# Patient Record
Sex: Female | Born: 1965
Health system: Southern US, Community
[De-identification: ages and names within clinical notes are randomized; demographics above are authoritative.]

## PROBLEM LIST (undated history)

## (undated) DIAGNOSIS — R7303 Prediabetes: Secondary | ICD-10-CM

## (undated) DIAGNOSIS — F319 Bipolar disorder, unspecified: Secondary | ICD-10-CM

## (undated) DIAGNOSIS — C801 Malignant (primary) neoplasm, unspecified: Secondary | ICD-10-CM

## (undated) DIAGNOSIS — F909 Attention-deficit hyperactivity disorder, unspecified type: Secondary | ICD-10-CM

## (undated) DIAGNOSIS — K289 Gastrojejunal ulcer, unspecified as acute or chronic, without hemorrhage or perforation: Secondary | ICD-10-CM

## (undated) DIAGNOSIS — N809 Endometriosis, unspecified: Secondary | ICD-10-CM

## (undated) DIAGNOSIS — C8190 Hodgkin lymphoma, unspecified, unspecified site: Secondary | ICD-10-CM

## (undated) DIAGNOSIS — F32A Depression, unspecified: Secondary | ICD-10-CM

## (undated) DIAGNOSIS — Z9884 Bariatric surgery status: Secondary | ICD-10-CM

## (undated) DIAGNOSIS — F419 Anxiety disorder, unspecified: Secondary | ICD-10-CM

## (undated) DIAGNOSIS — E039 Hypothyroidism, unspecified: Secondary | ICD-10-CM

## (undated) DIAGNOSIS — K219 Gastro-esophageal reflux disease without esophagitis: Secondary | ICD-10-CM

## (undated) DIAGNOSIS — F329 Major depressive disorder, single episode, unspecified: Secondary | ICD-10-CM

## (undated) DIAGNOSIS — F17200 Nicotine dependence, unspecified, uncomplicated: Secondary | ICD-10-CM

## (undated) HISTORY — DX: Anxiety disorder, unspecified: F41.9

## (undated) HISTORY — DX: Endometriosis, unspecified: N80.9

## (undated) HISTORY — PX: CARPAL TUNNEL RELEASE: SHX101

## (undated) HISTORY — PX: TONSILLECTOMY: SUR1361

## (undated) HISTORY — DX: Depression, unspecified: F32.A

## (undated) HISTORY — DX: Hypothyroidism, unspecified: E03.9

## (undated) HISTORY — DX: Attention-deficit hyperactivity disorder, unspecified type: F90.9

## (undated) HISTORY — PX: BUNIONECTOMY: SHX129

## (undated) HISTORY — PX: ABDOMINAL HYSTERECTOMY: SHX81

## (undated) HISTORY — DX: Major depressive disorder, single episode, unspecified: F32.9

## (undated) HISTORY — DX: Bipolar disorder, unspecified: F31.9

## (undated) HISTORY — PX: COLONOSCOPY: SHX174

## (undated) HISTORY — DX: Nicotine dependence, unspecified, uncomplicated: F17.200

## (undated) HISTORY — DX: Hodgkin lymphoma, unspecified, unspecified site: C81.90

## (undated) HISTORY — DX: Bariatric surgery status: Z98.84

## (undated) HISTORY — DX: Gastrojejunal ulcer, unspecified as acute or chronic, without hemorrhage or perforation: K28.9

---

## 1998-06-11 DIAGNOSIS — C8190 Hodgkin lymphoma, unspecified, unspecified site: Secondary | ICD-10-CM

## 1998-06-11 HISTORY — DX: Hodgkin lymphoma, unspecified, unspecified site: C81.90

## 1998-09-10 HISTORY — PX: OTHER SURGICAL HISTORY: SHX169

## 2005-04-12 ENCOUNTER — Other Ambulatory Visit: Admission: RE | Admit: 2005-04-12 | Discharge: 2005-04-12 | Payer: Self-pay | Admitting: Gynecology

## 2006-06-11 HISTORY — PX: LAPAROSCOPIC SUPRACERVICAL HYSTERECTOMY: SUR797

## 2006-07-24 ENCOUNTER — Ambulatory Visit: Payer: Self-pay | Admitting: Internal Medicine

## 2006-07-24 ENCOUNTER — Ambulatory Visit: Payer: Self-pay | Admitting: Family Medicine

## 2006-07-31 ENCOUNTER — Ambulatory Visit: Payer: Self-pay | Admitting: Gastroenterology

## 2006-08-10 ENCOUNTER — Ambulatory Visit: Payer: Self-pay | Admitting: Internal Medicine

## 2006-09-10 ENCOUNTER — Ambulatory Visit: Payer: Self-pay | Admitting: Internal Medicine

## 2006-09-20 ENCOUNTER — Ambulatory Visit: Payer: Self-pay | Admitting: Unknown Physician Specialty

## 2006-09-26 ENCOUNTER — Ambulatory Visit: Payer: Self-pay | Admitting: Unknown Physician Specialty

## 2006-10-10 ENCOUNTER — Ambulatory Visit: Payer: Self-pay | Admitting: Internal Medicine

## 2006-11-08 DIAGNOSIS — R748 Abnormal levels of other serum enzymes: Secondary | ICD-10-CM | POA: Insufficient documentation

## 2006-11-08 DIAGNOSIS — F339 Major depressive disorder, recurrent, unspecified: Secondary | ICD-10-CM | POA: Insufficient documentation

## 2006-11-10 ENCOUNTER — Ambulatory Visit: Payer: Self-pay | Admitting: Internal Medicine

## 2006-11-21 ENCOUNTER — Ambulatory Visit: Payer: Self-pay | Admitting: Internal Medicine

## 2006-12-10 ENCOUNTER — Ambulatory Visit: Payer: Self-pay | Admitting: Internal Medicine

## 2006-12-20 ENCOUNTER — Ambulatory Visit: Payer: Self-pay | Admitting: Internal Medicine

## 2007-01-10 ENCOUNTER — Ambulatory Visit: Payer: Self-pay | Admitting: Internal Medicine

## 2007-01-20 ENCOUNTER — Ambulatory Visit: Payer: Self-pay | Admitting: Gastroenterology

## 2007-03-12 ENCOUNTER — Ambulatory Visit: Payer: Self-pay | Admitting: Internal Medicine

## 2007-03-28 ENCOUNTER — Ambulatory Visit: Payer: Self-pay | Admitting: Internal Medicine

## 2007-04-12 ENCOUNTER — Ambulatory Visit: Payer: Self-pay | Admitting: Internal Medicine

## 2007-05-14 ENCOUNTER — Emergency Department: Payer: Self-pay | Admitting: Internal Medicine

## 2007-06-06 ENCOUNTER — Ambulatory Visit: Payer: Self-pay | Admitting: Family Medicine

## 2007-07-28 ENCOUNTER — Ambulatory Visit: Payer: Self-pay | Admitting: Internal Medicine

## 2007-08-10 ENCOUNTER — Ambulatory Visit: Payer: Self-pay | Admitting: Internal Medicine

## 2007-09-10 ENCOUNTER — Ambulatory Visit: Payer: Self-pay | Admitting: Internal Medicine

## 2007-10-10 ENCOUNTER — Ambulatory Visit: Payer: Self-pay | Admitting: Internal Medicine

## 2007-10-27 ENCOUNTER — Ambulatory Visit: Payer: Self-pay | Admitting: Unknown Physician Specialty

## 2007-10-27 ENCOUNTER — Other Ambulatory Visit: Payer: Self-pay

## 2007-11-06 ENCOUNTER — Ambulatory Visit: Payer: Self-pay | Admitting: Unknown Physician Specialty

## 2007-11-10 ENCOUNTER — Ambulatory Visit: Payer: Self-pay | Admitting: Internal Medicine

## 2007-12-10 ENCOUNTER — Ambulatory Visit: Payer: Self-pay | Admitting: Internal Medicine

## 2007-12-24 ENCOUNTER — Ambulatory Visit: Payer: Self-pay | Admitting: Internal Medicine

## 2007-12-31 ENCOUNTER — Ambulatory Visit: Payer: Self-pay | Admitting: Internal Medicine

## 2008-01-10 ENCOUNTER — Ambulatory Visit: Payer: Self-pay | Admitting: Internal Medicine

## 2008-03-24 DIAGNOSIS — G4700 Insomnia, unspecified: Secondary | ICD-10-CM | POA: Insufficient documentation

## 2008-05-11 ENCOUNTER — Ambulatory Visit: Payer: Self-pay | Admitting: Unknown Physician Specialty

## 2008-12-30 IMAGING — NM NUCLEAR MEDICINE WHOLE BODY BONE SCINTIGRAPHY
2 series · 10 of 10 positions shown · non-contrast
Comparison: none

REASON FOR EXAM: hx Hodgkins lymphoma   elevated alkpnos   eval bone
metastatic disease
COMMENTS:

[Series 1: statics · 2.40mm/px · 4 acquisitions, 8 frames shown]
[im 1/4]
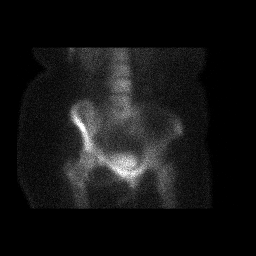
[im 1/4]
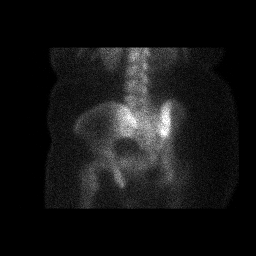
[im 2/4]
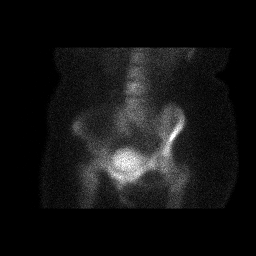
[im 2/4]
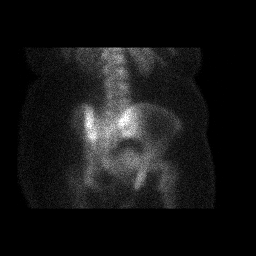
[im 3/4]
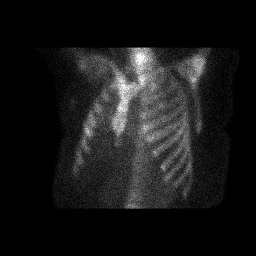
[im 3/4]
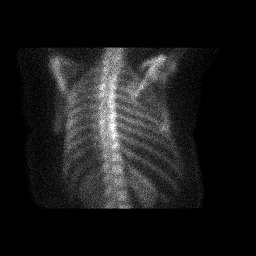
[im 4/4]
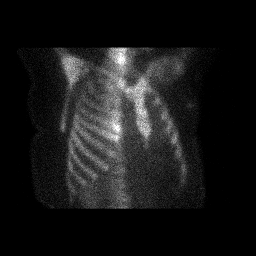
[im 4/4]
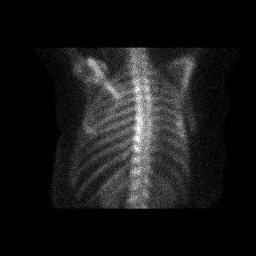

[Series 1: 3 hr wholebody · 2.40mm/px · 2 of 2 frames shown]
[frame 1/2]
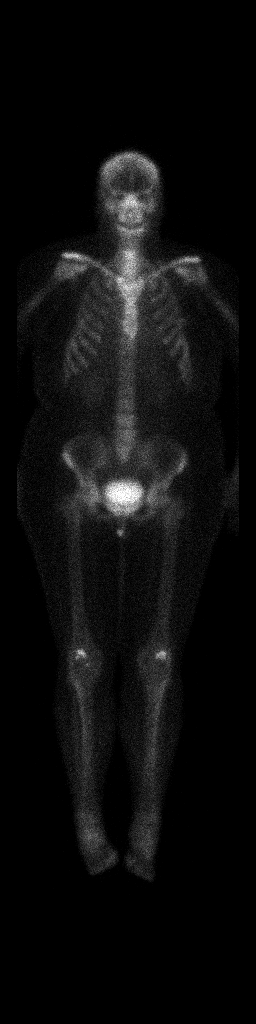
[frame 2/2]
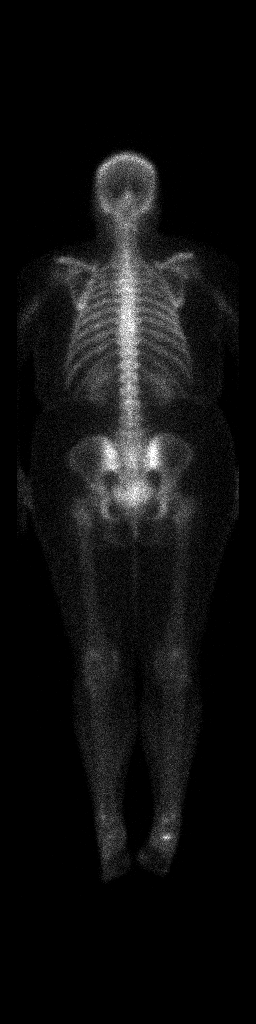

[10 of 10 positions shown; findings below may reference images not displayed]

PROCEDURE:     NM  - NM BONE WB 3 HR [DATE]  [DATE]

RESULT:     The patient received an injection of 22.48 mCi of technetium 99m
MDP for the study. Anterior and posterior images are obtained. Is no
evidence of abnormal localization. Mild degenerative changes are noted in
the ankles, worse on the right, as well as in the knees and shoulders.
IMPRESSION: Mild to degenerative changes. No other abnormal
localization evident.

## 2010-01-27 ENCOUNTER — Emergency Department: Payer: Self-pay | Admitting: Unknown Physician Specialty

## 2010-05-01 ENCOUNTER — Emergency Department: Payer: Self-pay | Admitting: Emergency Medicine

## 2010-06-11 DIAGNOSIS — Z9884 Bariatric surgery status: Secondary | ICD-10-CM

## 2010-06-11 HISTORY — DX: Bariatric surgery status: Z98.84

## 2010-07-19 ENCOUNTER — Ambulatory Visit: Payer: Self-pay | Admitting: General Practice

## 2010-08-17 ENCOUNTER — Ambulatory Visit: Payer: Self-pay | Admitting: Otolaryngology

## 2010-10-28 ENCOUNTER — Other Ambulatory Visit: Payer: Self-pay | Admitting: Physician Assistant

## 2010-11-28 ENCOUNTER — Other Ambulatory Visit: Payer: Self-pay | Admitting: Physician Assistant

## 2010-12-23 ENCOUNTER — Other Ambulatory Visit: Payer: Self-pay | Admitting: General Practice

## 2011-01-23 ENCOUNTER — Emergency Department: Payer: Self-pay | Admitting: Emergency Medicine

## 2011-03-12 HISTORY — PX: GASTRIC BYPASS: SHX52

## 2011-04-18 ENCOUNTER — Emergency Department: Payer: Self-pay

## 2011-07-12 ENCOUNTER — Ambulatory Visit: Payer: Self-pay | Admitting: Specialist

## 2011-07-12 DIAGNOSIS — I10 Essential (primary) hypertension: Secondary | ICD-10-CM

## 2011-07-18 ENCOUNTER — Ambulatory Visit: Payer: Self-pay | Admitting: Specialist

## 2011-08-07 ENCOUNTER — Ambulatory Visit: Payer: Self-pay | Admitting: Specialist

## 2011-10-08 ENCOUNTER — Ambulatory Visit: Payer: Self-pay | Admitting: Bariatrics

## 2011-10-10 ENCOUNTER — Ambulatory Visit: Payer: Self-pay | Admitting: Bariatrics

## 2011-10-24 ENCOUNTER — Ambulatory Visit: Payer: Self-pay | Admitting: Bariatrics

## 2011-10-24 LAB — PROTIME-INR
INR: 1
Prothrombin Time: 13.2 secs (ref 11.5–14.7)

## 2011-10-24 LAB — COMPREHENSIVE METABOLIC PANEL
Albumin: 3.4 g/dL (ref 3.4–5.0)
Alkaline Phosphatase: 172 U/L — ABNORMAL HIGH (ref 50–136)
Anion Gap: 6 — ABNORMAL LOW (ref 7–16)
BUN: 10 mg/dL (ref 7–18)
Bilirubin,Total: 0.5 mg/dL (ref 0.2–1.0)
Calcium, Total: 8.7 mg/dL (ref 8.5–10.1)
Chloride: 104 mmol/L (ref 98–107)
Co2: 28 mmol/L (ref 21–32)
EGFR (Non-African Amer.): >60
Osmolality: 274 (ref 275–301)
SGOT(AST): 23 U/L (ref 15–37)
SGPT (ALT): 32 U/L
Sodium: 138 mmol/L (ref 136–145)

## 2011-10-24 LAB — BILIRUBIN, DIRECT: Bilirubin, Direct: 0.1 mg/dL (ref 0.00–0.20)

## 2011-10-24 LAB — IRON AND TIBC
Iron Saturation: 29 %
Iron: 86 ug/dL (ref 50–170)

## 2011-10-24 LAB — FERRITIN: Ferritin (ARMC): 55 ng/mL (ref 8–388)

## 2011-10-24 LAB — CBC WITH DIFFERENTIAL/PLATELET
Basophil #: 0.1 10*3/uL (ref 0.0–0.1)
HGB: 12.8 g/dL (ref 12.0–16.0)
MCHC: 32.9 g/dL (ref 32.0–36.0)
MCV: 90 fL (ref 80–100)
Neutrophil %: 56.9 %
Platelet: 328 10*3/uL (ref 150–440)
RDW: 13.2 % (ref 11.5–14.5)

## 2011-10-24 LAB — HEMOGLOBIN A1C: Hemoglobin A1C: 6.3 % (ref 4.2–6.3)

## 2011-10-25 LAB — LIPASE, BLOOD: Lipase: 127 U/L (ref 73–393)

## 2011-11-10 ENCOUNTER — Ambulatory Visit: Payer: Self-pay | Admitting: Bariatrics

## 2011-12-10 ENCOUNTER — Ambulatory Visit: Payer: Self-pay | Admitting: Bariatrics

## 2011-12-31 ENCOUNTER — Emergency Department: Payer: Self-pay | Admitting: Emergency Medicine

## 2012-01-02 ENCOUNTER — Ambulatory Visit: Payer: Self-pay | Admitting: Bariatrics

## 2012-01-02 ENCOUNTER — Emergency Department: Payer: Self-pay | Admitting: Emergency Medicine

## 2012-01-10 ENCOUNTER — Ambulatory Visit: Payer: Self-pay | Admitting: Bariatrics

## 2012-02-10 ENCOUNTER — Ambulatory Visit: Payer: Self-pay | Admitting: Bariatrics

## 2012-03-04 ENCOUNTER — Ambulatory Visit: Payer: Self-pay | Admitting: Bariatrics

## 2012-03-04 LAB — BASIC METABOLIC PANEL
Calcium, Total: 9.2 mg/dL (ref 8.5–10.1)
Creatinine: 0.7 mg/dL (ref 0.60–1.30)
EGFR (African American): >60
EGFR (Non-African Amer.): >60
Glucose: 75 mg/dL (ref 65–99)
Osmolality: 274 (ref 275–301)
Sodium: 138 mmol/L (ref 136–145)

## 2012-03-04 LAB — MAGNESIUM: Magnesium: 1.8 mg/dL

## 2012-03-11 ENCOUNTER — Ambulatory Visit: Payer: Self-pay | Admitting: Bariatrics

## 2012-03-11 ENCOUNTER — Inpatient Hospital Stay: Payer: Self-pay | Admitting: Bariatrics

## 2012-03-12 LAB — CBC WITH DIFFERENTIAL/PLATELET
Eosinophil #: 0.1 10*3/uL (ref 0.0–0.7)
Eosinophil %: 0.9 %
HCT: 35.3 % (ref 35.0–47.0)
Lymphocyte #: 4.9 10*3/uL — ABNORMAL HIGH (ref 1.0–3.6)
MCHC: 34.4 g/dL (ref 32.0–36.0)
MCV: 90 fL (ref 80–100)
Monocyte #: 0.7 x10 3/mm (ref 0.2–0.9)
Neutrophil #: 6.4 10*3/uL (ref 1.4–6.5)
Neutrophil %: 53.2 %
Platelet: 254 10*3/uL (ref 150–440)

## 2012-03-12 LAB — BASIC METABOLIC PANEL
BUN: 11 mg/dL (ref 7–18)
Chloride: 109 mmol/L — ABNORMAL HIGH (ref 98–107)
Co2: 24 mmol/L (ref 21–32)
Creatinine: 0.73 mg/dL (ref 0.60–1.30)
Potassium: 3.9 mmol/L (ref 3.5–5.1)
Sodium: 142 mmol/L (ref 136–145)

## 2012-04-11 ENCOUNTER — Ambulatory Visit: Payer: Self-pay | Admitting: Bariatrics

## 2012-04-15 ENCOUNTER — Observation Stay: Payer: Self-pay | Admitting: Bariatrics

## 2012-04-15 LAB — CBC WITH DIFFERENTIAL/PLATELET
Basophil #: 0 10*3/uL (ref 0.0–0.1)
Eosinophil #: 0.2 10*3/uL (ref 0.0–0.7)
HCT: 39.3 % (ref 35.0–47.0)
HGB: 13.5 g/dL (ref 12.0–16.0)
Lymphocyte #: 5.6 10*3/uL — ABNORMAL HIGH (ref 1.0–3.6)
MCH: 31.2 pg (ref 26.0–34.0)
MCHC: 34.4 g/dL (ref 32.0–36.0)
MCV: 91 fL (ref 80–100)
Monocyte #: 0.6 x10 3/mm (ref 0.2–0.9)
Neutrophil #: 5.4 10*3/uL (ref 1.4–6.5)
RDW: 14.1 % (ref 11.5–14.5)

## 2012-04-15 LAB — COMPREHENSIVE METABOLIC PANEL
Alkaline Phosphatase: 147 U/L — ABNORMAL HIGH (ref 50–136)
BUN: 13 mg/dL (ref 7–18)
Bilirubin,Total: 0.5 mg/dL (ref 0.2–1.0)
Calcium, Total: 8.6 mg/dL (ref 8.5–10.1)
Chloride: 105 mmol/L (ref 98–107)
Co2: 27 mmol/L (ref 21–32)
Creatinine: 0.78 mg/dL (ref 0.60–1.30)
EGFR (Non-African Amer.): >60
Osmolality: 280 (ref 275–301)
SGPT (ALT): 34 U/L (ref 12–78)
Sodium: 140 mmol/L (ref 136–145)
Total Protein: 6.9 g/dL (ref 6.4–8.2)

## 2012-04-16 LAB — URINALYSIS, COMPLETE
Bilirubin,UR: NEGATIVE
Ketone: NEGATIVE
Protein: NEGATIVE
RBC,UR: <1 /HPF (ref 0–5)
Squamous Epithelial: 1

## 2012-05-11 ENCOUNTER — Ambulatory Visit: Payer: Self-pay | Admitting: Bariatrics

## 2012-06-05 ENCOUNTER — Ambulatory Visit: Payer: Self-pay | Admitting: Gastroenterology

## 2012-06-06 ENCOUNTER — Emergency Department: Payer: Self-pay | Admitting: Emergency Medicine

## 2012-06-06 LAB — URINALYSIS, COMPLETE
Nitrite: NEGATIVE
Ph: 7 (ref 4.5–8.0)
Protein: NEGATIVE
RBC,UR: 1 /HPF (ref 0–5)
Specific Gravity: 1.014 (ref 1.003–1.030)
Squamous Epithelial: 4

## 2012-06-06 LAB — CBC
HCT: 42.6 % (ref 35.0–47.0)
HGB: 13.6 g/dL (ref 12.0–16.0)
MCH: 29 pg (ref 26.0–34.0)
MCHC: 32 g/dL (ref 32.0–36.0)
MCV: 91 fL (ref 80–100)
RBC: 4.7 10*6/uL (ref 3.80–5.20)
WBC: 7.5 10*3/uL (ref 3.6–11.0)

## 2012-06-06 LAB — DIFFERENTIAL
Eosinophil %: 0.7 %
Lymphocyte #: 0.8 10*3/uL — ABNORMAL LOW (ref 1.0–3.6)
Monocyte %: 3.6 %
Neutrophil #: 6.3 10*3/uL (ref 1.4–6.5)

## 2012-06-06 LAB — COMPREHENSIVE METABOLIC PANEL
Alkaline Phosphatase: 218 U/L — ABNORMAL HIGH (ref 50–136)
Anion Gap: 8 (ref 7–16)
BUN: 9 mg/dL (ref 7–18)
Bilirubin,Total: 0.4 mg/dL (ref 0.2–1.0)
Calcium, Total: 8.6 mg/dL (ref 8.5–10.1)
Chloride: 108 mmol/L — ABNORMAL HIGH (ref 98–107)
Co2: 23 mmol/L (ref 21–32)
Creatinine: 0.77 mg/dL (ref 0.60–1.30)
EGFR (African American): >60
EGFR (Non-African Amer.): >60
Potassium: 3.6 mmol/L (ref 3.5–5.1)
SGPT (ALT): 18 U/L (ref 12–78)

## 2012-06-06 LAB — RAPID INFLUENZA A&B ANTIGENS

## 2012-06-30 ENCOUNTER — Other Ambulatory Visit: Payer: Self-pay | Admitting: Bariatrics

## 2012-06-30 LAB — CBC WITH DIFFERENTIAL/PLATELET
Basophil #: 0 10*3/uL (ref 0.0–0.1)
Basophil %: 0.3 %
Eosinophil #: 0.2 10*3/uL (ref 0.0–0.7)
HCT: 39.5 % (ref 35.0–47.0)
Lymphocyte %: 25.6 %
MCH: 30.5 pg (ref 26.0–34.0)
MCV: 90 fL (ref 80–100)
Monocyte #: 0.6 x10 3/mm (ref 0.2–0.9)
Monocyte %: 6.3 %
Neutrophil #: 6.6 10*3/uL — ABNORMAL HIGH (ref 1.4–6.5)
Neutrophil %: 66.1 %
Platelet: 255 10*3/uL (ref 150–440)
RDW: 13.8 % (ref 11.5–14.5)
WBC: 10 10*3/uL (ref 3.6–11.0)

## 2012-06-30 LAB — COMPREHENSIVE METABOLIC PANEL
Albumin: 3.2 g/dL — ABNORMAL LOW (ref 3.4–5.0)
Alkaline Phosphatase: 237 U/L — ABNORMAL HIGH (ref 50–136)
BUN: 13 mg/dL (ref 7–18)
Chloride: 108 mmol/L — ABNORMAL HIGH (ref 98–107)
Co2: 25 mmol/L (ref 21–32)
Creatinine: 0.73 mg/dL (ref 0.60–1.30)
EGFR (African American): >60
EGFR (Non-African Amer.): >60
Glucose: 86 mg/dL (ref 65–99)
SGOT(AST): 18 U/L (ref 15–37)
SGPT (ALT): 17 U/L (ref 12–78)

## 2012-06-30 LAB — PHOSPHORUS: Phosphorus: 2.5 mg/dL (ref 2.5–4.9)

## 2012-06-30 LAB — MAGNESIUM: Magnesium: 1.7 mg/dL — ABNORMAL LOW

## 2012-06-30 LAB — FOLATE: Folic Acid: 7.1 ng/mL (ref 3.1–100.0)

## 2012-06-30 LAB — FERRITIN: Ferritin (ARMC): 57 ng/mL (ref 8–388)

## 2012-08-21 ENCOUNTER — Ambulatory Visit: Payer: Self-pay | Admitting: Gastroenterology

## 2012-12-03 ENCOUNTER — Other Ambulatory Visit: Payer: Self-pay | Admitting: Bariatrics

## 2012-12-03 LAB — CBC WITH DIFFERENTIAL/PLATELET
Basophil %: 0.4 %
Eosinophil #: 0.2 10*3/uL (ref 0.0–0.7)
HGB: 13.6 g/dL (ref 12.0–16.0)
Lymphocyte #: 3.1 10*3/uL (ref 1.0–3.6)
Lymphocyte %: 41.6 %
MCH: 31.8 pg (ref 26.0–34.0)
MCHC: 35.4 g/dL (ref 32.0–36.0)
MCV: 90 fL (ref 80–100)
Monocyte #: 0.6 x10 3/mm (ref 0.2–0.9)
Monocyte %: 7.8 %
Neutrophil #: 3.6 10*3/uL (ref 1.4–6.5)
Neutrophil %: 47 %
Platelet: 223 10*3/uL (ref 150–440)
RBC: 4.29 10*6/uL (ref 3.80–5.20)
RDW: 13 % (ref 11.5–14.5)
WBC: 7.6 10*3/uL (ref 3.6–11.0)

## 2012-12-03 LAB — COMPREHENSIVE METABOLIC PANEL
Albumin: 3.3 g/dL — ABNORMAL LOW (ref 3.4–5.0)
Alkaline Phosphatase: 233 U/L — ABNORMAL HIGH (ref 50–136)
Anion Gap: 7 (ref 7–16)
Bilirubin,Total: 0.5 mg/dL (ref 0.2–1.0)
Chloride: 108 mmol/L — ABNORMAL HIGH (ref 98–107)
Co2: 26 mmol/L (ref 21–32)
Creatinine: 0.83 mg/dL (ref 0.60–1.30)
EGFR (African American): >60
EGFR (Non-African Amer.): >60
Osmolality: 281 (ref 275–301)
Potassium: 4.1 mmol/L (ref 3.5–5.1)
SGOT(AST): 17 U/L (ref 15–37)
SGPT (ALT): 26 U/L (ref 12–78)
Total Protein: 6.6 g/dL (ref 6.4–8.2)

## 2012-12-03 LAB — RETICULOCYTES
Absolute Retic Count: 0.0374 10*6/uL
Reticulocyte: 0.87 %

## 2012-12-03 LAB — IRON AND TIBC
Iron Bind.Cap.(Total): 260 ug/dL (ref 250–450)
Iron Saturation: 28 %
Iron: 74 ug/dL (ref 50–170)
Unbound Iron-Bind.Cap.: 186 ug/dL

## 2012-12-03 LAB — LIPASE, BLOOD: Lipase: 109 U/L (ref 73–393)

## 2012-12-03 LAB — MAGNESIUM: Magnesium: 1.7 mg/dL — ABNORMAL LOW

## 2012-12-03 LAB — AMYLASE: Amylase: 30 U/L (ref 25–115)

## 2012-12-03 LAB — FOLATE: Folic Acid: 9.4 ng/mL (ref 3.1–100.0)

## 2012-12-03 LAB — FERRITIN: Ferritin (ARMC): 51 ng/mL (ref 8–388)

## 2012-12-21 ENCOUNTER — Emergency Department: Payer: Self-pay | Admitting: Emergency Medicine

## 2012-12-21 LAB — URINALYSIS, COMPLETE
Bilirubin,UR: NEGATIVE
Leukocyte Esterase: NEGATIVE
Nitrite: POSITIVE
Ph: 6 (ref 4.5–8.0)
RBC,UR: 46 /HPF (ref 0–5)
Specific Gravity: 1.018 (ref 1.003–1.030)
Squamous Epithelial: 10
WBC UR: 8 /HPF (ref 0–5)

## 2012-12-22 LAB — URINE CULTURE

## 2012-12-23 ENCOUNTER — Other Ambulatory Visit: Payer: Self-pay | Admitting: Physician Assistant

## 2012-12-24 LAB — URINE CULTURE

## 2013-07-08 ENCOUNTER — Other Ambulatory Visit: Payer: Self-pay | Admitting: Bariatrics

## 2013-07-08 LAB — CBC WITH DIFFERENTIAL/PLATELET
Basophil #: 0 10*3/uL (ref 0.0–0.1)
Basophil %: 0.5 %
EOS ABS: 0.3 10*3/uL (ref 0.0–0.7)
Eosinophil %: 3.1 %
HCT: 40.8 % (ref 35.0–47.0)
HGB: 13.9 g/dL (ref 12.0–16.0)
LYMPHS ABS: 4.6 10*3/uL — AB (ref 1.0–3.6)
Lymphocyte %: 45.8 %
MCH: 32.5 pg (ref 26.0–34.0)
MCHC: 34.1 g/dL (ref 32.0–36.0)
MCV: 95 fL (ref 80–100)
MONO ABS: 0.6 x10 3/mm (ref 0.2–0.9)
Monocyte %: 6.2 %
NEUTROS PCT: 44.4 %
Neutrophil #: 4.4 10*3/uL (ref 1.4–6.5)
Platelet: 207 10*3/uL (ref 150–440)
RBC: 4.29 10*6/uL (ref 3.80–5.20)
RDW: 12.9 % (ref 11.5–14.5)
WBC: 9.9 10*3/uL (ref 3.6–11.0)

## 2013-07-08 LAB — PHOSPHORUS: Phosphorus: 3.9 mg/dL (ref 2.5–4.9)

## 2013-07-08 LAB — COMPREHENSIVE METABOLIC PANEL
ALBUMIN: 3.5 g/dL (ref 3.4–5.0)
AST: 11 U/L — AB (ref 15–37)
Alkaline Phosphatase: 186 U/L — ABNORMAL HIGH
Anion Gap: 4 — ABNORMAL LOW (ref 7–16)
BUN: 13 mg/dL (ref 7–18)
Bilirubin,Total: 0.4 mg/dL (ref 0.2–1.0)
Calcium, Total: 8.4 mg/dL — ABNORMAL LOW (ref 8.5–10.1)
Chloride: 106 mmol/L (ref 98–107)
Co2: 28 mmol/L (ref 21–32)
Creatinine: 0.75 mg/dL (ref 0.60–1.30)
EGFR (African American): >60
Glucose: 103 mg/dL — ABNORMAL HIGH (ref 65–99)
Osmolality: 276 (ref 275–301)
Potassium: 4.3 mmol/L (ref 3.5–5.1)
SGPT (ALT): 22 U/L (ref 12–78)
SODIUM: 138 mmol/L (ref 136–145)
Total Protein: 6.8 g/dL (ref 6.4–8.2)

## 2013-07-08 LAB — MAGNESIUM: MAGNESIUM: 1.7 mg/dL — AB

## 2013-07-08 LAB — LIPID PANEL
Cholesterol: 121 mg/dL (ref 0–200)
HDL: 42 mg/dL (ref 40–60)
LDL CHOLESTEROL, CALC: 53 mg/dL (ref 0–100)
Triglycerides: 132 mg/dL (ref 0–200)
VLDL Cholesterol, Calc: 26 mg/dL (ref 5–40)

## 2013-07-08 LAB — FERRITIN: Ferritin (ARMC): 41 ng/mL (ref 8–388)

## 2013-07-08 LAB — FOLATE: Folic Acid: 29 ng/mL (ref 3.1–100.0)

## 2013-07-08 LAB — AMYLASE: Amylase: 32 U/L (ref 25–115)

## 2014-03-03 ENCOUNTER — Other Ambulatory Visit: Payer: Self-pay | Admitting: Family Medicine

## 2014-03-03 LAB — CBC WITH DIFFERENTIAL/PLATELET
Basophil #: 0.1 10*3/uL (ref 0.0–0.1)
Basophil %: 0.7 %
Eosinophil #: 0.3 10*3/uL (ref 0.0–0.7)
Eosinophil %: 3.2 %
HCT: 44.6 % (ref 35.0–47.0)
HGB: 15 g/dL (ref 12.0–16.0)
Lymphocyte #: 4.3 10*3/uL — ABNORMAL HIGH (ref 1.0–3.6)
Lymphocyte %: 43.3 %
MCH: 32.2 pg (ref 26.0–34.0)
MCHC: 33.6 g/dL (ref 32.0–36.0)
MCV: 96 fL (ref 80–100)
Monocyte #: 0.6 x10 3/mm (ref 0.2–0.9)
Monocyte %: 6.3 %
NEUTROS ABS: 4.6 10*3/uL (ref 1.4–6.5)
Neutrophil %: 46.5 %
Platelet: 257 10*3/uL (ref 150–440)
RBC: 4.65 10*6/uL (ref 3.80–5.20)
RDW: 13.3 % (ref 11.5–14.5)
WBC: 9.9 10*3/uL (ref 3.6–11.0)

## 2014-03-03 LAB — COMPREHENSIVE METABOLIC PANEL
ALT: 28 U/L
Albumin: 3.7 g/dL (ref 3.4–5.0)
Alkaline Phosphatase: 231 U/L — ABNORMAL HIGH
Anion Gap: 5 — ABNORMAL LOW (ref 7–16)
BILIRUBIN TOTAL: 0.5 mg/dL (ref 0.2–1.0)
BUN: 6 mg/dL — ABNORMAL LOW (ref 7–18)
CALCIUM: 8.9 mg/dL (ref 8.5–10.1)
CHLORIDE: 107 mmol/L (ref 98–107)
CREATININE: 0.73 mg/dL (ref 0.60–1.30)
Co2: 29 mmol/L (ref 21–32)
EGFR (African American): >60
EGFR (Non-African Amer.): >60
GLUCOSE: 87 mg/dL (ref 65–99)
Osmolality: 278 (ref 275–301)
Potassium: 4.6 mmol/L (ref 3.5–5.1)
SGOT(AST): 38 U/L — ABNORMAL HIGH (ref 15–37)
SODIUM: 141 mmol/L (ref 136–145)
Total Protein: 7.6 g/dL (ref 6.4–8.2)

## 2014-05-02 ENCOUNTER — Emergency Department: Payer: Self-pay | Admitting: Internal Medicine

## 2014-09-28 NOTE — Consult Note (Signed)
Brief Consult Note: Diagnosis: nausea, vomiting epigastric pain.   Patient was seen by consultant.   Comments: Patient seen and examined, chart reviewed, full consult to follow.  Patietn presenting as direct admit from Dr Duke Salvia, 5 weeks post Roux-en-Y gastric bypass.  Patient relates epigastric pain, then nausea, then emesis after eating limited solids.  No dysphagia symptoms in terms of actual swallowing.  Will discuss further with Dr Duke Salvia tomorrow.  Will allow limited clears.  Electronic Signatures: Loistine Simas (MD)  (Signed [phone] 20:39)  Authored: Brief Consult Note   Last Updated: 05-Nov-13 20:39 by Loistine Simas (MD)

## 2014-09-28 NOTE — Op Note (Signed)
PATIENT NAME:  Jessica [name], Jessica [name] MR#:  [id number] DATE OF BIRTH:  1966-05-23  DATE OF PROCEDURE:  03/11/2012  SURGEON: Legrand Como A. Duke Salvia, MD  ASSISTANT: Darrin Luis, PA.   PROCEDURE PERFORMED: Laparoscopic gastric bypass with intraoperative endoscopy.   PREOPERATIVE DIAGNOSIS: Morbid obesity with BMI of 43 associated with hypertension and hyperlipidemia.   POSTOPERATIVE DIAGNOSIS: Morbid obesity with BMI of 43 associated with hypertension and hyperlipidemia.   DESCRIPTION OF PROCEDURE: The patient was brought to the Operating Room, placed in supine position. General anesthesia obtained with orotracheal intubation. The patient had a Foley catheter inserted sterilely. TED hose and Thromboguards applied and a foot board applied at the end of the operative bed. The abdomen and lower chest sterilely prepped and draped. 5 mm Optiview trocar introduced in the left upper quadrant under direct visualization. Pneumoperitoneum obtained with carbon dioxide. Four additional trocars were introduced across the upper abdomen. The omentum elevated out of the lower abdomen and the ligament of Treitz identified. The bowel was followed distally 50 cm at which point it was divided with a white load GI stapler. The arcade vessels divided by use of the Harmonic scalpel. The bowel was followed distally an additional 100 cm at which point a side-to-side anastomosis configured. This was accomplished with enterotomies on the antimesenteric border of the biliary limb and the common channel portion of jejunum. A white load stapler used to create a common lumen and the resulting enterotomy closed with a repeat firing of white load GI stapler. Anti- torsion sutures placed distal to anastomosis and the mesenteric window closed with a running 2-0 Polysorb suture. The transverse colon omentum divided and the mobilized Roux limb was positioned on the anterior stomach wall. A Nathanson liver retractor introduced through a subxiphoid  wound. Left lobe of the liver elevated. There was no evidence of a hiatal hernia on initial evaluation consistent with preoperative upper GI series. The fat pad associated with the angle of His and the upper fundus freed from the undersurface of the left hemidiaphragm, portions of the fat pad excised. 5 cm inferior to the GE junction, the arcade vessels divided by use of the Harmonic scalpel and a proximal gastric pouch created by combination of gold load staplers reinforced with seam guard. The patient had transverse firing of a load of staples followed by vertical line of staples brought out just lateral to the angle of His. Next, a distal posterior gastrojejunal anastomosis configured. Enterotomies were made on both the distal posterior aspect of the stomach and the antimesenteric border of the mobilized Roux limb. A small candy cane was created an effort to reduce tension on the mesentery. A blue load stapler used to create a common lumen and the resulting enterotomy closed with a running 2-0 Polysorb suture. Both the suture and staple lines were then reinforced with an additional running 2-0 Ethibond suture. The redundant portion of the candy cane was excised at this point after mobilization of the bowel away from its mesentery. This portion of jejunum delivered by way of a specimen bag. The patient had intraoperative endoscopy performed at this point. There was noted to be no evidence of an air leak in the region of the anastomosis which had been sized by way of a 34 French bougie. The staple line revealed no evidence of bleeding. The stomach and jejunum decompressed. The scope withdrawn. The divided omentum positioned over the area of the anastomosis and secured to the stomach with an Ethibond suture. The Terance Hart defect was closed with  a running 2-0 Ethibond suture. At this point, the distal jejunal and jejunal anastomosis inspected and found to have excellent hemostasis. The pneumoperitoneum relieved. The  trocars removed. The wounds were injected with 0.25% Marcaine followed by 4-0 Monocryl in the dermis followed by Dermabond. The patient tolerated the procedure well with minimal blood loss.    ____________________________ Venia Carbon Duke Salvia, MD mat:cms D: 03/12/2012 08:21:29 ET T: 03/12/2012 09:30:18 ET JOB#: [id number]  cc: Legrand Como A. Duke Salvia, MD, <Dictator> Bethena Roys. Ancil Boozer, MD Ladora Daniel MD ELECTRONICALLY SIGNED 03/25/2012 11:52

## 2014-09-28 NOTE — H&P (Signed)
PATIENT NAME:  Jessica [name], Jessica [name] MR#:  [id number] DATE OF BIRTH:  1965/06/17  DATE OF ADMISSION:  04/15/2012  ADMITTING DIAGNOSIS: Solid food dysphagia, status post gastric bypass.  HISTORY OF PRESENT ILLNESS: The patient is a 49 year old female who was admitted yesterday for issues associated with progressive solid food dysphagia six weeks status post gastric bypass. The patient initially did well with liquids and at time of diet advance four weeks after surgery the patient noted persistent difficulty with swallowing solids with a sense of pressure. On the day of admission, the patient stated even thick broth resulted in a need for vomiting. There has been no hematemesis, melena, or hematochezia. No cough or sputum production. No true chest pain. No significant abdominal pain. The patient has not been using anti-inflammatory agents. She does not smoke. The patient has lost approximately 45 pounds since initiating surgical planning and pursuing postoperative liquids. Her energy is well preserved. She has no muscle weakness or neurologic compromise. She has been able to void well with normal coloration of her urine. No dysuria or hematuria reported.   PHYSICAL EXAMINATION: At the time of admission the patient was noted to be hemodynamically stable. Temperature 98.4, pulse 84, blood pressure 122/80.   LABORATORY DATA: Normal electrolyte status with BUN of 13, white count 11.9, hemoglobin 13.5.   I should note the patient was given oral prednisone in a liquid form which she used for five days without clinical improvement. The patient was admitted for IV hydration and IV Solu-Medrol presuming that part of this issue is edema in the area of anastomosis, however, at six weeks this is less noticeable and brings a question whether she has developed some mild stenosis in the region of the gastrojejunal anastomosis. The plan would include GI consultation for possible balloon dilation of the gastrojejunal  anastomosis.   ____________________________ Venia Carbon Duke Salvia, MD mat:drc D: 04/16/2012 08:42:42 ET T: 04/16/2012 09:25:02 ET JOB#: [id number]  cc: Legrand Como A. Duke Salvia, MD, <Dictator> Ladora Daniel MD ELECTRONICALLY SIGNED 04/29/2012 7:27

## 2014-10-03 NOTE — Op Note (Signed)
PATIENT NAME:  Jessica [name], Jessica [name] MR#:  [id number] DATE OF BIRTH:  07-18-1965  DATE OF PROCEDURE:  07/18/2011  PREOPERATIVE DIAGNOSIS: Median and ulnar nerve impingement, left wrist.   POSTOPERATIVE DIAGNOSIS: Median and ulnar nerve impingement, left wrist.   PROCEDURES:  1. Left carpal tunnel release. 2. Left release of ulnar nerve at Guyon's canal.   SURGEON: Lucas Mallow, MD    ANESTHESIA: General.   COMPLICATIONS: None.   TOURNIQUET TIME: Approximately 25 minutes.   PROCEDURE: After adequate induction of general anesthesia, the left upper extremity is thoroughly prepped with alcohol and ChloraPrep and draped in standard sterile fashion. The extremity is wrapped out with the Esmarch bandage and pneumatic tourniquet elevated to 250 mmHg. Standard volar curved carpal tunnel and ulnar release incision is made in the palm. This does not across the wrist crease. The dissection is carefully carried down to the hook of the hamate and retractors are placed. Periosteal elevator is first used to reveal the transverse retinacular ligament over the median nerve. This is incised in the midportion. The distal release is performed with the small scissors. The proximal release is performed with the small scissors and the carpal tunnel scissors. There is seen to be moderate to severe compression of the nerve directly beneath the ligament. Attention is then turned to Guyon's canal and under direct vision Guyon's canal is released and proximal and distal release is completely performed. The wound is thoroughly irrigated multiple times. Skin edges are infiltrated with 0.5% plain Marcaine. Skin is closed with 4-0 nylon. Soft bulky dressing is applied. Tourniquet is released.      The patient is returned to the recovery room in satisfactory condition having tolerated the procedure quite well.   ____________________________ Lucas Mallow, MD ces:drc D: 07/18/2011 10:12:35 ET T: 07/18/2011  10:30:44 ET JOB#: [id number]  cc: Lucas Mallow, MD, <Dictator> Lucas Mallow MD ELECTRONICALLY SIGNED 07/19/2011 13:46

## 2014-10-03 NOTE — Op Note (Signed)
PATIENT NAME:  Jessica [name], STRUSS MR#:  [id number] DATE OF BIRTH:  1965/09/07  DATE OF PROCEDURE:  08/07/2011  PREOPERATIVE DIAGNOSIS:  1. Right carpal tunnel syndrome.  2. Ulnar nerve impingement, right wrist.   POSTOPERATIVE DIAGNOSIS:  1. Right carpal tunnel syndrome. 2. Ulnar nerve impingement, right wrist.  PROCEDURE PERFORMED:   1. Right median nerve release, right wrist.  2. Ulnar nerve release, right wrist.   SURGEON: Christophe Louis, MD   ANESTHESIA: General.   COMPLICATIONS: None.   TOURNIQUET TIME: Approximately 40 minutes.   PROCEDURE: After adequate induction of general anesthesia, the right upper extremity was thoroughly prepped with alcohol and ChloraPrep and draped in standard sterile fashion. The extremity was wrapped out with the Esmarch bandage and pneumatic tourniquet elevated to 250 mmHg. Under loupe magnification, a standard curved volar incision is made in the distal palm and the dissection carried down to the hook of the hamate. Attention is then turned ulnarly where the ulnar nerve and vessels are identified and Guyon's canal was completely released. Attention was then turned medially where the transverse retinacular ligament is identified. This is incised in the midportion with the knife. The distal release is performed with the small scissors. The proximal release is performed with the small scissors and the carpal tunnel scissors. The median nerve is moderately compressed beneath the ligament. Careful check is made both proximally and distally to ensure that complete release had been obtained. The wound is thoroughly irrigated multiple times. Skin edges are infiltrated with 0.5% plain Marcaine. The skin is closed with 4-0 nylon. A soft bulky dressing is applied. The tourniquet is released.  The patient is returned to the recovery room in satisfactory condition having tolerated the procedure quite well.    ____________________________ Lucas Mallow,  MD ces:cbb D: 08/07/2011 10:01:49 ET T: 08/07/2011 12:09:15 ET JOB#: [id number]  cc: Lucas Mallow, MD, <Dictator> Lucas Mallow MD ELECTRONICALLY SIGNED 08/08/2011 20:38

## 2014-10-29 ENCOUNTER — Other Ambulatory Visit
Admission: RE | Admit: 2014-10-29 | Discharge: 2014-10-29 | Disposition: A | Discharge status: Home / Self Care | Payer: 59 | Source: Ambulatory Visit | Attending: Family Medicine | Admitting: Family Medicine

## 2014-10-29 DIAGNOSIS — C8190 Hodgkin lymphoma, unspecified, unspecified site: Secondary | ICD-10-CM | POA: Diagnosis not present

## 2014-10-29 DIAGNOSIS — Z13220 Encounter for screening for lipoid disorders: Secondary | ICD-10-CM | POA: Diagnosis not present

## 2014-10-29 DIAGNOSIS — Z9884 Bariatric surgery status: Secondary | ICD-10-CM | POA: Insufficient documentation

## 2014-10-29 LAB — CBC WITH DIFFERENTIAL/PLATELET
Basophils Absolute: 0 10*3/uL (ref 0–0.1)
Basophils Relative: 1 %
Eosinophils Absolute: 0.4 10*3/uL (ref 0–0.7)
Eosinophils Relative: 5 %
HCT: 41.4 % (ref 35.0–47.0)
Hemoglobin: 13.7 g/dL (ref 12.0–16.0)
LYMPHS ABS: 2.7 10*3/uL (ref 1.0–3.6)
LYMPHS PCT: 35 %
MCH: 31.2 pg (ref 26.0–34.0)
MCHC: 33.1 g/dL (ref 32.0–36.0)
MCV: 94.4 fL (ref 80.0–100.0)
Monocytes Absolute: 0.5 10*3/uL (ref 0.2–0.9)
Monocytes Relative: 7 %
Neutro Abs: 4 10*3/uL (ref 1.4–6.5)
Neutrophils Relative %: 52 %
Platelets: 254 10*3/uL (ref 150–440)
RBC: 4.38 MIL/uL (ref 3.80–5.20)
RDW: 12.6 % (ref 11.5–14.5)
WBC: 7.7 10*3/uL (ref 3.6–11.0)

## 2014-10-29 LAB — COMPREHENSIVE METABOLIC PANEL
ALT: 18 U/L (ref 14–54)
AST: 22 U/L (ref 15–41)
Albumin: 4 g/dL (ref 3.5–5.0)
Alkaline Phosphatase: 168 U/L — ABNORMAL HIGH (ref 38–126)
Anion gap: 7 (ref 5–15)
BUN: 12 mg/dL (ref 6–20)
CO2: 27 mmol/L (ref 22–32)
CREATININE: 0.77 mg/dL (ref 0.44–1.00)
Calcium: 8.9 mg/dL (ref 8.9–10.3)
Chloride: 105 mmol/L (ref 101–111)
GFR calc Af Amer: >60 mL/min (ref >60)
GLUCOSE: 94 mg/dL (ref 65–99)
Potassium: 4.1 mmol/L (ref 3.5–5.1)
Sodium: 139 mmol/L (ref 135–145)
Total Bilirubin: 0.4 mg/dL (ref 0.3–1.2)
Total Protein: 7.3 g/dL (ref 6.5–8.1)

## 2014-10-29 LAB — SEDIMENTATION RATE: SED RATE: 15 mm/h (ref 0–20)

## 2014-10-29 LAB — FERRITIN: Ferritin: 21 ng/mL (ref 11–307)

## 2014-10-29 LAB — FOLATE: Folate: 21.6 ng/mL (ref >5.9)

## 2014-10-29 LAB — LIPID PANEL
Cholesterol: 144 mg/dL (ref 0–200)
HDL: 49 mg/dL (ref >40)
LDL CALC: 77 mg/dL (ref 0–99)
TRIGLYCERIDES: 91 mg/dL (ref <150)
Total CHOL/HDL Ratio: 2.9 RATIO
VLDL: 18 mg/dL (ref 0–40)

## 2014-10-29 LAB — VITAMIN B12: Vitamin B-12: 195 pg/mL (ref 180–914)

## 2014-11-03 LAB — VITAMIN D 1,25 DIHYDROXY
VITAMIN D 1, 25 (OH) TOTAL: 85 pg/mL
VITAMIN D3 1, 25 (OH): 82 pg/mL

## 2014-11-26 ENCOUNTER — Other Ambulatory Visit: Payer: Self-pay | Admitting: Family Medicine

## 2014-11-26 NOTE — Telephone Encounter (Signed)
Patient requesting refill. 

## 2014-11-30 ENCOUNTER — Ambulatory Visit: Payer: Self-pay | Admitting: Family Medicine

## 2014-12-01 ENCOUNTER — Encounter: Payer: Self-pay | Admitting: Family Medicine

## 2014-12-01 ENCOUNTER — Ambulatory Visit (INDEPENDENT_AMBULATORY_CARE_PROVIDER_SITE_OTHER): Payer: 59 | Admitting: Family Medicine

## 2014-12-01 VITALS — BP 124/82 | HR 101 | Temp 98.8°F | Resp 14 | Ht 65.0 in | Wt 153.7 lb

## 2014-12-01 DIAGNOSIS — C8190 Hodgkin lymphoma, unspecified, unspecified site: Secondary | ICD-10-CM | POA: Insufficient documentation

## 2014-12-01 DIAGNOSIS — F411 Generalized anxiety disorder: Secondary | ICD-10-CM | POA: Insufficient documentation

## 2014-12-01 DIAGNOSIS — M7551 Bursitis of right shoulder: Secondary | ICD-10-CM | POA: Diagnosis not present

## 2014-12-01 DIAGNOSIS — Z8639 Personal history of other endocrine, nutritional and metabolic disease: Secondary | ICD-10-CM | POA: Insufficient documentation

## 2014-12-01 DIAGNOSIS — Z8679 Personal history of other diseases of the circulatory system: Secondary | ICD-10-CM | POA: Insufficient documentation

## 2014-12-01 DIAGNOSIS — E538 Deficiency of other specified B group vitamins: Secondary | ICD-10-CM

## 2014-12-01 DIAGNOSIS — K219 Gastro-esophageal reflux disease without esophagitis: Secondary | ICD-10-CM | POA: Insufficient documentation

## 2014-12-01 DIAGNOSIS — Z9884 Bariatric surgery status: Secondary | ICD-10-CM | POA: Insufficient documentation

## 2014-12-01 DIAGNOSIS — Z720 Tobacco use: Secondary | ICD-10-CM | POA: Insufficient documentation

## 2014-12-01 MED ORDER — HYDROCODONE-ACETAMINOPHEN 5-325 MG PO TABS: 2.0000 | ORAL_TABLET | Freq: Two times a day (BID) | ORAL | Status: DC

## 2014-12-01 MED ORDER — LIDOCAINE HCL (PF) 1 % IJ SOLN
2.0000 mL | Freq: Once | INTRAMUSCULAR | Status: AC
  Administered 2014-12-01: 2 mL via INTRADERMAL

## 2014-12-01 MED ORDER — CYANOCOBALAMIN 1000 MCG/ML IJ SOLN
1000.0000 ug | Freq: Once | INTRAMUSCULAR | Status: AC
  Administered 2014-12-01: 1000 ug via INTRAMUSCULAR

## 2014-12-01 MED ORDER — TRIAMCINOLONE ACETONIDE 40 MG/ML IJ SUSP
40.0000 mg | Freq: Once | INTRAMUSCULAR | Status: AC
  Administered 2014-12-01: 40 mg via INTRAMUSCULAR

## 2014-12-01 NOTE — Progress Notes (Signed)
Name: Jessica [name]   MRN: [id number]    DOB: 07/09/1965   Date:12/01/2014       Progress Note  Subjective  Chief Complaint  Chief Complaint  Patient presents with  . Arm Pain    right arm at top difficulty with ROM started monday    HPI  Right shoulder pain: she was playing corn holes this past weekend, all day, and one day later developed acute pain on right shoulder, initially aching and laterally, but now the right shoulder is very tender, decrease rom, pain at this time is 7/10.  Taking nsaid's without help. It is affecting her sleep and also her job. She has been also using heating pad and ice without help.  Vitamin B12 deficiency: she started on B12 shots last month, states she noticed an improvement of her symptoms, but still feels tired.   Patient Active Problem List   Diagnosis Date Noted  . Attention and concentration deficit 12/01/2014  . B12 deficiency 12/01/2014  . H/O surgical procedure 12/01/2014  . Anxiety, generalized 12/01/2014  . Gastro-esophageal reflux disease without esophagitis 12/01/2014  . H/O elevated lipids 12/01/2014  . H/O: HTN (hypertension) 12/01/2014  . H/O: obesity 12/01/2014  . Hodgkin's disease in remission 12/01/2014  . Current tobacco use 12/01/2014  . Cannot sleep 03/24/2008  . Abnormal serum level of alkaline phosphatase 11/08/2006  . Chronic recurrent major depressive disorder 11/08/2006    History  Substance Use Topics  . Smoking status: Current Every Day Smoker -- 12.00 packs/day    Types: Cigarettes    Start date: 06/11/2000  . Smokeless tobacco: Not on file  . Alcohol Use: No     Current outpatient prescriptions:  .  ALPRAZolam (XANAX) 0.5 MG tablet, TAKE 1 TABLET BY MOUTH TWICE DAILY AS NEEDED, Disp: 60 tablet, Rfl: 0 .  DULoxetine (CYMBALTA) 60 MG capsule, Take 1 capsule by mouth daily., Disp: , Rfl:  .  omeprazole (PRILOSEC) 10 MG capsule, Take 1 capsule by mouth daily., Disp: , Rfl:  .  QUEtiapine (SEROQUEL) 25 MG  tablet, Take 1 tablet by mouth daily., Disp: , Rfl:   No Known Allergies  ROS  Constitutional: Negative for fever or weight change.  Respiratory: Negative for cough and shortness of breath.   Cardiovascular: Negative for chest pain or palpitations.  Gastrointestinal: Negative for abdominal pain, no bowel changes.  Musculoskeletal: Negative for gait problem , severe right shoulder pain  Skin: Negative for rash.  Neurological: Negative for dizziness or headache.  No other specific complaints in a complete review of systems (except as listed in HPI above).   Objective  Filed Vitals:   12/01/14 0949  BP: 124/82  Pulse: 101  Temp: 98.8 F (37.1 C)  TempSrc: Oral  Resp: 14  Height: 5' 5" (1.651 m)  Weight: 153 lb 11.2 oz (69.718 kg)  SpO2: 98%    Body mass index is 25.58 kg/(m^2).    Physical Exam  Constitutional: Patient appears well-developed and well-nourished. No distress.  Eyes:  No scleral icterus. PERL Neck: Normal range of motion. Neck supple. Cardiovascular: Normal rate, regular rhythm and normal heart sounds.  No murmur heard. No BLE edema. Pulmonary/Chest: Effort normal and breath sounds normal. No respiratory distress. Abdominal: Soft.  There is no tenderness. Psychiatric: Patient has a normal mood and affect. behavior is normal. Judgment and thought content normal. Muscular skeletal: pain during palpation of right shoulder, no neck symptoms, decrease in rom, worse pain is on lateral aspect of right shoulder ,   over bursa  Recent Results (from the past 2160 hour(s))  Sedimentation rate     Status: None   Collection Time: 10/29/14  7:17 AM  Result Value Ref Range   Sed Rate 15 0 - 20 mm/hr  Vitamin D 1,25 dihydroxy     Status: None   Collection Time: 10/29/14  7:17 AM  Result Value Ref Range   Vitamin D 1, 25 (OH)2 Total 85 pg/mL    Comment: (NOTE) Reference Range: Adults: 21 - 65    Vitamin D3 1, 25 (OH)2 82 pg/mL    Comment: (NOTE) Performed At: [facility] [place], [place] [id number] Pepkowitz Sheral Apley MD [id number]    Vitamin D2 1, 25 (OH)2 <10 pg/mL  Ferritin     Status: None   Collection Time: 10/29/14  7:17 AM  Result Value Ref Range   Ferritin 21 11 - 307 ng/mL  Lipid panel     Status: None   Collection Time: 10/29/14  7:17 AM  Result Value Ref Range   Cholesterol 144 0 - 200 mg/dL   Triglycerides 91 <150 mg/dL   HDL 49 >40 mg/dL   Total CHOL/HDL Ratio 2.9 RATIO   VLDL 18 0 - 40 mg/dL   LDL Cholesterol 77 0 - 99 mg/dL    Comment:        Total Cholesterol/HDL:CHD Risk Coronary Heart Disease Risk Table                     Men   Women  1/2 Average Risk   3.4   3.3  Average Risk       5.0   4.4  2 X Average Risk   9.6   7.1  3 X Average Risk  23.4   11.0        Use the calculated Patient Ratio above and the CHD Risk Table to determine the patient's CHD Risk.        ATP III CLASSIFICATION (LDL):  <100     mg/dL   Optimal  100-129  mg/dL   Near or Above                    Optimal  130-159  mg/dL   Borderline  160-189  mg/dL   High  >190     mg/dL   Very High   Comprehensive metabolic panel     Status: Abnormal   Collection Time: 10/29/14  7:17 AM  Result Value Ref Range   Sodium 139 135 - 145 mmol/L   Potassium 4.1 3.5 - 5.1 mmol/L   Chloride 105 101 - 111 mmol/L   CO2 27 22 - 32 mmol/L   Glucose, Bld 94 65 - 99 mg/dL   BUN 12 6 - 20 mg/dL   Creatinine, Ser 0.77 0.44 - 1.00 mg/dL   Calcium 8.9 8.9 - 10.3 mg/dL   Total Protein 7.3 6.5 - 8.1 g/dL   Albumin 4.0 3.5 - 5.0 g/dL   AST 22 15 - 41 U/L   ALT 18 14 - 54 U/L   Alkaline Phosphatase 168 (H) 38 - 126 U/L   Total Bilirubin 0.4 0.3 - 1.2 mg/dL   GFR calc non Af Amer >60 >60 mL/min   GFR calc Af Amer >60 >60 mL/min    Comment: (NOTE) The eGFR has been calculated using the CKD EPI equation. This calculation has not been validated in all clinical situations. eGFR's persistently <60  mL/min signify possible Chronic  Kidney Disease.    Anion gap 7 5 - 15  CBC with Differential/Platelet     Status: None   Collection Time: 10/29/14  7:17 AM  Result Value Ref Range   WBC 7.7 3.6 - 11.0 K/uL   RBC 4.38 3.80 - 5.20 MIL/uL   Hemoglobin 13.7 12.0 - 16.0 g/dL   HCT 41.4 35.0 - 47.0 %   MCV 94.4 80.0 - 100.0 fL   MCH 31.2 26.0 - 34.0 pg   MCHC 33.1 32.0 - 36.0 g/dL   RDW 12.6 11.5 - 14.5 %   Platelets 254 150 - 440 K/uL   Neutrophils Relative % 52 %   Neutro Abs 4.0 1.4 - 6.5 K/uL   Lymphocytes Relative 35 %   Lymphs Abs 2.7 1.0 - 3.6 K/uL   Monocytes Relative 7 %   Monocytes Absolute 0.5 0.2 - 0.9 K/uL   Eosinophils Relative 5 %   Eosinophils Absolute 0.4 0 - 0.7 K/uL   Basophils Relative 1 %   Basophils Absolute 0.0 0 - 0.1 K/uL  Vitamin B12     Status: None   Collection Time: 10/29/14  7:17 AM  Result Value Ref Range   Vitamin B-12 195 180 - 914 pg/mL    Comment: (NOTE) This assay is not validated for testing neonatal or myeloproliferative syndrome specimens for Vitamin B12 levels. Performed at [facility]   Folate     Status: None   Collection Time: 10/29/14  7:17 AM  Result Value Ref Range   Folate 21.6 >5.9 ng/mL     Assessment & Plan  1. Acute bursitis of right shoulder Procedure:right shoulder injection Location: Lateral  approach Equipment used: 22 guage 1 inch needle Medication: 1 mL Kenalog (40 mg/mL) Anesthesia:  1% Lidocaine w/o Epinephrine  Cleaned and prepped: Betadine  The risks, benefits, treatment options discussed with patient prior to procedure.  Consent signed. Sterile equipment and gloves worn. Area cleansed with sterile betadine.  Injected total treatment solution of 2 mL at pertinent shoulder.   Patient tolerated procedure well with no complications and no excessive bleeding. Bandage placed at site of injection. Patient instructed on potential for steroid reaction pain within the initial 24-48 hr period. Encouraged the use of ice packs directly on  injected site as needed.  2. Vitamin B12 deficiency (non anemic) Continue monthly injection - cyanocobalamin ((VITAMIN B-12)) injection 1,000 mcg; Inject 1 mL (1,000 mcg total) into the muscle once.       

## 2014-12-01 NOTE — Patient Instructions (Signed)
Bursitis °Bursitis is a swelling and soreness (inflammation) of a fluid-filled sac (bursa) that overlies and protects a joint. It can be caused by injury, overuse of the joint, arthritis or infection. The joints most likely to be affected are the elbows, shoulders, hips and knees. °HOME CARE INSTRUCTIONS  °· Apply ice to the affected area for 15-20 minutes each hour while awake for 2 days. Put the ice in a plastic bag and place a towel between the bag of ice and your skin. °· Rest the injured joint as much as possible, but continue to put the joint through a full range of motion, 4 times per day. (The shoulder joint especially becomes rapidly "frozen" if not used.) When the pain lessens, begin normal slow movements and usual activities. °· Only take over-the-counter or prescription medicines for pain, discomfort or fever as directed by your caregiver. °· Your caregiver may recommend draining the bursa and injecting medicine into the bursa. This may help the healing process. °· Follow all instructions for follow-up with your caregiver. This includes any orthopedic referrals, physical therapy and rehabilitation. Any delay in obtaining necessary care could result in a delay or failure of the bursitis to heal and chronic pain. °SEEK IMMEDIATE MEDICAL CARE IF:  °· Your pain increases even during treatment. °· You develop an oral temperature above 102° F (38.9° C) and have heat and inflammation over the involved bursa. °MAKE SURE YOU:  °· Understand these instructions. °· Will watch your condition. °· Will get help right away if you are not doing well or get worse. °Document Released: 05/25/2000 Document Revised: 08/20/2011 Document Reviewed: 08/17/2013 °[employer]® Patient Information ©2015 [employer]. This information is not intended to replace advice given to you by your health care provider. Make sure you discuss any questions you have with your health care provider. ° °

## 2014-12-02 ENCOUNTER — Ambulatory Visit: Payer: Self-pay

## 2015-01-19 ENCOUNTER — Ambulatory Visit (INDEPENDENT_AMBULATORY_CARE_PROVIDER_SITE_OTHER): Payer: 59 | Admitting: Family Medicine

## 2015-01-19 ENCOUNTER — Encounter: Payer: Self-pay | Admitting: Family Medicine

## 2015-01-19 VITALS — BP 128/76 | HR 105 | Temp 99.1°F | Resp 18 | Ht 65.0 in | Wt 152.7 lb

## 2015-01-19 DIAGNOSIS — R232 Flushing: Secondary | ICD-10-CM

## 2015-01-19 DIAGNOSIS — L989 Disorder of the skin and subcutaneous tissue, unspecified: Secondary | ICD-10-CM

## 2015-01-19 DIAGNOSIS — G4700 Insomnia, unspecified: Secondary | ICD-10-CM

## 2015-01-19 DIAGNOSIS — E538 Deficiency of other specified B group vitamins: Secondary | ICD-10-CM | POA: Diagnosis not present

## 2015-01-19 DIAGNOSIS — F411 Generalized anxiety disorder: Secondary | ICD-10-CM

## 2015-01-19 DIAGNOSIS — K219 Gastro-esophageal reflux disease without esophagitis: Secondary | ICD-10-CM

## 2015-01-19 DIAGNOSIS — F339 Major depressive disorder, recurrent, unspecified: Secondary | ICD-10-CM

## 2015-01-19 MED ORDER — DULOXETINE HCL 60 MG PO CPEP: 60.0000 mg | ORAL_CAPSULE | Freq: Every day | ORAL | Status: DC

## 2015-01-19 MED ORDER — QUETIAPINE FUMARATE 25 MG PO TABS: 25.0000 mg | ORAL_TABLET | Freq: Every day | ORAL | Status: DC

## 2015-01-19 MED ORDER — LIDOCAINE HCL (PF) 1 % IJ SOLN: 4.0000 mL | Freq: Once | INTRAMUSCULAR | Status: DC

## 2015-01-19 MED ORDER — CYANOCOBALAMIN 1000 MCG/ML IJ SOLN
1000.0000 ug | Freq: Once | INTRAMUSCULAR | Status: AC
  Administered 2015-01-19: 1000 ug via INTRAMUSCULAR

## 2015-01-19 MED ORDER — LIDOCAINE-EPINEPHRINE (PF) 1 %-1:200000 IJ SOLN
10.0000 mL | Freq: Once | INTRAMUSCULAR | Status: AC
  Administered 2015-01-19: 10 mL via INTRADERMAL

## 2015-01-19 MED ORDER — CLONIDINE HCL 0.1 MG PO TABS: 0.1000 mg | ORAL_TABLET | Freq: Three times a day (TID) | ORAL | Status: DC

## 2015-01-19 MED ORDER — ALPRAZOLAM 0.5 MG PO TABS: 0.5000 mg | ORAL_TABLET | Freq: Two times a day (BID) | ORAL | Status: DC | PRN

## 2015-01-19 NOTE — Progress Notes (Signed)
Name: Jessica [name]   MRN: [id number]    DOB: 04-16-1966   Date:01/19/2015       Progress Note  Subjective  Chief Complaint  Chief Complaint  Patient presents with  . Medication Refill    3 month F/U  . Anxiety    unchanged-family stress  . Gastrophageal Reflux    well controlled with medications  . Night Sweats    onset 1 month, unchanged    HPI  Anxiety: stable, taking Alprazolam daily and also Duloxetine.   Chronic Recurrent Major Depressive Disorder: she is feeling better, daughter is doing better, going to school and has a new job.  Taking medication. Denies crying spells, she has energy to do things she likes. Seeing a counselor once a month and is helpful.  GERD; doing well at this time, trying to wean self off.  Insomnia: doing well on Seroquel, falling asleep and staying asleep  Hot Flashes: she states over the past few months she has noticed a lot night sweats, mood changes, she would like to start medication but can't take hormones  Skin Lesions: she noticed two new lesions over the past month, one on right breast, a spot on right leg and left flank.  Not tender, but rapid growth over the past couple of months  Patient Active Problem List   Diagnosis Date Noted  . Attention and concentration deficit 12/01/2014  . B12 deficiency 12/01/2014  . H/O surgical procedure 12/01/2014  . Anxiety, generalized 12/01/2014  . Gastro-esophageal reflux disease without esophagitis 12/01/2014  . H/O elevated lipids 12/01/2014  . H/O: HTN (hypertension) 12/01/2014  . H/O: obesity 12/01/2014  . Hodgkin's disease in remission 12/01/2014  . Current tobacco use 12/01/2014  . Insomnia 03/24/2008  . Abnormal serum level of alkaline phosphatase 11/08/2006  . Chronic recurrent major depressive disorder 11/08/2006    Past Surgical History  Procedure Laterality Date  . Abdominal hysterectomy  2008  . Gastric bypass    . Tonsillectomy      Family History  Problem Relation Age  of Onset  . Cancer Mother     colon  . Epilepsy Mother   . Glaucoma Father     Social History   Social History  . Marital Status: Married    Spouse Name: N/A  . Number of Children: N/A  . Years of Education: N/A   Occupational History  . Not on file.   Social History Main Topics  . Smoking status: Current Every Day Smoker -- 1.00 packs/day for 14 years    Types: Cigarettes    Start date: 06/11/2000  . Smokeless tobacco: Never Used  . Alcohol Use: 0.0 oz/week    0 Standard drinks or equivalent per week     Comment: rarely  . Drug Use: No  . Sexual Activity:    Partners: Male   Other Topics Concern  . Not on file   Social History Narrative     Current outpatient prescriptions:  .  ALPRAZolam (XANAX) 0.5 MG tablet, Take 1 tablet (0.5 mg total) by mouth 2 (two) times daily as needed., Disp: 60 tablet, Rfl: 2 .  DULoxetine (CYMBALTA) 60 MG capsule, Take 1 capsule (60 mg total) by mouth daily., Disp: 90 capsule, Rfl: 1 .  omeprazole (PRILOSEC) 10 MG capsule, Take 1 capsule by mouth daily., Disp: , Rfl:  .  QUEtiapine (SEROQUEL) 25 MG tablet, Take 1 tablet (25 mg total) by mouth at bedtime., Disp: 90 tablet, Rfl: 1 .  cloNIDine (CATAPRES)  0.1 MG tablet, Take 1 tablet (0.1 mg total) by mouth 3 (three) times daily., Disp: 90 tablet, Rfl: 1  No Known Allergies   ROS  Constitutional: Negative for fever - but noticed temperature is higher than usual around 99, no weight change.  Respiratory: Negative for cough and shortness of breath.   Cardiovascular: Negative for chest pain or palpitations.  Gastrointestinal: Negative for abdominal pain, no bowel changes.  Musculoskeletal: Negative for gait problem or joint swelling.  Skin: Negative for rash. New skin lesions Neurological: Negative for dizziness or headache.  No other specific complaints in a complete review of systems (except as listed in HPI above).   Objective  Filed Vitals:   01/19/15 0824  BP: 128/76  Pulse:  105  Temp: 99.1 F (37.3 C)  TempSrc: Oral  Resp: 18  Height: $Remove'5\' 5"'FzBTyip$  (1.651 m)  Weight: 152 lb 11.2 oz (69.264 kg)  SpO2: 99%    Body mass index is 25.41 kg/(m^2).  Physical Exam  Constitutional: Patient appears well-developed and well-nourished.  No distress.  HEENT: head atraumatic, normocephalic, pupils equal and reactive to light,  neck supple, throat within normal limits Cardiovascular: Normal rate, regular rhythm and normal heart sounds.  No murmur heard. No BLE edema. Pulmonary/Chest: Effort normal and breath sounds normal. No respiratory distress. Abdominal: Soft.  There is no tenderness. Psychiatric: Patient has a normal mood and affect. behavior is normal. Judgment and thought content normal. Skin: SK on right buttocks 0.5 mm , raised lesion on left flank 0.6 mm and has a pigmented lesion on right breast 0.59mm  Recent Results (from the past 2160 hour(s))  Sedimentation rate     Status: None   Collection Time: 10/29/14  7:17 AM  Result Value Ref Range   Sed Rate 15 0 - 20 mm/hr  Vitamin D 1,25 dihydroxy     Status: None   Collection Time: 10/29/14  7:17 AM  Result Value Ref Range   Vitamin D 1, 25 (OH)2 Total 85 pg/mL    Comment: (NOTE) Reference Range: Adults: 21 - 65    Vitamin D3 1, 25 (OH)2 82 pg/mL    Comment: (NOTE) Performed At: [facility] [place], [place] [id number] [place] Sheral Apley MD [id number]    Vitamin D2 1, 25 (OH)2 <10 pg/mL  Ferritin     Status: None   Collection Time: 10/29/14  7:17 AM  Result Value Ref Range   Ferritin 21 11 - 307 ng/mL  Lipid panel     Status: None   Collection Time: 10/29/14  7:17 AM  Result Value Ref Range   Cholesterol 144 0 - 200 mg/dL   Triglycerides 91 <150 mg/dL   HDL 49 >40 mg/dL   Total CHOL/HDL Ratio 2.9 RATIO   VLDL 18 0 - 40 mg/dL   LDL Cholesterol 77 0 - 99 mg/dL    Comment:        Total Cholesterol/HDL:CHD Risk Coronary Heart Disease Risk Table                      Men   Women  1/2 Average Risk   3.4   3.3  Average Risk       5.0   4.4  2 X Average Risk   9.6   7.1  3 X Average Risk  23.4   11.0        Use the calculated Patient Ratio above and the CHD Risk Table to  determine the patient's CHD Risk.        ATP III CLASSIFICATION (LDL):  <100     mg/dL   Optimal  100-129  mg/dL   Near or Above                    Optimal  130-159  mg/dL   Borderline  160-189  mg/dL   High  >190     mg/dL   Very High   Comprehensive metabolic panel     Status: Abnormal   Collection Time: 10/29/14  7:17 AM  Result Value Ref Range   Sodium 139 135 - 145 mmol/L   Potassium 4.1 3.5 - 5.1 mmol/L   Chloride 105 101 - 111 mmol/L   CO2 27 22 - 32 mmol/L   Glucose, Bld 94 65 - 99 mg/dL   BUN 12 6 - 20 mg/dL   Creatinine, Ser 0.77 0.44 - 1.00 mg/dL   Calcium 8.9 8.9 - 10.3 mg/dL   Total Protein 7.3 6.5 - 8.1 g/dL   Albumin 4.0 3.5 - 5.0 g/dL   AST 22 15 - 41 U/L   ALT 18 14 - 54 U/L   Alkaline Phosphatase 168 (H) 38 - 126 U/L   Total Bilirubin 0.4 0.3 - 1.2 mg/dL   GFR calc non Af Amer >60 >60 mL/min   GFR calc Af Amer >60 >60 mL/min    Comment: (NOTE) The eGFR has been calculated using the CKD EPI equation. This calculation has not been validated in all clinical situations. eGFR's persistently <60 mL/min signify possible Chronic Kidney Disease.    Anion gap 7 5 - 15  CBC with Differential/Platelet     Status: None   Collection Time: 10/29/14  7:17 AM  Result Value Ref Range   WBC 7.7 3.6 - 11.0 K/uL   RBC 4.38 3.80 - 5.20 MIL/uL   Hemoglobin 13.7 12.0 - 16.0 g/dL   HCT 41.4 35.0 - 47.0 %   MCV 94.4 80.0 - 100.0 fL   MCH 31.2 26.0 - 34.0 pg   MCHC 33.1 32.0 - 36.0 g/dL   RDW 12.6 11.5 - 14.5 %   Platelets 254 150 - 440 K/uL   Neutrophils Relative % 52 %   Neutro Abs 4.0 1.4 - 6.5 K/uL   Lymphocytes Relative 35 %   Lymphs Abs 2.7 1.0 - 3.6 K/uL   Monocytes Relative 7 %   Monocytes Absolute 0.5 0.2 - 0.9 K/uL   Eosinophils Relative 5 %    Eosinophils Absolute 0.4 0 - 0.7 K/uL   Basophils Relative 1 %   Basophils Absolute 0.0 0 - 0.1 K/uL  Vitamin B12     Status: None   Collection Time: 10/29/14  7:17 AM  Result Value Ref Range   Vitamin B-12 195 180 - 914 pg/mL    Comment: (NOTE) This assay is not validated for testing neonatal or myeloproliferative syndrome specimens for Vitamin B12 levels. Performed at [facility]   Folate     Status: None   Collection Time: 10/29/14  7:17 AM  Result Value Ref Range   Folate 21.6 >5.9 ng/mL    PHQ2/9: Depression screen PHQ 2/9 12/01/2014  Decreased Interest 0  Down, Depressed, Hopeless 0  PHQ - 2 Score 0    Fall Risk: Fall Risk  12/01/2014  Falls in the past year? No     Assessment & Plan  1. B12 deficiency  - cyanocobalamin ((VITAMIN B-12)) injection 1,000 mcg;  Inject 1 mL (1,000 mcg total) into the muscle once.  2. Chronic recurrent major depressive disorder Continue counseling, and medication - DULoxetine (CYMBALTA) 60 MG capsule; Take 1 capsule (60 mg total) by mouth daily.  Dispense: 90 capsule; Refill: 1  3. Insomnia Doing well on medication  - QUEtiapine (SEROQUEL) 25 MG tablet; Take 1 tablet (25 mg total) by mouth at bedtime.  Dispense: 90 tablet; Refill: 1  4. Gastro-esophageal reflux disease without esophagitis Doing well, trying to wean self off  5. Anxiety, generalized stable - ALPRAZolam (XANAX) 0.5 MG tablet; Take 1 tablet (0.5 mg total) by mouth 2 (two) times daily as needed.  Dispense: 60 tablet; Refill: 2  6. Skin lesion  Consent signed: YES  Procedure: three lesion in different locations, right buttocks, left flank and right breast Equipment used: derma-blade, high temperature cautery,tweezers Anesthesia: 1% Lidocaine w Epinephrine  Cleaned and prepped: alcohol   After consent signed, are of skin prepped with betadine. Lidocaine w/o epinephrine injected into skin underneath skin mass. After properly numbed sterile equipment used to  remove lesions.  Specimen sent for pathology analysis. Instructed on proper care to allow for proper healing. F/U for nursing visit if needed.  7. Hot flashes Start clonidine - cloNIDine (CATAPRES) 0.1 MG tablet; Take 1 tablet (0.1 mg total) by mouth 3 (three) times daily.  Dispense: 90 tablet; Refill: 1

## 2015-01-19 NOTE — Addendum Note (Signed)
Addended by: Steele Sizer F on: 01/19/2015 12:41 PM   Modules accepted: Orders

## 2015-02-11 ENCOUNTER — Emergency Department: Payer: 59

## 2015-02-11 ENCOUNTER — Encounter: Payer: Self-pay | Admitting: Emergency Medicine

## 2015-02-11 ENCOUNTER — Ambulatory Visit: Payer: 59

## 2015-02-11 ENCOUNTER — Emergency Department
Admission: EM | Admit: 2015-02-11 | Discharge: 2015-02-12 | Disposition: A | Discharge status: Home / Self Care | Payer: 59 | Attending: Emergency Medicine | Admitting: Emergency Medicine

## 2015-02-11 DIAGNOSIS — I10 Essential (primary) hypertension: Secondary | ICD-10-CM | POA: Insufficient documentation

## 2015-02-11 DIAGNOSIS — R2241 Localized swelling, mass and lump, right lower limb: Secondary | ICD-10-CM | POA: Insufficient documentation

## 2015-02-11 DIAGNOSIS — Z79899 Other long term (current) drug therapy: Secondary | ICD-10-CM | POA: Diagnosis not present

## 2015-02-11 DIAGNOSIS — M5441 Lumbago with sciatica, right side: Secondary | ICD-10-CM | POA: Insufficient documentation

## 2015-02-11 DIAGNOSIS — R609 Edema, unspecified: Secondary | ICD-10-CM

## 2015-02-11 DIAGNOSIS — M545 Low back pain: Secondary | ICD-10-CM | POA: Diagnosis present

## 2015-02-11 DIAGNOSIS — Z720 Tobacco use: Secondary | ICD-10-CM | POA: Insufficient documentation

## 2015-02-11 LAB — URINALYSIS COMPLETE WITH MICROSCOPIC (ARMC ONLY)
Bilirubin Urine: NEGATIVE
Glucose, UA: NEGATIVE mg/dL
Hgb urine dipstick: NEGATIVE
Ketones, ur: NEGATIVE mg/dL
Leukocytes, UA: NEGATIVE
Nitrite: NEGATIVE
PH: 6 (ref 5.0–8.0)
PROTEIN: NEGATIVE mg/dL
Specific Gravity, Urine: 1.002 — ABNORMAL LOW (ref 1.005–1.030)

## 2015-02-11 LAB — CBC
HCT: 39.1 % (ref 35.0–47.0)
Hemoglobin: 13.3 g/dL (ref 12.0–16.0)
MCH: 31.8 pg (ref 26.0–34.0)
MCHC: 34.1 g/dL (ref 32.0–36.0)
MCV: 93.3 fL (ref 80.0–100.0)
Platelets: 245 10*3/uL (ref 150–440)
RBC: 4.19 MIL/uL (ref 3.80–5.20)
RDW: 13 % (ref 11.5–14.5)
WBC: 11.4 10*3/uL — ABNORMAL HIGH (ref 3.6–11.0)

## 2015-02-11 MED ORDER — CYCLOBENZAPRINE HCL 10 MG PO TABS
5.0000 mg | ORAL_TABLET | Freq: Once | ORAL | Status: AC
  Administered 2015-02-11: 5 mg via ORAL
  Filled 2015-02-11: qty 1

## 2015-02-11 MED ORDER — KETOROLAC TROMETHAMINE 30 MG/ML IJ SOLN
30.0000 mg | Freq: Once | INTRAMUSCULAR | Status: AC
  Administered 2015-02-11: 30 mg via INTRAVENOUS
  Filled 2015-02-11: qty 1

## 2015-02-11 NOTE — ED Notes (Addendum)
Pt presents to ED with right sided lower back pain/spasms. Pain is reproducible with palpation. No known injury. Pt alert and very anxious. States she is going to the beach in the morning and wants to make sure everything is ok. Pt states she is also "holding fluid" and has noticed swelling in her hands and right leg the past couple of day. Denies sob.

## 2015-02-11 NOTE — ED Notes (Signed)
Pt reports right lower back pain x 1 day.  Pt reports swelling in right leg.  Pt reports taking diuretics with limited relief. Pt denies any injury.  Pt anxious at this time.

## 2015-02-12 LAB — COMPREHENSIVE METABOLIC PANEL
ALT: 16 U/L (ref 14–54)
AST: 20 U/L (ref 15–41)
Albumin: 3.9 g/dL (ref 3.5–5.0)
Alkaline Phosphatase: 150 U/L — ABNORMAL HIGH (ref 38–126)
Anion gap: 5 (ref 5–15)
BUN: 13 mg/dL (ref 6–20)
CHLORIDE: 107 mmol/L (ref 101–111)
CO2: 28 mmol/L (ref 22–32)
Calcium: 9.1 mg/dL (ref 8.9–10.3)
Creatinine, Ser: 0.69 mg/dL (ref 0.44–1.00)
GFR calc Af Amer: >60 mL/min (ref >60)
GFR calc non Af Amer: >60 mL/min (ref >60)
Glucose, Bld: 90 mg/dL (ref 65–99)
POTASSIUM: 4.1 mmol/L (ref 3.5–5.1)
Sodium: 140 mmol/L (ref 135–145)
Total Bilirubin: 0.5 mg/dL (ref 0.3–1.2)
Total Protein: 7 g/dL (ref 6.5–8.1)

## 2015-02-12 MED ORDER — CYCLOBENZAPRINE HCL 10 MG PO TABS: 10.0000 mg | ORAL_TABLET | Freq: Three times a day (TID) | ORAL | Status: DC | PRN

## 2015-02-12 MED ORDER — FUROSEMIDE 20 MG PO TABS: 20.0000 mg | ORAL_TABLET | Freq: Every day | ORAL | Status: DC

## 2015-02-12 NOTE — ED Provider Notes (Signed)
[facility] Emergency Department Provider Note  ____________________________________________  Time seen: Approximately 2310 AM  I have reviewed the triage vital signs and the nursing notes.   HISTORY  Chief Complaint Back Pain    HPI KYMIAH [name] is a 49 y.o. female who comes in with back pain. The patient reports that she's been having swelling of her right leg since yesterday and today started having some pain in her right flank. The patient denies having kidney stones in the past and denies any bending stretching or lifting heavy objects. She reports though that she did lift a few cases of water but she was having back pain prior to lifting the water. The patient reports that she has been retaining fluid and she is unsure why. The patient reports that she was seen in December with similar back pain. The patient denies any urinary symptoms.She reports that her right leg seems more swollen than the left. The patient has recently started clonidine for night sweats. The patient reports that she hasn't eaten much today she's been very busy. She reports her pain as a 9 out of 10 in intensity.   Past Medical History  Diagnosis Date  . Hypertension     Patient Active Problem List   Diagnosis Date Noted  . Attention and concentration deficit 12/01/2014  . B12 deficiency 12/01/2014  . H/O surgical procedure 12/01/2014  . Anxiety, generalized 12/01/2014  . Gastro-esophageal reflux disease without esophagitis 12/01/2014  . H/O elevated lipids 12/01/2014  . H/O: HTN (hypertension) 12/01/2014  . H/O: obesity 12/01/2014  . Hodgkin's disease in remission 12/01/2014  . Current tobacco use 12/01/2014  . Insomnia 03/24/2008  . Abnormal serum level of alkaline phosphatase 11/08/2006  . Chronic recurrent major depressive disorder 11/08/2006    Past Surgical History  Procedure Laterality Date  . Abdominal hysterectomy  2008  . Gastric bypass    . Tonsillectomy       Current Outpatient Rx  Name  Route  Sig  Dispense  Refill  . ALPRAZolam (XANAX) 0.5 MG tablet   Oral   Take 1 tablet (0.5 mg total) by mouth 2 (two) times daily as needed.   60 tablet   2   . cloNIDine (CATAPRES) 0.1 MG tablet   Oral   Take 1 tablet (0.1 mg total) by mouth 3 (three) times daily. Patient taking differently: Take 0.1 mg by mouth at bedtime.    90 tablet   1   . DULoxetine (CYMBALTA) 60 MG capsule   Oral   Take 1 capsule (60 mg total) by mouth daily.   90 capsule   1   . omeprazole (PRILOSEC) 10 MG capsule   Oral   Take 1 capsule by mouth daily.         . QUEtiapine (SEROQUEL) 25 MG tablet   Oral   Take 1 tablet (25 mg total) by mouth at bedtime.   90 tablet   1   . cyclobenzaprine (FLEXERIL) 10 MG tablet   Oral   Take 1 tablet (10 mg total) by mouth every 8 (eight) hours as needed for muscle spasms.   15 tablet   0   . furosemide (LASIX) 20 MG tablet   Oral   Take 1 tablet (20 mg total) by mouth daily.   7 tablet   0     Allergies Review of patient's allergies indicates no known allergies.  Family History  Problem Relation Age of Onset  . Cancer Mother  colon  . Epilepsy Mother   . Glaucoma Father     Social History Social History  Substance Use Topics  . Smoking status: Current Every Day Smoker -- 1.00 packs/day for 14 years    Types: Cigarettes    Start date: 06/11/2000  . Smokeless tobacco: Never Used  . Alcohol Use: 0.0 oz/week    0 Standard drinks or equivalent per week     Comment: rarely    Review of Systems Constitutional: No fever/chills Eyes: No visual changes. ENT: No sore throat. Cardiovascular: Denies chest pain. Respiratory: Denies shortness of breath. Gastrointestinal: No abdominal pain.  No nausea, no vomiting.  No diarrhea.  No constipation. Genitourinary: Negative for dysuria. Musculoskeletal:  back pain, right leg swelling Skin: Negative for rash. Neurological: Negative for headaches, focal  weakness or numbness.  10-point ROS otherwise negative.  ____________________________________________   PHYSICAL EXAM:  VITAL SIGNS: ED Triage Vitals  Enc Vitals Group     BP 02/11/15 2154 146/97 mmHg     Pulse Rate 02/11/15 2154 87     Resp 02/11/15 2154 22     Temp 02/11/15 2154 97.9 F (36.6 C)     Temp Source 02/11/15 2154 Oral     SpO2 02/11/15 2154 100 %     Weight 02/11/15 2154 150 lb (68.04 kg)     Height 02/11/15 2154 5\' 5"  (1.651 m)     Head Cir --      Peak Flow --      Pain Score 02/11/15 2155 9     Pain Loc --      Pain Edu? --      Excl. in [place]? --     Constitutional: Alert and oriented. Well appearing and in moderate distress. Eyes: Conjunctivae are normal. PERRL. EOMI. Head: Atraumatic. Nose: No congestion/rhinnorhea. Mouth/Throat: Mucous membranes are moist.  Oropharynx non-erythematous. Cardiovascular: Normal rate, regular rhythm. Grossly normal heart sounds.  Good peripheral circulation. Respiratory: Normal respiratory effort.  No retractions. Lungs CTAB. Gastrointestinal: Soft and nontender. No distention.  Musculoskeletal: Right back pain tenderness to palpation extending over her SI joint with positive straight leg raise on the right. Bilateral lower extremity swelling with swelling greater on right versus left.  Neurologic:  Normal speech and language.  Skin:  Skin is warm, dry and intact. Marland Kitchen Psychiatric: Mood and affect are normal.   ____________________________________________   LABS (all labs ordered are listed, but only abnormal results are displayed)  Labs Reviewed  URINALYSIS COMPLETEWITH MICROSCOPIC ([place]) - Abnormal; Notable for the following:    Color, Urine STRAW (*)    APPearance CLEAR (*)    Specific Gravity, Urine 1.002 (*)    Bacteria, UA RARE (*)    Squamous Epithelial / LPF 0-5 (*)    All other components within normal limits  COMPREHENSIVE METABOLIC PANEL - Abnormal; Notable for the following:    Alkaline Phosphatase  150 (*)    All other components within normal limits  CBC - Abnormal; Notable for the following:    WBC 11.4 (*)    All other components within normal limits   ____________________________________________  EKG  None ____________________________________________  RADIOLOGY  Ultrasound Doppler: No evidence of deep venous thrombosis Renal ultrasound: Normal kidneys, negative for hydronephrosis, benign fat-containing left adrenal mass again noted. ____________________________________________   PROCEDURES  Procedure(s) performed: None  Critical Care performed: No  ____________________________________________   INITIAL IMPRESSION / ASSESSMENT AND PLAN / ED COURSE  Pertinent labs & imaging results that were available during  my care of the patient were reviewed by me and considered in my medical decision making (see chart for details).  This is a 49 year old female who comes in today with some right flank pain as well as some right leg swelling. I will give the patient a dose of Toradol as well as Flexeril and do an ultrasound looking for DVT in her leg as well as looking for a stone in her kidney. The patient be reassessed when she receives her ultrasound.  After the Toradol and Flexeril the patient's pain is down to a 3 out of 10. I informed the patient that the ultrasounds are negative. I will discharge the patient home with some Lasix and Flexeril and have her follow back up with her primary care physician. Otherwise patient has no further complaints or concerns will be discharged home. ____________________________________________   FINAL CLINICAL IMPRESSION(S) / ED DIAGNOSES  Final diagnoses:  Right-sided low back pain with right-sided sciatica  Peripheral edema      Loney Hering, MD 02/12/15 [phone]

## 2015-02-12 NOTE — Discharge Instructions (Signed)
Back Pain, Adult Back pain is very common. The pain often gets better over time. The cause of back pain is usually not dangerous. Most people can learn to manage their back pain on their own.  HOME CARE   Stay active. Start with short walks on flat ground if you can. Try to walk farther each day.  Do not sit, drive, or stand in one place for more than 30 minutes. Do not stay in bed.  Do not avoid exercise or work. Activity can help your back heal faster.  Be careful when you bend or lift an object. Bend at your knees, keep the object close to you, and do not twist.  Sleep on a firm mattress. Lie on your side, and bend your knees. If you lie on your back, put a pillow under your knees.  Only take medicines as told by your doctor.  Put ice on the injured area.  Put ice in a plastic bag.  Place a towel between your skin and the bag.  Leave the ice on for 15-20 minutes, 03-04 times a day for the first 2 to 3 days. After that, you can switch between ice and heat packs.  Ask your doctor about back exercises or massage.  Avoid feeling anxious or stressed. Find good ways to deal with stress, such as exercise. GET HELP RIGHT AWAY IF:   Your pain does not go away with rest or medicine.  Your pain does not go away in 1 week.  You have new problems.  You do not feel well.  The pain spreads into your legs.  You cannot control when you poop (bowel movement) or pee (urinate).  Your arms or legs feel weak or lose feeling (numbness).  You feel sick to your stomach (nauseous) or throw up (vomit).  You have belly (abdominal) pain.  You feel like you may pass out (faint). MAKE SURE YOU:   Understand these instructions.  Will watch your condition.  Will get help right away if you are not doing well or get worse. Document Released: 11/14/2007 Document Revised: 08/20/2011 Document Reviewed: 09/29/2013 [facility] Patient Information 2015 [place], [place]. This information is not intended  to replace advice given to you by your health care provider. Make sure you discuss any questions you have with your health care provider.  Edema Edema is an abnormal buildup of fluids in your bodytissues. Edema is somewhatdependent on gravity to pull the fluid to the lowest place in your body. That makes the condition more common in the legs and thighs (lower extremities). Painless swelling of the feet and ankles is common and becomes more likely as you get older. It is also common in looser tissues, like around your eyes.  When the affected area is squeezed, the fluid may move out of that spot and leave a dent for a few moments. This dent is called pitting.  CAUSES  There are many possible causes of edema. Eating too much salt and being on your feet or sitting for a long time can cause edema in your legs and ankles. Hot weather may make edema worse. Common medical causes of edema include:  Heart failure.  Liver disease.  Kidney disease.  Weak blood vessels in your legs.  Cancer.  An injury.  Pregnancy.  Some medications.  Obesity. SYMPTOMS  Edema is usually painless.Your skin may look swollen or shiny.  DIAGNOSIS  Your health care provider may be able to diagnose edema by asking about your medical history and  doing a physical exam. You may need to have tests such as X-rays, an electrocardiogram, or blood tests to check for medical conditions that may cause edema.  TREATMENT  Edema treatment depends on the cause. If you have heart, liver, or kidney disease, you need the treatment appropriate for these conditions. General treatment may include:  Elevation of the affected body part above the level of your heart.  Compression of the affected body part. Pressure from elastic bandages or support stockings squeezes the tissues and forces fluid back into the blood vessels. This keeps fluid from entering the tissues.  Restriction of fluid and salt intake.  Use of a water pill  (diuretic). These medications are appropriate only for some types of edema. They pull fluid out of your body and make you urinate more often. This gets rid of fluid and reduces swelling, but diuretics can have side effects. Only use diuretics as directed by your health care provider. HOME CARE INSTRUCTIONS   Keep the affected body part above the level of your heart when you are lying down.   Do not sit still or stand for prolonged periods.   Do not put anything directly under your knees when lying down.  Do not wear constricting clothing or garters on your upper legs.   Exercise your legs to work the fluid back into your blood vessels. This may help the swelling go down.   Wear elastic bandages or support stockings to reduce ankle swelling as directed by your health care provider.   Eat a low-salt diet to reduce fluid if your health care provider recommends it.   Only take medicines as directed by your health care provider. SEEK MEDICAL CARE IF:   Your edema is not responding to treatment.  You have heart, liver, or kidney disease and notice symptoms of edema.  You have edema in your legs that does not improve after elevating them.   You have sudden and unexplained weight gain. SEEK IMMEDIATE MEDICAL CARE IF:   You develop shortness of breath or chest pain.   You cannot breathe when you lie down.  You develop pain, redness, or warmth in the swollen areas.   You have heart, liver, or kidney disease and suddenly get edema.  You have a fever and your symptoms suddenly get worse. MAKE SURE YOU:   Understand these instructions.  Will watch your condition.  Will get help right away if you are not doing well or get worse. Document Released: 05/28/2005 Document Revised: 10/12/2013 Document Reviewed: 03/20/2013 [facility] Patient Information 2015 [place], [place]. This information is not intended to replace advice given to you by your health care provider. Make sure you  discuss any questions you have with your health care provider.  Sciatica Sciatica is pain, weakness, numbness, or tingling along the path of the sciatic nerve. The nerve starts in the lower back and runs down the back of each leg. The nerve controls the muscles in the lower leg and in the back of the knee, while also providing sensation to the back of the thigh, lower leg, and the sole of your foot. Sciatica is a symptom of another medical condition. For instance, nerve damage or certain conditions, such as a herniated disk or bone spur on the spine, pinch or put pressure on the sciatic nerve. This causes the pain, weakness, or other sensations normally associated with sciatica. Generally, sciatica only affects one side of the body. CAUSES   Herniated or slipped disc.  Degenerative disk disease.  A pain disorder involving the narrow muscle in the buttocks (piriformis syndrome).  Pelvic injury or fracture.  Pregnancy.  Tumor (rare). SYMPTOMS  Symptoms can vary from mild to very severe. The symptoms usually travel from the low back to the buttocks and down the back of the leg. Symptoms can include:  Mild tingling or dull aches in the lower back, leg, or hip.  Numbness in the back of the calf or sole of the foot.  Burning sensations in the lower back, leg, or hip.  Sharp pains in the lower back, leg, or hip.  Leg weakness.  Severe back pain inhibiting movement. These symptoms may get worse with coughing, sneezing, laughing, or prolonged sitting or standing. Also, being overweight may worsen symptoms. DIAGNOSIS  Your caregiver will perform a physical exam to look for common symptoms of sciatica. He or she may ask you to do certain movements or activities that would trigger sciatic nerve pain. Other tests may be performed to find the cause of the sciatica. These may include:  Blood tests.  X-rays.  Imaging tests, such as an MRI or CT scan. TREATMENT  Treatment is directed at the  cause of the sciatic pain. Sometimes, treatment is not necessary and the pain and discomfort goes away on its own. If treatment is needed, your caregiver may suggest:  Over-the-counter medicines to relieve pain.  Prescription medicines, such as anti-inflammatory medicine, muscle relaxants, or narcotics.  Applying heat or ice to the painful area.  Steroid injections to lessen pain, irritation, and inflammation around the nerve.  Reducing activity during periods of pain.  Exercising and stretching to strengthen your abdomen and improve flexibility of your spine. Your caregiver may suggest losing weight if the extra weight makes the back pain worse.  Physical therapy.  Surgery to eliminate what is pressing or pinching the nerve, such as a bone spur or part of a herniated disk. HOME CARE INSTRUCTIONS   Only take over-the-counter or prescription medicines for pain or discomfort as directed by your caregiver.  Apply ice to the affected area for 20 minutes, 3-4 times a day for the first 48-72 hours. Then try heat in the same way.  Exercise, stretch, or perform your usual activities if these do not aggravate your pain.  Attend physical therapy sessions as directed by your caregiver.  Keep all follow-up appointments as directed by your caregiver.  Do not wear high heels or shoes that do not provide proper support.  Check your mattress to see if it is too soft. A firm mattress may lessen your pain and discomfort. SEEK IMMEDIATE MEDICAL CARE IF:   You lose control of your bowel or bladder (incontinence).  You have increasing weakness in the lower back, pelvis, buttocks, or legs.  You have redness or swelling of your back.  You have a burning sensation when you urinate.  You have pain that gets worse when you lie down or awakens you at night.  Your pain is worse than you have experienced in the past.  Your pain is lasting longer than 4 weeks.  You are suddenly losing weight  without reason. MAKE SURE YOU:  Understand these instructions.  Will watch your condition.  Will get help right away if you are not doing well or get worse. Document Released: 05/22/2001 Document Revised: 11/27/2011 Document Reviewed: 10/07/2011 [facility] Patient Information 2015 [place], [place]. This information is not intended to replace advice given to you by your health care provider. Make sure you discuss any questions you  have with your health care provider. ° °

## 2015-04-05 ENCOUNTER — Other Ambulatory Visit: Payer: Self-pay | Admitting: Family Medicine

## 2015-04-05 NOTE — Telephone Encounter (Signed)
Patient requesting refill. 

## 2015-04-22 ENCOUNTER — Ambulatory Visit (INDEPENDENT_AMBULATORY_CARE_PROVIDER_SITE_OTHER): Payer: 59 | Admitting: Family Medicine

## 2015-04-22 ENCOUNTER — Encounter: Payer: Self-pay | Admitting: Family Medicine

## 2015-04-22 VITALS — BP 130/80 | HR 96 | Temp 99.2°F | Resp 14 | Ht 65.0 in | Wt 161.2 lb

## 2015-04-22 DIAGNOSIS — Z8571 Personal history of Hodgkin lymphoma: Secondary | ICD-10-CM | POA: Diagnosis not present

## 2015-04-22 DIAGNOSIS — M549 Dorsalgia, unspecified: Secondary | ICD-10-CM

## 2015-04-22 DIAGNOSIS — E538 Deficiency of other specified B group vitamins: Secondary | ICD-10-CM

## 2015-04-22 DIAGNOSIS — G8929 Other chronic pain: Secondary | ICD-10-CM | POA: Diagnosis not present

## 2015-04-22 DIAGNOSIS — F339 Major depressive disorder, recurrent, unspecified: Secondary | ICD-10-CM | POA: Diagnosis not present

## 2015-04-22 DIAGNOSIS — G4700 Insomnia, unspecified: Secondary | ICD-10-CM

## 2015-04-22 DIAGNOSIS — F411 Generalized anxiety disorder: Secondary | ICD-10-CM | POA: Diagnosis not present

## 2015-04-22 DIAGNOSIS — K219 Gastro-esophageal reflux disease without esophagitis: Secondary | ICD-10-CM | POA: Diagnosis not present

## 2015-04-22 DIAGNOSIS — Z9884 Bariatric surgery status: Secondary | ICD-10-CM | POA: Diagnosis not present

## 2015-04-22 DIAGNOSIS — K130 Diseases of lips: Secondary | ICD-10-CM | POA: Diagnosis not present

## 2015-04-22 DIAGNOSIS — M5430 Sciatica, unspecified side: Secondary | ICD-10-CM | POA: Insufficient documentation

## 2015-04-22 DIAGNOSIS — C8190 Hodgkin lymphoma, unspecified, unspecified site: Secondary | ICD-10-CM

## 2015-04-22 MED ORDER — CYANOCOBALAMIN 1000 MCG/ML IJ SOLN
1000.0000 ug | Freq: Once | INTRAMUSCULAR | Status: AC
  Administered 2015-04-22: 1000 ug via INTRAMUSCULAR

## 2015-04-22 MED ORDER — ALPRAZOLAM 0.5 MG PO TABS: 0.5000 mg | ORAL_TABLET | Freq: Two times a day (BID) | ORAL | Status: DC | PRN

## 2015-04-22 MED ORDER — TRAMADOL HCL 50 MG PO TABS
50.0000 mg | ORAL_TABLET | Freq: Three times a day (TID) | ORAL | Status: DC | PRN
Start: 2015-04-22 — End: 2015-05-23

## 2015-04-22 MED ORDER — CYCLOBENZAPRINE HCL 10 MG PO TABS: 10.0000 mg | ORAL_TABLET | Freq: Three times a day (TID) | ORAL | Status: DC | PRN

## 2015-04-22 NOTE — Addendum Note (Signed)
Addended by: Inda Coke on: 04/22/2015 08:47 AM   Modules accepted: Orders

## 2015-04-22 NOTE — Progress Notes (Signed)
Name: Jessica [name]   MRN: [id number]    DOB: 12/18/1965   Date:04/22/2015       Progress Note  Subjective  Chief Complaint  Chief Complaint  Patient presents with  . Medication Refill    follow-up  . Depression  . Insomnia  . Gastroesophageal Reflux  . Sciatica    left side    HPI   Insomnia: patient states she is doing well on Seroquel, able to fall and stay asleep. Getting at least 7 hours per night.   Chronic low back pain with recurrent sciatica: she went to [facility] in 02/2015, CBC was high, doppler US negative - she has leg edema, also negative renal US. She states low back pain is deep aching pain, when she has shooting pain down left buttock . Sometimes severe spasms. She is taking Advil, but had bariatric surgery and explained the importance of stop taking it. Only Tylenol .  Major Depression: taking Cymbata, and states she still feels tired, has mood swings, no crying spells or suicidal thoughts. She states her mood swings are hormonal ( menopausal ) explained likely from Depression, but she does not want to adjust medication  GERD: taking Omeprazole daily and symptoms are under control  B12 deficiency: feeling tired, we will give her a shot today  Cheilitis: she states one and off past couple of months, getting gradually better, taking MVI daily.   Night Sweats: taking Clonidine qhs but still wakes up all sweaty, but she states if she sleeps in the guest room she sleeps better , and does not get up feeling hot  Patient Active Problem List   Diagnosis Date Noted  . Sciatica 04/22/2015  . Attention and concentration deficit 12/01/2014  . B12 deficiency 12/01/2014  . History of bariatric surgery 12/01/2014  . Anxiety, generalized 12/01/2014  . Gastro-esophageal reflux disease without esophagitis 12/01/2014  . H/O elevated lipids 12/01/2014  . H/O: HTN (hypertension) 12/01/2014  . H/O: obesity 12/01/2014  . Hodgkin's disease in remission 12/01/2014  . Current tobacco  use 12/01/2014  . Insomnia 03/24/2008  . Abnormal serum level of alkaline phosphatase 11/08/2006  . Chronic recurrent major depressive disorder ([place]) 11/08/2006    Past Surgical History  Procedure Laterality Date  . Abdominal hysterectomy  2008  . Gastric bypass    . Tonsillectomy      Family History  Problem Relation Age of Onset  . Cancer Mother     colon  . Epilepsy Mother   . Glaucoma Father     Social History   Social History  . Marital Status: Married    Spouse Name: N/A  . Number of Children: N/A  . Years of Education: N/A   Occupational History  . Not on file.   Social History Main Topics  . Smoking status: Current Every Day Smoker -- 1.00 packs/day for 14 years    Types: Cigarettes    Start date: 06/11/2000  . Smokeless tobacco: Never Used  . Alcohol Use: 0.0 oz/week    0 Standard drinks or equivalent per week     Comment: rarely  . Drug Use: No  . Sexual Activity:    Partners: Male   Other Topics Concern  . Not on file   Social History Narrative     Current outpatient prescriptions:  .  ALPRAZolam (XANAX) 0.5 MG tablet, Take 1 tablet (0.5 mg total) by mouth 2 (two) times daily as needed., Disp: 60 tablet, Rfl: 2 .  cloNIDine (CATAPRES) 0.1 MG  tablet, TAKE 1 TABLET BY MOUTH 3 TIMES DAILY, Disp: 90 tablet, Rfl: 1 .  cyclobenzaprine (FLEXERIL) 10 MG tablet, Take 1 tablet (10 mg total) by mouth every 8 (eight) hours as needed for muscle spasms., Disp: 90 tablet, Rfl: 1 .  DULoxetine (CYMBALTA) 60 MG capsule, Take 1 capsule (60 mg total) by mouth daily., Disp: 90 capsule, Rfl: 1 .  omeprazole (PRILOSEC) 10 MG capsule, Take 1 capsule by mouth daily., Disp: , Rfl:  .  QUEtiapine (SEROQUEL) 25 MG tablet, Take 1 tablet (25 mg total) by mouth at bedtime., Disp: 90 tablet, Rfl: 1  Current facility-administered medications:  .  lidocaine (PF) (XYLOCAINE) 1 % injection 4 mL, 4 mL, Intradermal, Once, Steele Sizer, MD  No Known  Allergies   ROS   Constitutional: Negative for fever or weight change.  Respiratory: Negative for cough and shortness of breath.   Cardiovascular: Negative for chest pain or palpitations.  Gastrointestinal: Negative for abdominal pain, no bowel changes.  Musculoskeletal: Negative for gait problem or joint swelling.  Skin: Negative for rash.  Neurological: Negative for dizziness or headache.  No other specific complaints in a complete review of systems (except as listed in HPI above).  Objective  Filed Vitals:   04/22/15 0810  BP: 130/80  Pulse: 96  Temp: 99.2 F (37.3 C)  TempSrc: Oral  Resp: 14  Height: $Remove'5\' 5"'ICDzwym$  (1.651 m)  Weight: 161 lb 3.2 oz (73.12 kg)  SpO2: 98%    Body mass index is 26.83 kg/(m^2).  Physical Exam  Constitutional: Patient appears well-developed and well-nourished. No distress.  HEENT: head atraumatic, normocephalic, pupils equal and reactive to light, neck supple, throat within normal limits Cardiovascular: Normal rate, regular rhythm and normal heart sounds.  No murmur heard. No BLE edema. Pulmonary/Chest: Effort normal and breath sounds normal. No respiratory distress. Abdominal: Soft.  There is no tenderness. Psychiatric: Patient has a normal mood and affect. behavior is normal. Judgment and thought content normal. Muscular Skeletal: pain during palpation of left lower back, negative straight leg raise, normal ROM lumbar spine  Recent Results (from the past 2160 hour(s))  Urinalysis complete, with microscopic ([facility] only)     Status: Abnormal   Collection Time: 02/11/15 10:25 PM  Result Value Ref Range   Color, Urine STRAW (A) YELLOW   APPearance CLEAR (A) CLEAR   Glucose, UA NEGATIVE NEGATIVE mg/dL   Bilirubin Urine NEGATIVE NEGATIVE   Ketones, ur NEGATIVE NEGATIVE mg/dL   Specific Gravity, Urine 1.002 (L) 1.005 - 1.030   Hgb urine dipstick NEGATIVE NEGATIVE   pH 6.0 5.0 - 8.0   Protein, ur NEGATIVE NEGATIVE mg/dL   Nitrite NEGATIVE  NEGATIVE   Leukocytes, UA NEGATIVE NEGATIVE   RBC / HPF 0-5 0 - 5 RBC/hpf   WBC, UA 0-5 0 - 5 WBC/hpf   Bacteria, UA RARE (A) NONE SEEN   Squamous Epithelial / LPF 0-5 (A) NONE SEEN   Mucous PRESENT   Comprehensive metabolic panel     Status: Abnormal   Collection Time: 02/11/15 11:33 PM  Result Value Ref Range   Sodium 140 135 - 145 mmol/L   Potassium 4.1 3.5 - 5.1 mmol/L   Chloride 107 101 - 111 mmol/L   CO2 28 22 - 32 mmol/L   Glucose, Bld 90 65 - 99 mg/dL   BUN 13 6 - 20 mg/dL   Creatinine, Ser 0.69 0.44 - 1.00 mg/dL   Calcium 9.1 8.9 - 10.3 mg/dL   Total Protein 7.0 6.5 -  8.1 g/dL   Albumin 3.9 3.5 - 5.0 g/dL   AST 20 15 - 41 U/L   ALT 16 14 - 54 U/L   Alkaline Phosphatase 150 (H) 38 - 126 U/L   Total Bilirubin 0.5 0.3 - 1.2 mg/dL   GFR calc non Af Amer >60 >60 mL/min   GFR calc Af Amer >60 >60 mL/min    Comment: (NOTE) The eGFR has been calculated using the CKD EPI equation. This calculation has not been validated in all clinical situations. eGFR's persistently <60 mL/min signify possible Chronic Kidney Disease.    Anion gap 5 5 - 15  CBC     Status: Abnormal   Collection Time: 02/11/15 11:33 PM  Result Value Ref Range   WBC 11.4 (H) 3.6 - 11.0 K/uL   RBC 4.19 3.80 - 5.20 MIL/uL   Hemoglobin 13.3 12.0 - 16.0 g/dL   HCT 39.1 35.0 - 47.0 %   MCV 93.3 80.0 - 100.0 fL   MCH 31.8 26.0 - 34.0 pg   MCHC 34.1 32.0 - 36.0 g/dL   RDW 13.0 11.5 - 14.5 %   Platelets 245 150 - 440 K/uL     PHQ2/9: Depression screen [facility] 2/9 04/22/2015 12/01/2014  Decreased Interest 0 0  Down, Depressed, Hopeless 0 0  PHQ - 2 Score 0 0    Fall Risk: Fall Risk  04/22/2015 12/01/2014  Falls in the past year? No No    Functional Status Survey: Is the patient deaf or have difficulty hearing?: No Does the patient have difficulty seeing, even when wearing glasses/contacts?: Yes (glasses) Does the patient have difficulty concentrating, remembering, or making decisions?: No Does the  patient have difficulty walking or climbing stairs?: No Does the patient have difficulty dressing or bathing?: No Does the patient have difficulty doing errands alone such as visiting a doctor's office or shopping?: No    Assessment & Plan  1. Gastro-esophageal reflux disease without esophagitis  Continue medication, needs to stop NSAID's  2. History of bariatric surgery  - Vit D  25 hydroxy (rtn osteoporosis monitoring) -Thiamine   3. Anxiety, generalized  - ALPRAZolam (XANAX) 0.5 MG tablet; Take 1 tablet (0.5 mg total) by mouth 2 (two) times daily as needed.  Dispense: 60 tablet; Refill: 2  4. B12 deficiency  - Vitamin B12  5. Insomnia  Doing well on Seroquel   6. Hodgkin's disease in remission  - CBC with Differential/Platelet  7. Angular cheilitis  Avoid licking corner of mouth, use chapstick continue MVI  8. Sciatica, unspecified laterality  - cyclobenzaprine (FLEXERIL) 10 MG tablet; Take 1 tablet (10 mg total) by mouth every 8 (eight) hours as needed for muscle spasms.  Dispense: 90 tablet; Refill: 1  9. Chronic recurrent major depressive disorder ([place])  Continue Cymbalta, she does not want to see a Psychiatrist, but seen by a therapist in the past  10. Chronic back pain   - traMADol (ULTRAM) 50 MG tablet; Take 1 tablet (50 mg total) by mouth every 8 (eight) hours as needed for moderate pain.  Dispense: 90 tablet; Refill: 0

## 2015-05-10 ENCOUNTER — Ambulatory Visit: Payer: 59 | Admitting: Physical Therapy

## 2015-05-23 ENCOUNTER — Other Ambulatory Visit: Payer: Self-pay | Admitting: Family Medicine

## 2015-05-23 NOTE — Telephone Encounter (Signed)
Patient requesting refill. 

## 2015-06-02 ENCOUNTER — Ambulatory Visit: Payer: 59 | Admitting: Family Medicine

## 2015-06-08 ENCOUNTER — Ambulatory Visit: Payer: 59 | Admitting: Family Medicine

## 2015-06-17 ENCOUNTER — Encounter: Payer: Self-pay | Admitting: Family Medicine

## 2015-06-17 ENCOUNTER — Ambulatory Visit (INDEPENDENT_AMBULATORY_CARE_PROVIDER_SITE_OTHER): Payer: 59 | Admitting: Family Medicine

## 2015-06-17 VITALS — BP 118/78 | HR 122 | Temp 98.7°F | Resp 18 | Wt 163.5 lb

## 2015-06-17 DIAGNOSIS — K219 Gastro-esophageal reflux disease without esophagitis: Secondary | ICD-10-CM

## 2015-06-17 DIAGNOSIS — K130 Diseases of lips: Secondary | ICD-10-CM

## 2015-06-17 DIAGNOSIS — F339 Major depressive disorder, recurrent, unspecified: Secondary | ICD-10-CM

## 2015-06-17 DIAGNOSIS — G4700 Insomnia, unspecified: Secondary | ICD-10-CM | POA: Diagnosis not present

## 2015-06-17 MED ORDER — DULOXETINE HCL 60 MG PO CPEP
60.0000 mg | ORAL_CAPSULE | Freq: Every day | ORAL | Status: DC
Start: 2015-06-17 — End: 2015-10-28

## 2015-06-17 MED ORDER — QUETIAPINE FUMARATE 25 MG PO TABS: 25.0000 mg | ORAL_TABLET | Freq: Every day | ORAL | Status: DC

## 2015-06-17 NOTE — Progress Notes (Signed)
Name: Jessica [name]   MRN: [id number]    DOB: 07-23-1965   Date:06/17/2015       Progress Note  Subjective  Chief Complaint  Chief Complaint  Patient presents with  . Gastroesophageal Reflux    patient stated that she is having a hard time with digesting meat    HPI   GAD: stable, taking Alprazolam daily and also Duloxetine. She takes half dose at work sometimes when too nervous and edgy  Chronic Recurrent Major Depressive Disorder:  Taking medication. Denies crying spells, she has energy to do things she likes. Seeing a counselor on a prn basis now. Her children are doing well. She had a great Christmas. She denies suicidal thoughts or ideation. She has started adult coloring books  GERD: she is s/p bariatric surgery, can only eat a very small amount of food, she denies burning or regurgitation, she is taking Omeprazole. She states when she stands up and moves she feels better.  Insomnia: doing well on Seroquel, falling asleep and staying asleep, no side effects of Seroquel.  Cheilitis: using topical anti-fungal with steroid cream and is doing well now, given by Dr. Elenore Rota  Patient Active Problem List   Diagnosis Date Noted  . Sciatica 04/22/2015  . Attention and concentration deficit 12/01/2014  . B12 deficiency 12/01/2014  . History of bariatric surgery 12/01/2014  . Anxiety, generalized 12/01/2014  . Gastro-esophageal reflux disease without esophagitis 12/01/2014  . H/O elevated lipids 12/01/2014  . H/O: HTN (hypertension) 12/01/2014  . H/O: obesity 12/01/2014  . Hodgkin's disease in remission 12/01/2014  . Current tobacco use 12/01/2014  . Insomnia 03/24/2008  . Abnormal serum level of alkaline phosphatase 11/08/2006  . Chronic recurrent major depressive disorder ([place]) 11/08/2006    Past Surgical History  Procedure Laterality Date  . Abdominal hysterectomy  2008  . Gastric bypass    . Tonsillectomy      Family History  Problem Relation Age of Onset  . Cancer  Mother     colon  . Epilepsy Mother   . Glaucoma Father     Social History   Social History  . Marital Status: Married    Spouse Name: N/A  . Number of Children: N/A  . Years of Education: N/A   Occupational History  . Not on file.   Social History Main Topics  . Smoking status: Current Every Day Smoker -- 1.00 packs/day for 14 years    Types: Cigarettes    Start date: 06/11/2000  . Smokeless tobacco: Never Used  . Alcohol Use: 0.0 oz/week    0 Standard drinks or equivalent per week     Comment: rarely  . Drug Use: No  . Sexual Activity:    Partners: Male   Other Topics Concern  . Not on file   Social History Narrative     Current outpatient prescriptions:  .  ALPRAZolam (XANAX) 0.5 MG tablet, Take 1 tablet (0.5 mg total) by mouth 2 (two) times daily as needed., Disp: 60 tablet, Rfl: 2 .  cloNIDine (CATAPRES) 0.1 MG tablet, TAKE 1 TABLET BY MOUTH 3 TIMES DAILY, Disp: 90 tablet, Rfl: 1 .  cyclobenzaprine (FLEXERIL) 10 MG tablet, Take 1 tablet (10 mg total) by mouth every 8 (eight) hours as needed for muscle spasms., Disp: 90 tablet, Rfl: 1 .  diazepam (VALIUM) 5 MG tablet, , Disp: , Rfl: 0 .  DULoxetine (CYMBALTA) 60 MG capsule, Take 1 capsule (60 mg total) by mouth daily., Disp: 90 capsule, Rfl:  1 .  nystatin-triamcinolone (MYCOLOG II) cream, , Disp: , Rfl: 0 .  omeprazole (PRILOSEC) 10 MG capsule, Take 1 capsule by mouth daily., Disp: , Rfl:  .  QUEtiapine (SEROQUEL) 25 MG tablet, Take 1 tablet (25 mg total) by mouth at bedtime., Disp: 90 tablet, Rfl: 1 .  traMADol (ULTRAM) 50 MG tablet, TAKE 1 TABLET BY MOUTH EVERY 8 HOURS AS NEEDED FOR MODERATE PAIN, Disp: 90 tablet, Rfl: 0  Current facility-administered medications:  .  lidocaine (PF) (XYLOCAINE) 1 % injection 4 mL, 4 mL, Intradermal, Once, Steele Sizer, MD  No Known Allergies   ROS  Constitutional: Negative for fever or weight change.  Respiratory: Negative for cough and shortness of breath.    Cardiovascular: Negative for chest pain or palpitations.  Gastrointestinal: Negative for abdominal pain, no bowel changes.  Musculoskeletal: Negative for gait problem or joint swelling.  Skin: Negative for rash.  Neurological: Negative for dizziness or headache.  No other specific complaints in a complete review of systems (except as listed in HPI above).  Objective  Filed Vitals:   06/17/15 1349  BP: 118/78  Pulse: 122  Temp: 98.7 F (37.1 C)  TempSrc: Oral  Resp: 18  Weight: 163 lb 8 oz (74.163 kg)  SpO2: 96%    Body mass index is 27.21 kg/(m^2).  Physical Exam  Constitutional: Patient appears well-developed and well-nourished. Obese  No distress.  HEENT: head atraumatic, normocephalic, pupils equal and reactive to light, ears TM, neck supple, throat within normal limits Cardiovascular: Normal rate, regular rhythm and normal heart sounds.  No murmur heard. No BLE edema. Pulmonary/Chest: Effort normal and breath sounds normal. No respiratory distress. Abdominal: Soft.  Epigastric pain during palpation  Psychiatric: Patient has a normal mood and affect. behavior is normal. Judgment and thought content normal.   PHQ2/9: Depression screen [facility] 2/9 06/17/2015 04/22/2015 12/01/2014  Decreased Interest 0 0 0  Down, Depressed, Hopeless 0 0 0  PHQ - 2 Score 0 0 0    Fall Risk: Fall Risk  06/17/2015 04/22/2015 12/01/2014  Falls in the past year? No No No    Functional Status Survey: Is the patient deaf or have difficulty hearing?: No Does the patient have difficulty seeing, even when wearing glasses/contacts?: Yes (glasses) Does the patient have difficulty concentrating, remembering, or making decisions?: No Does the patient have difficulty walking or climbing stairs?: No Does the patient have difficulty dressing or bathing?: No Does the patient have difficulty doing errands alone such as visiting a doctor's office or shopping?: No    Assessment & Plan    1.  Insomnia  Doing well on medication  - QUEtiapine (SEROQUEL) 25 MG tablet; Take 1 tablet (25 mg total) by mouth at bedtime.  Dispense: 90 tablet; Refill: 1  2. Chronic recurrent major depressive disorder (HCC)  - DULoxetine (CYMBALTA) 60 MG capsule; Take 1 capsule (60 mg total) by mouth daily.  Dispense: 90 capsule; Refill: 1  3. Gastro-esophageal reflux disease without esophagitis  Continue medication, sees GI, eat small portions, symptoms started after bariatric surgery, had EGD since, had an anastomosis ulcer and stenosis in the past, symptoms are getting worse, advised to go back to GI - Ambulatory referral to Gastroenterology  4. Angular cheilitis  Doing well on medication

## 2015-07-11 ENCOUNTER — Other Ambulatory Visit: Payer: Self-pay | Admitting: Family Medicine

## 2015-07-11 NOTE — Telephone Encounter (Signed)
Patient requesting refill. 

## 2015-07-27 ENCOUNTER — Other Ambulatory Visit: Payer: Self-pay | Admitting: Family Medicine

## 2015-07-27 NOTE — Telephone Encounter (Signed)
Left voice message for patient to inform prescription has been sent to pharmacy but appointment need to be scheduled.

## 2015-07-27 NOTE — Telephone Encounter (Signed)
Patient requesting refill. 

## 2015-08-10 ENCOUNTER — Other Ambulatory Visit: Payer: Self-pay | Admitting: Family Medicine

## 2015-08-10 NOTE — Telephone Encounter (Signed)
Patient requesting refill. 

## 2015-10-04 ENCOUNTER — Telehealth: Payer: Self-pay | Admitting: Family Medicine

## 2015-10-04 NOTE — Telephone Encounter (Signed)
Do you remember where you suggested for Mrs. Thieme to go to talk to a psychologist? In your past notes its says she has seen one in the past but did not want to go back at the time, but it does not say who you recommended. Thanks

## 2015-10-04 NOTE — Telephone Encounter (Signed)
Patient stated that you had suggested a psycologist and she has lost that information. Please return call

## 2015-10-05 NOTE — Telephone Encounter (Signed)
Try Jessica [name]

## 2015-10-06 NOTE — Telephone Encounter (Signed)
Left message on patient vm with Karen [place] name and phone number.

## 2015-10-13 ENCOUNTER — Other Ambulatory Visit: Payer: Self-pay | Admitting: Family Medicine

## 2015-10-17 ENCOUNTER — Ambulatory Visit: Payer: 59 | Admitting: Family Medicine

## 2015-10-21 ENCOUNTER — Ambulatory Visit: Payer: 59 | Admitting: Family Medicine

## 2015-10-24 DIAGNOSIS — F902 Attention-deficit hyperactivity disorder, combined type: Secondary | ICD-10-CM | POA: Diagnosis not present

## 2015-10-26 DIAGNOSIS — F902 Attention-deficit hyperactivity disorder, combined type: Secondary | ICD-10-CM | POA: Diagnosis not present

## 2015-10-27 ENCOUNTER — Ambulatory Visit: Payer: 59 | Admitting: Family Medicine

## 2015-10-28 ENCOUNTER — Encounter: Payer: Self-pay | Admitting: Family Medicine

## 2015-10-28 ENCOUNTER — Ambulatory Visit (INDEPENDENT_AMBULATORY_CARE_PROVIDER_SITE_OTHER): Payer: 59 | Admitting: Family Medicine

## 2015-10-28 VITALS — BP 128/88 | HR 103 | Temp 99.2°F | Resp 20 | Wt 164.9 lb

## 2015-10-28 DIAGNOSIS — F908 Attention-deficit hyperactivity disorder, other type: Secondary | ICD-10-CM | POA: Diagnosis not present

## 2015-10-28 DIAGNOSIS — F411 Generalized anxiety disorder: Secondary | ICD-10-CM | POA: Diagnosis not present

## 2015-10-28 DIAGNOSIS — L509 Urticaria, unspecified: Secondary | ICD-10-CM

## 2015-10-28 DIAGNOSIS — F339 Major depressive disorder, recurrent, unspecified: Secondary | ICD-10-CM | POA: Diagnosis not present

## 2015-10-28 MED ORDER — HYDROXYZINE HCL 25 MG PO TABS: 25.0000 mg | ORAL_TABLET | Freq: Three times a day (TID) | ORAL | Status: DC | PRN

## 2015-10-28 MED ORDER — DULOXETINE HCL 60 MG PO CPEP: 60.0000 mg | ORAL_CAPSULE | Freq: Two times a day (BID) | ORAL | Status: DC

## 2015-10-28 NOTE — Progress Notes (Signed)
Name: Jessica [name]   MRN: [id number]    DOB: 1965-08-18   Date:10/28/2015       Progress Note  Subjective  Chief Complaint  Chief Complaint  Patient presents with  . Anxiety  . Depression  . ADHD    HPI  GAD/Major Depression Disorder: she is getting much worse. She is getting in trouble at work, had to talk her supervisor twice over the past 3 weeks because of conflict with co-workers. Making mistakes at work, unable to focus. She is taking two cymbaltas daily , increased dose on her own. She is taking medications and is able to fall sleep but unable to stay asleep. She has noticed lack of appetite at home, but over eats at work. Exposed to an 50 yo trauma patient this week . Working 6 days weekly and is feeling burned out. She has crying spells. Unable to clean , but taking care of personal hygiene. She denies suicidal thoughts or ideation  ADHD: seen by Dr. Lynann Beaver and diagnosed with ADHD, but I am afraid of adding stimulant at this time. Advised to see psychiatrist .    Patient Active Problem List   Diagnosis Date Noted  . Attention-deficit hyperactivity disorder, other type 10/28/2015  . Sciatica 04/22/2015  . Attention and concentration deficit 12/01/2014  . B12 deficiency 12/01/2014  . History of bariatric surgery 12/01/2014  . Anxiety, generalized 12/01/2014  . Gastro-esophageal reflux disease without esophagitis 12/01/2014  . H/O elevated lipids 12/01/2014  . H/O: HTN (hypertension) 12/01/2014  . H/O: obesity 12/01/2014  . Hodgkin's disease in remission 12/01/2014  . Current tobacco use 12/01/2014  . Insomnia 03/24/2008  . Abnormal serum level of alkaline phosphatase 11/08/2006  . Chronic recurrent major depressive disorder ([place]) 11/08/2006    Past Surgical History  Procedure Laterality Date  . Abdominal hysterectomy  2008  . Gastric bypass    . Tonsillectomy      Family History  Problem Relation Age of Onset  . Cancer Mother     colon  . Epilepsy  Mother   . Glaucoma Father     Social History   Social History  . Marital Status: Married    Spouse Name: N/A  . Number of Children: N/A  . Years of Education: N/A   Occupational History  . Not on file.   Social History Main Topics  . Smoking status: Current Every Day Smoker -- 1.00 packs/day for 14 years    Types: Cigarettes    Start date: 06/11/2000  . Smokeless tobacco: Never Used  . Alcohol Use: 0.0 oz/week    0 Standard drinks or equivalent per week     Comment: rarely  . Drug Use: No  . Sexual Activity:    Partners: Male   Other Topics Concern  . Not on file   Social History Narrative     Current outpatient prescriptions:  .  ALPRAZolam (XANAX) 0.5 MG tablet, TAKE 1 TABLET BY MOUTH TWICE DAILY AS NEEDED, Disp: 60 tablet, Rfl: 2 .  cloNIDine (CATAPRES) 0.1 MG tablet, TAKE 1 TABLET BY MOUTH 3 TIMES DAILY, Disp: 90 tablet, Rfl: 1 .  cyclobenzaprine (FLEXERIL) 10 MG tablet, TAKE 1 TABLET BY MOUTH EVERY 8 HOURS AS NEEDED FOR MUSCLE SPASMS, Disp: 90 tablet, Rfl: 1 .  diazepam (VALIUM) 5 MG tablet, , Disp: , Rfl: 0 .  DULoxetine (CYMBALTA) 60 MG capsule, Take 1 capsule (60 mg total) by mouth daily., Disp: 90 capsule, Rfl: 1 .  nystatin-triamcinolone (MYCOLOG II)  cream, , Disp: , Rfl: 0 .  omeprazole (PRILOSEC) 10 MG capsule, Take 1 capsule by mouth daily., Disp: , Rfl:  .  QUEtiapine (SEROQUEL) 25 MG tablet, Take 1 tablet (25 mg total) by mouth at bedtime., Disp: 90 tablet, Rfl: 1 .  traMADol (ULTRAM) 50 MG tablet, TAKE 1 TABLET BY MOUTH EVERY 8 HOURS AS NEEDED FOR MODERATE PAIN, Disp: 90 tablet, Rfl: 0  Current facility-administered medications:  .  lidocaine (PF) (XYLOCAINE) 1 % injection 4 mL, 4 mL, Intradermal, Once, Steele Sizer, MD  No Known Allergies   ROS  Constitutional: Negative for fever or weight change.  Respiratory: Negative for cough and shortness of breath.   Cardiovascular: Negative for chest pain or palpitations.  Gastrointestinal: Negative  for abdominal pain, no bowel changes.  Musculoskeletal: Negative for gait problem or joint swelling.  Skin: Positive  for rash.  Neurological: Negative for dizziness or headache.  No other specific complaints in a complete review of systems (except as listed in HPI above).  Objective  Filed Vitals:   10/28/15 1054  BP: 128/88  Pulse: 103  Temp: 99.2 F (37.3 C)  TempSrc: Oral  Resp: 20  Weight: 164 lb 14.4 oz (74.798 kg)  SpO2: 96%    Body mass index is 27.44 kg/(m^2).  Physical Exam  Constitutional: Patient appears well-developed and well-nourished. Obese  No distress.  HEENT: head atraumatic, normocephalic, pupils equal and reactive to light,  neck supple, throat within normal limits Cardiovascular: Normal rate, regular rhythm and normal heart sounds.  No murmur heard. No BLE edema. Pulmonary/Chest: Effort normal and breath sounds normal. No respiratory distress. Abdominal: Soft.  There is no tenderness. Psychiatric: Patient has flight of ideas, unable to sit still, agitated, scratching herself. Seems very anxious. Skin: hives and constant scratching in the office  PHQ2/9: Depression screen [facility] 2/9 10/28/2015 06/17/2015 04/22/2015 12/01/2014  Decreased Interest 1 0 0 0  Down, Depressed, Hopeless 3 0 0 0  PHQ - 2 Score 4 0 0 0  Altered sleeping 3 - - -  Tired, decreased energy 3 - - -  Change in appetite 2 - - -  Feeling bad or failure about yourself  2 - - -  Trouble concentrating 3 - - -  Moving slowly or fidgety/restless 3 - - -  Suicidal thoughts 0 - - -  PHQ-9 Score 20 - - -  Difficult doing work/chores Extremely dIfficult - - -   GAD 7 : Generalized Anxiety Score 10/28/2015  Nervous, Anxious, on Edge 3  Control/stop worrying 3  Worry too much - different things 3  Trouble relaxing 3  Restless 3  Easily annoyed or irritable 3  Afraid - awful might happen 3  Total GAD 7 Score 21  Anxiety Difficulty Extremely difficult      Fall Risk: Fall Risk  10/28/2015  06/17/2015 04/22/2015 12/01/2014  Falls in the past year? No No No No      Functional Status Survey: Is the patient deaf or have difficulty hearing?: No Does the patient have difficulty seeing, even when wearing glasses/contacts?: No Does the patient have difficulty concentrating, remembering, or making decisions?: Yes Does the patient have difficulty walking or climbing stairs?: No Does the patient have difficulty dressing or bathing?: No Does the patient have difficulty doing errands alone such as visiting a doctor's office or shopping?: No    Assessment & Plan  1. Anxiety, generalized  - Ambulatory referral to Psychiatry Starting therapy through her job - hydrOXYzine (ATARAX/VISTARIL)  25 MG tablet; Take 1 tablet (25 mg total) by mouth 3 (three) times daily as needed.  Dispense: 60 tablet; Refill: 0 Must stay out of work, [employer] paper will be filled once we receive it. Needs to see psychiatrist asap Atarax will make her sleepy  2. Attention-deficit hyperactivity disorder, other type  - Ambulatory referral to Psychiatry  3. Chronic recurrent major depressive disorder ([place])  - Ambulatory referral to Psychiatry She added suicide hotline to her phone today -Cymbalta 60 mg #60 take twice daily until sees psychiatrist  4. Hives  - hydrOXYzine (ATARAX/VISTARIL) 25 MG tablet; Take 1 tablet (25 mg total) by mouth 3 (three) times daily as needed.  Dispense: 60 tablet; Refill: 0

## 2015-10-31 DIAGNOSIS — F902 Attention-deficit hyperactivity disorder, combined type: Secondary | ICD-10-CM | POA: Diagnosis not present

## 2015-11-08 ENCOUNTER — Ambulatory Visit (INDEPENDENT_AMBULATORY_CARE_PROVIDER_SITE_OTHER): Payer: 59 | Admitting: Family Medicine

## 2015-11-08 ENCOUNTER — Encounter: Payer: Self-pay | Admitting: Family Medicine

## 2015-11-08 VITALS — BP 118/72 | HR 105 | Temp 98.8°F | Resp 18 | Ht 65.0 in | Wt 164.7 lb

## 2015-11-08 DIAGNOSIS — G4700 Insomnia, unspecified: Secondary | ICD-10-CM

## 2015-11-08 DIAGNOSIS — K219 Gastro-esophageal reflux disease without esophagitis: Secondary | ICD-10-CM | POA: Diagnosis not present

## 2015-11-08 DIAGNOSIS — L509 Urticaria, unspecified: Secondary | ICD-10-CM

## 2015-11-08 DIAGNOSIS — M21619 Bunion of unspecified foot: Secondary | ICD-10-CM | POA: Diagnosis not present

## 2015-11-08 DIAGNOSIS — F411 Generalized anxiety disorder: Secondary | ICD-10-CM

## 2015-11-08 MED ORDER — HYDROXYZINE HCL 25 MG PO TABS: 25.0000 mg | ORAL_TABLET | Freq: Three times a day (TID) | ORAL | Status: DC | PRN

## 2015-11-08 MED ORDER — ALPRAZOLAM 0.5 MG PO TABS: 0.5000 mg | ORAL_TABLET | Freq: Two times a day (BID) | ORAL | Status: DC | PRN

## 2015-11-08 MED ORDER — OMEPRAZOLE 40 MG PO CPDR: 40.0000 mg | DELAYED_RELEASE_CAPSULE | Freq: Every day | ORAL | Status: DC

## 2015-11-08 NOTE — Progress Notes (Signed)
Name: Jessica [name]   MRN: [id number]    DOB: November 24, 1965   Date:11/08/2015       Progress Note  Subjective  Chief Complaint  Chief Complaint  Patient presents with  . Anxiety    patient is here for her 1 week f/u    HPI  GAD/Major Depression Disorder: she is feeling a little better since started on Atarax and higher dose of Cymbalta She is under FMLA since her last visit on 10/25/2015 because she  getting in trouble at work, had to talk her supervisor twice over the few weeks prior  because of conflict with co-workers. She was [place] mistakes at work, unable to focus. S  She is taking medications and is able to fall sleep but unable to stay asleep. She has been exercising and using adult coloring books to keep her mind busy. Exposed to an 50 yo trauma the week before her anxiety got much worse. She was working 6 days weekly and was feeling burned out. She still has  crying spells. She has been cleaning her house constantly since out of work, feels more anxious when she tries to leave the house. Trying not to see anybody and not returning her phone calls. . She denies suicidal thoughts or ideation  ADHD: seen by Dr. Lynann Beaver and diagnosed with ADHD, but I am afraid of adding stimulant at this time. She has an appointment with Dr. Clarice Pole June 6th, 2017 .   Bunion: she has noticed her bunions are getting worse, painful on right side, she would like to see podiatrist  GERD: she has burning sensation on epigastric area, intermittently, improved since she changed to decaf coffee, taking Nexium now but would like to switch to omeprazole because of cost. Seen by [facility] and had a scope about one year ago  Patient Active Problem List   Diagnosis Date Noted  . Bunion of great toe 11/08/2015  . Attention-deficit hyperactivity disorder, other type 10/28/2015  . Sciatica 04/22/2015  . Attention and concentration deficit 12/01/2014  . B12 deficiency 12/01/2014  . History of bariatric surgery 12/01/2014   . Anxiety, generalized 12/01/2014  . Gastro-esophageal reflux disease without esophagitis 12/01/2014  . H/O elevated lipids 12/01/2014  . H/O: HTN (hypertension) 12/01/2014  . H/O: obesity 12/01/2014  . Hodgkin's disease in remission 12/01/2014  . Current tobacco use 12/01/2014  . Insomnia 03/24/2008  . Abnormal serum level of alkaline phosphatase 11/08/2006  . Chronic recurrent major depressive disorder ([place]) 11/08/2006    Past Surgical History  Procedure Laterality Date  . Abdominal hysterectomy  2008  . Gastric bypass    . Tonsillectomy      Family History  Problem Relation Age of Onset  . Cancer Mother     colon  . Epilepsy Mother   . Glaucoma Father     Social History   Social History  . Marital Status: Married    Spouse Name: N/A  . Number of Children: N/A  . Years of Education: N/A   Occupational History  . Not on file.   Social History Main Topics  . Smoking status: Current Every Day Smoker -- 1.00 packs/day for 14 years    Types: Cigarettes    Start date: 06/11/2000  . Smokeless tobacco: Never Used  . Alcohol Use: 0.0 oz/week    0 Standard drinks or equivalent per week     Comment: rarely  . Drug Use: No  . Sexual Activity:    Partners: Male   Other  Topics Concern  . Not on file   Social History Narrative     Current outpatient prescriptions:  .  ALPRAZolam (XANAX) 0.5 MG tablet, Take 1 tablet (0.5 mg total) by mouth 2 (two) times daily as needed., Disp: 60 tablet, Rfl: 0 .  cloNIDine (CATAPRES) 0.1 MG tablet, TAKE 1 TABLET BY MOUTH 3 TIMES DAILY, Disp: 90 tablet, Rfl: 1 .  cyclobenzaprine (FLEXERIL) 10 MG tablet, TAKE 1 TABLET BY MOUTH EVERY 8 HOURS AS NEEDED FOR MUSCLE SPASMS, Disp: 90 tablet, Rfl: 1 .  DULoxetine (CYMBALTA) 60 MG capsule, Take 1 capsule (60 mg total) by mouth 2 (two) times daily., Disp: 60 capsule, Rfl: 0 .  hydrOXYzine (ATARAX/VISTARIL) 25 MG tablet, Take 1 tablet (25 mg total) by mouth 3 (three) times daily as needed.,  Disp: 90 tablet, Rfl: 0 .  nystatin-triamcinolone (MYCOLOG II) cream, , Disp: , Rfl: 0 .  omeprazole (PRILOSEC) 40 MG capsule, Take 1 capsule (40 mg total) by mouth daily., Disp: 30 capsule, Rfl: 2 .  QUEtiapine (SEROQUEL) 25 MG tablet, Take 1 tablet (25 mg total) by mouth at bedtime., Disp: 90 tablet, Rfl: 1 .  traMADol (ULTRAM) 50 MG tablet, TAKE 1 TABLET BY MOUTH EVERY 8 HOURS AS NEEDED FOR MODERATE PAIN, Disp: 90 tablet, Rfl: 0  Current facility-administered medications:  .  lidocaine (PF) (XYLOCAINE) 1 % injection 4 mL, 4 mL, Intradermal, Once, Steele Sizer, MD  No Known Allergies   ROS  Ten systems reviewed and is negative except as mentioned in HPI   Objective  Filed Vitals:   11/08/15 1202  BP: 118/72  Pulse: 105  Temp: 98.8 F (37.1 C)  TempSrc: Oral  Resp: 18  Height: 5\' 5"  (1.651 m)  Weight: 164 lb 11.2 oz (74.707 kg)  SpO2: 98%    Body mass index is 27.41 kg/(m^2).  Physical Exam  Constitutional: Patient appears well-developed and well-nourished.  No distress.  HEENT: head atraumatic, normocephalic, pupils equal and reactive to light,  neck supple, throat within normal limits Cardiovascular: Normal rate, regular rhythm and normal heart sounds.  No murmur heard. No BLE edema. Pulmonary/Chest: Effort normal and breath sounds normal. No respiratory distress. Abdominal: Soft.  There is no tenderness. Psychiatric: Still anxious and fidgety but not as bad.   PHQ2/9: Depression screen [facility] 2/9 11/08/2015 10/28/2015 06/17/2015 04/22/2015 12/01/2014  Decreased Interest 1 1 0 0 0  Down, Depressed, Hopeless 3 3 0 0 0  PHQ - 2 Score 4 4 0 0 0  Altered sleeping 2 3 - - -  Tired, decreased energy 1 3 - - -  Change in appetite 0 2 - - -  Feeling bad or failure about yourself  1 2 - - -  Trouble concentrating 2 3 - - -  Moving slowly or fidgety/restless 2 3 - - -  Suicidal thoughts 0 0 - - -  PHQ-9 Score 12 20 - - -  Difficult doing work/chores Extremely dIfficult  Extremely dIfficult - - -    Fall Risk: Fall Risk  11/08/2015 10/28/2015 06/17/2015 04/22/2015 12/01/2014  Falls in the past year? No No No No No     Functional Status Survey: Is the patient deaf or have difficulty hearing?: No Does the patient have difficulty seeing, even when wearing glasses/contacts?: No Does the patient have difficulty concentrating, remembering, or making decisions?: Yes Does the patient have difficulty walking or climbing stairs?: No Does the patient have difficulty dressing or bathing?: No Does the patient have difficulty doing  errands alone such as visiting a doctor's office or shopping?: No    Assessment & Plan  1. Bunion of great toe  - Ambulatory referral to Podiatry  2. Anxiety, generalized  - hydrOXYzine (ATARAX/VISTARIL) 25 MG tablet; Take 1 tablet (25 mg total) by mouth 3 (three) times daily as needed.  Dispense: 90 tablet; Refill: 0 - ALPRAZolam (XANAX) 0.5 MG tablet; Take 1 tablet (0.5 mg total) by mouth 2 (two) times daily as needed.  Dispense: 60 tablet; Refill: 0  3. Insomnia  Continue medication   4. Hives  improved - hydrOXYzine (ATARAX/VISTARIL) 25 MG tablet; Take 1 tablet (25 mg total) by mouth 3 (three) times daily as needed.  Dispense: 90 tablet; Refill: 0  5. Gastro-esophageal reflux disease without esophagitis  - omeprazole (PRILOSEC) 40 MG capsule; Take 1 capsule (40 mg total) by mouth daily.  Dispense: 30 capsule; Refill: 2

## 2015-11-15 ENCOUNTER — Ambulatory Visit (INDEPENDENT_AMBULATORY_CARE_PROVIDER_SITE_OTHER): Payer: 59 | Admitting: Psychiatry

## 2015-11-15 ENCOUNTER — Encounter: Payer: Self-pay | Admitting: Psychiatry

## 2015-11-15 VITALS — BP 142/88 | HR 125 | Temp 99.2°F | Ht 65.0 in | Wt 167.6 lb

## 2015-11-15 DIAGNOSIS — F39 Unspecified mood [affective] disorder: Secondary | ICD-10-CM | POA: Diagnosis not present

## 2015-11-15 DIAGNOSIS — F411 Generalized anxiety disorder: Secondary | ICD-10-CM | POA: Diagnosis not present

## 2015-11-15 DIAGNOSIS — G4700 Insomnia, unspecified: Secondary | ICD-10-CM | POA: Diagnosis not present

## 2015-11-15 MED ORDER — ALPRAZOLAM 0.5 MG PO TABS: 0.5000 mg | ORAL_TABLET | Freq: Two times a day (BID) | ORAL | Status: DC | PRN

## 2015-11-15 MED ORDER — DULOXETINE HCL 30 MG PO CPEP: 30.0000 mg | ORAL_CAPSULE | Freq: Every day | ORAL | Status: DC

## 2015-11-15 MED ORDER — QUETIAPINE FUMARATE 100 MG PO TABS
100.0000 mg | ORAL_TABLET | Freq: Every day | ORAL | Status: DC
Start: 2015-11-15 — End: 2015-11-30

## 2015-11-15 MED ORDER — LAMOTRIGINE 25 MG PO TABS: 25.0000 mg | ORAL_TABLET | Freq: Every day | ORAL | Status: DC

## 2015-11-15 NOTE — Progress Notes (Signed)
Psychiatric Initial Adult Assessment   Patient Identification: Jessica [name] MRN:  [id number] Date of Evaluation:  11/15/2015 Referral Source: PCP Chief Complaint:   Chief Complaint    Establish Care; Anxiety; Depression     Visit Diagnosis:    ICD-9-CM ICD-10-CM   1. Episodic mood disorder ([place]) 296.90 F39     History of Present Illness:    Patient is a 50 year old married female with history of mood symptoms presented for initial assessment. She was referred by her primary care physician. She currently works in a hospital setting. Patient reported that she has been having problems with her coworkers and is unable to finish her task as she loses temper quickly. Patient reported that she signed up for extra shifts during the month of May and was becoming extremely tired and exhausted. She reported that she started having contacted the coworkers. She was making mistakes at work and was unable to focus and concentrate. She saw her primary care physician who titrated the dose of Cymbalta and she was taking Cymbalta 120 mg on a daily basis. She started having increased sweating and was started on clonidine. Patient reported that she started having more swings anger and was given Seroquel 25 mg at bedtime. Patient reported that she continues to lose temper at patient's in the workplace. She was placed on FMLA by the primary care physician as she was unable to handle her work. She did the ADHD testing by the local psychologist who diagnosed her with ADHD  During my interview patient reported that she has racing thoughts, spending sprees as she bought $200 worth of food from [employer], impulsivity irritability and unable to concentrate. She reported that she cannot sleep at night as her mind cannot stop pacing. She stated that she has mood swings and she is not on the right combination of the medication. She reported that she wants to have her medications adjusted at this time. She drinks 2 pots of  decaffeinated coffee on a daily basis. She reported that she cannot rest  and sit still.   Associated Signs/Symptoms: Depression Symptoms:  insomnia, psychomotor agitation, fatigue, difficulty concentrating, anxiety, disturbed sleep, (Hypo) Manic Symptoms:  Distractibility, Flight of Ideas, Community education officer, Impulsivity, Irritable Mood, Labiality of Mood, Anxiety Symptoms:  Excessive Worry, Panic Symptoms, Psychotic Symptoms:  none PTSD Symptoms: Negative NA  Past Psychiatric History:  No h/o admission No h/o SA    Previous Psychotropic Medications:  Prozac Paxil Effexor- worked best, increased LFT Cymbalta  seroquel xanax  Substance Abuse History in the last 12 months:  Yes.    Social drinker   Consequences of Substance Abuse: Negative NA  Past Medical History:  Past Medical History  Diagnosis Date  . Hypertension   . Anxiety   . Depression   . ADHD (attention deficit hyperactivity disorder)     Past Surgical History  Procedure Laterality Date  . Abdominal hysterectomy  2008  . Gastric bypass    . Tonsillectomy    . Carpal tunnel release      Family Psychiatric History:  She denied any family history of psychiatric illness   Family History:  Family History  Problem Relation Age of Onset  . Cancer Mother     colon  . Epilepsy Mother   . Glaucoma Father     Social History:   Social History   Social History  . Marital Status: Married    Spouse Name: N/A  . Number of Children: N/A  . Years of Education: N/A  Social History Main Topics  . Smoking status: Current Every Day Smoker -- 1.00 packs/day for 14 years    Types: Cigarettes    Start date: 06/11/2000  . Smokeless tobacco: Never Used  . Alcohol Use: 0.0 oz/week    0 Standard drinks or equivalent per week     Comment: rarely  . Drug Use: No  . Sexual Activity:    Partners: Male   Other Topics Concern  . None   Social History Narrative    Additional Social  History:  Married x 17 years for first times.  Has children - 2 , 66 and 23  Now married x 9 years - Has good relationship with husband   Allergies:  No Known Allergies  Metabolic Disorder Labs: Lab Results  Component Value Date   HGBA1C 6.3 10/24/2011   No results found for: PROLACTIN Lab Results  Component Value Date   CHOL 144 10/29/2014   TRIG 91 10/29/2014   HDL 49 10/29/2014   CHOLHDL 2.9 10/29/2014   VLDL 18 10/29/2014   LDLCALC 77 10/29/2014   LDLCALC 53 07/08/2013     Current Medications: Current Outpatient Prescriptions  Medication Sig Dispense Refill  . ALPRAZolam (XANAX) 0.5 MG tablet Take 1 tablet (0.5 mg total) by mouth 2 (two) times daily as needed. 60 tablet 0  . cloNIDine (CATAPRES) 0.1 MG tablet TAKE 1 TABLET BY MOUTH 3 TIMES DAILY 90 tablet 1  . cyclobenzaprine (FLEXERIL) 10 MG tablet TAKE 1 TABLET BY MOUTH EVERY 8 HOURS AS NEEDED FOR MUSCLE SPASMS 90 tablet 1  . DULoxetine (CYMBALTA) 60 MG capsule Take 1 capsule (60 mg total) by mouth 2 (two) times daily. 60 capsule 0  . omeprazole (PRILOSEC) 40 MG capsule Take 1 capsule (40 mg total) by mouth daily. 30 capsule 2  . QUEtiapine (SEROQUEL) 25 MG tablet Take 1 tablet (25 mg total) by mouth at bedtime. 90 tablet 1  . traMADol (ULTRAM) 50 MG tablet TAKE 1 TABLET BY MOUTH EVERY 8 HOURS AS NEEDED FOR MODERATE PAIN 90 tablet 0  . hydrOXYzine (ATARAX/VISTARIL) 25 MG tablet Take 1 tablet (25 mg total) by mouth 3 (three) times daily as needed. (Patient not taking: Reported on 11/15/2015) 90 tablet 0  . nystatin-triamcinolone (MYCOLOG II) cream Reported on 11/15/2015  0   Current Facility-Administered Medications  Medication Dose Route Frequency Provider Last Rate Last Dose  . lidocaine (PF) (XYLOCAINE) 1 % injection 4 mL  4 mL Intradermal Once Steele Sizer, MD        Neurologic: Headache: No Seizure: No Paresthesias:No  Musculoskeletal: Strength & Muscle Tone: within normal limits Gait & Station:  normal Patient leans: N/A  Psychiatric Specialty Exam: ROS  Blood pressure 142/88, pulse 125, temperature 99.2 F (37.3 C), temperature source Tympanic, height 5\' 5"  (1.651 m), weight 167 lb 9.6 oz (76.023 kg), SpO2 93 %.Body mass index is 27.89 kg/(m^2).  General Appearance: Well Groomed  Eye Contact:  Fair  Speech:  Pressured  Volume:  Normal  Mood:  Anxious and Irritable  Affect:  Congruent  Thought Process:  Disorganized  Orientation:  Full (Time, Place, and Person)  Thought Content:  Tangential  Suicidal Thoughts:  No  Homicidal Thoughts:  No  Memory:  Immediate;   Fair Recent;   Fair Remote;   Fair  Judgement:  Impaired  Insight:  Fair  Psychomotor Activity:  Increased  Concentration:  Concentration: Fair and Attention Span: Fair  Recall:  [employer] [place]: Fair  Akathisia:  No  Handed:  Right  AIMS (if indicated):    Assets:  Communication Skills Desire for Improvement Physical Health Social Support Transportation  ADL's:  Intact  Cognition: WNL  Sleep:      Treatment Plan Summary: Medication management   Discussed with patient at length about the medications treatment risks benefits and alternatives I will decrease Cymbalta 30 mg by mouth daily as it is causing her agitation worsening of her more symptoms and her hyperhidrosis Started her on Seroquel 100 mg by mouth daily at bedtime Started her on lamotrigine 25 mg daily for mood symptoms She will take Xanax 0.5 mg daily when necessary for her anxiety and patient has supply of the medication Follow-up in 2 weeks or earlier depending on her symptoms  She is currently on FMLA and her primary care physician will release her to go to work when she becomes clinically stable   More than 50% of the time spent in psychoeducation, counseling and coordination of care.    This note was generated in part or whole with voice recognition software. Voice regonition is usually quite accurate but  there are transcription errors that can and very often do occur. I apologize for any typographical errors that were not detected and corrected.    Rainey Pines, MD 6/6/20172:54 PM

## 2015-11-22 ENCOUNTER — Encounter: Payer: Self-pay | Admitting: Podiatry

## 2015-11-22 ENCOUNTER — Ambulatory Visit (INDEPENDENT_AMBULATORY_CARE_PROVIDER_SITE_OTHER): Payer: 59 | Admitting: Podiatry

## 2015-11-22 ENCOUNTER — Ambulatory Visit (INDEPENDENT_AMBULATORY_CARE_PROVIDER_SITE_OTHER): Payer: 59

## 2015-11-22 VITALS — BP 116/77 | HR 115 | Resp 18

## 2015-11-22 DIAGNOSIS — M2010 Hallux valgus (acquired), unspecified foot: Secondary | ICD-10-CM | POA: Diagnosis not present

## 2015-11-22 DIAGNOSIS — R52 Pain, unspecified: Secondary | ICD-10-CM | POA: Diagnosis not present

## 2015-11-22 IMAGING — US US RENAL
1 series · 14 of 25 positions shown · non-contrast
Comparison: CT 01/23/2011

CLINICAL DATA: Right flank pain for 1 day.

EXAM:
RENAL / URINARY TRACT ULTRASOUND COMPLETE

[Series 1: us renal · 0.21mm/px · 14 of 39 slices shown]
[im 1/39]
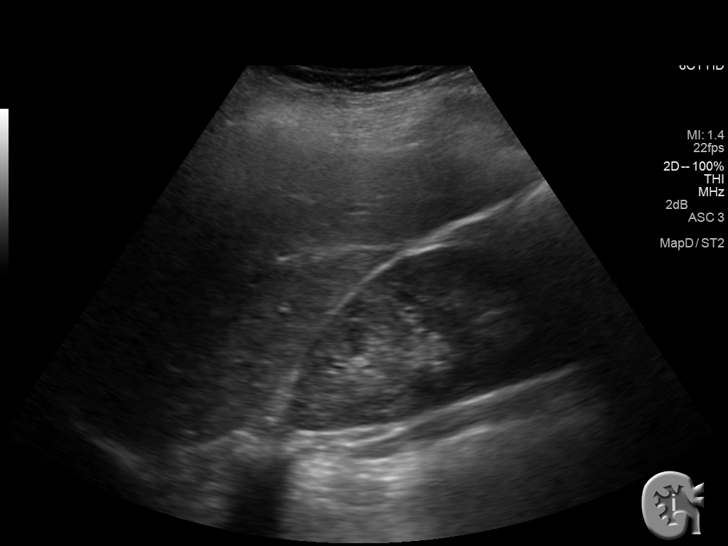
[im 4/39]
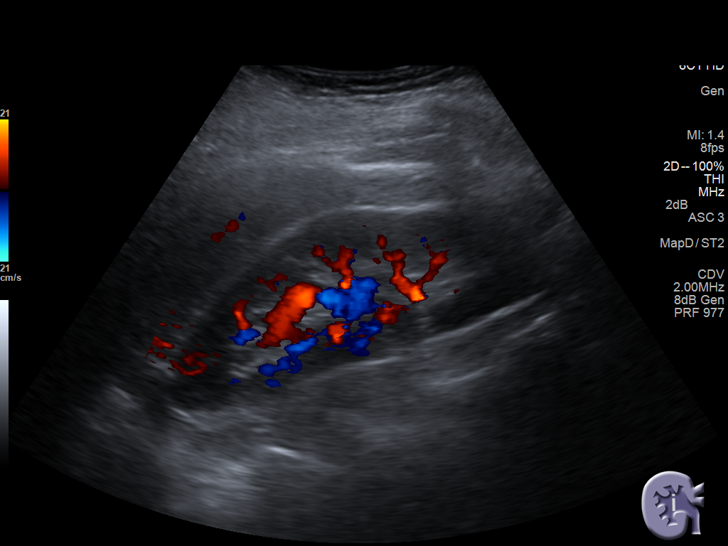
[im 7/39]
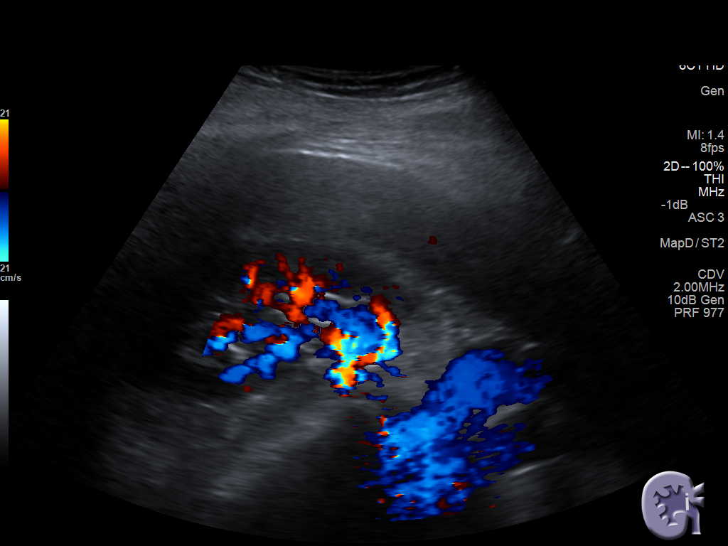
[im 10/39]
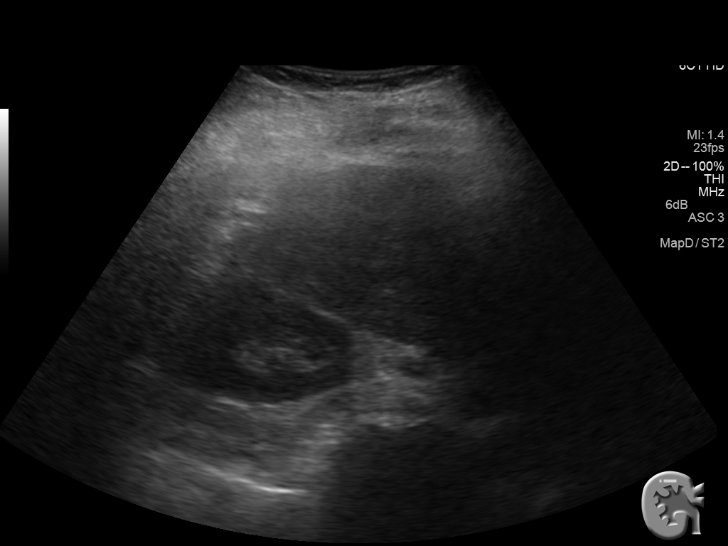
[im 13/39]
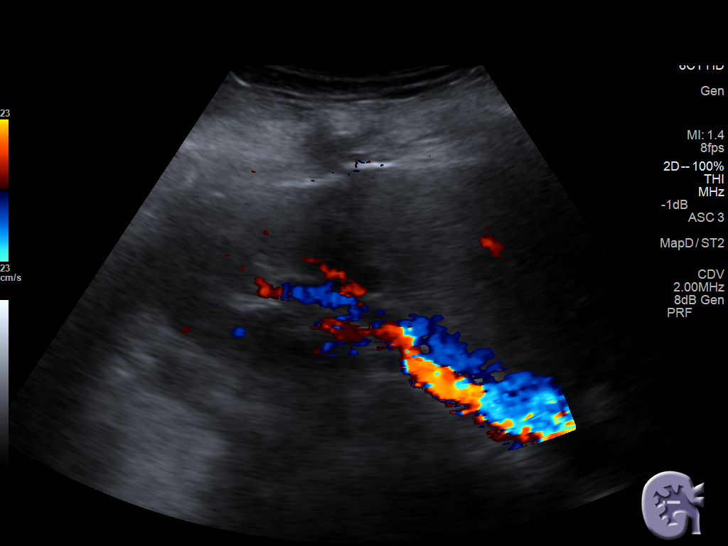
[im 15/39]
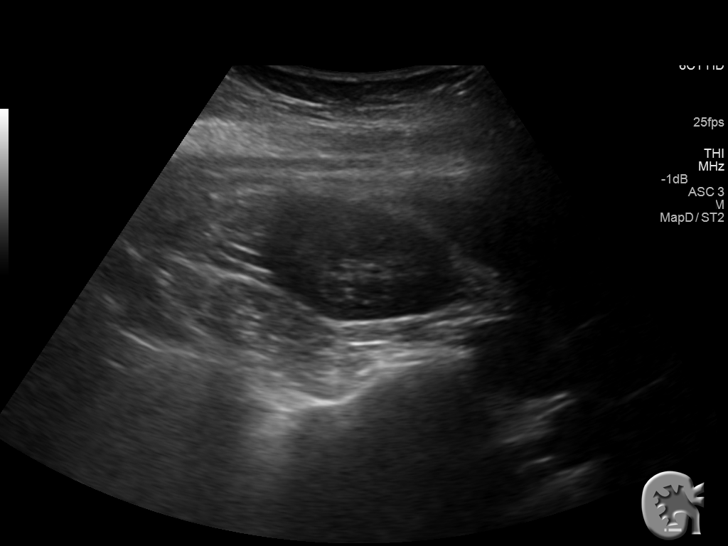
[im 18/39]
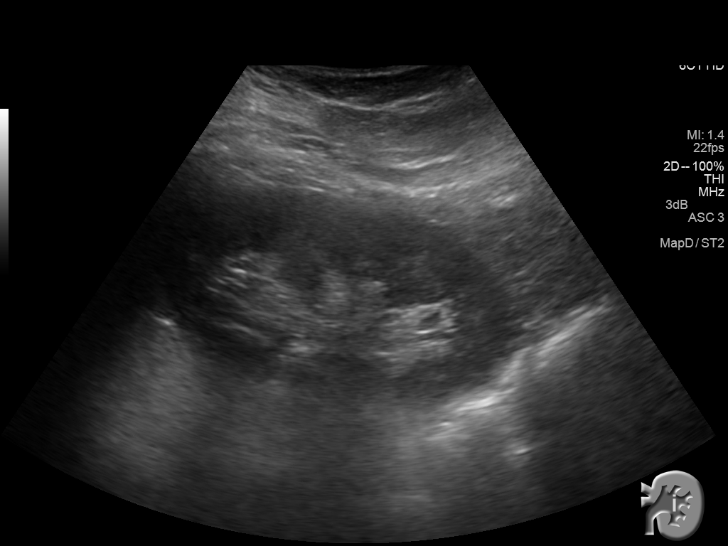
[im 21/39]
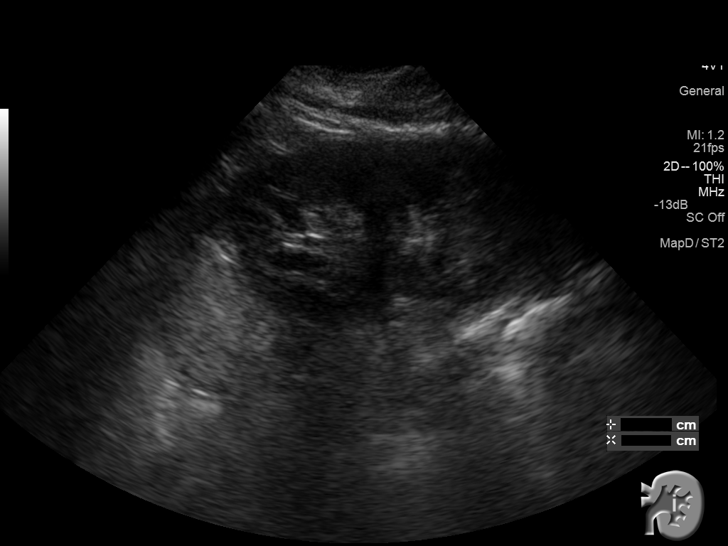
[im 24/39]
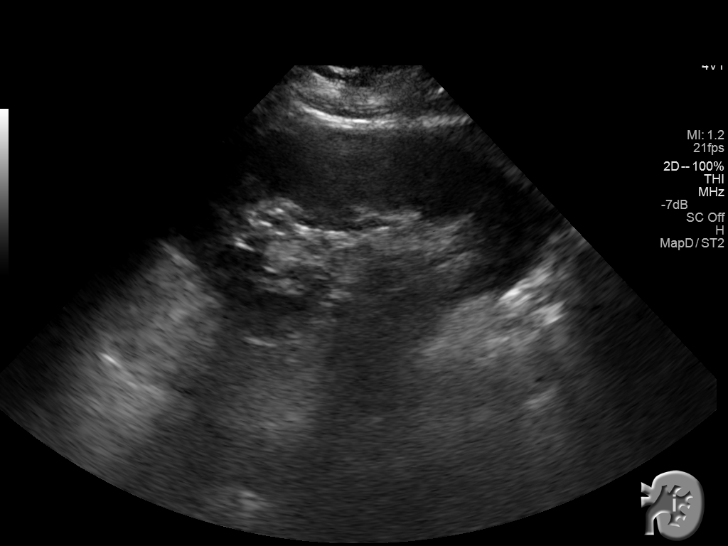
[im 26/39]
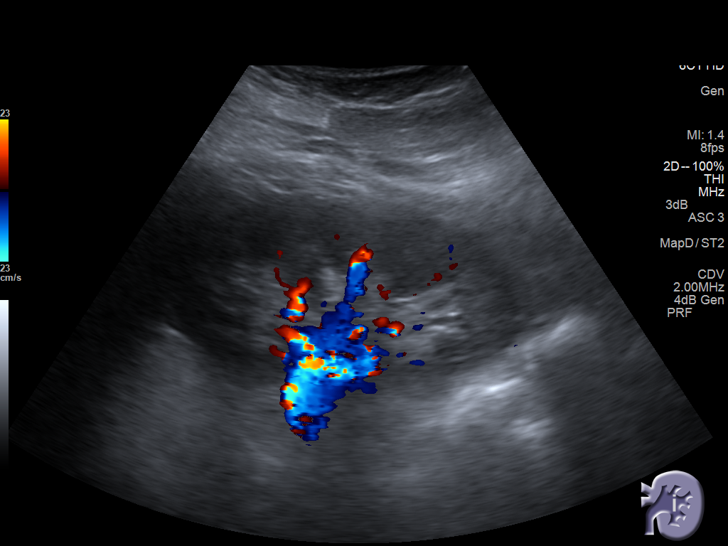
[im 29/39]
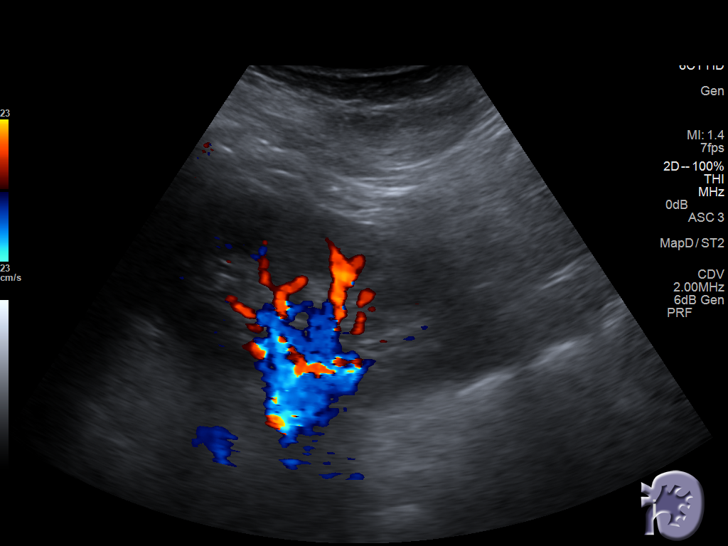
[im 32/39]
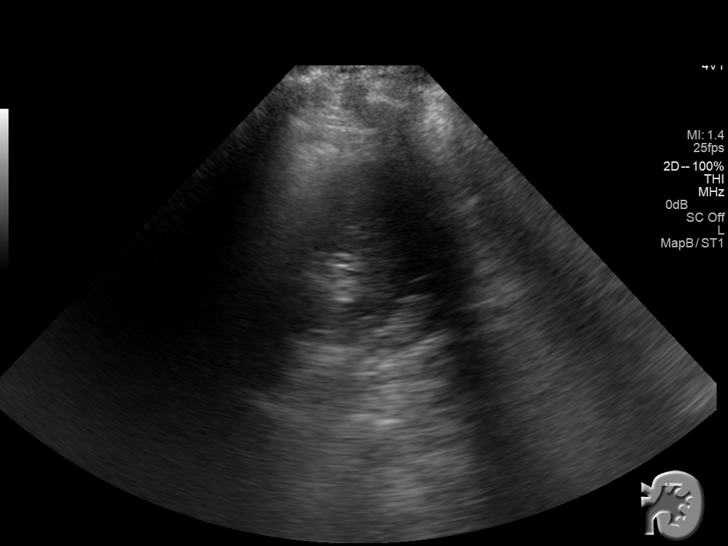
[im 35/39]
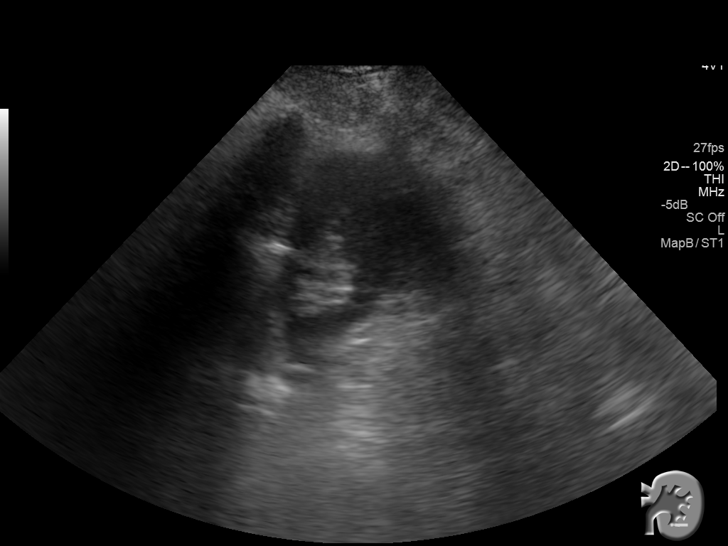
[im 39/39]
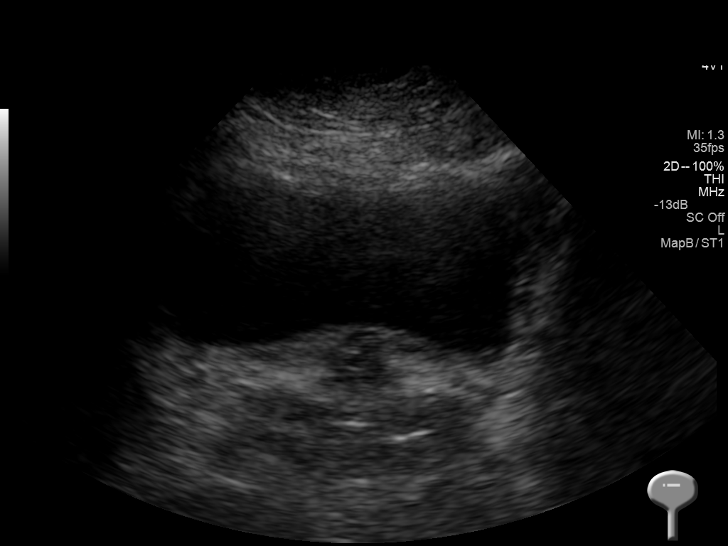

[14 of 25 positions shown; findings below may reference images not displayed]

FINDINGS: Right Kidney:

Length: 11.0 cm. Echogenicity within normal limits. No mass or
hydronephrosis visualized.

Left Kidney:

Length: 10.6 cm. Echogenicity within normal limits. No mass or
hydronephrosis visualized. There is a echogenic left adrenal mass
measuring up to 4.6 cm. This is unchanged from prior CT and is a
benign fat containing lesion such as a myelolipoma. This does not
require additional imaging evaluation.

Bladder:

Appears normal for degree of bladder distention.
IMPRESSION: Normal kidneys. Negative for hydronephrosis. Benign fat containing
left adrenal mass again noted, not requiring additional evaluation.

## 2015-11-22 NOTE — Progress Notes (Signed)
   Subjective:    Patient ID: Jessica [name], female    DOB: [birth year], 50 y.o.   MRN: [id number]  HPI  50 year old female presents the office they for concerns of right bunion pain greater than left side. His been ongoing for quite some time and she has pain in a daily basis with pressure in shoes over on the bunion. She has tried changing shoes as well as icing and elevation helps him all that she continues to have pain. She also were closed in shoes at work and this hurts on a daily basis.   Review of Systems  All other systems reviewed and are negative.      Objective:   Physical Exam General: AAO x3, NAD  Dermatological: Skin is warm, dry and supple bilateral. Nails x 10 are well manicured; remaining integument appears unremarkable at this time. There are no open sores, no preulcerative lesions, no rash or signs of infection present.  Vascular: Dorsalis Pedis artery and Posterior Tibial artery pedal pulses are 2/4 bilateral with immedate capillary fill time. Pedal hair growth present. There is no pain with calf compression, swelling, warmth, erythema.   Neruologic: Grossly intact via light touch bilateral. Vibratory intact via tuning fork bilateral. Protective threshold with Semmes Wienstein monofilament intact to all pedal sites bilateral. Patellar and Achilles deep tendon reflexes 2+ bilateral. No Babinski or clonus noted bilateral.   Musculoskeletal: Moderate HAV is present with tenderness along the medial aspect of the first metatarsal head on the right side. There is irritation, erythema, on the medial first metatarsal head from shoe gear. There is no pain or crepitation with first MPJ range of motion. There is no hypermobility present. No other areas of tenderness bilaterally. MMT 5/5.  Gait: Unassisted, Nonantalgic.      Assessment & Plan:  50 year old female bilateral bunions right greater than left -Treatment options discussed including all alternatives, risks, and  complications -Etiology of symptoms were discussed -X-rays were obtained and reviewed with the patient. Moderate HAV is present. No evidence of acute fracture. -I long discussion with the patient regards to both conservative and surgical treatment options. She has tried change her shoes, offloading, icing without much relief. At this time she presents today asking for surgical intervention. I discussed with her at length that as she is currently being treated for active psychiatric issues she will need to have clearance from her primary care doctor and or psychiatrist. -I discussed with her Liane Comber bunionectomy right foot with screw fixation. I discussed with her the surgical approach as well as the postoperative recovery. -The incision placement as well as the postoperative course was discussed with the patient. I discussed risks of the surgery which include, but not limited to, infection, bleeding, pain, swelling, need for further surgery, delayed or nonhealing, painful or ugly scar, numbness or sensation changes, over/under correction, recurrence, transfer lesions, further deformity, hardware failure, DVT/PE, loss of toe/foot. Patient understands these risks and wishes to proceed with surgery. The surgical consent was reviewed with the patient all 3 pages were signed. No promises or guarantees were given to the outcome of the procedure. All questions were answered to the best of my ability. Before the surgery the patient was encouraged to call the office if there is any further questions. The surgery will be performed at the [facility] on an outpatient basis pending clearance.   Celesta Gentile, DPM

## 2015-11-22 NOTE — Patient Instructions (Signed)

## 2015-11-25 ENCOUNTER — Telehealth: Payer: Self-pay | Admitting: *Deleted

## 2015-11-25 ENCOUNTER — Encounter: Payer: Self-pay | Admitting: *Deleted

## 2015-11-25 NOTE — Telephone Encounter (Signed)
"  I saw Dr. Jacqualyn Posey Tuesday afternoon regarding some problems with my foot.  He was going to try and set me up with surgery.  I have a few questions before we go forward with this.  Call me back when you get this message or at your earliest convenience."

## 2015-11-25 NOTE — Telephone Encounter (Signed)
I'm returning your call.  "Yes, I wanted to know if you have received medical clearance from my doctors, Dr. Gretel Acre and Dr. Ancil Boozer."  I have not received authorization at this time.  I actually sent out the letters today.  "I guess you have to wait until you hear from them before you can schedule my surgery?"  I have you scheduled for 12/07/2015.  You can go ahead and register.  "I know I have a high deductible.  Do I have to pay something up front or can I make payment arrangements?"  You can make payment arrangements.  "So will you call me once you get clearance so I'll know if surgery is on?"  I will only call you if medical clearance is denied.  "Okay, sounds great!"

## 2015-11-29 ENCOUNTER — Telehealth: Payer: Self-pay | Admitting: *Deleted

## 2015-11-29 NOTE — Telephone Encounter (Signed)
"  I just got off the phone with Dr. Anola Gurney office for clearance.  There was apparently a misunderstanding in the nurses' letter.  She didn't read all my chart.  I am seeing Dr. Gretel Acre tomorrow like I said.  It's not 4:00, I moved it up to 2:30.  You will receive a letter through the fax giving me medical clearance.  Keep me on the schedule, please for the 28th.  If you have any questions or want me to feel better, call and let me know you received it."

## 2015-11-29 NOTE — Telephone Encounter (Signed)
I'm calling to let you know we're going to have to postpone your surgery for June 28.  "Oh no why, I have already registered, took time off and everything!"  Dr. Gretel Acre said she can't give medical clearance until you have your 2 week follow-up and she sees how you are doing.  "My 2 week follow-up is tomorrow.  I see her tomorrow.  Can we just leave it on?  I'm doing fine on the medicine that she prescribed.  Nothing needs to be changed."  I put it back on but we have to get a medical clearance letter from her tomorrow to do so.  "That won't be a problem.  I'll make sure."  I called Caren Griffins at [facility] to inform her.  She requested I call her tomorrow once I get clearance from Dr. Gretel Acre.

## 2015-11-30 ENCOUNTER — Ambulatory Visit: Payer: 59 | Admitting: Podiatry

## 2015-11-30 ENCOUNTER — Encounter: Payer: Self-pay | Admitting: Psychiatry

## 2015-11-30 ENCOUNTER — Ambulatory Visit (INDEPENDENT_AMBULATORY_CARE_PROVIDER_SITE_OTHER): Payer: 59 | Admitting: Psychiatry

## 2015-11-30 ENCOUNTER — Other Ambulatory Visit: Payer: Self-pay | Admitting: Family Medicine

## 2015-11-30 VITALS — BP 122/78 | HR 102 | Temp 98.1°F | Ht 65.0 in | Wt 165.4 lb

## 2015-11-30 DIAGNOSIS — G4700 Insomnia, unspecified: Secondary | ICD-10-CM | POA: Diagnosis not present

## 2015-11-30 DIAGNOSIS — F39 Unspecified mood [affective] disorder: Secondary | ICD-10-CM

## 2015-11-30 DIAGNOSIS — F411 Generalized anxiety disorder: Secondary | ICD-10-CM | POA: Diagnosis not present

## 2015-11-30 MED ORDER — QUETIAPINE FUMARATE 25 MG PO TABS: 25.0000 mg | ORAL_TABLET | Freq: Two times a day (BID) | ORAL | Status: DC

## 2015-11-30 MED ORDER — ALPRAZOLAM 0.5 MG PO TABS: 0.5000 mg | ORAL_TABLET | Freq: Two times a day (BID) | ORAL | Status: DC | PRN

## 2015-11-30 MED ORDER — QUETIAPINE FUMARATE 100 MG PO TABS: 100.0000 mg | ORAL_TABLET | Freq: Every day | ORAL | Status: DC

## 2015-11-30 MED ORDER — DULOXETINE HCL 30 MG PO CPEP: 30.0000 mg | ORAL_CAPSULE | Freq: Every day | ORAL | Status: DC

## 2015-11-30 NOTE — Progress Notes (Signed)
Psychiatric MD Progress Note   Patient Identification: Jessica [name] MRN:  [id number] Date of Evaluation:  11/30/2015 Referral Source: PCP Chief Complaint:   Chief Complaint    Follow-up; Medication Refill     Visit Diagnosis:    ICD-9-CM ICD-10-CM   1. Episodic mood disorder ([place]) 296.90 F39   2. Insomnia 780.52 G47.00     History of Present Illness:    Patient is a 49 year old married female with history of mood symptoms presented for Follow-up.Marland Kitchen She was referred by her primary care physician. She reported that she is getting ready for her foot surgery done and has already applied for the FMLA. She reported that she is upset as she has received a letter from our practice that she is a new patient and we cannot clear her for the surgery. She reported that her for Dr. is trying to postpone her surgery and she is upset about the same. Patient reported that she continues to lose temper and she thinks that her lamotrigine is not helping her. She currently denied having any control on her more symptoms. We discussed about the medications in detail. She reported that the Seroquel is helping her and she has been sleeping well at night. She currently denied having any suicidal ideations or plans. She reported that she has taken some pills of Xanax over the Father's Day on Saturday as she was unable to control her anxiety and agitation. She denied having any side effects at this time. She appears angry and agitated during the interview. She reported that she is ready for the surgery and wants to get over with.    Associated Signs/Symptoms: Depression Symptoms:  insomnia, psychomotor agitation, fatigue, difficulty concentrating, anxiety, disturbed sleep, (Hypo) Manic Symptoms:  Distractibility, Flight of Ideas, Community education officer, Impulsivity, Irritable Mood, Labiality of Mood, Anxiety Symptoms:  Excessive Worry, Panic Symptoms, Psychotic Symptoms:  none PTSD  Symptoms: Negative NA  Past Psychiatric History:  No h/o admission No h/o SA    Previous Psychotropic Medications:  Prozac Paxil Effexor- worked best, increased LFT Cymbalta  seroquel xanax  Substance Abuse History in the last 12 months:  Yes.    Social drinker   Consequences of Substance Abuse: Negative NA  Past Medical History:  Past Medical History  Diagnosis Date  . Hypertension   . Anxiety   . Depression   . ADHD (attention deficit hyperactivity disorder)     Past Surgical History  Procedure Laterality Date  . Abdominal hysterectomy  2008  . Gastric bypass    . Tonsillectomy    . Carpal tunnel release      Family Psychiatric History:  She denied any family history of psychiatric illness   Family History:  Family History  Problem Relation Age of Onset  . Cancer Mother     colon  . Epilepsy Mother   . Glaucoma Father     Social History:   Social History   Social History  . Marital Status: Married    Spouse Name: N/A  . Number of Children: N/A  . Years of Education: N/A   Social History Main Topics  . Smoking status: Current Every Day Smoker -- 1.00 packs/day for 14 years    Types: Cigarettes    Start date: 06/11/2000  . Smokeless tobacco: Never Used  . Alcohol Use: No     Comment: rarely  . Drug Use: No  . Sexual Activity:    Partners: Male   Other Topics Concern  . None  Social History Narrative    Additional Social History:  Married x 17 years for first times.  Has children - 2 , 51 and 23  Now married x 9 years - Has good relationship with husband   Allergies:  No Known Allergies  Metabolic Disorder Labs: Lab Results  Component Value Date   HGBA1C 6.3 10/24/2011   No results found for: PROLACTIN Lab Results  Component Value Date   CHOL 144 10/29/2014   TRIG 91 10/29/2014   HDL 49 10/29/2014   CHOLHDL 2.9 10/29/2014   VLDL 18 10/29/2014   LDLCALC 77 10/29/2014   LDLCALC 53 07/08/2013     Current  Medications: Current Outpatient Prescriptions  Medication Sig Dispense Refill  . ALPRAZolam (XANAX) 0.5 MG tablet Take 1 tablet (0.5 mg total) by mouth 2 (two) times daily as needed. 60 tablet 0  . cyclobenzaprine (FLEXERIL) 10 MG tablet TAKE 1 TABLET BY MOUTH EVERY 8 HOURS AS NEEDED FOR MUSCLE SPASMS 90 tablet 1  . DULoxetine (CYMBALTA) 30 MG capsule Take 1 capsule (30 mg total) by mouth daily. 30 capsule 0  . lamoTRIgine (LAMICTAL) 25 MG tablet Take 1 tablet (25 mg total) by mouth daily. 30 tablet 0  . nystatin-triamcinolone (MYCOLOG II) cream Reported on 11/15/2015  0  . omeprazole (PRILOSEC) 40 MG capsule Take 1 capsule (40 mg total) by mouth daily. 30 capsule 2  . QUEtiapine (SEROQUEL) 100 MG tablet Take 1 tablet (100 mg total) by mouth at bedtime. 30 tablet 0   Current Facility-Administered Medications  Medication Dose Route Frequency Provider Last Rate Last Dose  . lidocaine (PF) (XYLOCAINE) 1 % injection 4 mL  4 mL Intradermal Once Steele Sizer, MD        Neurologic: Headache: No Seizure: No Paresthesias:No  Musculoskeletal: Strength & Muscle Tone: within normal limits Gait & Station: normal Patient leans: N/A  Psychiatric Specialty Exam: ROS   Blood pressure 122/78, pulse 102, temperature 98.1 F (36.7 C), temperature source Tympanic, height 5\' 5"  (1.651 m), weight 165 lb 6.4 oz (75.025 kg), SpO2 91 %.Body mass index is 27.52 kg/(m^2).  General Appearance: Well Groomed  Eye Contact:  Fair  Speech:  Pressured  Volume:  Normal  Mood:  Anxious and Irritable  Affect:  Congruent  Thought Process:  Disorganized  Orientation:  Full (Time, Place, and Person)  Thought Content:  Tangential  Suicidal Thoughts:  No  Homicidal Thoughts:  No  Memory:  Immediate;   Fair Recent;   Fair Remote;   Fair  Judgement:  Impaired  Insight:  Fair  Psychomotor Activity:  Increased  Concentration:  Concentration: Fair and Attention Span: Fair  Recall:  [employer]:Fair   Language: Fair  Akathisia:  No  Handed:  Right  AIMS (if indicated):    Assets:  Communication Skills Desire for Improvement Physical Health Social Support Transportation  ADL's:  Intact  Cognition: WNL  Sleep:      Treatment Plan Summary: Medication management   Discussed with patient at length about the medications treatment risks benefits and alternatives Continue Cymbalta 30 mg daily. Started her on Seroquel 100 mg by mouth daily at bedtime and she will also takes Seroquel 25 mg by mouth twice a day when necessary for her more symptoms during the daytime. D/c  lamotrigine She will take Xanax 0.5 mg BID prn when necessary for her anxiety  Follow-up in 4  weeks or earlier depending on her symptoms  She is currently on FMLA and her  primary care physician will release her to go to work when she becomes clinically stable She will also be given a letter for her foot surgeon so she can have a surgery on her foot   More than 50% of the time spent in psychoeducation, counseling and coordination of care.    This note was generated in part or whole with voice recognition software. Voice regonition is usually quite accurate but there are transcription errors that can and very often do occur. I apologize for any typographical errors that were not detected and corrected.    Rainey Pines, MD 6/21/20172:52 PM

## 2015-12-01 NOTE — Telephone Encounter (Signed)
Dr. Gretel Acre faxed a medical clearance letter.  He stated patient was seen on 11/30/2015.  She is cleared to have surgery.  She is adjusting well to her medications for mood symptoms.  I called and informed Caren Griffins at [facility] to put patient's surgery back on for 12/07/2015.   I called patient and informed her surgery was back on for Wednesday.  "I'm supposed to be there at 9:45am."  I don't know the arrival time.  Someone from the surgical center will call you with the arrival time.  "No, they sent me an email and said to be there at that time.  Can Dr. Jacqualyn Posey go ahead and call in my prescription for the antibiotic and pain pills to my pharmacy ahead of time so I won't have to pick it up the day of surgery?"  No, he cannot do that.  Narcotics can't be called into the pharmacy because it's a controlled substance.  You have to have the prescription in hand.  It's our policy to not give out prescriptions in advance prior to surgery.  "You telling me I'm going to have to get out of my car and go inside to have it filled?"  We cannot give prescriptions for controlled substances in advance.  "Well I guess I can give my license to my mom and she can go in and get it.  Thank you so much."

## 2015-12-01 NOTE — Telephone Encounter (Signed)
If she wants her prescriptions before surgery she can come in the day before to get them but knowing she will not get another rx the day of.

## 2015-12-02 ENCOUNTER — Encounter: Payer: Self-pay | Admitting: Family Medicine

## 2015-12-02 ENCOUNTER — Other Ambulatory Visit: Payer: Self-pay | Admitting: Family Medicine

## 2015-12-02 ENCOUNTER — Telehealth: Payer: Self-pay | Admitting: Family Medicine

## 2015-12-02 ENCOUNTER — Ambulatory Visit (INDEPENDENT_AMBULATORY_CARE_PROVIDER_SITE_OTHER): Payer: 59 | Admitting: Family Medicine

## 2015-12-02 VITALS — HR 120 | Temp 99.0°F | Resp 18 | Wt 165.1 lb

## 2015-12-02 DIAGNOSIS — G4700 Insomnia, unspecified: Secondary | ICD-10-CM

## 2015-12-02 DIAGNOSIS — F411 Generalized anxiety disorder: Secondary | ICD-10-CM | POA: Diagnosis not present

## 2015-12-02 DIAGNOSIS — F39 Unspecified mood [affective] disorder: Secondary | ICD-10-CM

## 2015-12-02 DIAGNOSIS — M21619 Bunion of unspecified foot: Secondary | ICD-10-CM

## 2015-12-02 DIAGNOSIS — F319 Bipolar disorder, unspecified: Secondary | ICD-10-CM | POA: Insufficient documentation

## 2015-12-02 NOTE — Telephone Encounter (Signed)
Patient states that her Berenice Primas has been filled by any providers. Asking that you please refill and send to [facility]

## 2015-12-02 NOTE — Telephone Encounter (Signed)
ALPRAZolam (XANAX) 0.5 MG tablet [id number]    Dose: 0.5 mg Route: Oral Frequency: 2 times daily PRN  Dispense Quantity:  60 tablet Refills:  0 Fills Remaining:  0        Sig: Take 1 tablet (0.5 mg total) by mouth 2 (two) times daily as needed.       Written Date:  11/30/15 Expiration Date:  05/28/16    Start Date:  11/30/15 End Date:  --    Ordering Provider:  -- Authorizing Provider:  Rainey Pines, MD Ordering User:  Rainey Pines, MD        Diagnosis Association: Anxiety, generalized (300.02)     It was sent to [facility]

## 2015-12-02 NOTE — Telephone Encounter (Signed)
Patient will call the pharmacy again. They had told her earlier that they did not have anything for xanax

## 2015-12-02 NOTE — Progress Notes (Signed)
Name: Jessica [name]   MRN: [id number]    DOB: [birth year]   Date:12/02/2015       Progress Note  Subjective  Chief Complaint  Chief Complaint  Patient presents with  . Follow-up  . Medication Management    HPI  GAD/Major Depression Disorder: she is feeling worse again, seen by Dr. Maricela Curet and was diagnosed with Episodic mood disorder - tried Lamotrigine but it caused her to feel worse. She was given Seroquel to take multiple times daily but she has not increased the dose yet.  She is under FMLA since her last visit on 10/25/2015   She is taking medications and is able to fall sleep but unable to stay asleep. She has been cleaning her house, but is afraid to go grocery shopping because she is buying things that she does not need. She feels agitates and anxious, she denies hallucinations, no suicidal thoughts or ideation.  She avoids going out with friends, isolating herself   Bunion: both great toes, but will have surgery on right next Wednesday.     Patient Active Problem List   Diagnosis Date Noted  . Episodic mood disorder ([place]) 12/02/2015  . HAV (hallux abducto valgus) 11/22/2015  . Bunion of great toe 11/08/2015  . Attention-deficit hyperactivity disorder, other type 10/28/2015  . Sciatica 04/22/2015  . B12 deficiency 12/01/2014  . History of bariatric surgery 12/01/2014  . Anxiety, generalized 12/01/2014  . Gastro-esophageal reflux disease without esophagitis 12/01/2014  . H/O elevated lipids 12/01/2014  . H/O: HTN (hypertension) 12/01/2014  . H/O: obesity 12/01/2014  . Hodgkin's disease in remission 12/01/2014  . Current tobacco use 12/01/2014  . Insomnia 03/24/2008  . Abnormal serum level of alkaline phosphatase 11/08/2006  . Chronic recurrent major depressive disorder ([place]) 11/08/2006    Past Surgical History  Procedure Laterality Date  . Abdominal hysterectomy  2008  . Gastric bypass    . Tonsillectomy    . Carpal tunnel release      Family History  Problem  Relation Age of Onset  . Cancer Mother     colon  . Epilepsy Mother   . Glaucoma Father     Social History   Social History  . Marital Status: Married    Spouse Name: N/A  . Number of Children: N/A  . Years of Education: N/A   Occupational History  . Not on file.   Social History Main Topics  . Smoking status: Current Every Day Smoker -- 1.00 packs/day for 14 years    Types: Cigarettes    Start date: 06/11/2000  . Smokeless tobacco: Never Used  . Alcohol Use: No     Comment: rarely  . Drug Use: No  . Sexual Activity:    Partners: Male   Other Topics Concern  . Not on file   Social History Narrative     Current outpatient prescriptions:  .  ALPRAZolam (XANAX) 0.5 MG tablet, Take 1 tablet (0.5 mg total) by mouth 2 (two) times daily as needed., Disp: 60 tablet, Rfl: 0 .  cyclobenzaprine (FLEXERIL) 10 MG tablet, TAKE 1 TABLET BY MOUTH EVERY 8 HOURS AS NEEDED FOR MUSCLE SPASMS, Disp: 90 tablet, Rfl: 1 .  DULoxetine (CYMBALTA) 30 MG capsule, Take 1 capsule (30 mg total) by mouth daily., Disp: 30 capsule, Rfl: 0 .  nystatin-triamcinolone (MYCOLOG II) cream, Reported on 11/15/2015, Disp: , Rfl: 0 .  omeprazole (PRILOSEC) 40 MG capsule, Take 1 capsule (40 mg total) by mouth daily., Disp: 30 capsule, Rfl:  2 .  QUEtiapine (SEROQUEL) 100 MG tablet, Take 1 tablet (100 mg total) by mouth at bedtime., Disp: 30 tablet, Rfl: 0 .  QUEtiapine (SEROQUEL) 25 MG tablet, Take 1 tablet (25 mg total) by mouth 2 (two) times daily., Disp: 60 tablet, Rfl: 0  Current facility-administered medications:  .  lidocaine (PF) (XYLOCAINE) 1 % injection 4 mL, 4 mL, Intradermal, Once, Steele Sizer, MD  No Known Allergies   ROS  Constitutional: Negative for fever or weight change.  Respiratory: Negative for cough and shortness of breath.   Cardiovascular: Negative for chest pain or palpitations.  Gastrointestinal: Negative for abdominal pain, no bowel changes.  Musculoskeletal: Negative for gait  problem or joint swelling.  Skin: Negative for rash.  Neurological: Negative for dizziness or headache.  No other specific complaints in a complete review of systems (except as listed in HPI above).   Objective  Filed Vitals:   12/02/15 0947  Pulse: 120  Temp: 99 F (37.2 C)  TempSrc: Oral  Resp: 18  Weight: 165 lb 1.6 oz (74.889 kg)  SpO2: 98%    Body mass index is 27.47 kg/(m^2).  Physical Exam  Constitutional: Patient appears well-developed and well-nourished.  No distress.  HEENT: head atraumatic, normocephalic, pupils equal and reactive to light,  neck supple, throat within normal limits Cardiovascular: Normal rate, regular rhythm and normal heart sounds.  No murmur heard. No BLE edema. Pulmonary/Chest: Effort normal and breath sounds normal. No respiratory distress. Abdominal: Soft.  There is no tenderness. Psychiatric: Patient has an agitated mood, fidgety, pressured speech.    PHQ2/9: Depression screen [facility] 2/9 12/02/2015 11/08/2015 10/28/2015 06/17/2015 04/22/2015  Decreased Interest 0 1 1 0 0  Down, Depressed, Hopeless 1 3 3  0 0  PHQ - 2 Score 1 4 4  0 0  Altered sleeping 0 2 3 - -  Tired, decreased energy 0 1 3 - -  Change in appetite 0 0 2 - -  Feeling bad or failure about yourself  1 1 2  - -  Trouble concentrating - 2 3 - -  Moving slowly or fidgety/restless 1 2 3  - -  Suicidal thoughts 0 0 0 - -  PHQ-9 Score 3 12 20  - -  Difficult doing work/chores - Extremely dIfficult Extremely dIfficult - -     Fall Risk: Fall Risk  12/02/2015 11/08/2015 10/28/2015 06/17/2015 04/22/2015  Falls in the past year? No No No No No      Functional Status Survey: Is the patient deaf or have difficulty hearing?: No Does the patient have difficulty seeing, even when wearing glasses/contacts?: No Does the patient have difficulty concentrating, remembering, or making decisions?: Yes Does the patient have difficulty walking or climbing stairs?: No Does the patient have difficulty  dressing or bathing?: No Does the patient have difficulty doing errands alone such as visiting a doctor's office or shopping?: No   Assessment & Plan  1. Episodic mood disorder ([place])  Continue follow up with Dr.Farhem - and medication as prescribed, filled out FMLA, extending time off until September 20th, 2017  2. Bunion of great toe  She will have surgery next week  3. Insomnia  Sleeping well   4. Anxiety, generalized  Still very anxious, not able to return to work yet, needs to be able to return to baseline function before going back

## 2015-12-02 NOTE — Telephone Encounter (Signed)
Patient requesting refill. 

## 2015-12-06 ENCOUNTER — Telehealth: Payer: Self-pay | Admitting: *Deleted

## 2015-12-06 ENCOUNTER — Telehealth: Payer: Self-pay

## 2015-12-06 DIAGNOSIS — M2010 Hallux valgus (acquired), unspecified foot: Secondary | ICD-10-CM

## 2015-12-06 NOTE — Telephone Encounter (Signed)
"  I'm calling in regards to Jessica [name].  Dr. Ancil Boozer wants to know if you have [name] forms for [place] to fill out.  If you do, you can fax it to [phone].  We are getting your forms asking if she has [name] clearance.  Dr. Ancil Boozer normally has forms to fill out."  I'm returning your call.  We don't necessarily have a form that needs to be completed.  If you'd like, we can send a form that we use for [facility] [name] [facility].  "That would be great.  She'd rather have something to fill out."  I faxed the form to Dr. Ancil Boozer' office.

## 2015-12-06 NOTE — Telephone Encounter (Signed)
Pt was given the prescription and it was printed.

## 2015-12-06 NOTE — Telephone Encounter (Signed)
pt needs her xanax sent to [facility]. pt states that you did not send to pharmacy and you did not give it too her.  pt states that she is having surgery tomorrow and she was going to pick up today.

## 2015-12-06 NOTE — Telephone Encounter (Signed)
Dr. Ancil Boozer sent a fax stating, "Patient is at low risk for surgery.  She has been cleared by Psychiatrist."

## 2015-12-07 DIAGNOSIS — M2011 Hallux valgus (acquired), right foot: Secondary | ICD-10-CM | POA: Diagnosis not present

## 2015-12-07 DIAGNOSIS — M79671 Pain in right foot: Secondary | ICD-10-CM | POA: Diagnosis not present

## 2015-12-07 DIAGNOSIS — I10 Essential (primary) hypertension: Secondary | ICD-10-CM | POA: Diagnosis not present

## 2015-12-07 DIAGNOSIS — M25571 Pain in right ankle and joints of right foot: Secondary | ICD-10-CM | POA: Diagnosis not present

## 2015-12-08 ENCOUNTER — Telehealth: Payer: Self-pay | Admitting: *Deleted

## 2015-12-08 NOTE — Telephone Encounter (Addendum)
Post op courtesy call-Left message for pt instructing he not to weight bear or dangle foot more than 15 mins/hour, leave the boot on at all times, leave the dressing in place clean and dry it will be changed at the 1stPOV, call early on Friday for pain medication because the [place] office closes at 200pm and call with concerns. 12/27/2015-Pt states Dr. Jacqualyn Posey switched her from [facility] to Vicodin, and the Vicodin is giving her Miagraines, and she thinks she needs to be switched back to the Percocet, she is still having pain in the joint.  Pt states the skin on the bottom of her foot is sloughing off in sheets to the point of bleeding, what can she do?12/27/2015-Informed pt of Dr. Leigh Aurora orders and that she could pick up the Percocet in the [place] office.

## 2015-12-09 ENCOUNTER — Telehealth: Payer: Self-pay

## 2015-12-09 ENCOUNTER — Telehealth: Payer: Self-pay | Admitting: Family Medicine

## 2015-12-09 DIAGNOSIS — F411 Generalized anxiety disorder: Secondary | ICD-10-CM

## 2015-12-09 MED ORDER — ALPRAZOLAM 0.5 MG PO TABS: 0.5000 mg | ORAL_TABLET | Freq: Two times a day (BID) | ORAL | Status: DC | PRN

## 2015-12-09 NOTE — Telephone Encounter (Signed)
faxed and confirmed rx for xanax .5mg  id [id number] order # [id number]

## 2015-12-09 NOTE — Telephone Encounter (Signed)
Refilled xanax

## 2015-12-09 NOTE — Telephone Encounter (Signed)
Can you verify what form it is and see if I have signed it?

## 2015-12-09 NOTE — Telephone Encounter (Signed)
pt called left message that she looked everywhere for her rx and she does not have it.  can you please resend rx to pharmacy for her xananx.

## 2015-12-09 NOTE — Telephone Encounter (Signed)
Requesting return call she is checking status on a forms from [employer] that is needing Dr Ancil Boozer signature (its insurance on her loan).

## 2015-12-14 ENCOUNTER — Ambulatory Visit: Payer: Self-pay | Admitting: Family Medicine

## 2015-12-14 ENCOUNTER — Other Ambulatory Visit: Payer: Self-pay | Admitting: Psychiatry

## 2015-12-16 ENCOUNTER — Ambulatory Visit (INDEPENDENT_AMBULATORY_CARE_PROVIDER_SITE_OTHER): Payer: 59 | Admitting: Sports Medicine

## 2015-12-16 ENCOUNTER — Encounter: Payer: Self-pay | Admitting: Sports Medicine

## 2015-12-16 ENCOUNTER — Ambulatory Visit (INDEPENDENT_AMBULATORY_CARE_PROVIDER_SITE_OTHER): Payer: 59

## 2015-12-16 DIAGNOSIS — Z9889 Other specified postprocedural states: Secondary | ICD-10-CM

## 2015-12-16 DIAGNOSIS — M79671 Pain in right foot: Secondary | ICD-10-CM | POA: Diagnosis not present

## 2015-12-16 MED ORDER — OXYCODONE-ACETAMINOPHEN 5-325 MG PO TABS: 1.0000 | ORAL_TABLET | Freq: Four times a day (QID) | ORAL | Status: DC | PRN

## 2015-12-17 NOTE — Progress Notes (Signed)
Patient ID: Jessica [name], female   DOB: 06/17/1965, 50 y.o.   MRN: [id number] Subjective: Jessica [name] is a 50 y.o. female patient seen today in office for POV #1 (DOS 12-07-15), S/P Right [place] Bunionectomy. Patient denies current pain at surgical site however there is significant numbness and occassional pain at night that is relieved with PO pain medication, denies calf pain, denies headache, chest pain, shortness of breath, nausea, vomiting, fever, or chills. No other issues noted.   Patient Active Problem List   Diagnosis Date Noted  . Episodic mood disorder ([facility]) 12/02/2015  . HAV (hallux abducto valgus) 11/22/2015  . Bunion of great toe 11/08/2015  . Attention-deficit hyperactivity disorder, other type 10/28/2015  . Sciatica 04/22/2015  . B12 deficiency 12/01/2014  . History of bariatric surgery 12/01/2014  . Anxiety, generalized 12/01/2014  . Gastro-esophageal reflux disease without esophagitis 12/01/2014  . H/O elevated lipids 12/01/2014  . H/O: HTN (hypertension) 12/01/2014  . H/O: obesity 12/01/2014  . Hodgkin's disease in remission 12/01/2014  . Current tobacco use 12/01/2014  . Insomnia 03/24/2008  . Abnormal serum level of alkaline phosphatase 11/08/2006  . Chronic recurrent major depressive disorder ([facility]) 11/08/2006    Current Outpatient Prescriptions on File Prior to Visit  Medication Sig Dispense Refill  . ALPRAZolam (XANAX) 0.5 MG tablet Take 1 tablet (0.5 mg total) by mouth 2 (two) times daily as needed. 60 tablet 0  . cyclobenzaprine (FLEXERIL) 10 MG tablet TAKE 1 TABLET BY MOUTH EVERY 8 HOURS AS NEEDED FOR MUSCLE SPASMS 90 tablet 1  . DULoxetine (CYMBALTA) 30 MG capsule Take 1 capsule (30 mg total) by mouth daily. 30 capsule 0  . nystatin-triamcinolone (MYCOLOG II) cream Reported on 11/15/2015  0  . omeprazole (PRILOSEC) 40 MG capsule Take 1 capsule (40 mg total) by mouth daily. 30 capsule 2  . QUEtiapine (SEROQUEL) 100 MG tablet Take 1 tablet (100 mg total) by  mouth at bedtime. 30 tablet 0  . QUEtiapine (SEROQUEL) 25 MG tablet Take 1 tablet (25 mg total) by mouth 2 (two) times daily. 60 tablet 0   Current Facility-Administered Medications on File Prior to Visit  Medication Dose Route Frequency Provider Last Rate Last Dose  . lidocaine (PF) (XYLOCAINE) 1 % injection 4 mL  4 mL Intradermal Once Krichna Sowles, MD        No Known Allergies  Objective: There were no vitals filed for this visit.  General: No acute distress, AAOx3  Right foot: Sutures intact with no gapping or dehiscence at surgical site, mild swelling to right forefoot, no erythema, no warmth, no drainage, no signs of infection noted, Capillary fill time <3 seconds in all digits, gross sensation present via light touch to right foot. Mild hypersensisitivity to initial touch but No pain or crepitation with range of motion right foot, there is mild guarding.  No pain with calf compression.   Post Op Xray, Right foot: 1st met in good alignment and position. Osteotomy site healing. Hardware intact. Soft tissue swelling within normal limits for post op status.   Assessment and Plan:  Problem List Items Addressed This Visit    None    Visit Diagnoses    Right foot pain    -  Primary    Relevant Medications    oxyCODONE-acetaminophen (ROXICET) 5-325 MG tablet    Other Relevant Orders    DG Foot Complete Right    Status post right foot surgery        12-07-15       -  Patient seen and evaluated -Applied dry sterile dressing to surgical site right foot secured with ACE wrap and stockinet  -Advised patient to make sure to keep dressings clean, dry, and intact to right surgical site, adjusting the ACE only as needed  -Advised patient to continue with CAM boot to right foot -Advised patient to limit activity to necessity  -Advised patient to ice and elevate as necessary  -Continue with Pain and PRN medications as needed; Refilled Percocet 5/325 for patient to take as needed for  pain -Answered all patient's questions and re-assured her that pain, numbness, swelling is typical for her current post op status -Patient to follow up with Dr. Wagoner in 1 week for possible suture removal and continued post op care. In the meantime, patient to call office if any issues or problems arise.    , DPM  

## 2015-12-22 ENCOUNTER — Telehealth: Payer: Self-pay | Admitting: Podiatry

## 2015-12-22 ENCOUNTER — Ambulatory Visit (INDEPENDENT_AMBULATORY_CARE_PROVIDER_SITE_OTHER): Payer: 59 | Admitting: Podiatry

## 2015-12-22 ENCOUNTER — Encounter: Payer: Self-pay | Admitting: Podiatry

## 2015-12-22 DIAGNOSIS — M2010 Hallux valgus (acquired), unspecified foot: Secondary | ICD-10-CM

## 2015-12-22 DIAGNOSIS — Z9889 Other specified postprocedural states: Secondary | ICD-10-CM

## 2015-12-22 MED ORDER — HYDROCODONE-ACETAMINOPHEN 5-325 MG PO TABS: 1.0000 | ORAL_TABLET | Freq: Four times a day (QID) | ORAL | Status: DC | PRN

## 2015-12-22 NOTE — Telephone Encounter (Signed)
Her short term disability is until July 27th 2017, but dr. Jacqualyn Posey said it was ok to push it out 4 more weeks. Can you send them whenever they need to extend her short term please ma'am

## 2015-12-22 NOTE — Telephone Encounter (Signed)
Extension faxed estimated RTW on 01/26/16, letter sent with restrictions and RTW date to [employer] company

## 2015-12-25 NOTE — Progress Notes (Signed)
Patient ID: Jessica [name], female   DOB: 1965/10/19, 50 y.o.   MRN: [id number]  Subjective: Jessica [name] is a 50 y.o. is seen today in office s/p right [place] bunionectomy preformed on 12/07/15. She states that her pain is controlled with pain medicine she is decreased which she's been taking only taken at night usually. She still has some numbness of the toe but this is improving as well. She's remain in the cam boot. Denies any systemic complaints such as fevers, chills, nausea, vomiting. No calf pain, chest pain, shortness of breath.   Objective: General: No acute distress, AAOx3  DP/PT pulses palpable 2/4, CRT < 3 sec to all digits.  Protective sensation intact. Motor function intact.  Right foot: Incision is well coapted without any evidence of dehiscence and suture ends intact. There is no surrounding erythema, ascending cellulitis, fluctuance, crepitus, malodor, drainage/purulence. There is mild edema around the surgical site. There is mild pain along the surgical site.  No other areas of tenderness to bilateral lower extremities.  No other open lesions or pre-ulcerative lesions.  No pain with calf compression, swelling, warmth, erythema.   Assessment and Plan:  Status post right [facility], doing well with no complications   -Treatment options discussed including all alternatives, risks, and complications -Suture ends were cut today. Continue antibiotic ointment dressing changes daily. -Splint was dispensed to help hold the toenail rectus position during healing. -Continue cam boot at all times. No driving. -Ice/elevation -Pain medication as needed. -Monitor for any clinical signs or symptoms of infection and DVT/PE and directed to call the office immediately should any occur or go to the ER. -Follow-up in 2 weeks or sooner if any problems arise. In the meantime, encouraged to call the office with any questions, concerns, change in symptoms.   Celesta Gentile, DPM

## 2015-12-27 ENCOUNTER — Other Ambulatory Visit: Payer: Self-pay | Admitting: Podiatry

## 2015-12-27 DIAGNOSIS — M79671 Pain in right foot: Secondary | ICD-10-CM

## 2015-12-27 MED ORDER — OXYCODONE-ACETAMINOPHEN 5-325 MG PO TABS: 1.0000 | ORAL_TABLET | Freq: Four times a day (QID) | ORAL | Status: DC | PRN

## 2015-12-27 NOTE — Telephone Encounter (Signed)
OK to refill percocet. If the skin is moist can can use a little betadine to help dry as well. Also, she can wear the cotton sock.

## 2015-12-27 NOTE — Telephone Encounter (Signed)
done

## 2015-12-30 ENCOUNTER — Encounter: Payer: Self-pay | Admitting: Psychiatry

## 2015-12-30 ENCOUNTER — Ambulatory Visit (INDEPENDENT_AMBULATORY_CARE_PROVIDER_SITE_OTHER): Payer: 59 | Admitting: Psychiatry

## 2015-12-30 VITALS — BP 137/84 | HR 86 | Temp 98.8°F

## 2015-12-30 DIAGNOSIS — G4700 Insomnia, unspecified: Secondary | ICD-10-CM | POA: Diagnosis not present

## 2015-12-30 DIAGNOSIS — F39 Unspecified mood [affective] disorder: Secondary | ICD-10-CM | POA: Diagnosis not present

## 2015-12-30 DIAGNOSIS — F411 Generalized anxiety disorder: Secondary | ICD-10-CM | POA: Diagnosis not present

## 2015-12-30 MED ORDER — DULOXETINE HCL 30 MG PO CPEP: 30.0000 mg | ORAL_CAPSULE | Freq: Every day | ORAL | Status: DC

## 2015-12-30 MED ORDER — ALPRAZOLAM 0.5 MG PO TABS: 0.5000 mg | ORAL_TABLET | Freq: Two times a day (BID) | ORAL | Status: DC | PRN

## 2015-12-30 MED ORDER — QUETIAPINE FUMARATE 25 MG PO TABS: 25.0000 mg | ORAL_TABLET | Freq: Two times a day (BID) | ORAL | Status: DC

## 2015-12-30 MED ORDER — QUETIAPINE FUMARATE 100 MG PO TABS: 100.0000 mg | ORAL_TABLET | Freq: Every day | ORAL | Status: DC

## 2015-12-30 NOTE — Progress Notes (Signed)
Psychiatric MD Progress Note   Patient Identification: Jessica [name] MRN:  [id number] Date of Evaluation:  12/30/2015 Referral Source: PCP Chief Complaint:   Chief Complaint    Follow-up; Medication Refill     Visit Diagnosis:    ICD-9-CM ICD-10-CM   1. Episodic mood disorder ([name]) 296.90 F39   2. Anxiety, generalized 300.02 F41.1     History of Present Illness:    Patient is a 50 year old married female with history of mood symptoms presented for Follow-up.She was wearing a boot and reported that she has started improving on her medications. She is taking only half a pill of Percocet at this time as she reported that she has been stable on the pain medications. She reported that she did well on the Seroquel 25 mg twice a day and her more symptoms are improving. She also takes Seroquel 100 mg at bedtime. We discussed about her medications at length as she reported that she ran out of the Seroquel 25 mg. She stated that she does not know how she is out of her medications. Later on she reported that her 57 year old daughter has just moved into her house and she has been going to our HA. Patient is concerned that her daughter might be taking her medications as she is following with Dr. Jacqualine Code and he is not prescribing her any medications. Patient appeared anxious and apprehensive about her daughter's behavior. She is going to discuss with her about her medications. Patient currently denied having any suicidal ideations or plans. Her mood appears stable. She denied having any perceptual disturbances. She wants to continue the medications at this time.      Associated Signs/Symptoms: Depression Symptoms:  insomnia, fatigue, anxiety, (Hypo) Manic Symptoms:  Distractibility, Flight of Ideas, Community education officer, Impulsivity, Irritable Mood, Labiality of Mood, Anxiety Symptoms:  Excessive Worry, Panic Symptoms, Psychotic Symptoms:  none PTSD Symptoms: Negative NA  Past  Psychiatric History:  No h/o admission No h/o SA    Previous Psychotropic Medications:  Prozac Paxil Effexor- worked best, increased LFT Cymbalta  seroquel xanax  Substance Abuse History in the last 12 months:  Yes.    Social drinker   Consequences of Substance Abuse: Negative NA  Past Medical History:  Past Medical History  Diagnosis Date  . Hypertension   . Anxiety   . Depression   . ADHD (attention deficit hyperactivity disorder)     Past Surgical History  Procedure Laterality Date  . Abdominal hysterectomy  2008  . Gastric bypass    . Tonsillectomy    . Carpal tunnel release      Family Psychiatric History:  She denied any family history of psychiatric illness   Family History:  Family History  Problem Relation Age of Onset  . Cancer Mother     colon  . Epilepsy Mother   . Glaucoma Father     Social History:   Social History   Social History  . Marital Status: Married    Spouse Name: N/A  . Number of Children: N/A  . Years of Education: N/A   Social History Main Topics  . Smoking status: Current Every Day Smoker -- 1.00 packs/day for 14 years    Types: Cigarettes    Start date: 06/11/2000  . Smokeless tobacco: Never Used  . Alcohol Use: No     Comment: rarely  . Drug Use: No  . Sexual Activity:    Partners: Male   Other Topics Concern  . None   Social History  Narrative    Additional Social History:  Married x 17 years for first times.  Has children - 2 , 65 and 23  Now married x 9 years - Has good relationship with husband   Allergies:   Allergies  Allergen Reactions  . Vicodin [Hydrocodone-Acetaminophen] Other (See Comments)    Has really bad headaches/ migraine      Metabolic Disorder Labs: Lab Results  Component Value Date   HGBA1C 6.3 10/24/2011   No results found for: PROLACTIN Lab Results  Component Value Date   CHOL 144 10/29/2014   TRIG 91 10/29/2014   HDL 49 10/29/2014   CHOLHDL 2.9 10/29/2014   VLDL 18  10/29/2014   LDLCALC 77 10/29/2014   LDLCALC 53 07/08/2013     Current Medications: Current Outpatient Prescriptions  Medication Sig Dispense Refill  . ALPRAZolam (XANAX) 0.5 MG tablet Take 1 tablet (0.5 mg total) by mouth 2 (two) times daily as needed. 60 tablet 0  . cyclobenzaprine (FLEXERIL) 10 MG tablet TAKE 1 TABLET BY MOUTH EVERY 8 HOURS AS NEEDED FOR MUSCLE SPASMS 90 tablet 1  . DULoxetine (CYMBALTA) 30 MG capsule Take 1 capsule (30 mg total) by mouth daily. 30 capsule 0  . HYDROcodone-acetaminophen (NORCO/VICODIN) 5-325 MG tablet Take 1 tablet by mouth every 6 (six) hours as needed. 30 tablet 0  . nystatin-triamcinolone (MYCOLOG II) cream Reported on 11/15/2015  0  . omeprazole (PRILOSEC) 40 MG capsule Take 1 capsule (40 mg total) by mouth daily. 30 capsule 2  . oxyCODONE-acetaminophen (ROXICET) 5-325 MG tablet Take 1 tablet by mouth every 6 (six) hours as needed for severe pain. 30 tablet 0  . QUEtiapine (SEROQUEL) 100 MG tablet Take 1 tablet (100 mg total) by mouth at bedtime. 30 tablet 0  . QUEtiapine (SEROQUEL) 25 MG tablet Take 1 tablet (25 mg total) by mouth 2 (two) times daily. 60 tablet 0   Current Facility-Administered Medications  Medication Dose Route Frequency Provider Last Rate Last Dose  . lidocaine (PF) (XYLOCAINE) 1 % injection 4 mL  4 mL Intradermal Once Steele Sizer, MD        Neurologic: Headache: No Seizure: No Paresthesias:No  Musculoskeletal: Strength & Muscle Tone: within normal limits Gait & Station: normal Patient leans: N/A  Psychiatric Specialty Exam: ROS   Blood pressure 137/84, pulse 86, temperature 98.8 F (37.1 C), temperature source Oral.There is no weight on file to calculate BMI.  General Appearance: Well Groomed  Eye Contact:  Fair  Speech:  Pressured  Volume:  Normal  Mood:  Anxious and Irritable  Affect:  Congruent  Thought Process:  Disorganized  Orientation:  Full (Time, Place, and Person)  Thought Content:  Tangential   Suicidal Thoughts:  No  Homicidal Thoughts:  No  Memory:  Immediate;   Fair Recent;   Fair Remote;   Fair  Judgement:  Impaired  Insight:  Fair  Psychomotor Activity:  Increased  Concentration:  Concentration: Fair and Attention Span: Fair  Recall:  [employer] of Knowledge:Fair  Language: Fair  Akathisia:  No  Handed:  Right  AIMS (if indicated):    Assets:  Communication Skills Desire for Improvement Physical Health Social Support Transportation  ADL's:  Intact  Cognition: WNL  Sleep:      Treatment Plan Summary: Medication management   Discussed with patient at length about the medications treatment risks benefits and alternatives Continue Cymbalta 30 mg daily. Started her on Seroquel 100 mg by mouth daily at bedtime and she will  also takes Seroquel 25 mg by mouth twice a day when necessary for her more symptoms during the daytime. She will take Xanax 0.5 mg BID prn when necessary for her anxiety  Follow-up in 4  weeks or earlier depending on her symptoms  She is currently on FMLA and her primary care physician will release her to go to work when she becomes clinically stable   More than 50% of the time spent in psychoeducation, counseling and coordination of care.    This note was generated in part or whole with voice recognition software. Voice regonition is usually quite accurate but there are transcription errors that can and very often do occur. I apologize for any typographical errors that were not detected and corrected.    Rainey Pines, MD 7/21/201710:10 AM

## 2016-01-02 ENCOUNTER — Ambulatory Visit: Payer: Self-pay | Admitting: Family Medicine

## 2016-01-05 ENCOUNTER — Ambulatory Visit (INDEPENDENT_AMBULATORY_CARE_PROVIDER_SITE_OTHER): Payer: 59 | Admitting: Podiatry

## 2016-01-05 ENCOUNTER — Encounter: Payer: Self-pay | Admitting: Podiatry

## 2016-01-05 ENCOUNTER — Ambulatory Visit (INDEPENDENT_AMBULATORY_CARE_PROVIDER_SITE_OTHER): Payer: 59

## 2016-01-05 DIAGNOSIS — M2010 Hallux valgus (acquired), unspecified foot: Secondary | ICD-10-CM | POA: Diagnosis not present

## 2016-01-05 DIAGNOSIS — Z9889 Other specified postprocedural states: Secondary | ICD-10-CM | POA: Diagnosis not present

## 2016-01-05 NOTE — Progress Notes (Signed)
Patient ID: Jessica [name], female   DOB: 06-28-1965, 50 y.o.   MRN: [id number]  Subjective: Jessica [name] is a 50 y.o. is seen today in office s/p right [place] bunionectomy preformed on 49/28/17. She states that his she is moving her feet for 2 hours or more she starts to get swelling to her foot however states the swelling is doing good and she thinks her foot looks very good and she is happy with the results today. She does that she has limited motion to the big toe joint although this has improved. She still gets some numbness in the big toe. She's remain in the cam boot the majority of the time but she does walk without it. Denies any systemic complaints such as fevers, chills, nausea, vomiting. No calf pain, chest pain, shortness of breath.   Objective: General: No acute distress, AAOx3  DP/PT pulses palpable 2/4, CRT < 3 sec to all digits.  Protective sensation intact. Motor function intact.  Right foot: Incision is well coapted without any evidence of dehiscence and a scar has formed. There is no surrounding erythema, ascending cellulitis, fluctuance, crepitus, malodor, drainage/purulence. There is minimal edema around the surgical site. There is mild pain along the surgical site. The toe sits in a rectus position. Mild decrease in first MPJ range of motion. No other areas of tenderness to bilateral lower extremities.  No other open lesions or pre-ulcerative lesions.  No pain with calf compression, swelling, warmth, erythema.   Assessment and Plan:  Status post right Austin bunionectomy, doing well with no complications   -Treatment options discussed including all alternatives, risks, and complications -Recommended cocoa butter scar cream over the incision. -Range of motion exercises for the first MTPJ discussed. -Ice and elevation -Compression anklet -Dispensed surgical shoe -Monitor for any clinical signs or symptoms of infection and DVT/PE and directed to call the office  immediately should any occur or go to the ER. -Follow-up in 2 weeks or sooner if any problems arise. In the meantime, encouraged to call the office with any questions, concerns, change in symptoms.   Celesta Gentile, DPM

## 2016-01-09 ENCOUNTER — Telehealth: Payer: Self-pay | Admitting: Podiatry

## 2016-01-09 NOTE — Telephone Encounter (Signed)
Pt request refill of oxycodone. Pt states Dr. Jacqualyn Posey has her doing exercises and she can't feel her foot and the exercises just send tingles up her foot.  I told pt I would discuss with Dr. Jacqualyn Posey and call her to pick up an rx in the [facility] office tomorrow.

## 2016-01-09 NOTE — Telephone Encounter (Signed)
There should be a RX for her already for her to come get

## 2016-01-11 ENCOUNTER — Other Ambulatory Visit: Payer: Self-pay | Admitting: *Deleted

## 2016-01-11 DIAGNOSIS — M79671 Pain in right foot: Secondary | ICD-10-CM

## 2016-01-11 MED ORDER — OXYCODONE-ACETAMINOPHEN 5-325 MG PO TABS: 1.0000 | ORAL_TABLET | Freq: Four times a day (QID) | ORAL | 0 refills | Status: DC | PRN

## 2016-01-11 NOTE — Telephone Encounter (Signed)
Patient presents to office to pick up pain medication.

## 2016-01-23 ENCOUNTER — Ambulatory Visit: Payer: Self-pay | Admitting: Physician Assistant

## 2016-01-23 ENCOUNTER — Encounter: Payer: Self-pay | Admitting: Physician Assistant

## 2016-01-23 VITALS — BP 110/80 | HR 84 | Temp 99.3°F

## 2016-01-23 DIAGNOSIS — N390 Urinary tract infection, site not specified: Secondary | ICD-10-CM

## 2016-01-23 DIAGNOSIS — R319 Hematuria, unspecified: Secondary | ICD-10-CM

## 2016-01-23 DIAGNOSIS — R300 Dysuria: Secondary | ICD-10-CM

## 2016-01-23 LAB — POCT URINALYSIS DIPSTICK
Bilirubin, UA: NEGATIVE
Glucose, UA: NEGATIVE
Ketones, UA: NEGATIVE
NITRITE UA: NEGATIVE
PH UA: 5.5
Protein, UA: NEGATIVE
Spec Grav, UA: <=1.005
UROBILINOGEN UA: 0.2

## 2016-01-23 MED ORDER — PHENAZOPYRIDINE HCL 200 MG PO TABS: 200.0000 mg | ORAL_TABLET | Freq: Three times a day (TID) | ORAL | 0 refills | Status: DC | PRN

## 2016-01-23 MED ORDER — CIPROFLOXACIN HCL 250 MG PO TABS: 250.0000 mg | ORAL_TABLET | Freq: Two times a day (BID) | ORAL | 0 refills | Status: DC

## 2016-01-23 NOTE — Progress Notes (Signed)
S:  C/o uti sx for 2 days, burning, urgency, frequency, denies vaginal discharge, abdominal pain or flank pain:  Remainder ros neg  O:  Vitals wnl, nad, no cva tenderness, back nontender, lungs c t a,cv rrr, abd soft nontender, bs normal, n/v intact; ua 1+ leuks, 3+ blood  A: uti  P: cipro 250mg  bid x 7d, increase water intake, add cranberry juice, return if not improving in 2 -3 days, return earlier if worsening, discussed pyelonephritis sx

## 2016-01-24 ENCOUNTER — Encounter: Payer: Self-pay | Admitting: Podiatry

## 2016-01-24 ENCOUNTER — Ambulatory Visit (INDEPENDENT_AMBULATORY_CARE_PROVIDER_SITE_OTHER): Payer: 59

## 2016-01-24 ENCOUNTER — Ambulatory Visit (INDEPENDENT_AMBULATORY_CARE_PROVIDER_SITE_OTHER): Payer: 59 | Admitting: Podiatry

## 2016-01-24 DIAGNOSIS — M2010 Hallux valgus (acquired), unspecified foot: Secondary | ICD-10-CM

## 2016-01-24 DIAGNOSIS — Z09 Encounter for follow-up examination after completed treatment for conditions other than malignant neoplasm: Secondary | ICD-10-CM

## 2016-01-24 NOTE — Progress Notes (Signed)
Patient ID: MYSTIC RODD, female   DOB: [birth year], 50 y.o.   MRN: [id number]  Subjective: LAVANDA TULLOCH is a 50 y.o. is seen today in office s/p right Austin bunionectomy preformed on 12/07/15. She's overall she is doing well and she is happy the results of the way the bunion looks postoperatively. She states that her foot does swell as she is on her feet a lot she has discomfort mostly at night. She has continue the surgical shoe. She did go to the beach since last appointment. Denies any recent injury or trauma. She states the tingling and numbness is improving compared to last appointment as well. Denies any systemic complaints such as fevers, chills, nausea, vomiting. No calf pain, chest pain, shortness of breath.   Objective: General: No acute distress, AAOx3  DP/PT pulses palpable 2/4, CRT < 3 sec to all digits.  Protective sensation intact. Motor function intact.  Right foot: Incision is well coapted without any evidence of dehiscence and a scar has formed. There is no surrounding erythema, ascending cellulitis, fluctuance, crepitus, malodor, drainage/purulence. There is decreased edema around the surgical site. There is mild pain along the surgical site. The toe sits in a rectus position. Mild decrease in first MPJ range of motion and mild discomfort with MPJ range of motion due to stiffness. No other areas of tenderness to bilateral lower extremities.  No other open lesions or pre-ulcerative lesions.  No pain with calf compression, swelling, warmth, erythema.   Assessment and Plan:  Status post right [facility], doing well with no complications   -Treatment options discussed including all alternatives, risks, and complications -X-rays obtained and reviewed today. Increased consolidation. Hardware intact. -At this time she can slowly start to transition to regular shoe as tolerated. Discussed with her continue range of motion exercises for the first MTPJ at home. Discussed  gradual transition to regular shoe. We'll start physical therapy as well. Prescription provided today. Continue ice and elevation as also compression anklet. -Follow-up in 3 weeks or sooner if any issues are to arise. Meantime encouraged to call any questions, concerns or any change in symptoms. -At this time she is going to long-term disability from short-term disability tomorrow. At this time she states that she cannot return to work as she cannot sit and she must be full duty before going back to work. She was out on short-term disability prior to surgery for other issues.  Celesta Gentile, DPM

## 2016-01-27 ENCOUNTER — Ambulatory Visit (INDEPENDENT_AMBULATORY_CARE_PROVIDER_SITE_OTHER): Payer: 59 | Admitting: Family Medicine

## 2016-01-27 ENCOUNTER — Encounter: Payer: Self-pay | Admitting: Family Medicine

## 2016-01-27 VITALS — BP 126/82 | HR 111 | Temp 98.9°F | Resp 18 | Ht 65.0 in | Wt 165.0 lb

## 2016-01-27 DIAGNOSIS — F411 Generalized anxiety disorder: Secondary | ICD-10-CM

## 2016-01-27 DIAGNOSIS — F39 Unspecified mood [affective] disorder: Secondary | ICD-10-CM | POA: Diagnosis not present

## 2016-01-27 DIAGNOSIS — M21619 Bunion of unspecified foot: Secondary | ICD-10-CM | POA: Diagnosis not present

## 2016-01-27 NOTE — Progress Notes (Signed)
Name: Jessica [name]   MRN: [id number]    DOB: February 13, 1966   Date:01/27/2016       Progress Note  Subjective  Chief Complaint  Chief Complaint  Patient presents with  . FMLA    Patient is seeing psychiatrist Dr. Rainey Pines once a monthly and was given anxiety medications and mood stabilizers. Doing well with medication and therapy.   Jessica [name]    Patient had foot surgery on December 06, 2016 by Dr. Celesta Gentile at [facility].     HPI  She has been out of work since May 19th, 2017 initially because of anxiety and was referred to Psychiatrist. She is seeing Dr. Maricela Curet and is doing well now, able to return to work, no longer having mood swings, inability to sleep has resolved, and able to focus at this time.  She is however out of work until September 20th, 2017 because she had right bunion surgery on June 28th, 2017 by Dr. Marice Potter. She is going to have rehab next week, but unable to work until cleared by podiatrist. Her symptoms bunion pain started in June 2017.   Patient Active Problem List   Diagnosis Date Noted  . Episodic mood disorder ([place]) 12/02/2015  . HAV (hallux abducto valgus) 11/22/2015  . Bunion of great toe 11/08/2015  . Attention-deficit hyperactivity disorder, other type 10/28/2015  . Sciatica 04/22/2015  . B12 deficiency 12/01/2014  . History of bariatric surgery 12/01/2014  . Anxiety, generalized 12/01/2014  . Gastro-esophageal reflux disease without esophagitis 12/01/2014  . H/O elevated lipids 12/01/2014  . H/O: HTN (hypertension) 12/01/2014  . H/O: obesity 12/01/2014  . Hodgkin's disease in remission 12/01/2014  . Current tobacco use 12/01/2014  . Insomnia 03/24/2008  . Abnormal serum level of alkaline phosphatase 11/08/2006  . Chronic recurrent major depressive disorder ([place]) 11/08/2006    Past Surgical History:  Procedure Laterality Date  . ABDOMINAL HYSTERECTOMY  2008  . CARPAL TUNNEL RELEASE    . GASTRIC BYPASS    . TONSILLECTOMY       Family History  Problem Relation Age of Onset  . Cancer Mother     colon  . Epilepsy Mother   . Glaucoma Father     Social History   Social History  . Marital status: Married    Spouse name: N/A  . Number of children: N/A  . Years of education: N/A   Occupational History  . Not on file.   Social History Main Topics  . Smoking status: Current Every Day Smoker    Packs/day: 1.00    Years: 14.00    Types: Cigarettes    Start date: 06/11/2000  . Smokeless tobacco: Never Used  . Alcohol use No     Comment: rarely  . Drug use: No  . Sexual activity: Yes    Partners: Male   Other Topics Concern  . Not on file   Social History Narrative  . No narrative on file     Current Outpatient Prescriptions:  .  ALPRAZolam (XANAX) 0.5 MG tablet, Take 1 tablet (0.5 mg total) by mouth 2 (two) times daily as needed. To be filled 01/07/16, Disp: 60 tablet, Rfl: 0 .  ciprofloxacin (CIPRO) 250 MG tablet, Take 1 tablet (250 mg total) by mouth 2 (two) times daily., Disp: 14 tablet, Rfl: 0 .  cyclobenzaprine (FLEXERIL) 10 MG tablet, TAKE 1 TABLET BY MOUTH EVERY 8 HOURS AS NEEDED FOR MUSCLE SPASMS, Disp: 90 tablet, Rfl: 1 .  DULoxetine (CYMBALTA)  30 MG capsule, Take 1 capsule (30 mg total) by mouth daily., Disp: 30 capsule, Rfl: 0 .  nystatin-triamcinolone (MYCOLOG II) cream, Reported on 11/15/2015, Disp: , Rfl: 0 .  omeprazole (PRILOSEC) 40 MG capsule, Take 1 capsule (40 mg total) by mouth daily., Disp: 30 capsule, Rfl: 2 .  phenazopyridine (PYRIDIUM) 200 MG tablet, Take 1 tablet (200 mg total) by mouth 3 (three) times daily as needed for pain., Disp: 10 tablet, Rfl: 0 .  QUEtiapine (SEROQUEL) 100 MG tablet, Take 1 tablet (100 mg total) by mouth at bedtime., Disp: 30 tablet, Rfl: 0 .  QUEtiapine (SEROQUEL) 25 MG tablet, Take 1 tablet (25 mg total) by mouth 2 (two) times daily., Disp: 60 tablet, Rfl: 0  Current Facility-Administered Medications:  .  lidocaine (PF) (XYLOCAINE) 1 % injection 4  mL, 4 mL, Intradermal, Once, Steele Sizer, MD  Allergies  Allergen Reactions  . Vicodin [Hydrocodone-Acetaminophen] Other (See Comments)    Has really bad headaches/ migraine       ROS  Ten systems reviewed and is negative except as mentioned in HPI   Objective  Vitals:   01/27/16 1346  BP: 126/82  Pulse: (!) 126  Resp: 18  Temp: 98.9 F (37.2 C)  TempSrc: Oral  SpO2: 98%  Weight: 165 lb (74.8 kg)  Height: 5\' 5"  (1.651 m)    Body mass index is 27.46 kg/m.  Physical Exam  Constitutional: Patient appears well-developed and well-nourished. No distress.  HEENT: head atraumatic, normocephalic, pupils equal and reactive to light, e neck supple, throat within normal limits Cardiovascular: Normal rate, regular rhythm and normal heart sounds.  No murmur heard. No BLE edema. Pulmonary/Chest: Effort normal and breath sounds normal. No respiratory distress. Abdominal: Soft.  There is no tenderness. Psychiatric: Patient has a normal mood and affect. behavior is normal. Judgment and thought content normal. Muscular Skeletal: well healed right bunion, some swelling   Recent Results (from the past 2160 hour(s))  POCT Urinalysis Dipstick (CPT 81002)     Status: Abnormal   Collection Time: 01/23/16  3:00 PM  Result Value Ref Range   Color, UA yellow    Clarity, UA clear    Glucose, UA neg    Bilirubin, UA neg    Ketones, UA neg    Spec Grav, UA <=1.005    Blood, UA 3+    pH, UA 5.5    Protein, UA neg    Urobilinogen, UA 0.2    Nitrite, UA neg    Leukocytes, UA small (1+) (A) Negative      PHQ2/9: Depression screen [facility] 2/9 12/02/2015 11/08/2015 10/28/2015 06/17/2015 04/22/2015  Decreased Interest 0 1 1 0 0  Down, Depressed, Hopeless 1 3 3  0 0  PHQ - 2 Score 1 4 4  0 0  Altered sleeping 0 2 3 - -  Tired, decreased energy 0 1 3 - -  Change in appetite 0 0 2 - -  Feeling bad or failure about yourself  1 1 2  - -  Trouble concentrating - 2 3 - -  Moving slowly or  fidgety/restless 1 2 3  - -  Suicidal thoughts 0 0 0 - -  PHQ-9 Score 3 12 20  - -  Difficult doing work/chores - Extremely dIfficult Extremely dIfficult - -     Fall Risk: Fall Risk  12/02/2015 11/08/2015 10/28/2015 06/17/2015 04/22/2015  Falls in the past year? No No No No No    Assessment & Plan   1. Bunion of great toe  Disability  forms filled out, return to work September 20th or when cleared by podiatrist  2. Anxiety, generalized  Doing much better, seeing Dr. Maricela Curet   3. Episodic mood disorder ([place])  stable

## 2016-01-30 DIAGNOSIS — M25579 Pain in unspecified ankle and joints of unspecified foot: Secondary | ICD-10-CM | POA: Diagnosis not present

## 2016-01-31 ENCOUNTER — Other Ambulatory Visit: Payer: Self-pay | Admitting: Podiatry

## 2016-01-31 ENCOUNTER — Ambulatory Visit (INDEPENDENT_AMBULATORY_CARE_PROVIDER_SITE_OTHER): Payer: 59 | Admitting: Psychiatry

## 2016-01-31 ENCOUNTER — Telehealth: Payer: Self-pay | Admitting: Podiatry

## 2016-01-31 ENCOUNTER — Telehealth: Payer: Self-pay | Admitting: *Deleted

## 2016-01-31 ENCOUNTER — Encounter: Payer: Self-pay | Admitting: Psychiatry

## 2016-01-31 VITALS — BP 121/81 | HR 108 | Temp 98.9°F | Ht 65.0 in | Wt 165.4 lb

## 2016-01-31 DIAGNOSIS — F39 Unspecified mood [affective] disorder: Secondary | ICD-10-CM | POA: Diagnosis not present

## 2016-01-31 DIAGNOSIS — F411 Generalized anxiety disorder: Secondary | ICD-10-CM

## 2016-01-31 DIAGNOSIS — G4700 Insomnia, unspecified: Secondary | ICD-10-CM

## 2016-01-31 MED ORDER — QUETIAPINE FUMARATE 25 MG PO TABS: 25.0000 mg | ORAL_TABLET | Freq: Two times a day (BID) | ORAL | 1 refills | Status: DC

## 2016-01-31 MED ORDER — ALPRAZOLAM 0.5 MG PO TABS: 0.5000 mg | ORAL_TABLET | Freq: Two times a day (BID) | ORAL | 1 refills | Status: DC | PRN

## 2016-01-31 MED ORDER — DULOXETINE HCL 30 MG PO CPEP: 30.0000 mg | ORAL_CAPSULE | Freq: Every day | ORAL | 1 refills | Status: DC

## 2016-01-31 MED ORDER — OXYCODONE-ACETAMINOPHEN 5-325 MG PO TABS: 1.0000 | ORAL_TABLET | Freq: Three times a day (TID) | ORAL | 0 refills | Status: DC | PRN

## 2016-01-31 MED ORDER — QUETIAPINE FUMARATE 100 MG PO TABS: 100.0000 mg | ORAL_TABLET | Freq: Every day | ORAL | 0 refills | Status: DC

## 2016-01-31 NOTE — Telephone Encounter (Signed)
Pt request refill of the pain medication, states she began PT yesterday.

## 2016-01-31 NOTE — Telephone Encounter (Signed)
Pt need refill on pain meds due to PT

## 2016-01-31 NOTE — Progress Notes (Signed)
Psychiatric MD Progress Note   Patient Identification: Jessica [name] MRN:  [id number] Date of Evaluation:  01/31/2016 Referral Source: PCP Chief Complaint:   Chief Complaint    Follow-up; Medication Refill     Visit Diagnosis:    ICD-9-CM ICD-10-CM   1. Episodic mood disorder ([place]) 296.90 F39   2. Anxiety, generalized 300.02 F41.1     History of Present Illness:    Patient is a 50 year old married female with history of mood symptoms presented for Follow-up.She was Walking with a limp and she reported that she has started physical therapy at least 3 times per week. She reported that she has started improving on her medications. She is not taking Percocet at this time. She reported that she continues to have pain and is taking ibuprofen on a regular basis. Patient reported that she did well on the Seroquel 25 mg twice a day and her mood symptoms are improving. She also takes Seroquel 100 mg at bedtime. We discussed about her medications at length    Patient appeared anxious and apprehensive about her daughter's behavior. She is going to discuss with her about her medications. Patient currently denied having any suicidal ideations or plans. Her mood appears stable. She denied having any perceptual disturbances. She wants to continue the medications at this time. She is also worried  about returning to work in a month.     Associated Signs/Symptoms: Depression Symptoms:  insomnia, fatigue, anxiety, (Hypo) Manic Symptoms:  Distractibility, Flight of Ideas, Community education officer, Impulsivity, Irritable Mood, Labiality of Mood, Anxiety Symptoms:  Excessive Worry, Panic Symptoms, Psychotic Symptoms:  none PTSD Symptoms: Negative NA  Past Psychiatric History:  No h/o admission No h/o SA    Previous Psychotropic Medications:  Prozac Paxil Effexor- worked best, increased LFT Cymbalta  seroquel xanax  Substance Abuse History in the last 12 months:  Yes.    Social  drinker   Consequences of Substance Abuse: Negative NA  Past Medical History:  Past Medical History:  Diagnosis Date  . ADHD (attention deficit hyperactivity disorder)   . Anxiety   . Depression   . Hypertension     Past Surgical History:  Procedure Laterality Date  . ABDOMINAL HYSTERECTOMY  2008  . CARPAL TUNNEL RELEASE    . GASTRIC BYPASS    . TONSILLECTOMY      Family Psychiatric History:  She denied any family history of psychiatric illness   Family History:  Family History  Problem Relation Age of Onset  . Cancer Mother     colon  . Epilepsy Mother   . Glaucoma Father     Social History:   Social History   Social History  . Marital status: Married    Spouse name: N/A  . Number of children: N/A  . Years of education: N/A   Social History Main Topics  . Smoking status: Current Every Day Smoker    Packs/day: 1.00    Years: 14.00    Types: Cigarettes    Start date: 06/11/2000  . Smokeless tobacco: Never Used  . Alcohol use No     Comment: rarely  . Drug use: No  . Sexual activity: Yes    Partners: Male   Other Topics Concern  . None   Social History Narrative  . None    Additional Social History:  Married x 17 years for first times.  Has children - 2 , 61 and 23  Now married x 9 years - Has good relationship with husband  Allergies:   Allergies  Allergen Reactions  . Vicodin [Hydrocodone-Acetaminophen] Other (See Comments)    Has really bad headaches/ migraine      Metabolic Disorder Labs: Lab Results  Component Value Date   HGBA1C 6.3 10/24/2011   No results found for: PROLACTIN Lab Results  Component Value Date   CHOL 144 10/29/2014   TRIG 91 10/29/2014   HDL 49 10/29/2014   CHOLHDL 2.9 10/29/2014   VLDL 18 10/29/2014   LDLCALC 77 10/29/2014   LDLCALC 53 07/08/2013     Current Medications: Current Outpatient Prescriptions  Medication Sig Dispense Refill  . ALPRAZolam (XANAX) 0.5 MG tablet Take 1 tablet (0.5 mg total)  by mouth 2 (two) times daily as needed. To be filled 01/07/16 60 tablet 0  . ciprofloxacin (CIPRO) 250 MG tablet Take 1 tablet (250 mg total) by mouth 2 (two) times daily. 14 tablet 0  . cyclobenzaprine (FLEXERIL) 10 MG tablet TAKE 1 TABLET BY MOUTH EVERY 8 HOURS AS NEEDED FOR MUSCLE SPASMS 90 tablet 1  . DULoxetine (CYMBALTA) 30 MG capsule Take 1 capsule (30 mg total) by mouth daily. 30 capsule 0  . nystatin-triamcinolone (MYCOLOG II) cream Reported on 11/15/2015  0  . omeprazole (PRILOSEC) 40 MG capsule Take 1 capsule (40 mg total) by mouth daily. 30 capsule 2  . phenazopyridine (PYRIDIUM) 200 MG tablet Take 1 tablet (200 mg total) by mouth 3 (three) times daily as needed for pain. 10 tablet 0  . QUEtiapine (SEROQUEL) 100 MG tablet Take 1 tablet (100 mg total) by mouth at bedtime. 30 tablet 0  . QUEtiapine (SEROQUEL) 25 MG tablet Take 1 tablet (25 mg total) by mouth 2 (two) times daily. 60 tablet 0   Current Facility-Administered Medications  Medication Dose Route Frequency Provider Last Rate Last Dose  . lidocaine (PF) (XYLOCAINE) 1 % injection 4 mL  4 mL Intradermal Once Steele Sizer, MD        Neurologic: Headache: No Seizure: No Paresthesias:No  Musculoskeletal: Strength & Muscle Tone: within normal limits Gait & Station: normal Patient leans: N/A  Psychiatric Specialty Exam: Review of Systems  Musculoskeletal: Positive for joint pain.  Psychiatric/Behavioral: Positive for depression. The patient is nervous/anxious.     Blood pressure 121/81, pulse (!) 108, temperature 98.9 F (37.2 C), temperature source Oral, height 5\' 5"  (1.651 m), weight 165 lb 6.4 oz (75 kg).Body mass index is 27.52 kg/m.  General Appearance: Well Groomed  Eye Contact:  Fair  Speech:  Pressured  Volume:  Normal  Mood:  Anxious and Irritable  Affect:  Congruent  Thought Process:  Disorganized  Orientation:  Full (Time, Place, and Person)  Thought Content:  Tangential  Suicidal Thoughts:  No   Homicidal Thoughts:  No  Memory:  Immediate;   Fair Recent;   Fair Remote;   Fair  Judgement:  Impaired  Insight:  Fair  Psychomotor Activity:  Increased  Concentration:  Concentration: Fair and Attention Span: Fair  Recall:  [employer] of Knowledge:Fair  Language: Fair  Akathisia:  No  Handed:  Right  AIMS (if indicated):    Assets:  Communication Skills Desire for Improvement Physical Health Social Support Transportation  ADL's:  Intact  Cognition: WNL  Sleep:      Treatment Plan Summary: Medication management   Discussed with patient at length about the medications treatment risks benefits and alternatives Continue Cymbalta 30 mg daily. Continue  Seroquel 100 mg by mouth daily at bedtime and she will also takes Seroquel  25 mg by mouth twice a day when necessary for her more symptoms during the daytime. She will take Xanax 0.5 mg BID prn when necessary for her anxiety  Follow-up in 4  weeks or earlier depending on her symptoms  She is currently on FMLA and her primary care physician will release her to go to work when she becomes clinically stable   More than 50% of the time spent in psychoeducation, counseling and coordination of care.    This note was generated in part or whole with voice recognition software. Voice regonition is usually quite accurate but there are transcription errors that can and very often do occur. I apologize for any typographical errors that were not detected and corrected.    Rainey Pines, MD 8/22/20179:00 AM

## 2016-01-31 NOTE — Telephone Encounter (Signed)
Jessica [name] called her and took care of this.

## 2016-01-31 NOTE — Addendum Note (Signed)
Addended by: Cranford Mon R on: 01/31/2016 04:54 PM   Modules accepted: Orders

## 2016-02-01 DIAGNOSIS — M25579 Pain in unspecified ankle and joints of unspecified foot: Secondary | ICD-10-CM | POA: Diagnosis not present

## 2016-02-02 ENCOUNTER — Telehealth: Payer: Self-pay | Admitting: *Deleted

## 2016-02-02 NOTE — Telephone Encounter (Signed)
Patient on Tuesday 02/01/16 and stated went to physical therapy and her foot felt tight and then went to her pcp and they stated that her leg was swollen up to the leg and was sore and patient asked for pain medicine and per Dr Jacqualyn Posey prescribed Percocet 5/325, quantity 20 and stated to call the office if any questions or concerns. Lattie Haw

## 2016-02-03 ENCOUNTER — Other Ambulatory Visit: Payer: Self-pay | Admitting: Psychiatry

## 2016-02-03 ENCOUNTER — Other Ambulatory Visit: Payer: Self-pay | Admitting: Family Medicine

## 2016-02-03 DIAGNOSIS — M25579 Pain in unspecified ankle and joints of unspecified foot: Secondary | ICD-10-CM | POA: Diagnosis not present

## 2016-02-03 DIAGNOSIS — G4700 Insomnia, unspecified: Secondary | ICD-10-CM

## 2016-02-03 NOTE — Telephone Encounter (Signed)
Patient requesting refill of Flexeril be sent to [facility].

## 2016-02-06 DIAGNOSIS — M25579 Pain in unspecified ankle and joints of unspecified foot: Secondary | ICD-10-CM | POA: Diagnosis not present

## 2016-02-08 DIAGNOSIS — M25579 Pain in unspecified ankle and joints of unspecified foot: Secondary | ICD-10-CM | POA: Diagnosis not present

## 2016-02-16 ENCOUNTER — Encounter: Payer: Self-pay | Admitting: Podiatry

## 2016-02-16 ENCOUNTER — Ambulatory Visit (INDEPENDENT_AMBULATORY_CARE_PROVIDER_SITE_OTHER): Payer: 59

## 2016-02-16 ENCOUNTER — Ambulatory Visit (INDEPENDENT_AMBULATORY_CARE_PROVIDER_SITE_OTHER): Payer: 59 | Admitting: Podiatry

## 2016-02-16 DIAGNOSIS — M2010 Hallux valgus (acquired), unspecified foot: Secondary | ICD-10-CM

## 2016-02-16 DIAGNOSIS — Z09 Encounter for follow-up examination after completed treatment for conditions other than malignant neoplasm: Secondary | ICD-10-CM

## 2016-02-16 MED ORDER — OXYCODONE-ACETAMINOPHEN 5-325 MG PO TABS: 1.0000 | ORAL_TABLET | Freq: Three times a day (TID) | ORAL | 0 refills | Status: DC | PRN

## 2016-02-23 NOTE — Progress Notes (Signed)
Patient ID: Jessica [name], female   DOB: October 05, 1965, 50 y.o.   MRN: [id number]  Subjective: Jessica [name] is a 50 y.o. is seen today in office s/p right Austin bunionectomy preformed on 12/07/15.  She states that overall she is doing well and she is happy the result. She still gets some numbness the big toe but this is improving as well. She is asking to go back to work today. She is wearing her regular shoe when she is not having pain. She is continuing range of motion exercises of the first MPJ. She gets some swelling the day after being on her foot all day. Denies any systemic complaints such as fevers, chills, nausea, vomiting. No calf pain, chest pain, shortness of breath.   Objective: General: No acute distress, AAOx3  DP/PT pulses palpable 2/4, CRT < 3 sec to all digits.  Protective sensation intact. Motor function intact.  Right foot: Incision is well coapted without any evidence of dehiscence and a scar has formed. There is no surrounding erythema, ascending cellulitis, fluctuance, crepitus, malodor, drainage/purulence. There is trace edema around the surgical site. There is no pain along the surgical site. The toe sits in a rectus position. Range of motion of first MTPJ has improved. There is no pain with MPJ range of motion today.  No other areas of tenderness to bilateral lower extremities.  No other open lesions or pre-ulcerative lesions.  No pain with calf compression, swelling, warmth, erythema.   Assessment and Plan:  Status post right [facility], doing well with no complications   -Treatment options discussed including all alternatives, risks, and complications -X-rays obtained and reviewed today. Increased consolidation across the osteotomy site. Hardware intact. -Continue range of motion exercises. Continue compression wrap. Ice and elevation. She can return to work starting with half shifts every other day as she currently works 3 days a week. She does were 2 weeks  before going back to full duty. -Follow up as scheduled. Call any questions or concerns meantime.  Celesta Gentile, DPM

## 2016-02-29 ENCOUNTER — Ambulatory Visit: Payer: 59 | Admitting: Psychiatry

## 2016-03-01 ENCOUNTER — Ambulatory Visit (INDEPENDENT_AMBULATORY_CARE_PROVIDER_SITE_OTHER): Payer: 59 | Admitting: Psychiatry

## 2016-03-01 DIAGNOSIS — F411 Generalized anxiety disorder: Secondary | ICD-10-CM | POA: Diagnosis not present

## 2016-03-01 DIAGNOSIS — F39 Unspecified mood [affective] disorder: Secondary | ICD-10-CM

## 2016-03-01 DIAGNOSIS — G4700 Insomnia, unspecified: Secondary | ICD-10-CM | POA: Diagnosis not present

## 2016-03-01 MED ORDER — QUETIAPINE FUMARATE 100 MG PO TABS: 100.0000 mg | ORAL_TABLET | Freq: Every day | ORAL | 0 refills | Status: DC

## 2016-03-01 MED ORDER — DULOXETINE HCL 30 MG PO CPEP: 30.0000 mg | ORAL_CAPSULE | Freq: Every day | ORAL | 1 refills | Status: DC

## 2016-03-01 NOTE — Progress Notes (Signed)
Psychiatric MD Progress Note   Patient Identification: Jessica [name] MRN:  [id number] Date of Evaluation:  03/01/2016 Referral Source: PCP Chief Complaint:    Visit Diagnosis:  No diagnosis found.  History of Present Illness:    Patient is a 50 year old married female with history of mood symptoms presented for Follow-up.She is going back to her work and was feeling anxious. 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 Patient reported that she is already feeling anxious this morning and has taken her medications. She reported that she feels her medications are helping her. She does not want to take any medications at this time. 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 She currently denied having any suicidal homicidal ideations or plans. She appeared calm and alert during the interview. She appears somewhat hyper. She denied having any side effects from the medications.     Associated Signs/Symptoms: Depression Symptoms:  insomnia, fatigue, anxiety, (Hypo) Manic Symptoms:  Flight of Ideas, Impulsivity, Labiality of Mood, Anxiety Symptoms:  Excessive Worry, Panic Symptoms, Psychotic Symptoms:  none PTSD Symptoms: Negative NA  Past Psychiatric History:  No h/o admission No h/o SA    Previous Psychotropic Medications:  Prozac Paxil Effexor- worked best, increased LFT Cymbalta  seroquel xanax  Substance Abuse History in the last 12 months:  Yes.    Social drinker   Consequences of Substance Abuse: Negative NA  Past Medical History:  Past Medical History:  Diagnosis Date  . ADHD (attention deficit hyperactivity disorder)   . Anxiety   . Depression   . Hypertension     Past Surgical History:  Procedure Laterality Date  . ABDOMINAL HYSTERECTOMY  2008  . CARPAL TUNNEL RELEASE    . GASTRIC BYPASS    . TONSILLECTOMY       Family Psychiatric History:  She denied any family history of psychiatric illness   Family History:  Family History  Problem Relation Age of Onset  . Cancer Mother     colon  . Epilepsy Mother   . Glaucoma Father     Social History:   Social History   Social History  . Marital status: Married    Spouse name: N/A  . Number of children: N/A  . Years of education: N/A   Social History Main Topics  . Smoking status: Current Every Day Smoker    Packs/day: 1.00    Years: 14.00    Types: Cigarettes    Start date: 06/11/2000  . Smokeless tobacco: Never Used  . Alcohol use No     Comment: rarely  . Drug use: No  . Sexual activity: Yes    Partners: Male   Other Topics Concern  . Not on file   Social History Narrative  . No narrative on file    Additional Social History:  Married x 17 years for first times.  Has children - 2 , 10 and 23  Now married x 9 years - Has good relationship with husband   Allergies:   Allergies  Allergen Reactions  . Vicodin [Hydrocodone-Acetaminophen] Other (See Comments)    Has really bad headaches/ migraine      Metabolic Disorder Labs: Lab Results  Component Value Date   HGBA1C 6.3 10/24/2011   No results found for: PROLACTIN Lab Results  Component Value Date   CHOL 144 10/29/2014  TRIG 91 10/29/2014   HDL 49 10/29/2014   CHOLHDL 2.9 10/29/2014   VLDL 18 10/29/2014   LDLCALC 77 10/29/2014   LDLCALC 53 07/08/2013     Current Medications: Current Outpatient Prescriptions  Medication Sig Dispense Refill  . ALPRAZolam (XANAX) 0.5 MG tablet Take 1 tablet (0.5 mg total) by mouth 2 (two) times daily as needed. To be filled 01/07/16 60 tablet 1  . ciprofloxacin (CIPRO) 250 MG tablet Take 1 tablet (250 mg total) by mouth 2 (two) times daily. 14 tablet 0  . cyclobenzaprine (FLEXERIL) 10 MG tablet TAKE 1 TABLET BY MOUTH EVERY 8 HOURS AS NEEDED FOR MUSCLE SPASMS 90 tablet 0  . DULoxetine (CYMBALTA) 30 MG capsule Take 1 capsule  (30 mg total) by mouth daily. 30 capsule 1  . nystatin-triamcinolone (MYCOLOG II) cream Reported on 11/15/2015  0  . omeprazole (PRILOSEC) 40 MG capsule Take 1 capsule (40 mg total) by mouth daily. 30 capsule 2  . oxyCODONE-acetaminophen (ROXICET) 5-325 MG tablet Take 1 tablet by mouth every 8 (eight) hours as needed for severe pain. 20 tablet 0  . phenazopyridine (PYRIDIUM) 200 MG tablet Take 1 tablet (200 mg total) by mouth 3 (three) times daily as needed for pain. 10 tablet 0  . QUEtiapine (SEROQUEL) 100 MG tablet Take 1 tablet (100 mg total) by mouth at bedtime. 30 tablet 0  . QUEtiapine (SEROQUEL) 25 MG tablet Take 1 tablet (25 mg total) by mouth 2 (two) times daily. 60 tablet 1   Current Facility-Administered Medications  Medication Dose Route Frequency Provider Last Rate Last Dose  . lidocaine (PF) (XYLOCAINE) 1 % injection 4 mL  4 mL Intradermal Once Steele Sizer, MD        Neurologic: Headache: No Seizure: No Paresthesias:No  Musculoskeletal: Strength & Muscle Tone: within normal limits Gait & Station: normal Patient leans: N/A  Psychiatric Specialty Exam: Review of Systems  Musculoskeletal: Positive for joint pain.  Psychiatric/Behavioral: Positive for depression. The patient is nervous/anxious.     There were no vitals taken for this visit.There is no height or weight on file to calculate BMI.  General Appearance: Well Groomed  Eye Contact:  Fair  Speech:  Clear and Coherent  Volume:  Normal  Mood:  Anxious and Irritable  Affect:  Congruent  Thought Process:  Coherent  Orientation:  Full (Time, Place, and Person)  Thought Content:  Tangential  Suicidal Thoughts:  No  Homicidal Thoughts:  No  Memory:  Immediate;   Fair Recent;   Fair Remote;   Fair  Judgement:  Good  Insight:  Fair  Psychomotor Activity:  Increased  Concentration:  Concentration: Fair and Attention Span: Fair  Recall:  [employer] of Knowledge:Fair  Language: Fair  Akathisia:  No  Handed:   Right  AIMS (if indicated):    Assets:  Communication Skills Desire for Improvement Physical Health Social Support Transportation  ADL's:  Intact  Cognition: WNL  Sleep:      Treatment Plan Summary: Medication management   Discussed with patient at length about the medications treatment risks benefits and alternatives Continue Cymbalta 30 mg daily. Continue  Seroquel 100 mg by mouth daily at bedtime and she will also takes Seroquel 25 mg by mouth twice a day when necessary for her more symptoms during the daytime. She will take Xanax 0.5 mg BID prn when necessary for her anxiety  Follow-up in 2 weeks or earlier depending on her symptoms  She  is returning to work today  More than 50% of the time spent in psychoeducation, counseling and coordination of care.    This note was generated in part or whole with voice recognition software. Voice regonition is usually quite accurate but there are transcription errors that can and very often do occur. I apologize for any typographical errors that were not detected and corrected.    Rainey Pines, MD 9/21/20178:47 AM

## 2016-03-02 ENCOUNTER — Other Ambulatory Visit: Payer: Self-pay | Admitting: Family Medicine

## 2016-03-02 NOTE — Telephone Encounter (Signed)
Patient requesting refill of Flexeril to [facility].  

## 2016-03-26 ENCOUNTER — Ambulatory Visit: Payer: Self-pay | Admitting: Physician Assistant

## 2016-03-26 ENCOUNTER — Encounter: Payer: Self-pay | Admitting: Physician Assistant

## 2016-03-26 VITALS — BP 140/82 | HR 80 | Temp 98.6°F

## 2016-03-26 DIAGNOSIS — J209 Acute bronchitis, unspecified: Secondary | ICD-10-CM

## 2016-03-26 MED ORDER — IPRATROPIUM-ALBUTEROL 0.5-2.5 (3) MG/3ML IN SOLN: 3.0000 mL | Freq: Four times a day (QID) | RESPIRATORY_TRACT | Status: DC

## 2016-03-26 MED ORDER — AZITHROMYCIN 250 MG PO TABS
ORAL_TABLET | ORAL | 0 refills | Status: DC
Start: 2016-03-26 — End: 2016-05-08

## 2016-03-26 MED ORDER — ALBUTEROL SULFATE HFA 108 (90 BASE) MCG/ACT IN AERS: 2.0000 | INHALATION_SPRAY | Freq: Four times a day (QID) | RESPIRATORY_TRACT | 0 refills | Status: DC | PRN

## 2016-03-26 MED ORDER — BENZONATATE 200 MG PO CAPS: 200.0000 mg | ORAL_CAPSULE | Freq: Two times a day (BID) | ORAL | 0 refills | Status: DC | PRN

## 2016-03-26 NOTE — Progress Notes (Signed)
S: C/o cough and congestion with wheezing and chest pain, chest is sore from coughing, denies fever, chills,  cough is dry and hacking; keeping pt awake at night;  denies cardiac type chest pain or sob, v/d, abd pain Remainder ros neg  O: vitals wnl, nad, tms clear, throat injected, neck supple no lymph, lungs with wheezing b/l cv rrr, neuro intact, svn duoneb given, wheezing decreased, air movement increased, cough clears airway  A:  Acute bronchitis   P:  rx medication:  Zpack, albuterol inhaler, tessalon perls; smoking cessation, use otc meds, tylenol or motrin as needed for fever/chills, return if not better in 3 -5 days, return earlier if worsening

## 2016-03-27 ENCOUNTER — Ambulatory Visit: Payer: Self-pay | Admitting: Physician Assistant

## 2016-03-29 ENCOUNTER — Ambulatory Visit: Payer: 59 | Admitting: Podiatry

## 2016-04-02 ENCOUNTER — Other Ambulatory Visit: Payer: Self-pay | Admitting: Psychiatry

## 2016-04-02 ENCOUNTER — Other Ambulatory Visit: Payer: Self-pay | Admitting: Family Medicine

## 2016-04-02 DIAGNOSIS — G4700 Insomnia, unspecified: Secondary | ICD-10-CM

## 2016-04-05 ENCOUNTER — Ambulatory Visit (INDEPENDENT_AMBULATORY_CARE_PROVIDER_SITE_OTHER): Payer: 59 | Admitting: Psychiatry

## 2016-04-05 ENCOUNTER — Ambulatory Visit: Payer: 59 | Admitting: Podiatry

## 2016-04-05 ENCOUNTER — Telehealth: Payer: Self-pay

## 2016-04-05 ENCOUNTER — Encounter: Payer: Self-pay | Admitting: Psychiatry

## 2016-04-05 VITALS — BP 127/85 | HR 120 | Temp 98.4°F | Wt 178.8 lb

## 2016-04-05 DIAGNOSIS — F411 Generalized anxiety disorder: Secondary | ICD-10-CM

## 2016-04-05 DIAGNOSIS — F39 Unspecified mood [affective] disorder: Secondary | ICD-10-CM | POA: Diagnosis not present

## 2016-04-05 DIAGNOSIS — F900 Attention-deficit hyperactivity disorder, predominantly inattentive type: Secondary | ICD-10-CM

## 2016-04-05 MED ORDER — QUETIAPINE FUMARATE 100 MG PO TABS: 100.0000 mg | ORAL_TABLET | Freq: Every day | ORAL | 0 refills | Status: DC

## 2016-04-05 MED ORDER — LITHIUM CARBONATE ER 300 MG PO TBCR: 300.0000 mg | EXTENDED_RELEASE_TABLET | Freq: Every day | ORAL | 0 refills | Status: DC

## 2016-04-05 MED ORDER — QUETIAPINE FUMARATE 25 MG PO TABS: 25.0000 mg | ORAL_TABLET | Freq: Two times a day (BID) | ORAL | 1 refills | Status: DC

## 2016-04-05 MED ORDER — AMPHETAMINE-DEXTROAMPHETAMINE 10 MG PO TABS: 10.0000 mg | ORAL_TABLET | Freq: Every day | ORAL | 0 refills | Status: DC

## 2016-04-05 MED ORDER — DULOXETINE HCL 30 MG PO CPEP: 30.0000 mg | ORAL_CAPSULE | Freq: Every day | ORAL | 1 refills | Status: DC

## 2016-04-05 MED ORDER — ALPRAZOLAM 0.5 MG PO TABS: 0.5000 mg | ORAL_TABLET | Freq: Every evening | ORAL | 0 refills | Status: DC | PRN

## 2016-04-05 NOTE — Progress Notes (Signed)
Psychiatric MD Progress Note   Patient Identification: Jessica [name] MRN:  [id number] Date of Evaluation:  04/05/2016 Referral Source: PCP Chief Complaint:   Chief Complaint    Follow-up; Medication Refill     Visit Diagnosis:    ICD-9-CM ICD-10-CM   1. Episodic mood disorder ([place]) 296.90 F39   2. Anxiety, generalized 300.02 F41.1 ALPRAZolam (XANAX) 0.5 MG tablet  3. Attention deficit hyperactivity disorder (ADHD), predominantly inattentive type 314.00 F90.0     History of Present Illness:    Patient is a 50 year old married female with history of mood symptoms presented for Follow-up.She Was noted to be very hyperactive and energetic during the interview. 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 She reported that she is unable to focus and concentrate. She is full of energy. She wakes up at 4 AM and has been doing house hold chores when she is off. She has clean her house since this morning. She is trying to distract herself by working on the [employer]. She feels that her mind is racing all the time. She reported that she cannot control her behavior. Her coworkers things that she might have ADD. Patient reported that she also took Xanax at work and then she was sleeping over there. She is compliant with her medications and reported that she has been taking Seroquel but it has not been helpful. Patient appeared apprehensive and anxious during the interview. We discussed about the medications in detail. She is interested in taking Adderall at this time.  Patient takes Benadryl 50 mg at bedtime. She has also been using NyQuil. She drinks a pot of non-caffeinated coffee during the daytime. She has been smoking 1 pack of cigarettes per day. We discussed about cutting down on all caffeinated beverages throughout the day.     Associated Signs/Symptoms: Depression Symptoms:  insomnia, fatigue, anxiety, (Hypo) Manic Symptoms:   Distractibility, Flight of Ideas, Impulsivity, Irritable Mood, Labiality of Mood, Anxiety Symptoms:  Excessive Worry, Panic Symptoms, Psychotic Symptoms:  none PTSD Symptoms: Negative NA  Past Psychiatric History:  No h/o admission No h/o SA    Previous Psychotropic Medications:  Prozac Paxil Effexor- worked best, increased LFT Cymbalta  seroquel xanax  Substance Abuse History in the last 12 months:  Yes.    Social drinker   Consequences of Substance Abuse: Negative NA  Past Medical History:  Past Medical History:  Diagnosis Date  . ADHD (attention deficit hyperactivity disorder)   . Anxiety   . Depression   . Hypertension     Past Surgical History:  Procedure Laterality Date  . ABDOMINAL HYSTERECTOMY  2008  . CARPAL TUNNEL RELEASE    . GASTRIC BYPASS    . TONSILLECTOMY      Family Psychiatric History:  She denied any family history of psychiatric illness   Family History:  Family History  Problem Relation Age of Onset  . Cancer Mother     colon  . Epilepsy Mother   . Glaucoma Father     Social History:   Social History   Social History  . Marital status: Married    Spouse name: N/A  . Number of children: N/A  . Years of education: N/A   Social History Main Topics  . Smoking status: Current Every Day Smoker    Packs/day: 1.00    Years: 14.00    Types: Cigarettes    Start date: 06/11/2000  . Smokeless tobacco: Never Used  . Alcohol  use No     Comment: rarely  . Drug use: No  . Sexual activity: Yes    Partners: Male   Other Topics Concern  . None   Social History Narrative  . None    Additional Social History:  Married x 17 years for first times.  Has children - 2 , 85 and 23  Now married x 9 years - Has good relationship with husband   Allergies:   Allergies  Allergen Reactions  . Vicodin [Hydrocodone-Acetaminophen] Other (See Comments)    Has really bad headaches/ migraine      Metabolic Disorder Labs: Lab Results   Component Value Date   HGBA1C 6.3 10/24/2011   No results found for: PROLACTIN Lab Results  Component Value Date   CHOL 144 10/29/2014   TRIG 91 10/29/2014   HDL 49 10/29/2014   CHOLHDL 2.9 10/29/2014   VLDL 18 10/29/2014   LDLCALC 77 10/29/2014   LDLCALC 53 07/08/2013     Current Medications: Current Outpatient Prescriptions  Medication Sig Dispense Refill  . albuterol (PROVENTIL HFA;VENTOLIN HFA) 108 (90 Base) MCG/ACT inhaler Inhale 2 puffs into the lungs every 6 (six) hours as needed for wheezing or shortness of breath. 1 Inhaler 0  . ALPRAZolam (XANAX) 0.5 MG tablet Take 1 tablet (0.5 mg total) by mouth at bedtime as needed. 30 tablet 0  . azithromycin (ZITHROMAX Z-PAK) 250 MG tablet 2 pills today then 1 pill a day for 4 days 6 each 0  . benzonatate (TESSALON) 200 MG capsule Take 1 capsule (200 mg total) by mouth 2 (two) times daily as needed for cough. 20 capsule 0  . cyclobenzaprine (FLEXERIL) 10 MG tablet TAKE 1 TABLET BY MOUTH EVERY 8 HOURS AS NEEDED FOR MUSCLE SPASMS 90 tablet 0  . DULoxetine (CYMBALTA) 30 MG capsule Take 1 capsule (30 mg total) by mouth daily. 30 capsule 1  . nystatin-triamcinolone (MYCOLOG II) cream Reported on 11/15/2015  0  . omeprazole (PRILOSEC) 40 MG capsule Take 1 capsule (40 mg total) by mouth daily. 30 capsule 2  . QUEtiapine (SEROQUEL) 100 MG tablet Take 1 tablet (100 mg total) by mouth at bedtime. 30 tablet 0  . QUEtiapine (SEROQUEL) 25 MG tablet Take 1 tablet (25 mg total) by mouth 2 (two) times daily. 60 tablet 1  . amphetamine-dextroamphetamine (ADDERALL) 10 MG tablet Take 1 tablet (10 mg total) by mouth daily with breakfast. 30 tablet 0  . lithium carbonate (LITHOBID) 300 MG CR tablet Take 1 tablet (300 mg total) by mouth at bedtime. 30 tablet 0   Current Facility-Administered Medications  Medication Dose Route Frequency Provider Last Rate Last Dose  . ipratropium-albuterol (DUONEB) 0.5-2.5 (3) MG/3ML nebulizer solution 3 mL  3 mL  Nebulization Q6H Versie Starks, PA-C      . lidocaine (PF) (XYLOCAINE) 1 % injection 4 mL  4 mL Intradermal Once Steele Sizer, MD        Neurologic: Headache: No Seizure: No Paresthesias:No  Musculoskeletal: Strength & Muscle Tone: within normal limits Gait & Station: normal Patient leans: N/A  Psychiatric Specialty Exam: Review of Systems  Musculoskeletal: Positive for joint pain.  Psychiatric/Behavioral: Positive for depression. The patient is nervous/anxious.     Blood pressure 127/85, pulse (!) 120, temperature 98.4 F (36.9 C), temperature source Oral, weight 178 lb 12.8 oz (81.1 kg).Body mass index is 29.75 kg/m.  General Appearance: Well Groomed  Eye Contact:  Fair  Speech:  Pressured  Volume:  Normal  Mood:  Anxious  and Irritable  Affect:  Congruent  Thought Process:  Coherent  Orientation:  Full (Time, Place, and Person)  Thought Content:  Tangential  Suicidal Thoughts:  No  Homicidal Thoughts:  No  Memory:  Immediate;   Fair Recent;   Fair Remote;   Fair  Judgement:  Good  Insight:  Fair  Psychomotor Activity:  Increased  Concentration:  Concentration: Fair and Attention Span: Fair  Recall:  [employer] of Knowledge:Fair  Language: Fair  Akathisia:  No  Handed:  Right  AIMS (if indicated):    Assets:  Communication Skills Desire for Improvement Physical Health Social Support Transportation  ADL's:  Intact  Cognition: WNL  Sleep:      Treatment Plan Summary: Medication management   Discussed with patient at length about the medications treatment risks benefits and alternatives Continue Cymbalta 30 mg daily. Continue  Seroquel 100 mg by mouth daily at bedtime and she will also takes Seroquel 25 mg by mouth twice a day when necessary for her more symptoms during the daytime. She will take Xanax 0.5 mg BID prn when necessary for her anxiety -Patient has supply I will start her on lithium carbonate 300 mg at bedtime to help with the mood  symptoms. I will also start  on Adderall 10 mg daily when necessary when she is working and she agreed with the plan Follow-up in 2 weeks or earlier depending on her symptoms  Advised patient that I will be leaving this office in the end of November and she became more anxious and apprehensive   More than 50% of the time spent in psychoeducation, counseling and coordination of care.    This note was generated in part or whole with voice recognition software. Voice regonition is usually quite accurate but there are transcription errors that can and very often do occur. I apologize for any typographical errors that were not detected and corrected.    Rainey Pines, MD 10/26/20172:53 PM

## 2016-04-12 ENCOUNTER — Ambulatory Visit: Payer: 59 | Admitting: Podiatry

## 2016-04-19 ENCOUNTER — Ambulatory Visit (INDEPENDENT_AMBULATORY_CARE_PROVIDER_SITE_OTHER): Payer: 59 | Admitting: Psychiatry

## 2016-04-19 ENCOUNTER — Encounter: Payer: Self-pay | Admitting: Psychiatry

## 2016-04-19 VITALS — BP 114/78 | HR 87 | Wt 174.0 lb

## 2016-04-19 DIAGNOSIS — F39 Unspecified mood [affective] disorder: Secondary | ICD-10-CM | POA: Diagnosis not present

## 2016-04-19 DIAGNOSIS — F411 Generalized anxiety disorder: Secondary | ICD-10-CM

## 2016-04-19 DIAGNOSIS — F900 Attention-deficit hyperactivity disorder, predominantly inattentive type: Secondary | ICD-10-CM

## 2016-04-19 MED ORDER — AMPHETAMINE-DEXTROAMPHETAMINE 10 MG PO TABS: 10.0000 mg | ORAL_TABLET | Freq: Every day | ORAL | 0 refills | Status: DC

## 2016-04-19 MED ORDER — LITHIUM CARBONATE ER 300 MG PO TBCR: 300.0000 mg | EXTENDED_RELEASE_TABLET | Freq: Every day | ORAL | 1 refills | Status: DC

## 2016-04-19 MED ORDER — QUETIAPINE FUMARATE 100 MG PO TABS: 100.0000 mg | ORAL_TABLET | Freq: Every day | ORAL | 0 refills | Status: DC

## 2016-04-19 NOTE — Progress Notes (Signed)
Psychiatric MD Progress Note   Patient Identification: Jessica [name] MRN:  [id number] Date of Evaluation:  04/19/2016 Referral Source: PCP Chief Complaint:    Visit Diagnosis:    ICD-9-CM ICD-10-CM   1. Episodic mood disorder ([place]) 296.90 F39   2. Anxiety, generalized 300.02 F41.1   3. Attention deficit hyperactivity disorder (ADHD), predominantly inattentive type 314.00 F90.0     History of Present Illness:    Patient is a 50 year old married female with history of mood symptoms presented for Follow-up.She was noted to be very During the interview. She reported that she has responded well to the current combination of medications. She has responded to lithium and has been taking Seroquel at bedtime. She reported that her husband and has coworkers have noted that she is becoming very calm and is able to focus at work. She takes Adderall in the morning. Patient reported that she does not take any Xanax at this time. She is only taking Seroquel 25 mg during the daytime. She reported that she does not take any naps during the daytime. She is was very happy with the current combination of medication and we discussed about her medications. She stated that she is not having any side effects of the medications at this time.  She currently denied having any suicidal homicidal ideations or plans she denied having any perceptual disturbances. She is signing up for more shifts at work.  She is not using any Benadryl or caffeinated beverages.    Associated Signs/Symptoms: Depression Symptoms:  fatigue, anxiety, (Hypo) Manic Symptoms:  Irritable Mood, Labiality of Mood, Anxiety Symptoms:  Excessive Worry, Panic Symptoms, Psychotic Symptoms:  none PTSD Symptoms: Negative NA  Past Psychiatric History:  No h/o admission No h/o SA    Previous Psychotropic Medications:  Prozac Paxil Effexor- worked best, increased LFT Cymbalta  seroquel xanax  Substance Abuse History in the last 12  months:  Yes.    Social drinker   Consequences of Substance Abuse: Negative NA  Past Medical History:  Past Medical History:  Diagnosis Date  . ADHD (attention deficit hyperactivity disorder)   . Anxiety   . Depression   . Hypertension     Past Surgical History:  Procedure Laterality Date  . ABDOMINAL HYSTERECTOMY  2008  . CARPAL TUNNEL RELEASE    . GASTRIC BYPASS    . TONSILLECTOMY      Family Psychiatric History:  She denied any family history of psychiatric illness   Family History:  Family History  Problem Relation Age of Onset  . Cancer Mother     colon  . Epilepsy Mother   . Glaucoma Father     Social History:   Social History   Social History  . Marital status: Married    Spouse name: N/A  . Number of children: N/A  . Years of education: N/A   Social History Main Topics  . Smoking status: Current Every Day Smoker    Packs/day: 0.50    Years: 14.00    Types: Cigarettes    Start date: 06/11/2000  . Smokeless tobacco: Never Used  . Alcohol use No     Comment: rarely  . Drug use: No  . Sexual activity: Yes    Partners: Male    Birth control/ protection: Surgical   Other Topics Concern  . None   Social History Narrative  . None    Additional Social History:  Married x 17 years for first times.  Has children - 2 , 25  and 70  Now married x 9 years - Has good relationship with husband   Allergies:   Allergies  Allergen Reactions  . Vicodin [Hydrocodone-Acetaminophen] Other (See Comments)    Has really bad headaches/ migraine      Metabolic Disorder Labs: Lab Results  Component Value Date   HGBA1C 6.3 10/24/2011   No results found for: PROLACTIN Lab Results  Component Value Date   CHOL 144 10/29/2014   TRIG 91 10/29/2014   HDL 49 10/29/2014   CHOLHDL 2.9 10/29/2014   VLDL 18 10/29/2014   LDLCALC 77 10/29/2014   LDLCALC 53 07/08/2013     Current Medications: Current Outpatient Prescriptions  Medication Sig Dispense Refill   . albuterol (PROVENTIL HFA;VENTOLIN HFA) 108 (90 Base) MCG/ACT inhaler Inhale 2 puffs into the lungs every 6 (six) hours as needed for wheezing or shortness of breath. 1 Inhaler 0  . ALPRAZolam (XANAX) 0.5 MG tablet Take 1 tablet (0.5 mg total) by mouth at bedtime as needed. 30 tablet 0  . amphetamine-dextroamphetamine (ADDERALL) 10 MG tablet Take 1 tablet (10 mg total) by mouth daily with breakfast. 30 tablet 0  . azithromycin (ZITHROMAX Z-PAK) 250 MG tablet 2 pills today then 1 pill a day for 4 days 6 each 0  . benzonatate (TESSALON) 200 MG capsule Take 1 capsule (200 mg total) by mouth 2 (two) times daily as needed for cough. 20 capsule 0  . cyclobenzaprine (FLEXERIL) 10 MG tablet TAKE 1 TABLET BY MOUTH EVERY 8 HOURS AS NEEDED FOR MUSCLE SPASMS 90 tablet 0  . DULoxetine (CYMBALTA) 30 MG capsule Take 1 capsule (30 mg total) by mouth daily. 30 capsule 1  . lithium carbonate (LITHOBID) 300 MG CR tablet Take 1 tablet (300 mg total) by mouth at bedtime. 30 tablet 1  . nystatin-triamcinolone (MYCOLOG II) cream Reported on 11/15/2015  0  . omeprazole (PRILOSEC) 40 MG capsule Take 1 capsule (40 mg total) by mouth daily. 30 capsule 2  . QUEtiapine (SEROQUEL) 100 MG tablet Take 1 tablet (100 mg total) by mouth at bedtime. 30 tablet 0  . QUEtiapine (SEROQUEL) 25 MG tablet Take 1 tablet (25 mg total) by mouth 2 (two) times daily. 60 tablet 1   Current Facility-Administered Medications  Medication Dose Route Frequency Provider Last Rate Last Dose  . ipratropium-albuterol (DUONEB) 0.5-2.5 (3) MG/3ML nebulizer solution 3 mL  3 mL Nebulization Q6H Versie Starks, PA-C      . lidocaine (PF) (XYLOCAINE) 1 % injection 4 mL  4 mL Intradermal Once Steele Sizer, MD        Neurologic: Headache: No Seizure: No Paresthesias:No  Musculoskeletal: Strength & Muscle Tone: within normal limits Gait & Station: normal Patient leans: N/A  Psychiatric Specialty Exam: Review of Systems  Musculoskeletal: Positive  for joint pain.  Psychiatric/Behavioral: Positive for depression. The patient is nervous/anxious.     Blood pressure 114/78, pulse 87, weight 174 lb (78.9 kg).Body mass index is 28.96 kg/m.  General Appearance: Well Groomed  Eye Contact:  Fair  Speech:  Normal Rate  Volume:  Normal  Mood:  Anxious  Affect:  Congruent  Thought Process:  Coherent  Orientation:  Full (Time, Place, and Person)  Thought Content:  WDL  Suicidal Thoughts:  No  Homicidal Thoughts:  No  Memory:  Immediate;   Fair Recent;   Fair Remote;   Fair  Judgement:  Good  Insight:  Fair  Psychomotor Activity:  Increased  Concentration:  Concentration: Fair and Attention Span: Fair  Recall:  [place]: Fair  Akathisia:  No  Handed:  Right  AIMS (if indicated):    Assets:  Communication Skills Desire for Improvement Physical Health Social Support Transportation  ADL's:  Intact  Cognition: WNL  Sleep:      Treatment Plan Summary: Medication management   Discussed with patient at length about the medications treatment risks benefits and alternatives Continue Cymbalta 30 mg daily. Continue  Seroquel 100 mg by mouth daily at bedtime and she will also takes Seroquel 25 mg by mouth  when necessary for her more symptoms during the daytime. She is not taking Xanax at this time but has supply. I will start her on lithium carbonate 300 mg at bedtime to help with the mood symptoms. Advised patient to decrease the dose of Adderall 5 mg. She agreed with the plan. I have prescribed her Adderall 10 mg at this time. Follow-up in 4 weeks or earlier depending on her symptoms  Advised patient that I will be leaving this office in the end of November and she became more anxious and apprehensive   More than 50% of the time spent in psychoeducation, counseling and coordination of care.    This note was generated in part or whole with voice recognition software. Voice regonition is usually  quite accurate but there are transcription errors that can and very often do occur. I apologize for any typographical errors that were not detected and corrected.    Rainey Pines, MD 11/9/20173:51 PM

## 2016-04-30 ENCOUNTER — Other Ambulatory Visit: Payer: Self-pay | Admitting: Family Medicine

## 2016-04-30 ENCOUNTER — Other Ambulatory Visit: Payer: Self-pay | Admitting: Psychiatry

## 2016-04-30 DIAGNOSIS — F411 Generalized anxiety disorder: Secondary | ICD-10-CM

## 2016-04-30 NOTE — Telephone Encounter (Signed)
Patient requesting refill of Flexeril to [facility].  

## 2016-05-08 ENCOUNTER — Ambulatory Visit: Payer: Self-pay | Admitting: Physician Assistant

## 2016-05-08 ENCOUNTER — Encounter: Payer: Self-pay | Admitting: Physician Assistant

## 2016-05-08 VITALS — BP 120/80 | HR 94 | Temp 98.9°F

## 2016-05-08 DIAGNOSIS — N390 Urinary tract infection, site not specified: Secondary | ICD-10-CM

## 2016-05-08 DIAGNOSIS — R319 Hematuria, unspecified: Secondary | ICD-10-CM

## 2016-05-08 DIAGNOSIS — R300 Dysuria: Secondary | ICD-10-CM

## 2016-05-08 LAB — POCT URINALYSIS DIPSTICK
NITRITE UA: POSITIVE
Spec Grav, UA: 1.025
Urobilinogen, UA: 4
pH, UA: 5

## 2016-05-08 MED ORDER — CIPROFLOXACIN HCL 250 MG PO TABS: 250.0000 mg | ORAL_TABLET | Freq: Two times a day (BID) | ORAL | 0 refills | Status: DC

## 2016-05-08 NOTE — Progress Notes (Signed)
S:  C/o uti sx for 1 days, burning, urgency, frequency, denies vaginal discharge, abdominal pain or flank pain, fever:  Remainder ros neg  O:  Vitals wnl, nad, no cva tenderness, back nontender, lungs c t a,cv rrr, abd soft nontender, bs normal, n/v intact  A: uti  P: cipro 250mg  bid x 7d, increase water intake, add cranberry juice, return if not improving in 2 -3 days, return earlier if worsening, discussed pyelonephritis sx

## 2016-05-15 ENCOUNTER — Other Ambulatory Visit: Payer: Self-pay | Admitting: Psychiatry

## 2016-05-15 DIAGNOSIS — F411 Generalized anxiety disorder: Secondary | ICD-10-CM

## 2016-05-15 NOTE — Telephone Encounter (Signed)
error 

## 2016-05-16 MED ORDER — QUETIAPINE FUMARATE 25 MG PO TABS: 25.0000 mg | ORAL_TABLET | Freq: Two times a day (BID) | ORAL | 1 refills | Status: DC

## 2016-05-16 MED ORDER — AMPHETAMINE-DEXTROAMPHETAMINE 10 MG PO TABS: 10.0000 mg | ORAL_TABLET | Freq: Every day | ORAL | 0 refills | Status: DC

## 2016-05-16 MED ORDER — DULOXETINE HCL 30 MG PO CPEP: 30.0000 mg | ORAL_CAPSULE | Freq: Every day | ORAL | 0 refills | Status: DC

## 2016-05-16 MED ORDER — QUETIAPINE FUMARATE 100 MG PO TABS: 100.0000 mg | ORAL_TABLET | Freq: Every day | ORAL | 0 refills | Status: DC

## 2016-05-16 MED ORDER — ALPRAZOLAM 0.5 MG PO TABS: 0.5000 mg | ORAL_TABLET | Freq: Every evening | ORAL | 0 refills | Status: DC | PRN

## 2016-05-16 NOTE — Telephone Encounter (Signed)
All meds refilled.

## 2016-05-16 NOTE — Telephone Encounter (Signed)
spoke with patient rx is ready for pick up for adderall 10mg  id # [id number] order # [id number]  and xanax. .5mg  id # [id number] order # [id number]

## 2016-05-16 NOTE — Telephone Encounter (Signed)
needs refill to have enough medication to last until her appt in 06-13-16

## 2016-05-17 ENCOUNTER — Ambulatory Visit: Payer: 59 | Admitting: Psychiatry

## 2016-05-21 ENCOUNTER — Ambulatory Visit: Payer: Self-pay | Admitting: Physician Assistant

## 2016-05-21 ENCOUNTER — Encounter: Payer: Self-pay | Admitting: Physician Assistant

## 2016-05-21 VITALS — BP 132/78 | HR 117 | Temp 98.8°F

## 2016-05-21 DIAGNOSIS — K130 Diseases of lips: Secondary | ICD-10-CM

## 2016-05-21 NOTE — Progress Notes (Signed)
S: c/o areas at sides of her mouth being red and irritated, has used nystatin/triamcinolone cream, carmex, listerine, antibiotic ointment, scope, and multiple other otc meds, no relief, feels like its gotten worse, also ?if I would rx adipex for her to lose weight, hx of gastric bypass and now has gained 30lbs  O: vitals wnl, nad, pt keeps licking edges of mouth, areas are red, irritated, no pus or drainage, n/v intact  A: cracked lips  P: vitamin E oil only, stop licking lips, refusal on writing on weight loss meds, pt eats a poor diet and has had gastric bypass, rx weight loss meds are not the answer

## 2016-06-12 ENCOUNTER — Telehealth: Payer: Self-pay | Admitting: Psychiatry

## 2016-06-13 ENCOUNTER — Ambulatory Visit: Payer: 59 | Admitting: Psychiatry

## 2016-06-13 NOTE — Telephone Encounter (Signed)
If she is taking more than amount prescribed, will not be able to get early refill. Please let pt know

## 2016-06-26 ENCOUNTER — Telehealth: Payer: Self-pay | Admitting: Family Medicine

## 2016-06-26 ENCOUNTER — Other Ambulatory Visit: Payer: Self-pay | Admitting: Psychiatry

## 2016-06-26 DIAGNOSIS — F411 Generalized anxiety disorder: Secondary | ICD-10-CM

## 2016-06-26 NOTE — Telephone Encounter (Signed)
Pt informed of prescription being sent to pharmacy and will call back to schedule appointment.

## 2016-06-26 NOTE — Telephone Encounter (Signed)
Patient requesting refill of Flexeril to [facility].  

## 2016-07-04 ENCOUNTER — Encounter: Payer: Self-pay | Admitting: Psychiatry

## 2016-07-04 ENCOUNTER — Ambulatory Visit (INDEPENDENT_AMBULATORY_CARE_PROVIDER_SITE_OTHER): Payer: 59 | Admitting: Psychiatry

## 2016-07-04 VITALS — BP 124/83 | HR 108 | Temp 98.1°F | Wt 182.8 lb

## 2016-07-04 DIAGNOSIS — F3162 Bipolar disorder, current episode mixed, moderate: Secondary | ICD-10-CM | POA: Diagnosis not present

## 2016-07-04 MED ORDER — DULOXETINE HCL 30 MG PO CPEP: 30.0000 mg | ORAL_CAPSULE | Freq: Every day | ORAL | 0 refills | Status: DC

## 2016-07-04 MED ORDER — LITHIUM CARBONATE 600 MG PO CAPS: 600.0000 mg | ORAL_CAPSULE | Freq: Every day | ORAL | 1 refills | Status: DC

## 2016-07-04 MED ORDER — QUETIAPINE FUMARATE 25 MG PO TABS: 25.0000 mg | ORAL_TABLET | Freq: Two times a day (BID) | ORAL | 1 refills | Status: DC

## 2016-07-04 MED ORDER — QUETIAPINE FUMARATE 100 MG PO TABS: 100.0000 mg | ORAL_TABLET | Freq: Every day | ORAL | 0 refills | Status: DC

## 2016-07-04 MED ORDER — CLONAZEPAM 0.5 MG PO TABS: 0.5000 mg | ORAL_TABLET | Freq: Every day | ORAL | 0 refills | Status: DC

## 2016-07-04 NOTE — Progress Notes (Signed)
Psychiatric MD Progress Note   Patient Identification: Jessica [name] MRN:  [id number] Date of Evaluation:  07/04/2016 Referral Source: PCP Chief Complaint:   Chief Complaint    Medication Problem; Depression; Anxiety; Agitation     Visit Diagnosis:    ICD-9-CM ICD-10-CM   1. Bipolar 1 disorder, mixed, moderate (HCC) 296.62 F31.62     History of Present Illness:    Patient is a 51 year old married female with history of mood symptoms presented As a walk-in. She was tearful during the interview. She reported that she ran out of her medications and is unable to perform her job regularly. She reported that she lashes out at her daughter on a regular basis. She reported that she cannot focus concentrate and has increased anxiety. She stated that she does not know how she is doing at this time. We discussed about her medication in detail. She reported that she wants to have her medications adjusted as she does not want to lose her job. Patient reported that she is not sleeping well at this time.  Discussed  about her medications in detail. She currently denied having any suicidal homicidal ideations or plans. She reported that she is not seeing any therapist outside the [facility]  She stated that she does not have any friends and continues to have relationship issues with her daughter.     Associated Signs/Symptoms: Depression Symptoms:  fatigue, anxiety, (Hypo) Manic Symptoms:  Irritable Mood, Labiality of Mood, Anxiety Symptoms:  Excessive Worry, Panic Symptoms, Psychotic Symptoms:  none PTSD Symptoms: Negative NA  Past Psychiatric History:  No h/o admission No h/o SA    Previous Psychotropic Medications:  Prozac Paxil Effexor- worked best, increased LFT Cymbalta  seroquel xanax  Substance Abuse History in the last 12 months:  Yes.    Social drinker   Consequences of Substance Abuse: Negative NA  Past Medical History:  Past Medical History:  Diagnosis Date  . ADHD  (attention deficit hyperactivity disorder)   . Anxiety   . Depression   . Hypertension     Past Surgical History:  Procedure Laterality Date  . ABDOMINAL HYSTERECTOMY  2008  . CARPAL TUNNEL RELEASE    . GASTRIC BYPASS    . TONSILLECTOMY      Family Psychiatric History:  She denied any family history of psychiatric illness   Family History:  Family History  Problem Relation Age of Onset  . Cancer Mother     colon  . Epilepsy Mother   . Glaucoma Father     Social History:   Social History   Social History  . Marital status: Married    Spouse name: N/A  . Number of children: N/A  . Years of education: N/A   Social History Main Topics  . Smoking status: Current Every Day Smoker    Packs/day: 0.50    Years: 14.00    Types: Cigarettes    Start date: 06/11/2000  . Smokeless tobacco: Never Used  . Alcohol use No     Comment: rarely  . Drug use: No  . Sexual activity: Yes    Partners: Male    Birth control/ protection: Surgical   Other Topics Concern  . None   Social History Narrative  . None    Additional Social History:  Married x 17 years for first times.  Has children - 2 , 40 and 23  Now married x 9 years - Has good relationship with husband   Allergies:   Allergies  Allergen Reactions  . Vicodin [Hydrocodone-Acetaminophen] Other (See Comments)    Has really bad headaches/ migraine      Metabolic Disorder Labs: Lab Results  Component Value Date   HGBA1C 6.3 10/24/2011   No results found for: PROLACTIN Lab Results  Component Value Date   CHOL 144 10/29/2014   TRIG 91 10/29/2014   HDL 49 10/29/2014   CHOLHDL 2.9 10/29/2014   VLDL 18 10/29/2014   LDLCALC 77 10/29/2014   LDLCALC 53 07/08/2013     Current Medications: Current Outpatient Prescriptions  Medication Sig Dispense Refill  . albuterol (PROVENTIL HFA;VENTOLIN HFA) 108 (90 Base) MCG/ACT inhaler Inhale 2 puffs into the lungs every 6 (six) hours as needed for wheezing or shortness  of breath. 1 Inhaler 0  . ciprofloxacin (CIPRO) 250 MG tablet Take 1 tablet (250 mg total) by mouth 2 (two) times daily. (Patient not taking: Reported on 05/21/2016) 14 tablet 0  . clonazePAM (KLONOPIN) 0.5 MG tablet Take 1 tablet (0.5 mg total) by mouth at bedtime. 30 tablet 0  . cyclobenzaprine (FLEXERIL) 10 MG tablet TAKE 1 TABLET BY MOUTH EVERY 8 HOURS AS NEEDED FOR MUSCLE SPASMS 30 tablet 0  . DULoxetine (CYMBALTA) 30 MG capsule Take 1 capsule (30 mg total) by mouth daily. 30 capsule 0  . lithium carbonate 600 MG capsule Take 1 capsule (600 mg total) by mouth at bedtime. 30 capsule 1  . nystatin-triamcinolone (MYCOLOG II) cream Reported on 11/15/2015  0  . QUEtiapine (SEROQUEL) 100 MG tablet Take 1 tablet (100 mg total) by mouth at bedtime. 30 tablet 0  . QUEtiapine (SEROQUEL) 25 MG tablet Take 1 tablet (25 mg total) by mouth 2 (two) times daily. 60 tablet 1   Current Facility-Administered Medications  Medication Dose Route Frequency Provider Last Rate Last Dose  . ipratropium-albuterol (DUONEB) 0.5-2.5 (3) MG/3ML nebulizer solution 3 mL  3 mL Nebulization Q6H Versie Starks, PA-C      . lidocaine (PF) (XYLOCAINE) 1 % injection 4 mL  4 mL Intradermal Once Steele Sizer, MD        Neurologic: Headache: No Seizure: No Paresthesias:No  Musculoskeletal: Strength & Muscle Tone: within normal limits Gait & Station: normal Patient leans: N/A  Psychiatric Specialty Exam: Review of Systems  Musculoskeletal: Positive for joint pain.  Psychiatric/Behavioral: Positive for depression. The patient is nervous/anxious.     Blood pressure 124/83, pulse (!) 108, temperature 98.1 F (36.7 C), temperature source Oral, weight 182 lb 12.8 oz (82.9 kg).Body mass index is 30.42 kg/m.  General Appearance: Well Groomed  Eye Contact:  Fair  Speech:  Normal Rate  Volume:  Normal  Mood:  Anxious  Affect:  Depressed and Labile  Thought Process:  Coherent  Orientation:  Full (Time, Place, and Person)   Thought Content:  WDL  Suicidal Thoughts:  No  Homicidal Thoughts:  No  Memory:  Immediate;   Fair Recent;   Fair Remote;   Fair  Judgement:  Good  Insight:  Fair  Psychomotor Activity:  Increased  Concentration:  Concentration: Fair and Attention Span: Fair  Recall:  [employer] of Knowledge:Fair  Language: Fair  Akathisia:  No  Handed:  Right  AIMS (if indicated):    Assets:  Communication Skills Desire for Improvement Physical Health Social Support Transportation  ADL's:  Intact  Cognition: WNL  Sleep:      Treatment Plan Summary: Medication management   Discussed with patient at length about the medications treatment risks benefits and alternatives Continue  Cymbalta 30 mg daily. Continue  Seroquel 100 mg by mouth daily at bedtime and she will also takes Seroquel 25 mg by mouth  when necessary for her more symptoms during the daytime. Discontinue Xanax and I will start her on the Klonopin 0.5 mg by mouth daily at bedtime I will start her on lithium carbonate 600 mg at bedtime to help with the mood symptoms. discontinue Adderall Follow-up in  2 weeks.     More than 50% of the time spent in psychoeducation, counseling and coordination of care.    This note was generated in part or whole with voice recognition software. Voice regonition is usually quite accurate but there are transcription errors that can and very often do occur. I apologize for any typographical errors that were not detected and corrected.    Rainey Pines, MD 1/24/201810:39 AM

## 2016-07-16 ENCOUNTER — Ambulatory Visit: Payer: 59 | Admitting: Psychiatry

## 2016-07-16 ENCOUNTER — Ambulatory Visit: Payer: Self-pay | Admitting: Physician Assistant

## 2016-07-16 ENCOUNTER — Encounter: Payer: Self-pay | Admitting: Physician Assistant

## 2016-07-16 VITALS — BP 120/80 | HR 96 | Temp 98.4°F

## 2016-07-16 DIAGNOSIS — M549 Dorsalgia, unspecified: Secondary | ICD-10-CM

## 2016-07-16 MED ORDER — KETOROLAC TROMETHAMINE 30 MG/ML IJ SOLN
30.0000 mg | Freq: Once | INTRAMUSCULAR | Status: AC
  Administered 2016-07-16: 30 mg via INTRAMUSCULAR

## 2016-07-16 MED ORDER — METHYLPREDNISOLONE 4 MG PO TBPK: ORAL_TABLET | ORAL | 0 refills | Status: DC

## 2016-07-16 MED ORDER — BACLOFEN 10 MG PO TABS: 10.0000 mg | ORAL_TABLET | Freq: Three times a day (TID) | ORAL | 0 refills | Status: DC

## 2016-07-16 NOTE — Progress Notes (Signed)
S:  C/o low back pain for 2 days, no known injury, but did lift some boxes, pain is worse with movement, increased with bending over, denies numbness, tingling, or changes in bowel/urinary habits, similar sx in past, reg md had her on flexeril and she used it all up this weekend;  Using otc meds without relief Remainder ros neg  O:  Vitals wnl, nad, lungs c t a, cv rrr, spine nontender, muscles in lower back spasmed , decreased rom with bending forward,  Neg slr, pt walks without difficulty, no foot drop noted, n/v intact  A: acute back pain, muscle spasms  P: use wet heat followed by ice, stretches, return to clinic if not better in 3 t 5 days, return earlier if worsening, rx meds: medrol dose pack, baclofen, toradol 60mg  IM once

## 2016-07-17 ENCOUNTER — Ambulatory Visit: Payer: 59 | Admitting: Psychiatry

## 2016-07-17 NOTE — Progress Notes (Signed)
Contacted [facility] [employer] [facility] [employer]. Spoke with Davy Pique whom informed me that patient will need to contact office and speak with billing dept before they are able to schedule appt. Notified patient who said she will contact them.

## 2016-07-18 ENCOUNTER — Ambulatory Visit: Payer: 59 | Admitting: Psychiatry

## 2016-08-02 ENCOUNTER — Telehealth: Payer: Self-pay

## 2016-08-02 NOTE — Telephone Encounter (Signed)
pt called left message that she has appt next week but will not have enough medication until her next appt.

## 2016-08-02 NOTE — Telephone Encounter (Signed)
left message on doctor's line to refill the klonopin, cymbalta, seroquel 100mg  and 25mg  and lithium carbonate with no additional refills.

## 2016-08-08 ENCOUNTER — Ambulatory Visit (INDEPENDENT_AMBULATORY_CARE_PROVIDER_SITE_OTHER): Payer: 59 | Admitting: Psychiatry

## 2016-08-08 ENCOUNTER — Ambulatory Visit: Payer: 59 | Admitting: Psychiatry

## 2016-08-08 ENCOUNTER — Encounter: Payer: Self-pay | Admitting: Psychiatry

## 2016-08-08 VITALS — BP 124/84 | HR 97 | Temp 98.9°F | Wt 187.6 lb

## 2016-08-08 DIAGNOSIS — F411 Generalized anxiety disorder: Secondary | ICD-10-CM

## 2016-08-08 DIAGNOSIS — F3162 Bipolar disorder, current episode mixed, moderate: Secondary | ICD-10-CM

## 2016-08-08 MED ORDER — QUETIAPINE FUMARATE 25 MG PO TABS: 25.0000 mg | ORAL_TABLET | Freq: Two times a day (BID) | ORAL | 1 refills | Status: DC

## 2016-08-08 MED ORDER — PHENTERMINE HCL 15 MG PO CAPS
15.0000 mg | ORAL_CAPSULE | ORAL | 1 refills | Status: DC
Start: 2016-08-08 — End: 2016-10-18

## 2016-08-08 MED ORDER — LITHIUM CARBONATE 600 MG PO CAPS: 600.0000 mg | ORAL_CAPSULE | Freq: Every day | ORAL | 1 refills | Status: DC

## 2016-08-08 MED ORDER — DULOXETINE HCL 30 MG PO CPEP: 30.0000 mg | ORAL_CAPSULE | Freq: Every day | ORAL | 0 refills | Status: DC

## 2016-08-08 MED ORDER — CLONAZEPAM 0.5 MG PO TABS: 0.5000 mg | ORAL_TABLET | Freq: Every day | ORAL | 1 refills | Status: DC

## 2016-08-08 MED ORDER — QUETIAPINE FUMARATE 50 MG PO TABS: 50.0000 mg | ORAL_TABLET | Freq: Every day | ORAL | 1 refills | Status: DC

## 2016-08-08 NOTE — Progress Notes (Signed)
Psychiatric MD Progress Note   Patient Identification: Jessica [name] MRN:  [id number] Date of Evaluation:  08/08/2016 Referral Source: PCP Chief Complaint:   Chief Complaint    Follow-up; Medication Refill     Visit Diagnosis:    ICD-9-CM ICD-10-CM   1. Bipolar 1 disorder, mixed, moderate (HCC) 296.62 F31.62   2. Anxiety, generalized 300.02 F41.1     History of Present Illness:    Patient is a 51 year old married female with history of mood symptoms presented for follow-up. She reported that she continues to have mood swings especially during her work. She reported that he wants to apply for " intermittent time off " and she has spoken to her supervisor. She brought the paperwork with her. We discussed about the "intermittent time off " as it will not be possible and she reported that she knows about the same. She reported that she is very tearful during her work and she loses temper quickly. She reported that she was calling her husband in the break room and then her supervisor walked in on her and asked her that she should limit the use of personal telephones. She started crying. She reported that she cries over small things. Patient reported that she is very emotional. Patient reported that she is also gaining weight and she is concerned about her weight gain. She wants to start using the phentermine as she has taken it in the past. She reported that she spends time coloring and watching TV at home. She feels happy when she wakes up in the morning. She stated that she does not have any suicidal homicidal ideations or plans. She denied having any perceptual disturbances.   She is compliant with her medications.   walk-in. She was tearful during the interview. She reported that she ran out of her medications and is unable to perform her job regularly. She reported that she lashes out at her daughter on a regular basis. She reported that she cannot focus concentrate and has increased  anxiety. She stated that she does not know how she is doing at this time. We discussed about her medication in detail. She reported that she wants to have her medications adjusted as she does not want to lose her job. Patient reported that she is not sleeping well at this time.  Discussed with her about starting therapist at the [facility] and she agreed to the plan. She is not seeing any therapist at this time.    Associated Signs/Symptoms: Depression Symptoms:  fatigue, feelings of worthlessness/guilt, difficulty concentrating, anxiety, weight gain, increased appetite, (Hypo) Manic Symptoms:  Impulsivity, Irritable Mood, Labiality of Mood, Anxiety Symptoms:  Excessive Worry, Panic Symptoms, Psychotic Symptoms:  none PTSD Symptoms: Negative NA  Past Psychiatric History:  No h/o admission No h/o SA    Previous Psychotropic Medications:  Prozac Paxil Effexor- worked best, increased LFT Cymbalta  seroquel xanax  Substance Abuse History in the last 12 months:  Yes.    Social drinker   Consequences of Substance Abuse: Negative NA  Past Medical History:  Past Medical History:  Diagnosis Date  . ADHD (attention deficit hyperactivity disorder)   . Anxiety   . Depression   . Hypertension     Past Surgical History:  Procedure Laterality Date  . ABDOMINAL HYSTERECTOMY  2008  . CARPAL TUNNEL RELEASE    . GASTRIC BYPASS    . TONSILLECTOMY      Family Psychiatric History:  She denied any family history of psychiatric illness  Family History:  Family History  Problem Relation Age of Onset  . Cancer Mother     colon  . Epilepsy Mother   . Glaucoma Father     Social History:   Social History   Social History  . Marital status: Married    Spouse name: N/A  . Number of children: N/A  . Years of education: N/A   Social History Main Topics  . Smoking status: Current Every Day Smoker    Packs/day: 0.50    Years: 14.00    Types: Cigarettes    Start date:  06/11/2000  . Smokeless tobacco: Never Used  . Alcohol use No     Comment: rarely  . Drug use: No  . Sexual activity: Yes    Partners: Male    Birth control/ protection: Surgical   Other Topics Concern  . None   Social History Narrative  . None    Additional Social History:  Married x 17 years for first times.  Has children - 2 , 78 and 23  Now married x 9 years - Has good relationship with husband   Allergies:   Allergies  Allergen Reactions  . Vicodin [Hydrocodone-Acetaminophen] Other (See Comments)    Has really bad headaches/ migraine      Metabolic Disorder Labs: Lab Results  Component Value Date   HGBA1C 6.3 10/24/2011   No results found for: PROLACTIN Lab Results  Component Value Date   CHOL 144 10/29/2014   TRIG 91 10/29/2014   HDL 49 10/29/2014   CHOLHDL 2.9 10/29/2014   VLDL 18 10/29/2014   LDLCALC 77 10/29/2014   LDLCALC 53 07/08/2013     Current Medications: Current Outpatient Prescriptions  Medication Sig Dispense Refill  . albuterol (PROVENTIL HFA;VENTOLIN HFA) 108 (90 Base) MCG/ACT inhaler Inhale 2 puffs into the lungs every 6 (six) hours as needed for wheezing or shortness of breath. 1 Inhaler 0  . baclofen (LIORESAL) 10 MG tablet Take 1 tablet (10 mg total) by mouth 3 (three) times daily. 30 each 0  . ciprofloxacin (CIPRO) 250 MG tablet Take 1 tablet (250 mg total) by mouth 2 (two) times daily. 14 tablet 0  . clonazePAM (KLONOPIN) 0.5 MG tablet Take 1 tablet (0.5 mg total) by mouth at bedtime. 30 tablet 1  . DULoxetine (CYMBALTA) 30 MG capsule Take 1 capsule (30 mg total) by mouth daily. 30 capsule 0  . lithium 600 MG capsule Take 1 capsule (600 mg total) by mouth at bedtime. 30 capsule 1  . methylPREDNISolone (MEDROL DOSEPAK) 4 MG TBPK tablet Take 6 pills on day one then decrease by 1 pill each day 21 tablet 0  . nystatin-triamcinolone (MYCOLOG II) cream Reported on 11/15/2015  0  . QUEtiapine (SEROQUEL) 25 MG tablet Take 1 tablet (25 mg total)  by mouth 2 (two) times daily. 60 tablet 1  . phentermine 15 MG capsule Take 1 capsule (15 mg total) by mouth every morning. 30 capsule 1  . QUEtiapine (SEROQUEL) 50 MG tablet Take 1 tablet (50 mg total) by mouth at bedtime. 30 tablet 1   Current Facility-Administered Medications  Medication Dose Route Frequency Provider Last Rate Last Dose  . ipratropium-albuterol (DUONEB) 0.5-2.5 (3) MG/3ML nebulizer solution 3 mL  3 mL Nebulization Q6H Versie Starks, PA-C      . lidocaine (PF) (XYLOCAINE) 1 % injection 4 mL  4 mL Intradermal Once Steele Sizer, MD        Neurologic: Headache: No Seizure: No Paresthesias:No  Musculoskeletal: Strength & Muscle Tone: within normal limits Gait & Station: normal Patient leans: N/A  Psychiatric Specialty Exam: Review of Systems  Musculoskeletal: Positive for joint pain.  Psychiatric/Behavioral: Positive for depression. The patient is nervous/anxious.     Blood pressure 124/84, pulse 97, temperature 98.9 F (37.2 C), temperature source Oral, weight 187 lb 9.6 oz (85.1 kg).Body mass index is 31.22 kg/m.  General Appearance: Well Groomed  Eye Contact:  Fair  Speech:  Normal Rate  Volume:  Normal  Mood:  Anxious  Affect:  Depressed and Labile  Thought Process:  Coherent  Orientation:  Full (Time, Place, and Person)  Thought Content:  WDL  Suicidal Thoughts:  No  Homicidal Thoughts:  No  Memory:  Immediate;   Fair Recent;   Fair Remote;   Fair  Judgement:  Good  Insight:  Fair  Psychomotor Activity:  Increased  Concentration:  Concentration: Fair and Attention Span: Fair  Recall:  [employer] of Knowledge:Fair  Language: Fair  Akathisia:  No  Handed:  Right  AIMS (if indicated):    Assets:  Communication Skills Desire for Improvement Physical Health Social Support Transportation  ADL's:  Intact  Cognition: WNL  Sleep:      Treatment Plan Summary: Medication management   Discussed with patient at length about the medications  treatment risks benefits and alternatives Continue Cymbalta 30 mg daily. Decrease Seroquel 50 mg by mouth daily at bedtime and she will also takes Seroquel 25 mg by mouth  BID when necessary for her mood symptoms during the daytime.  Klonopin 0.5 mg by mouth daily at bedtime I will start her on lithium carbonate 600 mg at bedtime to help with the mood symptoms. I will start her on phentermine 15 mg daily for weight loss. She was adamant about the same. She reported that she is going to have a physical exam done next month is her PCP and they are going to check her EKG .  Follow-up in 4 weeks.     More than 50% of the time spent in psychoeducation, counseling and coordination of care.    This note was generated in part or whole with voice recognition software. Voice regonition is usually quite accurate but there are transcription errors that can and very often do occur. I apologize for any typographical errors that were not detected and corrected.    Rainey Pines, MD 2/28/20183:47 PM

## 2016-08-28 ENCOUNTER — Encounter: Payer: Self-pay | Admitting: Family Medicine

## 2016-08-28 ENCOUNTER — Ambulatory Visit (INDEPENDENT_AMBULATORY_CARE_PROVIDER_SITE_OTHER): Payer: 59 | Admitting: Family Medicine

## 2016-08-28 VITALS — BP 126/68 | HR 111 | Temp 98.4°F | Resp 16 | Ht 65.0 in | Wt 183.4 lb

## 2016-08-28 DIAGNOSIS — E669 Obesity, unspecified: Secondary | ICD-10-CM

## 2016-08-28 DIAGNOSIS — Z79899 Other long term (current) drug therapy: Secondary | ICD-10-CM | POA: Diagnosis not present

## 2016-08-28 DIAGNOSIS — E559 Vitamin D deficiency, unspecified: Secondary | ICD-10-CM

## 2016-08-28 DIAGNOSIS — Z9884 Bariatric surgery status: Secondary | ICD-10-CM | POA: Diagnosis not present

## 2016-08-28 DIAGNOSIS — K130 Diseases of lips: Secondary | ICD-10-CM | POA: Diagnosis not present

## 2016-08-28 DIAGNOSIS — R635 Abnormal weight gain: Secondary | ICD-10-CM | POA: Diagnosis not present

## 2016-08-28 DIAGNOSIS — F3162 Bipolar disorder, current episode mixed, moderate: Secondary | ICD-10-CM

## 2016-08-28 DIAGNOSIS — C8190 Hodgkin lymphoma, unspecified, unspecified site: Secondary | ICD-10-CM | POA: Diagnosis not present

## 2016-08-28 DIAGNOSIS — F5101 Primary insomnia: Secondary | ICD-10-CM | POA: Diagnosis not present

## 2016-08-28 DIAGNOSIS — Z13220 Encounter for screening for lipoid disorders: Secondary | ICD-10-CM

## 2016-08-28 DIAGNOSIS — E538 Deficiency of other specified B group vitamins: Secondary | ICD-10-CM

## 2016-08-28 MED ORDER — NYSTATIN-TRIAMCINOLONE 100000-0.1 UNIT/GM-% EX CREA: TOPICAL_CREAM | Freq: Two times a day (BID) | CUTANEOUS | 0 refills | Status: DC

## 2016-08-28 MED ORDER — MELATONIN 1 MG PO TABS: 1.0000 | ORAL_TABLET | Freq: Every day | ORAL | 0 refills | Status: DC

## 2016-08-28 NOTE — Progress Notes (Signed)
Name: Jessica [name]   MRN: [id number]    DOB: December 03, 1965   Date:08/28/2016       Progress Note  Subjective  Chief Complaint  Chief Complaint  Patient presents with  . Anxiety    pt would like to discuss all medications that she is currently on and also order bloodwork  . Mouth Lesions    in corners of her month keeps coming back regardless of treatments  . Weight Gain    was 165 in 08/17    HPI  Obesity: she had bariatric surgery 03/2011, maximum weight prior to surgery was 270 lbs, she went down to 142 lbs and was stable for many years, however since she was diagnosed with bipolar and has been on antipsychotic medications she has gained a lot of the weight back and is very frustrated. She is on a lower dose of Seroquel now, and is on phentermine but is not helping.   Cheilitis: she is out of medication  Pre-diabetes: last hgbA1C was back in 2013 and it was 6.3%, she has been gaining, no polydipsia , polyuria or polyphagia.   Hodgkin's lymphoma: diagnosed in 2002, not currently seeing hematologist.   Patient Active Problem List   Diagnosis Date Noted  . Bipolar disorder ([place]) 12/02/2015  . HAV (hallux abducto valgus) 11/22/2015  . Bunion of great toe 11/08/2015  . Attention-deficit hyperactivity disorder, other type 10/28/2015  . Sciatica 04/22/2015  . B12 deficiency 12/01/2014  . History of bariatric surgery 12/01/2014  . Anxiety, generalized 12/01/2014  . Gastro-esophageal reflux disease without esophagitis 12/01/2014  . H/O elevated lipids 12/01/2014  . H/O: HTN (hypertension) 12/01/2014  . H/O: obesity 12/01/2014  . Hodgkin's disease in remission ([place]) 12/01/2014  . Current tobacco use 12/01/2014  . Insomnia 03/24/2008  . Abnormal serum level of alkaline phosphatase 11/08/2006  . Chronic recurrent major depressive disorder ([place]) 11/08/2006    Past Surgical History:  Procedure Laterality Date  . ABDOMINAL HYSTERECTOMY  2008  . CARPAL TUNNEL RELEASE    . GASTRIC  BYPASS    . TONSILLECTOMY      Family History  Problem Relation Age of Onset  . Cancer Mother     colon  . Epilepsy Mother   . Glaucoma Father     Social History   Social History  . Marital status: Married    Spouse name: N/A  . Number of children: N/A  . Years of education: N/A   Occupational History  . Not on file.   Social History Main Topics  . Smoking status: Current Every Day Smoker    Packs/day: 0.50    Years: 14.00    Types: Cigarettes    Start date: 06/11/2000  . Smokeless tobacco: Never Used  . Alcohol use No     Comment: rarely  . Drug use: No  . Sexual activity: Yes    Partners: Male    Birth control/ protection: Surgical   Other Topics Concern  . Not on file   Social History Narrative  . No narrative on file     Current Outpatient Prescriptions:  .  clonazePAM (KLONOPIN) 0.5 MG tablet, Take 1 tablet (0.5 mg total) by mouth at bedtime., Disp: 30 tablet, Rfl: 1 .  DULoxetine (CYMBALTA) 30 MG capsule, Take 1 capsule (30 mg total) by mouth daily., Disp: 30 capsule, Rfl: 0 .  lithium 600 MG capsule, Take 1 capsule (600 mg total) by mouth at bedtime., Disp: 30 capsule, Rfl: 1 .  Melatonin 1  MG TABS, Take 1 tablet (1 mg total) by mouth at bedtime., Disp: 30 tablet, Rfl: 0 .  nystatin-triamcinolone (MYCOLOG II) cream, Reported on 11/15/2015, Disp: , Rfl: 0 .  phentermine 15 MG capsule, Take 1 capsule (15 mg total) by mouth every morning., Disp: 30 capsule, Rfl: 1 .  QUEtiapine (SEROQUEL) 50 MG tablet, Take 1 tablet (50 mg total) by mouth at bedtime., Disp: 30 tablet, Rfl: 1  Allergies  Allergen Reactions  . Vicodin [Hydrocodone-Acetaminophen] Other (See Comments)    Has really bad headaches/ migraine       ROS  Constitutional: Negative for fever, positive for weight change.  Respiratory: Negative for cough and shortness of breath.   Cardiovascular: Negative for chest pain or palpitations.  Gastrointestinal: Negative for abdominal pain, no bowel  changes.  Musculoskeletal: Negative for gait problem or joint swelling.  Skin: Negative for rash.  Neurological: Negative for dizziness or headache.  No other specific complaints in a complete review of systems (except as listed in HPI above).  Objective  Vitals:   08/28/16 1033  BP: 126/68  Pulse: (!) 111  Resp: 16  Temp: 98.4 F (36.9 C)  SpO2: 99%  Weight: 183 lb 7 oz (83.2 kg)  Height: 5\' 5"  (1.651 m)    Body mass index is 30.53 kg/m.  Physical Exam  Constitutional: Patient appears well-developed and well-nourished. Obese  No distress.  HEENT: head atraumatic, normocephalic, pupils equal and reactive to light, neck supple, throat within normal limits Cardiovascular: Normal rate, regular rhythm and normal heart sounds.  No murmur heard. No BLE edema. Pulmonary/Chest: Effort normal and breath sounds normal. No respiratory distress. Abdominal: Soft.  There is no tenderness. Skin: cheilitis noted  Psychiatric: Patient is still agitated, pressure speech.   PHQ2/9: Depression screen [facility] 2/9 08/28/2016 12/02/2015 11/08/2015 10/28/2015 06/17/2015  Decreased Interest 0 0 1 1 0  Down, Depressed, Hopeless 0 1 3 3  0  PHQ - 2 Score 0 1 4 4  0  Altered sleeping - 0 2 3 -  Tired, decreased energy - 0 1 3 -  Change in appetite - 0 0 2 -  Feeling bad or failure about yourself  - 1 1 2  -  Trouble concentrating - - 2 3 -  Moving slowly or fidgety/restless - 1 2 3  -  Suicidal thoughts - 0 0 0 -  PHQ-9 Score - 3 12 20  -  Difficult doing work/chores - - Extremely dIfficult Extremely dIfficult -     Fall Risk: Fall Risk  08/28/2016 12/02/2015 11/08/2015 10/28/2015 06/17/2015  Falls in the past year? No No No No No    Functional Status Survey: Is the patient deaf or have difficulty hearing?: No Does the patient have difficulty seeing, even when wearing glasses/contacts?: No Does the patient have difficulty concentrating, remembering, or making decisions?: No Does the patient have difficulty  walking or climbing stairs?: No Does the patient have difficulty dressing or bathing?: No Does the patient have difficulty doing errands alone such as visiting a doctor's office or shopping?: No    Assessment & Plan  1. Bipolar disorder, current episode mixed, moderate (HCC)  Continue follow up with Dr. Sharla Kidney, she is frustrated about her weight gain   2. Primary insomnia  Seeing Dr. Sharla Kidney   3. Angular cheilitis  - nystatin-triamcinolone (MYCOLOG II) cream; Apply topically 2 (two) times daily. Reported on 11/15/2015  Dispense: 30 g; Refill: 0  4. History of bariatric surgery  - CBC with Differential/Platelet  5. B12  deficiency  - Vitamin B12  6. Hodgkin's disease in remission ([place])  -CBC  7. Vitamin D deficiency  - VITAMIN D 25 Hydroxy (Vit-D Deficiency, Fractures)  8. Long-term use of high-risk medication  - COMPLETE METABOLIC PANEL WITH GFR  9. Obesity (BMI 30.0-34.9)  - Hemoglobin A1c - Insulin, fasting  Discussed GLP-1, we will start her on Trulicity if she has insulin resistance. Discussed side effects of medication, no family history of thyroid cancer.  10. Weight gain  - TSH  11. Lipid screening  - Lipid panel

## 2016-08-29 ENCOUNTER — Other Ambulatory Visit
Admission: RE | Admit: 2016-08-29 | Discharge: 2016-08-29 | Disposition: A | Discharge status: Home / Self Care | Payer: 59 | Source: Ambulatory Visit | Attending: Family Medicine | Admitting: Family Medicine

## 2016-08-29 DIAGNOSIS — Z9884 Bariatric surgery status: Secondary | ICD-10-CM | POA: Insufficient documentation

## 2016-08-29 DIAGNOSIS — E669 Obesity, unspecified: Secondary | ICD-10-CM | POA: Insufficient documentation

## 2016-08-29 DIAGNOSIS — E559 Vitamin D deficiency, unspecified: Secondary | ICD-10-CM | POA: Insufficient documentation

## 2016-08-29 DIAGNOSIS — E538 Deficiency of other specified B group vitamins: Secondary | ICD-10-CM | POA: Insufficient documentation

## 2016-08-29 DIAGNOSIS — Z79899 Other long term (current) drug therapy: Secondary | ICD-10-CM | POA: Insufficient documentation

## 2016-08-29 DIAGNOSIS — Z13220 Encounter for screening for lipoid disorders: Secondary | ICD-10-CM | POA: Diagnosis not present

## 2016-08-29 LAB — COMPREHENSIVE METABOLIC PANEL
ALBUMIN: 3.7 g/dL (ref 3.5–5.0)
ALK PHOS: 209 U/L — AB (ref 38–126)
ALT: 18 U/L (ref 14–54)
ANION GAP: 5 (ref 5–15)
AST: 25 U/L (ref 15–41)
BUN: 7 mg/dL (ref 6–20)
CO2: 28 mmol/L (ref 22–32)
Calcium: 9 mg/dL (ref 8.9–10.3)
Chloride: 102 mmol/L (ref 101–111)
Creatinine, Ser: 0.73 mg/dL (ref 0.44–1.00)
GFR calc non Af Amer: >60 mL/min (ref >60)
GLUCOSE: 101 mg/dL — AB (ref 65–99)
POTASSIUM: 4.1 mmol/L (ref 3.5–5.1)
Sodium: 135 mmol/L (ref 135–145)
TOTAL PROTEIN: 7.6 g/dL (ref 6.5–8.1)
Total Bilirubin: 0.7 mg/dL (ref 0.3–1.2)

## 2016-08-29 LAB — CBC WITH DIFFERENTIAL/PLATELET
BASOS PCT: 1 %
Basophils Absolute: 0.1 10*3/uL (ref 0–0.1)
EOS ABS: 0.3 10*3/uL (ref 0–0.7)
Eosinophils Relative: 3 %
HCT: 36.5 % (ref 35.0–47.0)
HEMOGLOBIN: 12.1 g/dL (ref 12.0–16.0)
Lymphocytes Relative: 27 %
Lymphs Abs: 2.3 10*3/uL (ref 1.0–3.6)
MCH: 28.8 pg (ref 26.0–34.0)
MCHC: 33.1 g/dL (ref 32.0–36.0)
MCV: 87 fL (ref 80.0–100.0)
MONOS PCT: 6 %
Monocytes Absolute: 0.5 10*3/uL (ref 0.2–0.9)
NEUTROS PCT: 63 %
Neutro Abs: 5.2 10*3/uL (ref 1.4–6.5)
Platelets: 400 10*3/uL (ref 150–440)
RBC: 4.19 MIL/uL (ref 3.80–5.20)
RDW: 15.2 % — ABNORMAL HIGH (ref 11.5–14.5)
WBC: 8.3 10*3/uL (ref 3.6–11.0)

## 2016-08-29 LAB — LIPID PANEL
CHOLESTEROL: 176 mg/dL (ref 0–200)
HDL: 52 mg/dL (ref >40)
LDL Cholesterol: 104 mg/dL — ABNORMAL HIGH (ref 0–99)
TRIGLYCERIDES: 102 mg/dL (ref <150)
Total CHOL/HDL Ratio: 3.4 RATIO
VLDL: 20 mg/dL (ref 0–40)

## 2016-08-29 LAB — TSH: TSH: 4.988 u[IU]/mL — AB (ref 0.350–4.500)

## 2016-08-29 LAB — VITAMIN B12: VITAMIN B 12: 201 pg/mL (ref 180–914)

## 2016-08-30 ENCOUNTER — Other Ambulatory Visit: Payer: Self-pay | Admitting: Family Medicine

## 2016-08-30 DIAGNOSIS — K130 Diseases of lips: Secondary | ICD-10-CM

## 2016-08-30 DIAGNOSIS — R7989 Other specified abnormal findings of blood chemistry: Secondary | ICD-10-CM | POA: Insufficient documentation

## 2016-08-30 LAB — HEMOGLOBIN A1C
Hgb A1c MFr Bld: 5.3 % (ref 4.8–5.6)
Mean Plasma Glucose: 105 mg/dL

## 2016-08-30 LAB — INSULIN, RANDOM: INSULIN: 4.9 u[IU]/mL (ref 2.6–24.9)

## 2016-08-30 LAB — VITAMIN D 25 HYDROXY (VIT D DEFICIENCY, FRACTURES): VIT D 25 HYDROXY: 27.3 ng/mL — AB (ref 30.0–100.0)

## 2016-08-30 MED ORDER — NYSTATIN-TRIAMCINOLONE 100000-0.1 UNIT/GM-% EX CREA: TOPICAL_CREAM | Freq: Two times a day (BID) | CUTANEOUS | 0 refills | Status: DC

## 2016-08-30 MED ORDER — MELATONIN 1 MG PO TABS: 1.0000 | ORAL_TABLET | Freq: Every day | ORAL | 12 refills | Status: DC

## 2016-08-30 MED ORDER — LEVOTHYROXINE SODIUM 25 MCG PO TABS: 25.0000 ug | ORAL_TABLET | Freq: Every day | ORAL | 1 refills | Status: DC

## 2016-08-31 ENCOUNTER — Encounter: Payer: Self-pay | Admitting: Family Medicine

## 2016-08-31 ENCOUNTER — Other Ambulatory Visit: Payer: Self-pay

## 2016-08-31 ENCOUNTER — Other Ambulatory Visit: Payer: Self-pay | Admitting: Family Medicine

## 2016-08-31 DIAGNOSIS — R7989 Other specified abnormal findings of blood chemistry: Secondary | ICD-10-CM

## 2016-08-31 DIAGNOSIS — E8881 Metabolic syndrome: Secondary | ICD-10-CM

## 2016-08-31 DIAGNOSIS — R7303 Prediabetes: Secondary | ICD-10-CM

## 2016-08-31 MED ORDER — METFORMIN HCL 500 MG PO TABS: 500.0000 mg | ORAL_TABLET | Freq: Two times a day (BID) | ORAL | 0 refills | Status: DC

## 2016-08-31 MED ORDER — DULAGLUTIDE 1.5 MG/0.5ML ~~LOC~~ SOAJ: 1.5000 mg | SUBCUTANEOUS | 2 refills | Status: DC

## 2016-08-31 NOTE — Telephone Encounter (Signed)
Got a fax from this patient's insurance stating that she must try a trial of metformin and fail before they will cover the Trulicity 1.5mg /0.75mL.  Per the request of Dr. Steele Sizer, a new rx for Metformin 500mg  BID, #60 , 0 refills was sent in to [facility] pharmacy.

## 2016-09-04 LAB — T4: T4, Total: 6.3 ug/dL (ref 4.5–12.0)

## 2016-09-04 LAB — THYROGLOBULIN ANTIBODY

## 2016-09-04 LAB — T3: T3, Total: 111 ng/dL (ref 71–180)

## 2016-09-28 ENCOUNTER — Other Ambulatory Visit: Payer: Self-pay | Admitting: Family Medicine

## 2016-09-28 ENCOUNTER — Other Ambulatory Visit: Payer: Self-pay | Admitting: Psychiatry

## 2016-09-28 DIAGNOSIS — R7303 Prediabetes: Secondary | ICD-10-CM

## 2016-10-09 ENCOUNTER — Encounter: Payer: Self-pay | Admitting: Family Medicine

## 2016-10-09 ENCOUNTER — Ambulatory Visit: Payer: Self-pay | Admitting: Family Medicine

## 2016-10-12 ENCOUNTER — Ambulatory Visit: Payer: Self-pay | Admitting: Family Medicine

## 2016-10-18 ENCOUNTER — Encounter: Payer: Self-pay | Admitting: Family Medicine

## 2016-10-18 ENCOUNTER — Ambulatory Visit (INDEPENDENT_AMBULATORY_CARE_PROVIDER_SITE_OTHER): Payer: 59 | Admitting: Family Medicine

## 2016-10-18 VITALS — BP 114/78 | HR 127 | Temp 98.3°F | Resp 16 | Ht 65.0 in | Wt 165.4 lb

## 2016-10-18 DIAGNOSIS — E538 Deficiency of other specified B group vitamins: Secondary | ICD-10-CM

## 2016-10-18 DIAGNOSIS — R7303 Prediabetes: Secondary | ICD-10-CM

## 2016-10-18 DIAGNOSIS — E8881 Metabolic syndrome: Secondary | ICD-10-CM

## 2016-10-18 DIAGNOSIS — Z9884 Bariatric surgery status: Secondary | ICD-10-CM | POA: Diagnosis not present

## 2016-10-18 DIAGNOSIS — E663 Overweight: Secondary | ICD-10-CM

## 2016-10-18 DIAGNOSIS — E038 Other specified hypothyroidism: Secondary | ICD-10-CM | POA: Diagnosis not present

## 2016-10-18 DIAGNOSIS — E88819 Insulin resistance, unspecified: Secondary | ICD-10-CM

## 2016-10-18 MED ORDER — CYANOCOBALAMIN 1000 MCG/ML IJ SOLN: 1000.0000 ug | Freq: Once | INTRAMUSCULAR | 0 refills | Status: AC

## 2016-10-18 MED ORDER — TRULICITY 1.5 MG/0.5ML ~~LOC~~ SOAJ: 1.5000 mg | SUBCUTANEOUS | 2 refills | Status: DC

## 2016-10-18 MED ORDER — METFORMIN HCL 500 MG PO TABS: ORAL_TABLET | ORAL | 1 refills | Status: DC

## 2016-10-18 MED ORDER — CYANOCOBALAMIN 1000 MCG/ML IJ SOLN
1000.0000 ug | Freq: Once | INTRAMUSCULAR | Status: AC
  Administered 2016-10-18: 1000 ug via INTRAMUSCULAR

## 2016-10-18 NOTE — Progress Notes (Signed)
Name: Jessica [name]   MRN: [id number]    DOB: 04/06/1966   Date:10/18/2016       Progress Note  Subjective  Chief Complaint  Chief Complaint  Patient presents with  . B-12    HPI  Obesity: she had bariatric surgery 03/2011, maximum weight prior to surgery was 270 lbs, she went down to 142 lbs and was stable for many years, however since she was diagnosed with bipolar and has been on antipsychotic medications she has gained a lot of the weight back and is very frustrated, now on Levothyroxine and Metformin and has been losing weight again.  Pre-diabetes:hgbA1C was back in 2013 was 6.3%, but down to 5.3 % with weight loss , she was gradually gaining weight after bariatric surgery and we started her on Metformin 08/2016 and also Synthroid for low TSH and weight gain. She has lost 18 lbs since, feeling well, except for fatigue. She has changed diet and states maker her feel full. No polydipsia , polyuria or polyphagia. She would like to try Trulicity also   P82 deficiency: taking SL, but still feels tired.   Low TSH: associated with fatigue, and weight gain, started on Levothyroxine 08/2016 and is tolerating well, still tired, but losing weight and is happy about it.   Patient Active Problem List   Diagnosis Date Noted  . Abnormal TSH 08/30/2016  . Bipolar disorder ([place]) 12/02/2015  . HAV (hallux abducto valgus) 11/22/2015  . Bunion of great toe 11/08/2015  . Attention-deficit hyperactivity disorder, other type 10/28/2015  . Sciatica 04/22/2015  . B12 deficiency 12/01/2014  . History of bariatric surgery 12/01/2014  . Anxiety, generalized 12/01/2014  . Gastro-esophageal reflux disease without esophagitis 12/01/2014  . H/O elevated lipids 12/01/2014  . H/O: HTN (hypertension) 12/01/2014  . H/O: obesity 12/01/2014  . Hodgkin's disease in remission ([place]) 12/01/2014  . Current tobacco use 12/01/2014  . Insomnia 03/24/2008  . Abnormal serum level of alkaline phosphatase 11/08/2006  .  Chronic recurrent major depressive disorder ([place]) 11/08/2006    Past Surgical History:  Procedure Laterality Date  . ABDOMINAL HYSTERECTOMY  2008  . CARPAL TUNNEL RELEASE    . GASTRIC BYPASS    . TONSILLECTOMY      Family History  Problem Relation Age of Onset  . Cancer Mother        colon  . Epilepsy Mother   . Glaucoma Father     Social History   Social History  . Marital status: Married    Spouse name: N/A  . Number of children: N/A  . Years of education: N/A   Occupational History  . Not on file.   Social History Main Topics  . Smoking status: Current Every Day Smoker    Packs/day: 0.50    Years: 14.00    Types: Cigarettes    Start date: 06/11/2000  . Smokeless tobacco: Never Used  . Alcohol use No     Comment: rarely  . Drug use: No  . Sexual activity: Yes    Partners: Male    Birth control/ protection: Surgical   Other Topics Concern  . Not on file   Social History Narrative  . No narrative on file     Current Outpatient Prescriptions:  .  clonazePAM (KLONOPIN) 0.5 MG tablet, Take 1 tablet (0.5 mg total) by mouth at bedtime., Disp: 30 tablet, Rfl: 1 .  DULoxetine (CYMBALTA) 30 MG capsule, Take 1 capsule (30 mg total) by mouth daily., Disp: 30 capsule, Rfl:  0 .  levothyroxine (SYNTHROID, LEVOTHROID) 25 MCG tablet, Take 1 tablet (25 mcg total) by mouth daily before breakfast., Disp: 30 tablet, Rfl: 1 .  lithium 600 MG capsule, Take 1 capsule (600 mg total) by mouth at bedtime., Disp: 30 capsule, Rfl: 1 .  Melatonin 1 MG TABS, Take 1 tablet (1 mg total) by mouth at bedtime., Disp: 30 tablet, Rfl: 12 .  metFORMIN (GLUCOPHAGE) 500 MG tablet, TAKE 1 TABLET BY MOUTH TWO TIMES DAILY WITH A MEAL, Disp: 180 tablet, Rfl: 1 .  nystatin-triamcinolone (MYCOLOG II) cream, Apply topically 2 (two) times daily. Reported on 11/15/2015, Disp: 30 g, Rfl: 0 .  QUEtiapine (SEROQUEL) 50 MG tablet, Take 1 tablet (50 mg total) by mouth at bedtime., Disp: 30 tablet, Rfl: 1 .   TRULICITY 1.5 MG/0.5ML SOPN, Inject 1.5 mg into the skin once a week., Disp: 4 pen, Rfl: 2  Allergies  Allergen Reactions  . Vicodin [Hydrocodone-Acetaminophen] Other (See Comments)    Has really bad headaches/ migraine       ROS  Constitutional: Negative for fever, positive for  weight change.  Respiratory: Negative for cough and shortness of breath.   Cardiovascular: Negative for chest pain or palpitations.  Gastrointestinal: Negative for abdominal pain, no bowel changes.  Musculoskeletal: Negative for gait problem or joint swelling.  Skin: Negative for rash.  Neurological: Negative for dizziness or headache.  No other specific complaints in a complete review of systems (except as listed in HPI above).  Objective  Vitals:   10/18/16 1218  BP: 114/78  Pulse: (!) 127  Resp: 16  Temp: 98.3 F (36.8 C)  SpO2: 97%  Weight: 165 lb 7 oz (75 kg)  Height: 5\' 5"  (1.651 m)    Body mass index is 27.53 kg/m.  Physical Exam  Constitutional: Patient appears well-developed and well-nourished. Overweight  No distress.  HEENT: head atraumatic, normocephalic, pupils equal and reactive to light, neck supple, throat within normal limits Cardiovascular: Normal rate, regular rhythm and normal heart sounds.  No murmur heard. No BLE edema. Pulmonary/Chest: Effort normal and breath sounds normal. No respiratory distress. Abdominal: Soft.  There is no tenderness. Skin: subcutaneous nodule on scalp, pea size, she will see dermatologist, likely a cyst Psychiatric: Patient has a normal mood and affect. behavior is normal. Judgment and thought content normal.  Recent Results (from the past 2160 hour(s))  CBC with Differential/Platelet     Status: Abnormal   Collection Time: 08/29/16  7:45 AM  Result Value Ref Range   WBC 8.3 3.6 - 11.0 K/uL   RBC 4.19 3.80 - 5.20 MIL/uL   Hemoglobin 12.1 12.0 - 16.0 g/dL   HCT 08/31/16 86.7 - 29.2 %   MCV 87.0 80.0 - 100.0 fL   MCH 28.8 26.0 - 34.0 pg   MCHC  33.1 32.0 - 36.0 g/dL   RDW 67.2 (H) 82.0 - 54.1 %   Platelets 400 150 - 440 K/uL   Neutrophils Relative % 63 %   Neutro Abs 5.2 1.4 - 6.5 K/uL   Lymphocytes Relative 27 %   Lymphs Abs 2.3 1.0 - 3.6 K/uL   Monocytes Relative 6 %   Monocytes Absolute 0.5 0.2 - 0.9 K/uL   Eosinophils Relative 3 %   Eosinophils Absolute 0.3 0 - 0.7 K/uL   Basophils Relative 1 %   Basophils Absolute 0.1 0 - 0.1 K/uL  Comprehensive metabolic panel     Status: Abnormal   Collection Time: 08/29/16  7:45 AM  Result  Value Ref Range   Sodium 135 135 - 145 mmol/L   Potassium 4.1 3.5 - 5.1 mmol/L   Chloride 102 101 - 111 mmol/L   CO2 28 22 - 32 mmol/L   Glucose, Bld 101 (H) 65 - 99 mg/dL   BUN 7 6 - 20 mg/dL   Creatinine, Ser 0.73 0.44 - 1.00 mg/dL   Calcium 9.0 8.9 - 10.3 mg/dL   Total Protein 7.6 6.5 - 8.1 g/dL   Albumin 3.7 3.5 - 5.0 g/dL   AST 25 15 - 41 U/L   ALT 18 14 - 54 U/L   Alkaline Phosphatase 209 (H) 38 - 126 U/L   Total Bilirubin 0.7 0.3 - 1.2 mg/dL   GFR calc non Af Amer >60 >60 mL/min   GFR calc Af Amer >60 >60 mL/min    Comment: (NOTE) The eGFR has been calculated using the CKD EPI equation. This calculation has not been validated in all clinical situations. eGFR's persistently <60 mL/min signify possible Chronic Kidney Disease.    Anion gap 5 5 - 15  Hemoglobin A1c     Status: None   Collection Time: 08/29/16  7:45 AM  Result Value Ref Range   Hgb A1c MFr Bld 5.3 4.8 - 5.6 %    Comment: (NOTE)         Pre-diabetes: 5.7 - 6.4         Diabetes: >6.4         Glycemic control for adults with diabetes: <7.0    Mean Plasma Glucose 105 mg/dL    Comment: (NOTE) Performed At: [facility] [address] [place], [place] [id number] Lindon Romp MD FU:[id number]   Insulin, random     Status: None   Collection Time: 08/29/16  7:45 AM  Result Value Ref Range   Insulin 4.9 2.6 - 24.9 uIU/mL    Comment: (NOTE) Performed At: [facility] [address]  [place], [place] [id number] Lindon Romp MD BJ:[id number]   VITAMIN D 25 Hydroxy (Vit-D Deficiency, Fractures)     Status: Abnormal   Collection Time: 08/29/16  7:45 AM  Result Value Ref Range   Vit D, 25-Hydroxy 27.3 (L) 30.0 - 100.0 ng/mL    Comment: (NOTE) Vitamin D deficiency has been defined by the [place] practice guideline as a level of serum 25-OH vitamin D less than 20 ng/mL (1,2). The Endocrine Society went on to further define vitamin D insufficiency as a level between 21 and 29 ng/mL (2). 1. IOM (Institute of Medicine). 2010. Dietary reference   intakes for calcium and D. [place]: The   [employer]. 2. Holick MF, Binkley Rathbun, Bischoff-Ferrari HA, et al.   Evaluation, treatment, and prevention of vitamin D   deficiency: an Endocrine Society clinical practice   guideline. JCEM. 2011 Jul; 96(7):1911-30. Performed At: [facility] [place], [place] [id number] Lindon Romp MD IR:[id number]   Vitamin B12     Status: None   Collection Time: 08/29/16  7:45 AM  Result Value Ref Range   Vitamin B-12 201 180 - 914 pg/mL    Comment: (NOTE) This assay is not validated for testing neonatal or myeloproliferative syndrome specimens for Vitamin B12 levels. Performed at [facility], [place] [address], [place], [place] [address]   TSH     Status: Abnormal   Collection Time: 08/29/16  7:45 AM  Result Value Ref Range   TSH 4.988 (H)  0.350 - 4.500 uIU/mL    Comment: Performed by a 3rd Generation assay with a functional sensitivity of <=0.01 uIU/mL.  Lipid panel     Status: Abnormal   Collection Time: 08/29/16  7:45 AM  Result Value Ref Range   Cholesterol 176 0 - 200 mg/dL   Triglycerides 102 <150 mg/dL   HDL 52 >40 mg/dL   Total CHOL/HDL Ratio 3.4 RATIO   VLDL 20 0 - 40 mg/dL   LDL Cholesterol 104 (H) 0 - 99 mg/dL    Comment:        Total Cholesterol/HDL:CHD Risk Coronary Heart Disease  Risk Table                     Men   Women  1/2 Average Risk   3.4   3.3  Average Risk       5.0   4.4  2 X Average Risk   9.6   7.1  3 X Average Risk  23.4   11.0        Use the calculated Patient Ratio above and the CHD Risk Table to determine the patient's CHD Risk.        ATP III CLASSIFICATION (LDL):  <100     mg/dL   Optimal  100-129  mg/dL   Near or Above                    Optimal  130-159  mg/dL   Borderline  160-189  mg/dL   High  >190     mg/dL   Very High   Thyroglobulin antibody     Status: None   Collection Time: 08/29/16  7:45 AM  Result Value Ref Range   Thyroglobulin Antibody <1.0 0.0 - 0.9 IU/mL    Comment: (NOTE) Thyroglobulin Antibody measured by USAA Methodology Performed At: [facility] [place], [place] [id number] Lindon Romp MD OI:[id number]   T4     Status: None   Collection Time: 08/29/16  7:45 AM  Result Value Ref Range   T4, Total 6.3 4.5 - 12.0 ug/dL    Comment: (NOTE) Performed At: [facility] [place], [place] [id number] Lindon Romp MD KG:[id number]   T3     Status: None   Collection Time: 08/29/16  7:45 AM  Result Value Ref Range   T3, Total 111 71 - 180 ng/dL    Comment: (NOTE) Performed At: [facility] [place], [place] [id number] Lindon Romp MD QK:[id number]     PHQ2/9: Depression screen [facility] 2/9 08/28/2016 12/02/2015 11/08/2015 10/28/2015 06/17/2015  Decreased Interest 0 0 1 1 0  Down, Depressed, Hopeless 0 '1 3 3 '$ 0  PHQ - 2 Score 0 '1 4 4 '$ 0  Altered sleeping - 0 2 3 -  Tired, decreased energy - 0 1 3 -  Change in appetite - 0 0 2 -  Feeling bad or failure about yourself  - '1 1 2 '$ -  Trouble concentrating - - 2 3 -  Moving slowly or fidgety/restless - '1 2 3 '$ -  Suicidal thoughts - 0 0 0 -  PHQ-9 Score - '3 12 20 '$ -  Difficult doing work/chores - - Extremely dIfficult Extremely dIfficult -     Fall Risk: Fall Risk  08/28/2016 12/02/2015  11/08/2015 10/28/2015 06/17/2015  Falls in the past year? No No No No No     Assessment & Plan  1. Overweight  Doing well, lost 18 lbs since she started Metformin - metFORMIN (GLUCOPHAGE) 500 MG tablet; TAKE 1 TABLET BY MOUTH TWO TIMES DAILY WITH A MEAL  Dispense: 180 tablet; Refill: 1 - TRULICITY 1.5 AU/6.3FH SOPN; Inject 1.5 mg into the skin once a week.  Dispense: 4 pen; Refill: 2  2. 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 History of bariatric surgery  She is eating healthier, smaller portions and feels full sooner  4. Insulin resistance  Taking Metformin, but would like extra assistance to help her lose more weight again  - metFORMIN (GLUCOPHAGE) 500 MG tablet; TAKE 1 TABLET BY MOUTH TWO TIMES DAILY WITH A MEAL  Dispense: 180 tablet; Refill: 1 - TRULICITY 1.5 LK/5.6YB SOPN; Inject 1.5 mg into the skin once a week.  Dispense: 4 pen; Refill: 2  5. Other specified hypothyroidism   recheck TSH, started on Synthroid 25 mcg 6 weeks ago  6. Prediabetes  - metFORMIN (GLUCOPHAGE) 500 MG tablet; TAKE 1 TABLET BY MOUTH TWO TIMES DAILY WITH A MEAL  Dispense: 180 tablet; Refill: 1 - TRULICITY 1.5 WL/8.9HT SOPN; Inject 1.5 mg into the skin once a week.  Dispense: 4 pen; Refill: 2

## 2016-10-19 ENCOUNTER — Other Ambulatory Visit: Payer: Self-pay | Admitting: Family Medicine

## 2016-10-20 ENCOUNTER — Other Ambulatory Visit
Admission: RE | Admit: 2016-10-20 | Discharge: 2016-10-20 | Disposition: A | Discharge status: Home / Self Care | Payer: 59 | Source: Ambulatory Visit | Attending: Family Medicine | Admitting: Family Medicine

## 2016-10-20 DIAGNOSIS — R946 Abnormal results of thyroid function studies: Secondary | ICD-10-CM | POA: Insufficient documentation

## 2016-10-21 LAB — T4: T4, Total: 7.4 ug/dL (ref 4.5–12.0)

## 2016-10-21 LAB — T3: T3 TOTAL: 123 ng/dL (ref 71–180)

## 2016-10-22 ENCOUNTER — Other Ambulatory Visit: Payer: Self-pay | Admitting: Family Medicine

## 2016-10-22 ENCOUNTER — Encounter: Payer: Self-pay | Admitting: Family Medicine

## 2016-10-22 LAB — THYROGLOBULIN ANTIBODY: Thyroglobulin Antibody: <1 IU/mL (ref 0.0–0.9)

## 2016-10-22 MED ORDER — CYCLOBENZAPRINE HCL 5 MG PO TABS: 5.0000 mg | ORAL_TABLET | Freq: Three times a day (TID) | ORAL | 0 refills | Status: DC | PRN

## 2016-10-24 ENCOUNTER — Telehealth: Payer: 59 | Admitting: Family

## 2016-10-24 ENCOUNTER — Encounter: Payer: Self-pay | Admitting: Physician Assistant

## 2016-10-24 DIAGNOSIS — M5442 Lumbago with sciatica, left side: Secondary | ICD-10-CM | POA: Diagnosis not present

## 2016-10-24 MED ORDER — BACLOFEN 10 MG PO TABS: 10.0000 mg | ORAL_TABLET | Freq: Three times a day (TID) | ORAL | 0 refills | Status: DC

## 2016-10-24 NOTE — Progress Notes (Signed)

## 2016-10-29 ENCOUNTER — Encounter: Payer: Self-pay | Admitting: Psychiatry

## 2016-10-29 ENCOUNTER — Ambulatory Visit (INDEPENDENT_AMBULATORY_CARE_PROVIDER_SITE_OTHER): Payer: 59 | Admitting: Psychiatry

## 2016-10-29 VITALS — BP 120/78 | HR 92 | Temp 98.5°F | Wt 164.8 lb

## 2016-10-29 DIAGNOSIS — F3162 Bipolar disorder, current episode mixed, moderate: Secondary | ICD-10-CM | POA: Diagnosis not present

## 2016-10-29 DIAGNOSIS — F411 Generalized anxiety disorder: Secondary | ICD-10-CM

## 2016-10-29 MED ORDER — LITHIUM CARBONATE 600 MG PO CAPS: 600.0000 mg | ORAL_CAPSULE | Freq: Every day | ORAL | 1 refills | Status: DC

## 2016-10-29 MED ORDER — QUETIAPINE FUMARATE 50 MG PO TABS: 50.0000 mg | ORAL_TABLET | Freq: Every day | ORAL | 1 refills | Status: DC

## 2016-10-29 MED ORDER — DULOXETINE HCL 30 MG PO CPEP: 30.0000 mg | ORAL_CAPSULE | Freq: Every day | ORAL | 1 refills | Status: DC

## 2016-10-29 MED ORDER — CLONAZEPAM 0.5 MG PO TABS: 0.5000 mg | ORAL_TABLET | Freq: Every day | ORAL | 1 refills | Status: DC

## 2016-10-29 NOTE — Progress Notes (Signed)
Dictation #1 [id number]  [id number]  Psychiatric MD Progress Note   Patient Identification: Jessica [name] MRN:  [id number] Date of Evaluation:  10/29/2016 Referral Source: PCP Chief Complaint:   Chief Complaint    Follow-up; Medication Refill     Visit Diagnosis:    ICD-9-CM ICD-10-CM   1. Bipolar 1 disorder, mixed, moderate (HCC) 296.62 F31.62   2. Anxiety, generalized 300.02 F41.1     History of Present Illness:    Patient is a 51 year old married female with history of mood symptoms presented for follow-up. She reported that she Has started improving and has lost significant amount of weight since she was started on metformin by her primary care physician. Patient is also getting injections of Trulicity. Patient appeared calm and alert during the interview. Patient reported that she was found to be hypothyroid by her primary care physician. She reported that the medication has really helped her. She feels that her mood symptoms are also improving. She is working well at her job. Patient reported that she was found to be having elevated hemoglobin A1c and her primary care physician is helping her and adjust her symptoms. Patient appeared to be at her baseline. She enjoys her job and has been planning to go to the cruise with her husband to celebrate her 16th wedding anniversary. She denied having any suicidal homicidal ideations or plans. We discussed her medications. She is taking Klonopin on a when necessary basis.   She sleeps well at night. She takes Seroquel and melatonin at night to help her sleep.   She is compliant with her medications.      Associated Signs/Symptoms: Depression Symptoms:  fatigue, anxiety, weight gain, increased appetite, (Hypo) Manic Symptoms:  Impulsivity, Irritable Mood, Labiality of Mood, Anxiety Symptoms:  Excessive Worry, Panic Symptoms, Psychotic Symptoms:  none PTSD Symptoms: Negative NA  Past Psychiatric History:  No h/o  admission No h/o SA    Previous Psychotropic Medications:  Prozac Paxil Effexor- worked best, increased LFT Cymbalta  seroquel xanax  Substance Abuse History in the last 12 months:  Yes.    Social drinker   Consequences of Substance Abuse: Negative NA  Past Medical History:  Past Medical History:  Diagnosis Date  . ADHD (attention deficit hyperactivity disorder)   . Anxiety   . Depression   . Hypertension     Past Surgical History:  Procedure Laterality Date  . ABDOMINAL HYSTERECTOMY  2008  . CARPAL TUNNEL RELEASE    . GASTRIC BYPASS    . TONSILLECTOMY      Family Psychiatric History:  She denied any family history of psychiatric illness   Family History:  Family History  Problem Relation Age of Onset  . Cancer Mother        colon  . Epilepsy Mother   . Glaucoma Father     Social History:   Social History   Social History  . Marital status: Married    Spouse name: N/A  . Number of children: N/A  . Years of education: N/A   Social History Main Topics  . Smoking status: Current Every Day Smoker    Packs/day: 0.50    Years: 14.00    Types: Cigarettes    Start date: 06/11/2000  . Smokeless tobacco: Never Used  . Alcohol use No     Comment: rarely  . Drug use: No  . Sexual activity: Yes    Partners: Male    Birth control/ protection: Surgical   Other Topics Concern  .  None   Social History Narrative  . None    Additional Social History:  Married x 17 years for first times.  Has children - 2 , 64 and 23  Now married x 9 years - Has good relationship with husband   Allergies:   Allergies  Allergen Reactions  . Vicodin [Hydrocodone-Acetaminophen] Other (See Comments)    Has really bad headaches/ migraine      Metabolic Disorder Labs: Lab Results  Component Value Date   HGBA1C 5.3 08/29/2016   MPG 105 08/29/2016   No results found for: PROLACTIN Lab Results  Component Value Date   CHOL 176 08/29/2016   TRIG 102 08/29/2016    HDL 52 08/29/2016   CHOLHDL 3.4 08/29/2016   VLDL 20 08/29/2016   LDLCALC 104 (H) 08/29/2016   LDLCALC 77 10/29/2014     Current Medications: Current Outpatient Prescriptions  Medication Sig Dispense Refill  . baclofen (LIORESAL) 10 MG tablet Take 1 tablet (10 mg total) by mouth 3 (three) times daily. 30 each 0  . clonazePAM (KLONOPIN) 0.5 MG tablet Take 1 tablet (0.5 mg total) by mouth at bedtime. 30 tablet 1  . cyclobenzaprine (FLEXERIL) 5 MG tablet Take 1 tablet (5 mg total) by mouth 3 (three) times daily as needed for muscle spasms. 30 tablet 0  . DULoxetine (CYMBALTA) 30 MG capsule Take 1 capsule (30 mg total) by mouth daily. 30 capsule 0  . levothyroxine (SYNTHROID, LEVOTHROID) 25 MCG tablet TAKE 1 TABLET BY MOUTH DAILY BEFORE BREAKFAST. 30 tablet 0  . lithium 600 MG capsule Take 1 capsule (600 mg total) by mouth at bedtime. 30 capsule 1  . Melatonin 1 MG TABS Take 1 tablet (1 mg total) by mouth at bedtime. 30 tablet 12  . metFORMIN (GLUCOPHAGE) 500 MG tablet TAKE 1 TABLET BY MOUTH TWO TIMES DAILY WITH A MEAL 180 tablet 1  . nystatin-triamcinolone (MYCOLOG II) cream Apply topically 2 (two) times daily. Reported on 11/15/2015 30 g 0  . QUEtiapine (SEROQUEL) 50 MG tablet Take 1 tablet (50 mg total) by mouth at bedtime. 30 tablet 1  . TRULICITY 1.5 JJ/0.0XF SOPN Inject 1.5 mg into the skin once a week. 4 pen 2   No current facility-administered medications for this visit.     Neurologic: Headache: No Seizure: No Paresthesias:No  Musculoskeletal: Strength & Muscle Tone: within normal limits Gait & Station: normal Patient leans: N/A  Psychiatric Specialty Exam: Review of Systems  Musculoskeletal: Positive for joint pain.  Psychiatric/Behavioral: Positive for depression. The patient is nervous/anxious.     There were no vitals taken for this visit.There is no height or weight on file to calculate BMI.  General Appearance: Well Groomed  Eye Contact:  Fair  Speech:  Normal  Rate  Volume:  Normal  Mood:  Anxious  Affect:  Depressed and Labile  Thought Process:  Coherent  Orientation:  Full (Time, Place, and Person)  Thought Content:  WDL  Suicidal Thoughts:  No  Homicidal Thoughts:  No  Memory:  Immediate;   Fair Recent;   Fair Remote;   Fair  Judgement:  Good  Insight:  Fair  Psychomotor Activity:  Increased  Concentration:  Concentration: Fair and Attention Span: Fair  Recall:  [employer] of Knowledge:Fair  Language: Fair  Akathisia:  No  Handed:  Right  AIMS (if indicated):    Assets:  Communication Skills Desire for Improvement Physical Health Social Support Transportation  ADL's:  Intact  Cognition: WNL  Sleep:  Treatment Plan Summary: Medication management   Discussed with patient at length about the medications treatment risks benefits and alternatives Continue Cymbalta 30 mg daily. Decrease Seroquel 50 mg by mouth daily at bedtime .  Klonopin 0.5 mg by mouth daily at bedtime On a when necessary basis. She will get a prescription of 15 pills at this time. Continue lithium carbonate 600 mg at bedtime to help with the mood symptoms. Follow-up in 2 months or earlier depending on her symptoms    More than 50% of the time spent in psychoeducation, counseling and coordination of care.    This note was generated in part or whole with voice recognition software. Voice regonition is usually quite accurate but there are transcription errors that can and very often do occur. I apologize for any typographical errors that were not detected and corrected.    Rainey Pines, MD 5/21/20183:08 PM

## 2016-11-23 ENCOUNTER — Other Ambulatory Visit: Payer: Self-pay | Admitting: Family Medicine

## 2016-11-23 NOTE — Telephone Encounter (Signed)
Patient requesting refill of flexeril to synthroid to [facility].

## 2016-12-02 ENCOUNTER — Other Ambulatory Visit: Payer: Self-pay | Admitting: Family Medicine

## 2016-12-04 ENCOUNTER — Other Ambulatory Visit: Payer: Self-pay | Admitting: Family Medicine

## 2016-12-04 DIAGNOSIS — E038 Other specified hypothyroidism: Secondary | ICD-10-CM

## 2016-12-31 ENCOUNTER — Other Ambulatory Visit: Payer: Self-pay | Admitting: Psychiatry

## 2017-01-01 ENCOUNTER — Telehealth: Payer: Self-pay

## 2017-01-01 NOTE — Telephone Encounter (Signed)
pt called states she needs refill on cymbalta

## 2017-01-07 NOTE — Telephone Encounter (Signed)
Will defer to Dr. Gretel Acre, as it is a controlled substance.

## 2017-01-07 NOTE — Telephone Encounter (Signed)
pt called friday and left a voice message that the klonopin is not working pt states that she needs the xanax that it works better.  pt was last seen on  10-29-16 next appt  02-18-17  I was not in the office on Friday.

## 2017-01-07 NOTE — Telephone Encounter (Signed)
Pt need appt before meds   Can be changed. Not seen since May

## 2017-01-18 ENCOUNTER — Ambulatory Visit: Payer: Self-pay | Admitting: Family Medicine

## 2017-01-22 ENCOUNTER — Encounter: Payer: Self-pay | Admitting: Family Medicine

## 2017-01-22 ENCOUNTER — Ambulatory Visit (INDEPENDENT_AMBULATORY_CARE_PROVIDER_SITE_OTHER): Payer: 59 | Admitting: Family Medicine

## 2017-01-22 VITALS — BP 124/78 | HR 88 | Temp 98.7°F | Resp 16 | Ht 66.3 in | Wt 140.0 lb

## 2017-01-22 DIAGNOSIS — M545 Low back pain, unspecified: Secondary | ICD-10-CM

## 2017-01-22 DIAGNOSIS — E663 Overweight: Secondary | ICD-10-CM | POA: Diagnosis not present

## 2017-01-22 DIAGNOSIS — E8881 Metabolic syndrome: Secondary | ICD-10-CM

## 2017-01-22 DIAGNOSIS — Z9884 Bariatric surgery status: Secondary | ICD-10-CM | POA: Diagnosis not present

## 2017-01-22 DIAGNOSIS — E538 Deficiency of other specified B group vitamins: Secondary | ICD-10-CM

## 2017-01-22 DIAGNOSIS — C8190 Hodgkin lymphoma, unspecified, unspecified site: Secondary | ICD-10-CM | POA: Diagnosis not present

## 2017-01-22 DIAGNOSIS — E038 Other specified hypothyroidism: Secondary | ICD-10-CM

## 2017-01-22 DIAGNOSIS — F3162 Bipolar disorder, current episode mixed, moderate: Secondary | ICD-10-CM | POA: Diagnosis not present

## 2017-01-22 DIAGNOSIS — R7303 Prediabetes: Secondary | ICD-10-CM | POA: Diagnosis not present

## 2017-01-22 MED ORDER — TRULICITY 1.5 MG/0.5ML ~~LOC~~ SOAJ: 1.5000 mg | SUBCUTANEOUS | 2 refills | Status: DC

## 2017-01-22 MED ORDER — CYANOCOBALAMIN 1000 MCG/ML IJ SOLN: 1000.0000 ug | Freq: Once | INTRAMUSCULAR | 5 refills | Status: DC

## 2017-01-22 MED ORDER — CYCLOBENZAPRINE HCL 5 MG PO TABS: 5.0000 mg | ORAL_TABLET | Freq: Three times a day (TID) | ORAL | 0 refills | Status: DC | PRN

## 2017-01-22 MED ORDER — MULTI-VITAMIN GUMMIES PO CHEW: 2.0000 | CHEWABLE_TABLET | Freq: Every day | ORAL | 0 refills | Status: AC

## 2017-01-22 MED ORDER — VITAMIN D 50 MCG (2000 UT) PO CAPS: 1.0000 | ORAL_CAPSULE | Freq: Every day | ORAL | 0 refills | Status: DC

## 2017-01-22 NOTE — Progress Notes (Signed)
Name: Jessica [name]   MRN: [id number]    DOB: November 30, 1965   Date:01/22/2017       Progress Note  Subjective  Chief Complaint  Chief Complaint  Patient presents with  . Manic Behavior    3 month follow up  . Obesity    HPI  Obesity: she had bariatric surgery 03/2011, maximum weight prior to surgery was 270 lbs, she went down to 142 lbs and was stable for many years, however since she was diagnosed with bipolar in 2017 and was placed on antipsychotic medications and  gained weight, March 2018 weight was 183 Lbs. She was on Levothyroxine and Metformin and went down to 165 lbs in May 2018 and we started her on Trulicity to help curb appetite and help with pre-diabetes/insulin resistance, and she has lost 25 lbs in the past 3 months. 140 lbs is her goal weight.   Pre-diabetes:hgbA1C was back in 2013 was 6.3%, but down to 5.3 % with weight loss , she was gradually gaining weight after bariatric surgery and we started her on Metformin 08/2016 and also Synthroid for elevated  TSH and weight gain. She is on Trulicity and is doing well, avoiding sweets, no longer drinking sodas, tea or juice. She makes better meal choices and feels like the medication is working well for her.   B12 deficiency: getting monthly B12 and is doing well  Vitamin D deficiency: taking otc supplementation, but too high of a dose, advised to go down from 10000 units daily to 2000 units daily   High TSH: associated with fatigue, and weight gain, started on Levothyroxine 08/2016 and is tolerating well, still tired, but losing weight, we will recheck level.   Hodgkin's lymphoma: treated back in 2000, last WBC was normal, but the one prior to that was high, no night sweats, fever, or lymphadenopathy.   Bipolar: seeing Dr. Maricela Curet, she still has a lot of anxiety, she has been sleeping well, but goes to bed at 8 pm and gets up around 4 am. Tolerating medication well.    Patient Active Problem List   Diagnosis Date Noted  .  Abnormal TSH 08/30/2016  . Bipolar disorder ([place]) 12/02/2015  . HAV (hallux abducto valgus) 11/22/2015  . Bunion of great toe 11/08/2015  . Attention-deficit hyperactivity disorder, other type 10/28/2015  . Sciatica 04/22/2015  . B12 deficiency 12/01/2014  . History of bariatric surgery 12/01/2014  . Anxiety, generalized 12/01/2014  . Gastro-esophageal reflux disease without esophagitis 12/01/2014  . H/O elevated lipids 12/01/2014  . H/O: HTN (hypertension) 12/01/2014  . H/O: obesity 12/01/2014  . Hodgkin's disease in remission ([place]) 12/01/2014  . Current tobacco use 12/01/2014  . Insomnia 03/24/2008  . Abnormal serum level of alkaline phosphatase 11/08/2006  . Chronic recurrent major depressive disorder ([place]) 11/08/2006    Past Surgical History:  Procedure Laterality Date  . ABDOMINAL HYSTERECTOMY  2008  . CARPAL TUNNEL RELEASE    . GASTRIC BYPASS    . TONSILLECTOMY      Family History  Problem Relation Age of Onset  . Cancer Mother        colon  . Epilepsy Mother   . Glaucoma Father     Social History   Social History  . Marital status: Married    Spouse name: N/A  . Number of children: N/A  . Years of education: N/A   Occupational History  . Not on file.   Social History Main Topics  . Smoking status: Current Every Day  Smoker    Packs/day: 0.50    Years: 14.00    Types: Cigarettes    Start date: 06/11/2000  . Smokeless tobacco: Never Used  . Alcohol use No     Comment: rarely  . Drug use: No  . Sexual activity: Yes    Partners: Male    Birth control/ protection: Surgical   Other Topics Concern  . Not on file   Social History Narrative  . No narrative on file     Current Outpatient Prescriptions:  .  clonazePAM (KLONOPIN) 0.5 MG tablet, TAKE ONE TABLET BY MOUTH AT BEDTIME, Disp: 15 tablet, Rfl: 1 .  cyclobenzaprine (FLEXERIL) 5 MG tablet, Take 1 tablet (5 mg total) by mouth 3 (three) times daily as needed. for muscle spams, Disp: 30 tablet, Rfl:  0 .  DULoxetine (CYMBALTA) 30 MG capsule, Take 1 capsule (30 mg total) by mouth daily., Disp: 30 capsule, Rfl: 1 .  levothyroxine (SYNTHROID, LEVOTHROID) 25 MCG tablet, TAKE 1 TABLET BY MOUTH DAILY BEFORE BREAKFAST., Disp: 30 tablet, Rfl: 0 .  lithium 600 MG capsule, Take 1 capsule (600 mg total) by mouth at bedtime., Disp: 30 capsule, Rfl: 1 .  Melatonin 1 MG TABS, Take 1 tablet (1 mg total) by mouth at bedtime., Disp: 30 tablet, Rfl: 12 .  metFORMIN (GLUCOPHAGE) 500 MG tablet, TAKE 1 TABLET BY MOUTH TWO TIMES DAILY WITH A MEAL, Disp: 180 tablet, Rfl: 1 .  nystatin-triamcinolone (MYCOLOG II) cream, Apply topically 2 (two) times daily. Reported on 11/15/2015, Disp: 30 g, Rfl: 0 .  QUEtiapine (SEROQUEL) 50 MG tablet, TAKE 1 TABLET BY MOUTH AT BEDTIME., Disp: 30 tablet, Rfl: 1 .  TRULICITY 1.5 ZO/1.0RU SOPN, Inject 1.5 mg into the skin once a week., Disp: 4 pen, Rfl: 2 .  Cholecalciferol (VITAMIN D) 2000 units CAPS, Take 1 capsule (2,000 Units total) by mouth daily., Disp: 30 capsule, Rfl: 0 .  cyanocobalamin (,VITAMIN B-12,) 1000 MCG/ML injection, Inject 1 mL (1,000 mcg total) into the muscle once., Disp: 1 mL, Rfl: 5 .  Multiple Vitamins-Minerals (MULTI-VITAMIN GUMMIES) CHEW, Chew 2 capsules by mouth daily., Disp: 30 tablet, Rfl: 0  Allergies  Allergen Reactions  . Vicodin [Hydrocodone-Acetaminophen] Other (See Comments)    Has really bad headaches/ migraine       ROS  Constitutional: Negative for fever, positive for  weight change.  Respiratory: Negative for cough and shortness of breath.   Cardiovascular: Negative for chest pain or palpitations.  Gastrointestinal: Negative for abdominal pain, no bowel changes.  Musculoskeletal: Negative for gait problem or joint swelling.  Skin: Negative for rash.  Neurological: Negative for dizziness or headache.  No other specific complaints in a complete review of systems (except as listed in HPI above).  Objective  Vitals:   01/22/17 1534  01/22/17 1553  BP: 124/78   Pulse: (!) 123 88  Resp: 16   Temp: 98.7 F (37.1 C)   SpO2: 95%   Weight: 140 lb (63.5 kg)   Height: 5' 6.3" (1.684 m)     Body mass index is 22.39 kg/m.  Physical Exam  Constitutional: Patient appears well-developed and well-nourished.  No distress.  HEENT: head atraumatic, normocephalic, pupils equal and reactive to light,  neck supple, throat within normal limits Cardiovascular: Normal rate, regular rhythm and normal heart sounds.  No murmur heard. No BLE edema. Pulmonary/Chest: Effort normal and breath sounds normal. No respiratory distress. Abdominal: Soft.  There is no tenderness. Psychiatric: Patient has a normal mood and affect.  Judgment and thought content normal.  PHQ2/9: Depression screen [facility] 2/9 08/28/2016 12/02/2015 11/08/2015 10/28/2015 06/17/2015  Decreased Interest 0 0 1 1 0  Down, Depressed, Hopeless 0 1 3 3  0  PHQ - 2 Score 0 1 4 4  0  Altered sleeping - 0 2 3 -  Tired, decreased energy - 0 1 3 -  Change in appetite - 0 0 2 -  Feeling bad or failure about yourself  - 1 1 2  -  Trouble concentrating - - 2 3 -  Moving slowly or fidgety/restless - 1 2 3  -  Suicidal thoughts - 0 0 0 -  PHQ-9 Score - 3 12 20  -  Difficult doing work/chores - - Extremely dIfficult Extremely dIfficult -     Fall Risk: Fall Risk  08/28/2016 12/02/2015 11/08/2015 10/28/2015 06/17/2015  Falls in the past year? No No No No No     Assessment & Plan  1. Insulin resistance  Doing great - TRULICITY 1.5 PJ/8.2NK SOPN; Inject 1.5 mg into the skin once a week.  Dispense: 4 pen; Refill: 2  2. Other specified hypothyroidism  - TSH  3. Prediabetes  - TRULICITY 1.5 NL/9.7QB SOPN; Inject 1.5 mg into the skin once a week.  Dispense: 4 pen; Refill: 2  4. Bipolar disorder, current episode mixed, moderate ([place])  Continue follow up with Dr. Maricela Curet  5. Hodgkin's disease in remission ([place])  -CBC  6. Overweight (BMI 34.1-93.7)  - TRULICITY 1.5 TK/2.4OX SOPN;  Inject 1.5 mg into the skin once a week.  Dispense: 4 pen; Refill: 2  7. B12 deficiency  - cyanocobalamin (,VITAMIN B-12,) 1000 MCG/ML injection; Inject 1 mL (1,000 mcg total) into the muscle once.  Dispense: 1 mL; Refill: 5  8. History of bariatric surgery  - cyanocobalamin (,VITAMIN B-12,) 1000 MCG/ML injection; Inject 1 mL (1,000 mcg total) into the muscle once.  Dispense: 1 mL; Refill: 5 - Cholecalciferol (VITAMIN D) 2000 units CAPS; Take 1 capsule (2,000 Units total) by mouth daily.  Dispense: 30 capsule; Refill: 0 - Multiple Vitamins-Minerals (MULTI-VITAMIN GUMMIES) CHEW; Chew 2 capsules by mouth daily.  Dispense: 30 tablet; Refill: 0  9. Recurrent low back pain  - cyclobenzaprine (FLEXERIL) 5 MG tablet; Take 1 tablet (5 mg total) by mouth 3 (three) times daily as needed. for muscle spams  Dispense: 30 tablet; Refill: 0

## 2017-01-23 ENCOUNTER — Other Ambulatory Visit: Payer: Self-pay | Admitting: Family Medicine

## 2017-01-23 DIAGNOSIS — M545 Low back pain, unspecified: Secondary | ICD-10-CM

## 2017-01-23 LAB — TSH: TSH: 1.49 m[IU]/L

## 2017-01-23 MED ORDER — LEVOTHYROXINE SODIUM 25 MCG PO TABS: ORAL_TABLET | ORAL | 2 refills | Status: DC

## 2017-01-25 ENCOUNTER — Other Ambulatory Visit: Payer: Self-pay | Admitting: Psychiatry

## 2017-01-29 NOTE — Telephone Encounter (Signed)
Pt has appt for 02-18-17

## 2017-02-17 ENCOUNTER — Telehealth: Payer: 59 | Admitting: Family

## 2017-02-17 DIAGNOSIS — R12 Heartburn: Secondary | ICD-10-CM

## 2017-02-17 NOTE — Progress Notes (Signed)
Based on what you shared with me it looks like you have a serious condition that should be evaluated in a face to face office visit.  NOTE: Even if you have entered your credit card information for this eVisit, you will not be charged.   If you are having a true medical emergency please call 911.  If you need an urgent face to face visit, [facility] has four urgent care centers for your convenience.  If you need care fast and have a high deductible or no insurance consider:   [email or web address]  [phone]  [address], [address] [place], [place] [address] 8 am to 8 pm Monday-Friday 10 am to 4 pm Saturday-Sunday   The following sites will take your  insurance:    . [facility]  [phone] Get Driving Directions Find a Provider at this Location  [address] , [place] [address] . 10 am to 8 pm Monday-Friday . 12 pm to 8 pm Saturday-Sunday   . [facility]  [phone] Get Driving Directions Find a Provider at this Location  [address], [address] [place], [place] [address] . 8 am to 8 pm Monday-Friday . 9 am to 6 pm Saturday . 11 am to 6 pm Sunday   . [facility] [place]  [phone] Get Driving Directions  [address] [place],  [address] . 8 am to 8 pm Monday-Friday . 8 am to 4 pm Saturday-Sunday   Your e-visit answers were reviewed by a board certified advanced clinical practitioner to complete your personal care plan.  Thank you for using e-Visits.  

## 2017-02-18 ENCOUNTER — Emergency Department: Payer: 59

## 2017-02-18 ENCOUNTER — Encounter: Payer: Self-pay | Admitting: *Deleted

## 2017-02-18 ENCOUNTER — Emergency Department
Admission: EM | Admit: 2017-02-18 | Discharge: 2017-02-18 | Disposition: A | Discharge status: Home / Self Care | Payer: 59 | Attending: Emergency Medicine | Admitting: Emergency Medicine

## 2017-02-18 ENCOUNTER — Encounter: Payer: Self-pay | Admitting: Psychiatry

## 2017-02-18 ENCOUNTER — Telehealth: Payer: Self-pay | Admitting: Gastroenterology

## 2017-02-18 ENCOUNTER — Other Ambulatory Visit: Payer: Self-pay

## 2017-02-18 ENCOUNTER — Ambulatory Visit (INDEPENDENT_AMBULATORY_CARE_PROVIDER_SITE_OTHER): Payer: 59 | Admitting: Psychiatry

## 2017-02-18 ENCOUNTER — Encounter: Payer: Self-pay | Admitting: Family Medicine

## 2017-02-18 VITALS — BP 123/82 | HR 121 | Temp 99.3°F | Wt 138.6 lb

## 2017-02-18 DIAGNOSIS — F411 Generalized anxiety disorder: Secondary | ICD-10-CM

## 2017-02-18 DIAGNOSIS — Z8571 Personal history of Hodgkin lymphoma: Secondary | ICD-10-CM | POA: Insufficient documentation

## 2017-02-18 DIAGNOSIS — F509 Eating disorder, unspecified: Secondary | ICD-10-CM | POA: Diagnosis not present

## 2017-02-18 DIAGNOSIS — F39 Unspecified mood [affective] disorder: Secondary | ICD-10-CM | POA: Diagnosis not present

## 2017-02-18 DIAGNOSIS — D3502 Benign neoplasm of left adrenal gland: Secondary | ICD-10-CM | POA: Insufficient documentation

## 2017-02-18 DIAGNOSIS — K279 Peptic ulcer, site unspecified, unspecified as acute or chronic, without hemorrhage or perforation: Secondary | ICD-10-CM | POA: Insufficient documentation

## 2017-02-18 DIAGNOSIS — F17210 Nicotine dependence, cigarettes, uncomplicated: Secondary | ICD-10-CM | POA: Diagnosis not present

## 2017-02-18 DIAGNOSIS — Z9884 Bariatric surgery status: Secondary | ICD-10-CM | POA: Diagnosis not present

## 2017-02-18 DIAGNOSIS — Z7984 Long term (current) use of oral hypoglycemic drugs: Secondary | ICD-10-CM | POA: Diagnosis not present

## 2017-02-18 DIAGNOSIS — I10 Essential (primary) hypertension: Secondary | ICD-10-CM | POA: Insufficient documentation

## 2017-02-18 DIAGNOSIS — R1013 Epigastric pain: Secondary | ICD-10-CM | POA: Diagnosis present

## 2017-02-18 DIAGNOSIS — Z79899 Other long term (current) drug therapy: Secondary | ICD-10-CM | POA: Insufficient documentation

## 2017-02-18 DIAGNOSIS — R109 Unspecified abdominal pain: Secondary | ICD-10-CM | POA: Diagnosis not present

## 2017-02-18 DIAGNOSIS — K289 Gastrojejunal ulcer, unspecified as acute or chronic, without hemorrhage or perforation: Secondary | ICD-10-CM

## 2017-02-18 HISTORY — DX: Malignant (primary) neoplasm, unspecified: C80.1

## 2017-02-18 LAB — CBC
HEMATOCRIT: 36.7 % (ref 35.0–47.0)
Hemoglobin: 12.4 g/dL (ref 12.0–16.0)
MCH: 29.4 pg (ref 26.0–34.0)
MCHC: 33.8 g/dL (ref 32.0–36.0)
MCV: 87 fL (ref 80.0–100.0)
Platelets: 359 10*3/uL (ref 150–440)
RBC: 4.22 MIL/uL (ref 3.80–5.20)
RDW: 15.2 % — AB (ref 11.5–14.5)
WBC: 9.6 10*3/uL (ref 3.6–11.0)

## 2017-02-18 LAB — COMPREHENSIVE METABOLIC PANEL
ALBUMIN: 3.5 g/dL (ref 3.5–5.0)
ALT: 16 U/L (ref 14–54)
AST: 22 U/L (ref 15–41)
Alkaline Phosphatase: 157 U/L — ABNORMAL HIGH (ref 38–126)
Anion gap: 7 (ref 5–15)
BUN: 17 mg/dL (ref 6–20)
CHLORIDE: 102 mmol/L (ref 101–111)
CO2: 28 mmol/L (ref 22–32)
CREATININE: 0.61 mg/dL (ref 0.44–1.00)
Calcium: 9.1 mg/dL (ref 8.9–10.3)
GFR calc Af Amer: >60 mL/min (ref >60)
GLUCOSE: 128 mg/dL — AB (ref 65–99)
POTASSIUM: 4.2 mmol/L (ref 3.5–5.1)
SODIUM: 137 mmol/L (ref 135–145)
Total Bilirubin: 0.3 mg/dL (ref 0.3–1.2)
Total Protein: 7.1 g/dL (ref 6.5–8.1)

## 2017-02-18 LAB — LIPASE, BLOOD: LIPASE: 31 U/L (ref 11–51)

## 2017-02-18 MED ORDER — SUCRALFATE 1 G PO TABS: 1.0000 g | ORAL_TABLET | Freq: Four times a day (QID) | ORAL | 1 refills | Status: DC

## 2017-02-18 MED ORDER — IOPAMIDOL (ISOVUE-300) INJECTION 61%
100.0000 mL | Freq: Once | INTRAVENOUS | Status: AC | PRN
  Administered 2017-02-18: 100 mL via INTRAVENOUS

## 2017-02-18 MED ORDER — SUCRALFATE 1 G PO TABS
1.0000 g | ORAL_TABLET | Freq: Once | ORAL | Status: AC
  Administered 2017-02-18: 1 g via ORAL
  Filled 2017-02-18: qty 1

## 2017-02-18 MED ORDER — PANTOPRAZOLE SODIUM 40 MG IV SOLR
40.0000 mg | Freq: Once | INTRAVENOUS | Status: AC
  Administered 2017-02-18: 40 mg via INTRAVENOUS
  Filled 2017-02-18: qty 40

## 2017-02-18 MED ORDER — CLONAZEPAM 0.5 MG PO TABS: 0.5000 mg | ORAL_TABLET | Freq: Every day | ORAL | 2 refills | Status: DC

## 2017-02-18 MED ORDER — IOPAMIDOL (ISOVUE-300) INJECTION 61%
30.0000 mL | Freq: Once | INTRAVENOUS | Status: AC | PRN
  Administered 2017-02-18: 30 mL via ORAL

## 2017-02-18 MED ORDER — DULOXETINE HCL 30 MG PO CPEP: 30.0000 mg | ORAL_CAPSULE | Freq: Every day | ORAL | 2 refills | Status: DC

## 2017-02-18 MED ORDER — SODIUM CHLORIDE 0.9 % IV SOLN
1000.0000 mL | Freq: Once | INTRAVENOUS | Status: AC
  Administered 2017-02-18: 1000 mL via INTRAVENOUS

## 2017-02-18 MED ORDER — LITHIUM CARBONATE 600 MG PO CAPS: 600.0000 mg | ORAL_CAPSULE | Freq: Every day | ORAL | 2 refills | Status: DC

## 2017-02-18 NOTE — Telephone Encounter (Signed)
Patient was seen in the ED and patient stated that ER doc called Dr. Allen Norris to do a EGD Thursday. Please call patient today.

## 2017-02-18 NOTE — ED Triage Notes (Signed)
Pt states abd burning for several weeks, pt recently stopped metforim and states she has been taking a lot of Tums for the discomfort, last BM Friday, denies any vomiting, hx of gastric bypass

## 2017-02-18 NOTE — Telephone Encounter (Signed)
Pt scheduled for an EGD with [facility] at [facility] on Thursday, Sept 13th. Pt has been advised of instructions and location.

## 2017-02-18 NOTE — Progress Notes (Signed)
Psychiatric MD Progress Note   Patient Identification: Jessica [name] MRN:  [id number] Date of Evaluation:  02/18/2017 Referral Source: PCP Chief Complaint:   Chief Complaint    Follow-up; Medication Refill     Visit Diagnosis:    ICD-10-CM   1. Episodic mood disorder ([place]) F39   2. Anxiety, generalized F41.1   3. Eating disorder, unspecified F50.9     History of Present Illness:    Patient is a 51 -year-old married female with history of mood symptoms presented for follow-up. She reported that she Has lost 57 pounds since September. She reported that she is having severe abdominal pain related to the ulcer. She has history of gastric bypass surgery in the past. She has gained weight related to her psychotropic medications and now she has lost all the weight as she has stopped eating. She reported that she drinks 2- protein  shakes which are 160 calories each as well as eat some crackers during the daytime. She stated that she just saw Dr. Corky Downs and had her CT scan done. She is awaiting call from her GI to have the endoscope done. She stated that she is planning to go her trip with her husband but he wants to have her GI problems taken care of before their trip. Patient reported that she cannot eat due to her abdominal pain.  She stated that she is compliant with her medications and has been taking Klonopin to help her sleep at night. She reported that she can only sleep 5 hours due to her pain.  She denied having any suicidal ideations or plans.       Associated Signs/Symptoms: Depression Symptoms:  anhedonia, insomnia, fatigue, anxiety, (Hypo) Manic Symptoms:  Labiality of Mood, Anxiety Symptoms:  Excessive Worry, Panic Symptoms, Psychotic Symptoms:  none PTSD Symptoms: Negative NA  Past Psychiatric History:  No h/o admission No h/o SA    Previous Psychotropic Medications:  Prozac Paxil Effexor- worked best, increased LFT Cymbalta   seroquel xanax  Substance Abuse History in the last 12 months:  Yes.    Social drinker   Consequences of Substance Abuse: Negative NA  Past Medical History:  Past Medical History:  Diagnosis Date  . ADHD (attention deficit hyperactivity disorder)   . Anxiety   . Cancer ([place])    Hodgkin's Lymphoma 09/1998  . Depression   . Hypertension     Past Surgical History:  Procedure Laterality Date  . ABDOMINAL HYSTERECTOMY  2008  . CARPAL TUNNEL RELEASE    . GASTRIC BYPASS    . TONSILLECTOMY      Family Psychiatric History:  She denied any family history of psychiatric illness   Family History:  Family History  Problem Relation Age of Onset  . Cancer Mother        colon  . Epilepsy Mother   . Glaucoma Father     Social History:   Social History   Social History  . Marital status: Married    Spouse name: N/A  . Number of children: N/A  . Years of education: N/A   Social History Main Topics  . Smoking status: Current Every Day Smoker    Packs/day: 0.50    Years: 14.00    Types: Cigarettes    Start date: 06/11/2000  . Smokeless tobacco: Never Used  . Alcohol use No     Comment: rarely  . Drug use: No  . Sexual activity: Yes    Partners: Male    Birth  control/ protection: Surgical   Other Topics Concern  . None   Social History Narrative  . None    Additional Social History:  Married x 17 years for first times.  Has children - 2 , 31 and 23  Now married x 9 years - Has good relationship with husband   Allergies:   Allergies  Allergen Reactions  . Vicodin [Hydrocodone-Acetaminophen] Other (See Comments)    Has really bad headaches/ migraine      Metabolic Disorder Labs: Lab Results  Component Value Date   HGBA1C 5.3 08/29/2016   MPG 105 08/29/2016   No results found for: PROLACTIN Lab Results  Component Value Date   CHOL 176 08/29/2016   TRIG 102 08/29/2016   HDL 52 08/29/2016   CHOLHDL 3.4 08/29/2016   VLDL 20 08/29/2016   LDLCALC 104  (H) 08/29/2016   LDLCALC 77 10/29/2014     Current Medications: Current Outpatient Prescriptions  Medication Sig Dispense Refill  . Cholecalciferol (VITAMIN D) 2000 units CAPS Take 1 capsule (2,000 Units total) by mouth daily. 30 capsule 0  . clonazePAM (KLONOPIN) 0.5 MG tablet Take 1 tablet (0.5 mg total) by mouth at bedtime. 30 tablet 2  . cyclobenzaprine (FLEXERIL) 5 MG tablet Take 1 tablet (5 mg total) by mouth 3 (three) times daily as needed. for muscle spams 30 tablet 0  . DULoxetine (CYMBALTA) 30 MG capsule Take 1 capsule (30 mg total) by mouth daily. 30 capsule 2  . levothyroxine (SYNTHROID, LEVOTHROID) 25 MCG tablet TAKE 1 TABLET BY MOUTH DAILY BEFORE BREAKFAST. 30 tablet 2  . lithium 600 MG capsule Take 1 capsule (600 mg total) by mouth at bedtime. 30 capsule 2  . Melatonin 1 MG TABS Take 1 tablet (1 mg total) by mouth at bedtime. 30 tablet 12  . metFORMIN (GLUCOPHAGE) 500 MG tablet TAKE 1 TABLET BY MOUTH TWO TIMES DAILY WITH A MEAL 180 tablet 1  . Multiple Vitamins-Minerals (MULTI-VITAMIN GUMMIES) CHEW Chew 2 capsules by mouth daily. 30 tablet 0  . nystatin-triamcinolone (MYCOLOG II) cream Apply topically 2 (two) times daily. Reported on 11/15/2015 30 g 0  . QUEtiapine (SEROQUEL) 50 MG tablet TAKE 1 TABLET BY MOUTH AT BEDTIME. 30 tablet 1  . sucralfate (CARAFATE) 1 g tablet Take 1 tablet (1 g total) by mouth 4 (four) times daily. 120 tablet 1  . TRULICITY 1.5 EV/0.3JK SOPN Inject 1.5 mg into the skin once a week. 4 pen 2   No current facility-administered medications for this visit.     Neurologic: Headache: No Seizure: No Paresthesias:No  Musculoskeletal: Strength & Muscle Tone: within normal limits Gait & Station: normal Patient leans: N/A  Psychiatric Specialty Exam: Review of Systems  Musculoskeletal: Positive for joint pain.  Psychiatric/Behavioral: Positive for depression. The patient is nervous/anxious.     Blood pressure 123/82, pulse (!) 121, temperature 99.3  F (37.4 C), temperature source Oral, weight 138 lb 9.6 oz (62.9 kg).Body mass index is 23.06 kg/m.  General Appearance: Well Groomed  Eye Contact:  Fair  Speech:  Normal Rate  Volume:  Normal  Mood:  Anxious  Affect:  Depressed and Labile  Thought Process:  Coherent  Orientation:  Full (Time, Place, and Person)  Thought Content:  WDL  Suicidal Thoughts:  No  Homicidal Thoughts:  No  Memory:  Immediate;   Fair Recent;   Fair Remote;   Fair  Judgement:  Good  Insight:  Fair  Psychomotor Activity:  Increased  Concentration:  Concentration: Fair and  Attention Span: Fair  Recall:  [place]: Fair  Akathisia:  No  Handed:  Right  AIMS (if indicated):    Assets:  Communication Skills Desire for Improvement Physical Health Social Support Transportation  ADL's:  Intact  Cognition: WNL  Sleep:      Treatment Plan Summary: Medication management   Discussed with patient at length about the medications treatment risks benefits and alternatives  I am suspicious that patient might not be taking her medications as prescribed. She is also not eating enough leading to ulcer and would like to rule out eating disorder due to her past history of gastric bypass. She is focused on getting a prescription of Klonopin to help her sleep at night.  I have refilled her medications as prescribed   Continue Cymbalta 30 mg daily.  Seroquel 50 mg by mouth daily at bedtime .  Klonopin 0.5 mg by mouth daily at bedtime  Continue lithium carbonate 600 mg at bedtime to help with the mood symptoms.  Follow-up in 2 months or earlier depending on her symptoms    More than 50% of the time spent in psychoeducation, counseling and coordination of care.    This note was generated in part or whole with voice recognition software. Voice regonition is usually quite accurate but there are transcription errors that can and very often do occur. I apologize for any typographical  errors that were not detected and corrected.    Rainey Pines, MD 9/10/20183:52 PM

## 2017-02-18 NOTE — ED Provider Notes (Signed)
[facility] Emergency Department Provider Note   ____________________________________________    I have reviewed the triage vital signs and the nursing notes.   HISTORY  Chief Complaint Abdominal Pain     HPI Jessica [name] is a 51 y.o. female Presents with complaints of abdominal pain. Patient reports primarily epigastric abdominal pain over the last several weeks which is been getting worse. She has a history of a Roux-en-Y gastric bypass but this was 6 years ago when she's been doing well since then. She has had gastric ulcers which have felt similar but she reports she is not been tolerating by mouth's over the last week because of the pain. Denies fevers or chills. No diarrhea. No significant vomiting. She has taken omeprazole without any improvement   Past Medical History:  Diagnosis Date  . ADHD (attention deficit hyperactivity disorder)   . Anxiety   . Cancer ([place])    Hodgkin's Lymphoma 09/1998  . Depression   . Hypertension     Patient Active Problem List   Diagnosis Date Noted  . Abnormal TSH 08/30/2016  . Bipolar disorder ([place]) 12/02/2015  . HAV (hallux abducto valgus) 11/22/2015  . Bunion of great toe 11/08/2015  . Attention-deficit hyperactivity disorder, other type 10/28/2015  . Sciatica 04/22/2015  . B12 deficiency 12/01/2014  . History of bariatric surgery 12/01/2014  . Anxiety, generalized 12/01/2014  . Gastro-esophageal reflux disease without esophagitis 12/01/2014  . H/O elevated lipids 12/01/2014  . H/O: HTN (hypertension) 12/01/2014  . H/O: obesity 12/01/2014  . Hodgkin's disease in remission ([place]) 12/01/2014  . Current tobacco use 12/01/2014  . Insomnia 03/24/2008  . Abnormal serum level of alkaline phosphatase 11/08/2006  . Chronic recurrent major depressive disorder ([place]) 11/08/2006    Past Surgical History:  Procedure Laterality Date  . ABDOMINAL HYSTERECTOMY  2008  . CARPAL TUNNEL RELEASE    . GASTRIC  BYPASS    . TONSILLECTOMY      Prior to Admission medications   Medication Sig Start Date End Date Taking? Authorizing Provider  Cholecalciferol (VITAMIN D) 2000 units CAPS Take 1 capsule (2,000 Units total) by mouth daily. 01/22/17  Yes Sowles, Drue Stager, MD  clonazePAM (KLONOPIN) 0.5 MG tablet TAKE ONE TABLET BY MOUTH AT BEDTIME 12/31/16  Yes Rainey Pines, MD  DULoxetine (CYMBALTA) 30 MG capsule Take 1 capsule (30 mg total) by mouth daily. 10/29/16  Yes Rainey Pines, MD  levothyroxine (SYNTHROID, LEVOTHROID) 25 MCG tablet TAKE 1 TABLET BY MOUTH DAILY BEFORE BREAKFAST. 01/23/17  Yes Steele Sizer, MD  lithium 600 MG capsule Take 1 capsule (600 mg total) by mouth at bedtime. 10/29/16  Yes Rainey Pines, MD  Melatonin 1 MG TABS Take 1 tablet (1 mg total) by mouth at bedtime. 08/30/16  Yes Sowles, Drue Stager, MD  metFORMIN (GLUCOPHAGE) 500 MG tablet TAKE 1 TABLET BY MOUTH TWO TIMES DAILY WITH A MEAL 10/18/16  Yes Sowles, Drue Stager, MD  Multiple Vitamins-Minerals (MULTI-VITAMIN GUMMIES) CHEW Chew 2 capsules by mouth daily. 01/22/17  Yes Sowles, Drue Stager, MD  QUEtiapine (SEROQUEL) 50 MG tablet TAKE 1 TABLET BY MOUTH AT BEDTIME. 12/31/16  Yes Rainey Pines, MD  TRULICITY 1.5 NO/7.0JG SOPN Inject 1.5 mg into the skin once a week. 01/22/17  Yes Sowles, Drue Stager, MD  cyclobenzaprine (FLEXERIL) 5 MG tablet Take 1 tablet (5 mg total) by mouth 3 (three) times daily as needed. for muscle spams 01/22/17   Steele Sizer, MD  nystatin-triamcinolone [facility] II) cream Apply topically 2 (two) times daily. Reported on  11/15/2015 08/30/16   Steele Sizer, MD  sucralfate (CARAFATE) 1 g tablet Take 1 tablet (1 g total) by mouth 4 (four) times daily. 02/18/17 02/18/18  Lavonia Drafts, MD     Allergies Vicodin [hydrocodone-acetaminophen]  Family History  Problem Relation Age of Onset  . Cancer Mother        colon  . Epilepsy Mother   . Glaucoma Father     Social History Social History  Substance Use Topics  . Smoking status:  Current Every Day Smoker    Packs/day: 0.50    Years: 14.00    Types: Cigarettes    Start date: 06/11/2000  . Smokeless tobacco: Never Used  . Alcohol use No     Comment: rarely    Review of Systems  Constitutional: No fever/chills Eyes: No visual changes.  ENT: No sore throat. Cardiovascular: Denies chest pain. Respiratory: Denies shortness of breath. Gastrointestinal:as above Genitourinary: Negative for dysuria. Musculoskeletal: Negative for back pain. Skin: Negative for rash. Neurological: Negative for headaches or weakness   ____________________________________________   PHYSICAL EXAM:  VITAL SIGNS: ED Triage Vitals  Enc Vitals Group     BP 02/18/17 0945 130/85     Pulse Rate 02/18/17 0945 (!) 118     Resp 02/18/17 0945 18     Temp 02/18/17 0945 98.2 F (36.8 C)     Temp Source 02/18/17 0945 Oral     SpO2 02/18/17 0945 100 %     Weight 02/18/17 0945 58.5 kg (129 lb)     Height 02/18/17 0945 1.651 m (5\' 5" )     Head Circumference --      Peak Flow --      Pain Score 02/18/17 0944 8     Pain Loc --      Pain Edu? --      Excl. in [place]? --     Constitutional: Alert and oriented. No acute distress. Pleasant and interactive Eyes: Conjunctivae are normal.   Nose: No congestion/rhinnorhea. Mouth/Throat: Mucous membranes are moist.    Cardiovascular: Normal rate, regular rhythm.   Good peripheral circulation. Respiratory: Normal respiratory effort.  No retractions.  Gastrointestinal: mild tenderness to palpation of the epigastrium. No distention.  No CVA tenderness.  Musculoskeletal:   Warm and well perfused Neurologic:  Normal speech and language. No gross focal neurologic deficits are appreciated.  Skin:  Skin is warm, dry and intact. No rash noted. Psychiatric: Mood and affect are normal. Speech and behavior are normal.  ____________________________________________   LABS (all labs ordered are listed, but only abnormal results are displayed)  Labs  Reviewed  COMPREHENSIVE METABOLIC PANEL - Abnormal; Notable for the following:       Result Value   Glucose, Bld 128 (*)    Alkaline Phosphatase 157 (*)    All other components within normal limits  CBC - Abnormal; Notable for the following:    RDW 15.2 (*)    All other components within normal limits  LIPASE, BLOOD  URINALYSIS, COMPLETE (UACMP) WITH MICROSCOPIC   ____________________________________________  EKG  None ____________________________________________  RADIOLOGY  CT abdomen and pelvis show stranding around the GJ junction ____________________________________________   PROCEDURES  Procedure(s) performed: No    Critical Care performed: No ____________________________________________   INITIAL IMPRESSION / ASSESSMENT AND PLAN / ED COURSE  Pertinent labs & imaging results that were available during my care of the patient were reviewed by me and considered in my medical decision making (see chart for details).  Patient presents with  significant epigastric pain,certainlyPUD is a high likelihood however she is quite tender in the epigastrium, does not appear to be centered over the right upper quadrant, we'll obtain CT imaging, check labs give IV Protonix, by mouth Carafate and reevaluate.  CT scan is nonspecific, suspicious for ulcer disease which would fit with her clinical picture. Discussed with Dr. Allen Norris of gastroenterology who will see her in his office this week   ____________________________________________   FINAL CLINICAL IMPRESSION(S) / ED DIAGNOSES  Final diagnoses:  PUD (peptic ulcer disease)      NEW MEDICATIONS STARTED DURING THIS VISIT:  New Prescriptions   SUCRALFATE (CARAFATE) 1 G TABLET    Take 1 tablet (1 g total) by mouth 4 (four) times daily.     Note:  This document was prepared using Dragon voice recognition software and may include unintentional dictation errors.    Lavonia Drafts, MD 02/18/17 1336

## 2017-02-19 ENCOUNTER — Other Ambulatory Visit: Payer: Self-pay | Admitting: Family Medicine

## 2017-02-19 ENCOUNTER — Ambulatory Visit (INDEPENDENT_AMBULATORY_CARE_PROVIDER_SITE_OTHER): Payer: 59 | Admitting: Family Medicine

## 2017-02-19 ENCOUNTER — Encounter: Payer: Self-pay | Admitting: Family Medicine

## 2017-02-19 ENCOUNTER — Ambulatory Visit: Payer: 59 | Admitting: Psychiatry

## 2017-02-19 ENCOUNTER — Encounter: Payer: Self-pay | Admitting: *Deleted

## 2017-02-19 ENCOUNTER — Other Ambulatory Visit: Payer: Self-pay | Admitting: Psychiatry

## 2017-02-19 VITALS — BP 136/84 | HR 111 | Temp 98.5°F | Resp 18 | Wt 133.6 lb

## 2017-02-19 DIAGNOSIS — R590 Localized enlarged lymph nodes: Secondary | ICD-10-CM

## 2017-02-19 DIAGNOSIS — I700 Atherosclerosis of aorta: Secondary | ICD-10-CM | POA: Diagnosis not present

## 2017-02-19 DIAGNOSIS — Z9884 Bariatric surgery status: Secondary | ICD-10-CM | POA: Diagnosis not present

## 2017-02-19 DIAGNOSIS — M545 Low back pain, unspecified: Secondary | ICD-10-CM

## 2017-02-19 DIAGNOSIS — Z23 Encounter for immunization: Secondary | ICD-10-CM | POA: Diagnosis not present

## 2017-02-19 DIAGNOSIS — C8190 Hodgkin lymphoma, unspecified, unspecified site: Secondary | ICD-10-CM | POA: Diagnosis not present

## 2017-02-19 MED ORDER — ATORVASTATIN CALCIUM 40 MG PO TABS: 40.0000 mg | ORAL_TABLET | Freq: Every day | ORAL | 3 refills | Status: DC

## 2017-02-19 NOTE — Progress Notes (Signed)
Name: Jessica [name]   MRN: [id number]    DOB: 09-27-1965   Date:02/19/2017       Progress Note  Subjective  Chief Complaint  Chief Complaint  Patient presents with  . Follow-up    upper endoscopy schedule for Thursday with Wohl MD from CT results     HPI  CT results: she noticed a dull epigastric pain over the past few weeks, but severe burning epigastric pain this past weekend, she went to [facility] and had a CT scan done. She was found to have a possible gastro-jejunal anastomosis ulcer and will see Dr. Durwin Reges for EGD this week. She also was found to have gastrohepatic ligament lymphadenopathy ( personal history of lymphoma) and atherosclerosis of aorta. Also has left adrenal adenoma ( unchanged in side)    Patient Active Problem List   Diagnosis Date Noted  . Adenoma of left adrenal gland 02/18/2017  . Abnormal TSH 08/30/2016  . Bipolar disorder ([place]) 12/02/2015  . HAV (hallux abducto valgus) 11/22/2015  . Bunion of great toe 11/08/2015  . Attention-deficit hyperactivity disorder, other type 10/28/2015  . Sciatica 04/22/2015  . B12 deficiency 12/01/2014  . History of bariatric surgery 12/01/2014  . Anxiety, generalized 12/01/2014  . Gastro-esophageal reflux disease without esophagitis 12/01/2014  . H/O elevated lipids 12/01/2014  . H/O: HTN (hypertension) 12/01/2014  . H/O: obesity 12/01/2014  . Hodgkin's disease in remission ([place]) 12/01/2014  . Current tobacco use 12/01/2014  . Insomnia 03/24/2008  . Abnormal serum level of alkaline phosphatase 11/08/2006  . Chronic recurrent major depressive disorder ([place]) 11/08/2006    Past Surgical History:  Procedure Laterality Date  . ABDOMINAL HYSTERECTOMY  2008  . CARPAL TUNNEL RELEASE    . GASTRIC BYPASS  03/12/2011  . TONSILLECTOMY      Family History  Problem Relation Age of Onset  . Cancer Mother        colon  . Epilepsy Mother   . Glaucoma Father     Social History   Social History  . Marital status: Married   Spouse name: N/A  . Number of children: N/A  . Years of education: N/A   Occupational History  . Not on file.   Social History Main Topics  . Smoking status: Current Every Day Smoker    Packs/day: 0.50    Years: 14.00    Types: Cigarettes    Start date: 06/11/2000  . Smokeless tobacco: Never Used  . Alcohol use No     Comment: rarely  . Drug use: No  . Sexual activity: Yes    Partners: Male    Birth control/ protection: Surgical   Other Topics Concern  . Not on file   Social History Narrative  . No narrative on file     Current Outpatient Prescriptions:  .  Cholecalciferol (VITAMIN D) 2000 units CAPS, Take 1 capsule (2,000 Units total) by mouth daily., Disp: 30 capsule, Rfl: 0 .  clonazePAM (KLONOPIN) 0.5 MG tablet, Take 1 tablet (0.5 mg total) by mouth at bedtime., Disp: 30 tablet, Rfl: 2 .  Cyanocobalamin (VITAMIN B-12 IJ), Inject as directed every 30 (thirty) days., Disp: , Rfl:  .  cyclobenzaprine (FLEXERIL) 5 MG tablet, Take 1 tablet (5 mg total) by mouth 3 (three) times daily as needed. for muscle spams, Disp: 30 tablet, Rfl: 0 .  DULoxetine (CYMBALTA) 30 MG capsule, Take 1 capsule (30 mg total) by mouth daily., Disp: 30 capsule, Rfl: 2 .  levothyroxine (SYNTHROID, LEVOTHROID) 25 MCG tablet,  TAKE 1 TABLET BY MOUTH DAILY BEFORE BREAKFAST., Disp: 30 tablet, Rfl: 2 .  lithium 600 MG capsule, Take 1 capsule (600 mg total) by mouth at bedtime., Disp: 30 capsule, Rfl: 2 .  Melatonin 1 MG TABS, Take 1 tablet (1 mg total) by mouth at bedtime., Disp: 30 tablet, Rfl: 12 .  metFORMIN (GLUCOPHAGE) 500 MG tablet, TAKE 1 TABLET BY MOUTH TWO TIMES DAILY WITH A MEAL, Disp: 180 tablet, Rfl: 1 .  Multiple Vitamins-Minerals (MULTI-VITAMIN GUMMIES) CHEW, Chew 2 capsules by mouth daily., Disp: 30 tablet, Rfl: 0 .  omeprazole (PRILOSEC) 20 MG capsule, Take 20 mg by mouth daily., Disp: , Rfl:  .  QUEtiapine (SEROQUEL) 50 MG tablet, TAKE 1 TABLET BY MOUTH AT BEDTIME., Disp: 30 tablet, Rfl: 1 .   sucralfate (CARAFATE) 1 g tablet, Take 1 tablet (1 g total) by mouth 4 (four) times daily., Disp: 120 tablet, Rfl: 1 .  TRULICITY 1.5 ML/4.6TK SOPN, Inject 1.5 mg into the skin once a week., Disp: 4 pen, Rfl: 2 .  nystatin-triamcinolone (MYCOLOG II) cream, Apply topically 2 (two) times daily. Reported on 11/15/2015 (Patient not taking: Reported on 02/19/2017), Disp: 30 g, Rfl: 0  Allergies  Allergen Reactions  . Vicodin [Hydrocodone-Acetaminophen] Other (See Comments)    Has really bad headaches/ migraine       ROS  Ten systems reviewed and is negative except as mentioned in HPI   Objective  Vitals:   02/19/17 1056  BP: 136/84  Pulse: (!) 111  Resp: 18  Temp: 98.5 F (36.9 C)  TempSrc: Oral  SpO2: 98%  Weight: 133 lb 9.6 oz (60.6 kg)    Body mass index is 22.23 kg/m.  Physical Exam  Constitutional: Patient appears well-developed and well-nourished.  No distress.  HEENT: head atraumatic, normocephalic, pupils equal and reactive to light,  neck supple, throat within normal limits Cardiovascular: Normal rate, regular rhythm and normal heart sounds.  No murmur heard. No BLE edema. Pulmonary/Chest: Effort normal and breath sounds normal. No respiratory distress. Abdominal: Soft.  There is tenderness during palpation of epigastric area, normal bowel sounds. Psychiatric: Patient has a normal mood and affect. behavior is normal. Judgment and thought content normal.  Recent Results (from the past 2160 hour(s))  TSH     Status: None   Collection Time: 01/22/17  4:06 PM  Result Value Ref Range   TSH 1.49 mIU/L    Comment:   Reference Range   > or = 20 Years  0.40-4.50   Pregnancy Range First trimester  0.26-2.66 Second trimester 0.55-2.73 Third trimester  0.43-2.91     Lipase, blood     Status: None   Collection Time: 02/18/17  9:49 AM  Result Value Ref Range   Lipase 31 11 - 51 U/L  Comprehensive metabolic panel     Status: Abnormal   Collection Time: 02/18/17  9:49  AM  Result Value Ref Range   Sodium 137 135 - 145 mmol/L   Potassium 4.2 3.5 - 5.1 mmol/L   Chloride 102 101 - 111 mmol/L   CO2 28 22 - 32 mmol/L   Glucose, Bld 128 (H) 65 - 99 mg/dL   BUN 17 6 - 20 mg/dL   Creatinine, Ser 0.61 0.44 - 1.00 mg/dL   Calcium 9.1 8.9 - 10.3 mg/dL   Total Protein 7.1 6.5 - 8.1 g/dL   Albumin 3.5 3.5 - 5.0 g/dL   AST 22 15 - 41 U/L   ALT 16 14 - 54 U/L  Alkaline Phosphatase 157 (H) 38 - 126 U/L   Total Bilirubin 0.3 0.3 - 1.2 mg/dL   GFR calc non Af Amer >60 >60 mL/min   GFR calc Af Amer >60 >60 mL/min    Comment: (NOTE) The eGFR has been calculated using the CKD EPI equation. This calculation has not been validated in all clinical situations. eGFR's persistently <60 mL/min signify possible Chronic Kidney Disease.    Anion gap 7 5 - 15  CBC     Status: Abnormal   Collection Time: 02/18/17  9:49 AM  Result Value Ref Range   WBC 9.6 3.6 - 11.0 K/uL   RBC 4.22 3.80 - 5.20 MIL/uL   Hemoglobin 12.4 12.0 - 16.0 g/dL   HCT 36.7 35.0 - 47.0 %   MCV 87.0 80.0 - 100.0 fL   MCH 29.4 26.0 - 34.0 pg   MCHC 33.8 32.0 - 36.0 g/dL   RDW 15.2 (H) 11.5 - 14.5 %   Platelets 359 150 - 440 K/uL      PHQ2/9: Depression screen [facility] 2/9 02/19/2017 08/28/2016 12/02/2015 11/08/2015 10/28/2015  Decreased Interest 0 0 0 1 1  Down, Depressed, Hopeless 0 0 '1 3 3  '$ PHQ - 2 Score 0 0 '1 4 4  '$ Altered sleeping - - 0 2 3  Tired, decreased energy - - 0 1 3  Change in appetite - - 0 0 2  Feeling bad or failure about yourself  - - '1 1 2  '$ Trouble concentrating - - - 2 3  Moving slowly or fidgety/restless - - '1 2 3  '$ Suicidal thoughts - - 0 0 0  PHQ-9 Score - - '3 12 20  '$ Difficult doing work/chores - - - Extremely dIfficult Extremely dIfficult     Fall Risk: Fall Risk  02/19/2017 08/28/2016 12/02/2015 11/08/2015 10/28/2015  Falls in the past year? No No No No No    Functional Status Survey: Is the patient deaf or have difficulty hearing?: No Does the patient have difficulty  seeing, even when wearing glasses/contacts?: Yes Does the patient have difficulty concentrating, remembering, or making decisions?: No Does the patient have difficulty walking or climbing stairs?: No Does the patient have difficulty dressing or bathing?: No Does the patient have difficulty doing errands alone such as visiting a doctor's office or shopping?: No   Assessment & Plan  1. Atherosclerosis of abdominal aorta ([place])  Discussed importance of cholesterol management and aspirin, however because of possible gastrojejunal anastomosis, we will only start statin therapy today   2. Needs flu shot  - Flu Vaccine QUAD 6+ mos PF IM (Fluarix Quad PF)  3. History of bariatric surgery  Done by Dr. Duke Salvia, but will follow up with Dr. Durwin Reges for EGD this week  4. Abdominal lymphadenopathy  With a history of lymphoma, we will send films to oncologist and see if they would like further test or follow up  5. Hodgkin's disease in remission ([place])  Diagnosed in 2000, used to go to [facility] but has not been back in a long time. Abnormal CT abdomen that may be secondary to gastrojejunal anastomosis. Last visit was with Dr. Loistine Simas and when she left she lost to follow up. She would like to hold off on referral until a oncologist takes a look at the films

## 2017-02-19 NOTE — Telephone Encounter (Signed)
Patient requesting refill of Flexeril to [facility].

## 2017-02-20 NOTE — Discharge Instructions (Signed)
General Anesthesia, Adult, Care After °These instructions provide you with information about caring for yourself after your procedure. Your health care provider may also give you more specific instructions. Your treatment has been planned according to current medical practices, but problems sometimes occur. Call your health care provider if you have any problems or questions after your procedure. °What can I expect after the procedure? °After the procedure, it is common to have: °· Vomiting. °· A sore throat. °· Mental slowness. ° °It is common to feel: °· Nauseous. °· Cold or shivery. °· Sleepy. °· Tired. °· Sore or achy, even in parts of your body where you did not have surgery. ° °Follow these instructions at home: °For at least 24 hours after the procedure: °· Do not: °? Participate in activities where you could fall or become injured. °? Drive. °? Use heavy machinery. °? Drink alcohol. °? Take sleeping pills or medicines that cause drowsiness. °? Make important decisions or sign legal documents. °? Take care of children on your own. °· Rest. °Eating and drinking °· If you vomit, drink water, juice, or soup when you can drink without vomiting. °· Drink enough fluid to keep your urine clear or pale yellow. °· Make sure you have little or no nausea before eating solid foods. °· Follow the diet recommended by your health care provider. °General instructions °· Have a responsible adult stay with you until you are awake and alert. °· Return to your normal activities as told by your health care provider. Ask your health care provider what activities are safe for you. °· Take over-the-counter and prescription medicines only as told by your health care provider. °· If you smoke, do not smoke without supervision. °· Keep all follow-up visits as told by your health care provider. This is important. °Contact a health care provider if: °· You continue to have nausea or vomiting at home, and medicines are not helpful. °· You  cannot drink fluids or start eating again. °· You cannot urinate after 8-12 hours. °· You develop a skin rash. °· You have fever. °· You have increasing redness at the site of your procedure. °Get help right away if: °· You have difficulty breathing. °· You have chest pain. °· You have unexpected bleeding. °· You feel that you are having a life-threatening or urgent problem. °This information is not intended to replace advice given to you by your health care provider. Make sure you discuss any questions you have with your health care provider. °Document Released: 09/03/2000 Document Revised: 10/31/2015 Document Reviewed: 05/12/2015 °Elsevier Interactive Patient Education © 2018 [employer] Inc. ° °

## 2017-02-21 ENCOUNTER — Ambulatory Visit: Payer: 59 | Admitting: Anesthesiology

## 2017-02-21 ENCOUNTER — Encounter
Admission: RE | Disposition: A | Discharge status: Home / Self Care | Payer: Self-pay | Source: Ambulatory Visit | Attending: Gastroenterology

## 2017-02-21 ENCOUNTER — Ambulatory Visit
Admission: RE | Admit: 2017-02-21 | Discharge: 2017-02-21 | Disposition: A | Discharge status: Home / Self Care | Payer: 59 | Source: Ambulatory Visit | Attending: Gastroenterology | Admitting: Gastroenterology

## 2017-02-21 DIAGNOSIS — Z7984 Long term (current) use of oral hypoglycemic drugs: Secondary | ICD-10-CM | POA: Insufficient documentation

## 2017-02-21 DIAGNOSIS — C8190 Hodgkin lymphoma, unspecified, unspecified site: Secondary | ICD-10-CM | POA: Insufficient documentation

## 2017-02-21 DIAGNOSIS — Z888 Allergy status to other drugs, medicaments and biological substances status: Secondary | ICD-10-CM | POA: Diagnosis not present

## 2017-02-21 DIAGNOSIS — K219 Gastro-esophageal reflux disease without esophagitis: Secondary | ICD-10-CM | POA: Diagnosis not present

## 2017-02-21 DIAGNOSIS — Z79899 Other long term (current) drug therapy: Secondary | ICD-10-CM | POA: Insufficient documentation

## 2017-02-21 DIAGNOSIS — F419 Anxiety disorder, unspecified: Secondary | ICD-10-CM | POA: Insufficient documentation

## 2017-02-21 DIAGNOSIS — K289 Gastrojejunal ulcer, unspecified as acute or chronic, without hemorrhage or perforation: Secondary | ICD-10-CM | POA: Diagnosis not present

## 2017-02-21 DIAGNOSIS — F17210 Nicotine dependence, cigarettes, uncomplicated: Secondary | ICD-10-CM | POA: Insufficient documentation

## 2017-02-21 DIAGNOSIS — I10 Essential (primary) hypertension: Secondary | ICD-10-CM | POA: Insufficient documentation

## 2017-02-21 DIAGNOSIS — Z9884 Bariatric surgery status: Secondary | ICD-10-CM

## 2017-02-21 DIAGNOSIS — F329 Major depressive disorder, single episode, unspecified: Secondary | ICD-10-CM | POA: Diagnosis not present

## 2017-02-21 DIAGNOSIS — R1013 Epigastric pain: Secondary | ICD-10-CM | POA: Diagnosis not present

## 2017-02-21 HISTORY — DX: Gastro-esophageal reflux disease without esophagitis: K21.9

## 2017-02-21 HISTORY — DX: Prediabetes: R73.03

## 2017-02-21 HISTORY — PX: ESOPHAGOGASTRODUODENOSCOPY (EGD) WITH PROPOFOL: SHX5813

## 2017-02-21 SURGERY — ESOPHAGOGASTRODUODENOSCOPY (EGD) WITH PROPOFOL
Anesthesia: General | Site: Esophagus | Wound class: Clean Contaminated

## 2017-02-21 MED ORDER — PROMETHAZINE HCL 25 MG/ML IJ SOLN: 6.2500 mg | INTRAMUSCULAR | Status: DC | PRN

## 2017-02-21 MED ORDER — STERILE WATER FOR IRRIGATION IR SOLN
Status: DC | PRN
  Administered 2017-02-21: 939 mL

## 2017-02-21 MED ORDER — PROPOFOL 10 MG/ML IV BOLUS
INTRAVENOUS | Status: DC | PRN
  Administered 2017-02-21 (×2): 20 mg via INTRAVENOUS
  Administered 2017-02-21: 80 mg via INTRAVENOUS

## 2017-02-21 MED ORDER — LACTATED RINGERS IV SOLN
INTRAVENOUS | Status: DC | PRN
  Administered 2017-02-21: 10:00:00 via INTRAVENOUS

## 2017-02-21 MED ORDER — OXYCODONE HCL 5 MG/5ML PO SOLN: 5.0000 mg | Freq: Once | ORAL | Status: DC | PRN

## 2017-02-21 MED ORDER — FENTANYL CITRATE (PF) 100 MCG/2ML IJ SOLN: 25.0000 ug | INTRAMUSCULAR | Status: DC | PRN

## 2017-02-21 MED ORDER — GLYCOPYRROLATE 0.2 MG/ML IJ SOLN
INTRAMUSCULAR | Status: DC | PRN
  Administered 2017-02-21: 0.2 mg via INTRAVENOUS

## 2017-02-21 MED ORDER — MEPERIDINE HCL 25 MG/ML IJ SOLN: 6.2500 mg | INTRAMUSCULAR | Status: DC | PRN

## 2017-02-21 MED ORDER — LIDOCAINE HCL (CARDIAC) 20 MG/ML IV SOLN
INTRAVENOUS | Status: DC | PRN
  Administered 2017-02-21: 50 mg via INTRAVENOUS

## 2017-02-21 MED ORDER — OXYCODONE HCL 5 MG PO TABS: 5.0000 mg | ORAL_TABLET | Freq: Once | ORAL | Status: DC | PRN

## 2017-02-21 SURGICAL SUPPLY — 32 items
BALLN DILATOR 10-12 8 (BALLOONS)
BALLN DILATOR 12-15 8 (BALLOONS)
BALLN DILATOR 15-18 8 (BALLOONS)
BALLN DILATOR CRE 0-12 8 (BALLOONS)
BALLN DILATOR ESOPH 8 10 CRE (MISCELLANEOUS) IMPLANT
BALLOON DILATOR 12-15 8 (BALLOONS) IMPLANT
BALLOON DILATOR 15-18 8 (BALLOONS) IMPLANT
BALLOON DILATOR CRE 0-12 8 (BALLOONS) IMPLANT
BLOCK BITE 60FR ADLT L/F GRN (MISCELLANEOUS) ×2 IMPLANT
CANISTER SUCT 1200ML W/VALVE (MISCELLANEOUS) ×2 IMPLANT
CLIP HMST 235XBRD CATH ROT (MISCELLANEOUS) IMPLANT
CLIP RESOLUTION 360 11X235 (MISCELLANEOUS)
FCP ESCP3.2XJMB 240X2.8X (MISCELLANEOUS)
FORCEPS BIOP RAD 4 LRG CAP 4 (CUTTING FORCEPS) IMPLANT
FORCEPS BIOP RJ4 240 W/NDL (MISCELLANEOUS)
FORCEPS ESCP3.2XJMB 240X2.8X (MISCELLANEOUS) IMPLANT
GOWN CVR UNV OPN BCK APRN NK (MISCELLANEOUS) ×2 IMPLANT
GOWN ISOL THUMB LOOP REG UNIV (MISCELLANEOUS) ×4
INJECTOR VARIJECT VIN23 (MISCELLANEOUS) IMPLANT
KIT DEFENDO VALVE AND CONN (KITS) IMPLANT
KIT ENDO PROCEDURE OLY (KITS) ×2 IMPLANT
MARKER SPOT ENDO TATTOO 5ML (MISCELLANEOUS) IMPLANT
PAD GROUND ADULT SPLIT (MISCELLANEOUS) IMPLANT
RETRIEVER NET PLAT FOOD (MISCELLANEOUS) IMPLANT
SNARE SHORT THROW 13M SML OVAL (MISCELLANEOUS) IMPLANT
SNARE SHORT THROW 30M LRG OVAL (MISCELLANEOUS) IMPLANT
SPOT EX ENDOSCOPIC TATTOO (MISCELLANEOUS)
SYR INFLATION 60ML (SYRINGE) IMPLANT
TRAP ETRAP POLY (MISCELLANEOUS) IMPLANT
VARIJECT INJECTOR VIN23 (MISCELLANEOUS)
WATER STERILE IRR 250ML POUR (IV SOLUTION) ×2 IMPLANT
WIRE CRE 18-20MM 8CM F G (MISCELLANEOUS) IMPLANT

## 2017-02-21 NOTE — Anesthesia Procedure Notes (Signed)
Performed by: Akansha Wyche Pre-anesthesia Checklist: Patient identified, Emergency Drugs available, Suction available, Timeout performed and Patient being monitored Patient Re-evaluated:Patient Re-evaluated prior to induction Oxygen Delivery Method: Nasal cannula Placement Confirmation: positive ETCO2       

## 2017-02-21 NOTE — Op Note (Signed)
[facility] Patient Name: Jessica [name] Procedure Date: 02/21/2017 9:24 AM MRN: [id number] Account #: [id number] Date of Birth: [birth year] Admit Type: Outpatient Age: 51 Room: [facility] OR ROOM 01 Gender: Female Note Status: Finalized Procedure:            Upper GI endoscopy Indications:          Epigastric abdominal pain Providers:            Lucilla Lame MD, MD Referring MD:         Bethena Roys. Sowles, MD (Referring MD) Medicines:            Propofol per Anesthesia Complications:        No immediate complications. Procedure:            Pre-Anesthesia Assessment:                       - Prior to the procedure, a History and Physical was                        performed, and patient medications and allergies were                        reviewed. The patient's tolerance of previous                        anesthesia was also reviewed. The risks and benefits of                        the procedure and the sedation options and risks were                        discussed with the patient. All questions were                        answered, and informed consent was obtained. Prior                        Anticoagulants: The patient has taken no previous                        anticoagulant or antiplatelet agents. ASA Grade                        Assessment: II - A patient with mild systemic disease.                        After reviewing the risks and benefits, the patient was                        deemed in satisfactory condition to undergo the                        procedure.                       After obtaining informed consent, the endoscope was                        passed under direct vision. Throughout the procedure,  the patient's blood pressure, pulse, and oxygen                        saturations were monitored continuously. The Olympus                        [id number]-HQ190 Endoscope (S#. [id number]) was introduced        through the mouth, and advanced to the jejunum. The                        upper GI endoscopy was accomplished without difficulty.                        The patient tolerated the procedure well. Findings:      The examined esophagus was normal.      Evidence of a gastric bypass was found. A gastric pouch with a normal       size was found. The staple line appeared intact. This was traversed.      One non-bleeding cratered ulcer with no stigmata of bleeding was found       in the jejunum and distal to the gastrojejunal anastomosis. Impression:           - Normal esophagus.                       - Gastric bypass with a normal-sized pouch and intact                        staple line.                       - One non-bleeding jejunal ulcer with no stigmata of                        bleeding.                       - No specimens collected. Recommendation:       - Discharge patient to home.                       - Resume previous diet.                       - Continue present medications. Procedure Code(s):    --- Professional ---                       [phone], Esophagogastroduodenoscopy, flexible, transoral;                        diagnostic, including collection of specimen(s) by                        brushing or washing, when performed (separate procedure) Diagnosis Code(s):    --- Professional ---                       R10.13, Epigastric pain                       Z98.84, Bariatric surgery status  K28.9, Gastrojejunal ulcer, unspecified as acute or                        chronic, without hemorrhage or perforation CPT copyright 2016 American Medical Association. All rights reserved. The codes documented in this report are preliminary and upon coder review may  be revised to meet current compliance requirements. Lucilla Lame MD, MD 02/21/2017 9:42:36 AM This report has been signed electronically. Number of Addenda: 0 Note Initiated On: 02/21/2017 9:24 AM       [facility]

## 2017-02-21 NOTE — Transfer of Care (Signed)
Immediate Anesthesia Transfer of Care Note  Patient: Jessica [name]  Procedure(s) Performed: Procedure(s) with comments: ESOPHAGOGASTRODUODENOSCOPY (EGD) WITH PROPOFOL (N/A) - Pre-diabetic  Patient Location: PACU  Anesthesia Type: General  Level of Consciousness: awake, alert  and patient cooperative  Airway and Oxygen Therapy: Patient Spontanous Breathing and Patient connected to supplemental oxygen  Post-op Assessment: Post-op Vital signs reviewed, Patient's Cardiovascular Status Stable, Respiratory Function Stable, Patent Airway and No signs of Nausea or vomiting  Post-op Vital Signs: Reviewed and stable  Complications: No apparent anesthesia complications

## 2017-02-21 NOTE — Anesthesia Postprocedure Evaluation (Signed)
Anesthesia Post Note  Patient: Jessica [name]  Procedure(s) Performed: Procedure(s) (LRB): ESOPHAGOGASTRODUODENOSCOPY (EGD) WITH PROPOFOL (N/A)  Patient location during evaluation: PACU Anesthesia Type: General Level of consciousness: awake and alert Pain management: pain level controlled Vital Signs Assessment: post-procedure vital signs reviewed and stable Respiratory status: spontaneous breathing, nonlabored ventilation, respiratory function stable and patient connected to nasal cannula oxygen Cardiovascular status: blood pressure returned to baseline and stable Postop Assessment: no signs of nausea or vomiting Anesthetic complications: no    Eduardo Honor ELAINE

## 2017-02-21 NOTE — Anesthesia Preprocedure Evaluation (Signed)
Anesthesia Evaluation  Patient identified by MRN, date of birth, ID band Patient awake    Reviewed: Allergy & Precautions, H&P , NPO status , Patient's Chart, lab work & pertinent test results, reviewed documented beta blocker date and time   Airway Mallampati: II  TM Distance: >3 FB Neck ROM: full    Dental no notable dental hx.    Pulmonary Current Smoker,    Pulmonary exam normal breath sounds clear to auscultation       Cardiovascular Exercise Tolerance: Good hypertension,  Rhythm:regular Rate:Normal     Neuro/Psych PSYCHIATRIC DISORDERS negative neurological ROS     GI/Hepatic Neg liver ROS, GERD  ,  Endo/Other  negative endocrine ROS  Renal/GU negative Renal ROS  negative genitourinary   Musculoskeletal   Abdominal   Peds  Hematology Cancer [facility])  Hodgkin's Lymphoma 09/1998    Anesthesia Other Findings   Reproductive/Obstetrics negative OB ROS                             Anesthesia Physical Anesthesia Plan  ASA: II  Anesthesia Plan: General   Post-op Pain Management:    Induction:   PONV Risk Score and Plan:   Airway Management Planned:   Additional Equipment:   Intra-op Plan:   Post-operative Plan:   Informed Consent: I have reviewed the patients History and Physical, chart, labs and discussed the procedure including the risks, benefits and alternatives for the proposed anesthesia with the patient or authorized representative who has indicated his/her understanding and acceptance.   Dental Advisory Given  Plan Discussed with: CRNA  Anesthesia Plan Comments:         Anesthesia Quick Evaluation

## 2017-02-21 NOTE — H&P (Signed)
Jessica Lame, MD [facility] [address] [name], [name] [address] Phone:[phone] Fax : [phone]  Primary Care Physician:  Steele Sizer, MD Primary Gastroenterologist:  Dr. Allen Norris  Pre-Procedure History & Physical: HPI:  Jessica [name] is a 51 y.o. female is here for an endoscopy.   Past Medical History:  Diagnosis Date  . ADHD (attention deficit hyperactivity disorder)   . Anxiety   . Cancer ([place])    Hodgkin's Lymphoma 09/1998  . Depression   . GERD (gastroesophageal reflux disease)   . Hypertension   . Pre-diabetes     Past Surgical History:  Procedure Laterality Date  . ABDOMINAL HYSTERECTOMY  2008  . CARPAL TUNNEL RELEASE    . GASTRIC BYPASS  03/12/2011  . TONSILLECTOMY      Prior to Admission medications   Medication Sig Start Date End Date Taking? Authorizing Provider  Cholecalciferol (VITAMIN D) 2000 units CAPS Take 1 capsule (2,000 Units total) by mouth daily. 01/22/17  Yes Sowles, Drue Stager, MD  clonazePAM (KLONOPIN) 0.5 MG tablet Take 1 tablet (0.5 mg total) by mouth at bedtime. 02/18/17  Yes Rainey Pines, MD  Cyanocobalamin (VITAMIN B-12 IJ) Inject as directed every 30 (thirty) days.   Yes [provider]  cyclobenzaprine (FLEXERIL) 5 MG tablet TAKE 1 TABLET (5 MG TOTAL) BY MOUTH 3 (THREE) TIMES DAILY AS NEEDED FOR MUSCLE SPASMS 02/19/17  Yes Sowles, Drue Stager, MD  DULoxetine (CYMBALTA) 30 MG capsule Take 1 capsule (30 mg total) by mouth daily. 02/18/17  Yes Rainey Pines, MD  levothyroxine (SYNTHROID, LEVOTHROID) 25 MCG tablet TAKE 1 TABLET BY MOUTH DAILY BEFORE BREAKFAST. 01/23/17  Yes Sowles, Drue Stager, MD  Melatonin 1 MG TABS Take 1 tablet (1 mg total) by mouth at bedtime. 08/30/16  Yes Sowles, Drue Stager, MD  Multiple Vitamins-Minerals (MULTI-VITAMIN GUMMIES) CHEW Chew 2 capsules by mouth daily. 01/22/17  Yes Sowles, Drue Stager, MD  omeprazole (PRILOSEC) 20 MG capsule Take 20 mg by mouth daily.   Yes [provider]  QUEtiapine (SEROQUEL) 50  MG tablet TAKE 1 TABLET BY MOUTH AT BEDTIME. 12/31/16  Yes Rainey Pines, MD  sucralfate (CARAFATE) 1 g tablet Take 1 tablet (1 g total) by mouth 4 (four) times daily. 02/18/17 02/18/18 Yes Lavonia Drafts, MD  atorvastatin (LIPITOR) 40 MG tablet Take 1 tablet (40 mg total) by mouth daily. 02/19/17   Steele Sizer, MD  lithium 600 MG capsule Take 1 capsule (600 mg total) by mouth at bedtime. 02/18/17   Rainey Pines, MD  metFORMIN (GLUCOPHAGE) 500 MG tablet TAKE 1 TABLET BY MOUTH TWO TIMES DAILY WITH A MEAL 10/18/16   Sowles, Drue Stager, MD  nystatin-triamcinolone [facility] II) cream Apply topically 2 (two) times daily. Reported on 11/15/2015 Patient not taking: Reported on 02/19/2017 08/30/16   Steele Sizer, MD  TRULICITY 1.5 IO/9.6EX SOPN Inject 1.5 mg into the skin once a week. 01/22/17   Steele Sizer, MD    Allergies as of 02/18/2017 - Review Complete 02/18/2017  Allergen Reaction Noted  . Vicodin [hydrocodone-acetaminophen] Other (See Comments) 12/30/2015    Family History  Problem Relation Age of Onset  . Cancer Mother        colon  . Epilepsy Mother   . Glaucoma Father     Social History   Social History  . Marital status: Married    Spouse name: N/A  . Number of children: N/A  . Years of education: N/A   Occupational History  . Not on file.   Social History Main Topics  .  Smoking status: Current Every Day Smoker    Packs/day: 0.50    Years: 14.00    Types: Cigarettes    Start date: 06/11/2000  . Smokeless tobacco: Never Used  . Alcohol use No     Comment: rarely  . Drug use: No  . Sexual activity: Yes    Partners: Male    Birth control/ protection: Surgical   Other Topics Concern  . Not on file   Social History Narrative  . No narrative on file    Review of Systems: See HPI, otherwise negative ROS  Physical Exam: BP 129/87   Pulse 84   Temp (!) 97.3 F (36.3 C) (Temporal)   Ht 5\' 5"  (1.651 m)   Wt 134 lb (60.8 kg)   SpO2 100%   BMI 22.30 kg/m  General:    Alert,  pleasant and cooperative in NAD Head:  Normocephalic and atraumatic. Neck:  Supple; no masses or thyromegaly. Lungs:  Clear throughout to auscultation.    Heart:  Regular rate and rhythm. Abdomen:  Soft, nontender and nondistended. Normal bowel sounds, without guarding, and without rebound.   Neurologic:  Alert and  oriented x4;  grossly normal neurologically.  Impression/Plan: [name] is here for an endoscopy to be performed for epigastric pain  Risks, benefits, limitations, and alternatives regarding  endoscopy have been reviewed with the patient.  Questions have been answered.  All parties agreeable.   Jessica Lame, MD  02/21/2017, 9:02 AM

## 2017-02-26 ENCOUNTER — Telehealth: Payer: Self-pay

## 2017-02-26 ENCOUNTER — Other Ambulatory Visit: Payer: Self-pay | Admitting: Family Medicine

## 2017-02-26 DIAGNOSIS — Z1231 Encounter for screening mammogram for malignant neoplasm of breast: Secondary | ICD-10-CM

## 2017-02-26 NOTE — Telephone Encounter (Signed)
Pt called states she needs a refill on her seroquel pt states that everything got refill expect for that one.    Disp Refills Start End   QUEtiapine (SEROQUEL) 50 MG tablet 30 tablet 1 12/31/2016    Sig: TAKE 1 TABLET BY MOUTH AT BEDTIME.   Sent to pharmacy as: QUEtiapine (SEROQUEL) 50 MG tablet   E-Prescribing Status: Receipt confirmed by pharmacy (12/31/2016 12:05 PM EDT)     Treatment Plan Summary: Medication management   Discussed with patient at length about the medications treatment risks benefits and alternatives  I am suspicious that patient might not be taking her medications as prescribed. She is also not eating enough leading to ulcer and would like to rule out eating disorder due to her past history of gastric bypass. She is focused on getting a prescription of Klonopin to help her sleep at night.  I have refilled her medications as prescribed   Continue Cymbalta 30 mg daily.  Seroquel 50 mg by mouth daily at bedtime .  Klonopin 0.5 mg by mouth daily at bedtime  Continue lithium carbonate 600 mg at bedtime to help with the mood symptoms.  Follow-up in 2 months or earlier depending on her symptoms    More than 50% of the time spent in psychoeducation, counseling and coordination of care.    This note was generated in part or whole with voice recognition software. Voice regonition is usually quite accurate but there are transcription errors that can and very often do occur. I apologize for any typographical errors that were not detected and corrected.    Rainey Pines, MD 9/10/20183:52 PM

## 2017-02-26 NOTE — Telephone Encounter (Signed)
Will defer to dr. Gretel Acre as she does not have follow up appointment.

## 2017-03-01 ENCOUNTER — Other Ambulatory Visit: Payer: Self-pay | Admitting: Psychiatry

## 2017-03-01 MED ORDER — QUETIAPINE FUMARATE 50 MG PO TABS: 50.0000 mg | ORAL_TABLET | Freq: Every day | ORAL | 2 refills | Status: DC

## 2017-03-04 NOTE — Telephone Encounter (Signed)
Dr clapacs refilled for patient.  QUEtiapine (SEROQUEL) 50 MG tablet  Medication  Date: 03/01/2017 Department: [facility] Ordering/Authorizing: Clapacs, Madie Reno, MD  Order Providers   Prescribing Provider Encounter Provider  Clapacs, Madie Reno, MD Clapacs, Madie Reno, MD  Medication Detail    Disp Refills Start End   QUEtiapine (SEROQUEL) 50 MG tablet 30 tablet 2 03/01/2017    Sig - Route: Take 1 tablet (50 mg total) by mouth at bedtime. - Oral   Sent to pharmacy as: QUEtiapine (SEROQUEL) 50 MG tablet   E-Prescribing Status: Receipt confirmed by pharmacy (03/01/2017 1:08 PM EDT)

## 2017-03-05 ENCOUNTER — Ambulatory Visit
Admission: RE | Admit: 2017-03-05 | Discharge: 2017-03-05 | Disposition: A | Discharge status: Home / Self Care | Payer: 59 | Source: Ambulatory Visit | Attending: Family Medicine | Admitting: Family Medicine

## 2017-03-05 ENCOUNTER — Other Ambulatory Visit: Payer: Self-pay | Admitting: Family Medicine

## 2017-03-05 DIAGNOSIS — Z1231 Encounter for screening mammogram for malignant neoplasm of breast: Secondary | ICD-10-CM | POA: Diagnosis not present

## 2017-03-05 LAB — HM MAMMOGRAPHY: HM Mammogram: NORMAL (ref 0–4)

## 2017-03-06 ENCOUNTER — Encounter: Payer: Self-pay | Admitting: Family Medicine

## 2017-03-25 ENCOUNTER — Other Ambulatory Visit: Payer: Self-pay | Admitting: Family Medicine

## 2017-03-25 DIAGNOSIS — M545 Low back pain, unspecified: Secondary | ICD-10-CM

## 2017-03-25 DIAGNOSIS — R7303 Prediabetes: Secondary | ICD-10-CM

## 2017-03-25 DIAGNOSIS — E663 Overweight: Secondary | ICD-10-CM

## 2017-03-25 DIAGNOSIS — E8881 Metabolic syndrome: Secondary | ICD-10-CM

## 2017-04-01 ENCOUNTER — Ambulatory Visit: Payer: Self-pay | Admitting: Family Medicine

## 2017-04-08 ENCOUNTER — Ambulatory Visit (INDEPENDENT_AMBULATORY_CARE_PROVIDER_SITE_OTHER): Payer: 59 | Admitting: Certified Nurse Midwife

## 2017-04-08 ENCOUNTER — Encounter: Payer: Self-pay | Admitting: Certified Nurse Midwife

## 2017-04-08 VITALS — BP 108/68 | HR 92 | Ht 65.0 in | Wt 130.0 lb

## 2017-04-08 DIAGNOSIS — R6882 Decreased libido: Secondary | ICD-10-CM

## 2017-04-08 DIAGNOSIS — Z124 Encounter for screening for malignant neoplasm of cervix: Secondary | ICD-10-CM

## 2017-04-08 DIAGNOSIS — Z01419 Encounter for gynecological examination (general) (routine) without abnormal findings: Secondary | ICD-10-CM | POA: Diagnosis not present

## 2017-04-08 DIAGNOSIS — N941 Unspecified dyspareunia: Secondary | ICD-10-CM | POA: Diagnosis not present

## 2017-04-08 DIAGNOSIS — R87810 Cervical high risk human papillomavirus (HPV) DNA test positive: Secondary | ICD-10-CM | POA: Insufficient documentation

## 2017-04-08 MED ORDER — ESTRADIOL 0.1 MG/GM VA CREA: TOPICAL_CREAM | VAGINAL | 1 refills | Status: DC

## 2017-04-08 NOTE — Progress Notes (Signed)
Gynecology Annual Exam  PCP: Steele Sizer, MD  Chief Complaint:  Chief Complaint  Patient presents with  . Gynecologic Exam    History of Present Illness: Jessica [name] is a 51 y.o. G4 P2022 who presents for a NP annual exam. The patient complains of dysparuenia and decreased libido.  She has vaginal dryness and "pain on the right side of the vagina." Has had this pain for a long time. Uses lots of lubrication with intercourse without relief. Decreased libido. Has never been on HT. Is taking Cymbalta, which may decrease libido also. Her menses are absent due to a [place] and unilateral SO for endometriosis in 2008.  Last pap smear: prior to 2012, results were normal per patient    Her past medical history is remarkable for Hodgkin's lymphoma in 2000 (excision of neck mass/radiation and chemo), bipolar disorder with anxiety, hypothyroidism, morbid obesity, gastric bypass surgery in 2012 (lost 150#), insulin resistance, and recently diagnosed jejunal ulcer.  The patient does perform self breast exams. Her last mammogram was 03/05/2017, results were negative.   There is a family history of breast cancer in her maternal grandmother Genetic testing has not been done.   There is no family history of ovarian cancer.   Her mother had colon cancer at age 43. Patient has had 3 colonoscopies in the last 6 years.   The patient reports smoking. She smokes 1/2 packs per day.  She denies drinking.   She denies illegal drug use.  The patient reports exercising occasionally.      Review of Systems: Review of Systems  Constitutional: Positive for weight loss. Negative for chills and fever.  HENT: Negative for congestion, sinus pain and sore throat.   Eyes: Negative for blurred vision and pain.  Respiratory: Negative for hemoptysis, shortness of breath and wheezing.   Cardiovascular: Negative for chest pain, palpitations and leg swelling.  Gastrointestinal: Negative for blood in  stool, diarrhea, heartburn, nausea and vomiting. Abdominal pain: jejunal ulcer.  Genitourinary: Negative for dysuria, frequency, hematuria and urgency.       Positive for dyspareunia and vaginal dryness.  Musculoskeletal: Negative for back pain, joint pain and myalgias.  Skin: Negative for itching and rash.  Neurological: Negative for dizziness, tingling and headaches.  Endo/Heme/Allergies: Negative for environmental allergies and polydipsia. Does not bruise/bleed easily.       Negative for hirsutism   Psychiatric/Behavioral: Negative for depression. The patient is nervous/anxious. The patient does not have insomnia.     Past Medical History:  Past Medical History:  Diagnosis Date  . ADHD (attention deficit hyperactivity disorder)   . Anxiety   . Bipolar 1 disorder ([place])   . Cancer ([place])    Hodgkin's Lymphoma 09/1998  . Depression   . Endometriosis   . GERD (gastroesophageal reflux disease)   . History of gastric bypass 2012  . Hodgkin's lymphoma ([place]) 2000  . Hypertension    resolved with weight loss  . Hypothyroidism   . Pre-diabetes   . Tobacco use disorder   . Ulcer jejunum     Past Surgical History:  Past Surgical History:  Procedure Laterality Date  . BUNIONECTOMY Right   . CARPAL TUNNEL RELEASE    . COLONOSCOPY     multiple  . ESOPHAGOGASTRODUODENOSCOPY (EGD) WITH PROPOFOL N/A 02/21/2017   Procedure: ESOPHAGOGASTRODUODENOSCOPY (EGD) WITH PROPOFOL;  Surgeon: Lucilla Lame, MD;  Location: [facility];  Service: Gastroenterology;  Laterality: N/A;  Pre-diabetic  . excision of neck mass  09/1998  . GASTRIC BYPASS  03/12/2011  . LAPAROSCOPIC SUPRACERVICAL HYSTERECTOMY  2008   and unilateral salpingoophorectomy/ endometriosis-Dr Kincius  . TONSILLECTOMY      Family History:  Family History  Problem Relation Age of Onset  . Cancer Mother 39       colon  . Epilepsy Mother   . Glaucoma Father   . Breast cancer Maternal Grandmother 83       inflamitory  breast ca    Social History:  Social History   Social History  . Marital status: Married    Spouse name: N/A  . Number of children: 2  . Years of education: N/A   Occupational History  . Registration Specialist    Social History Main Topics  . Smoking status: Current Every Day Smoker    Packs/day: 0.50    Years: 14.00    Types: Cigarettes    Start date: 06/11/2000  . Smokeless tobacco: Never Used  . Alcohol use No     Comment: rarely  . Drug use: No  . Sexual activity: Yes    Partners: Male    Birth control/ protection: Surgical   Other Topics Concern  . Not on file   Social History Narrative  . No narrative on file    Allergies:  Allergies  Allergen Reactions  . Vicodin [Hydrocodone-Acetaminophen] Other (See Comments)    Has really bad headaches/ migraine      Medications: Current Outpatient Prescriptions on File Prior to Visit  Medication Sig Dispense Refill  . atorvastatin (LIPITOR) 40 MG tablet Take 1 tablet (40 mg total) by mouth daily. 90 tablet 3  . Cholecalciferol (VITAMIN D) 2000 units CAPS Take 1 capsule (2,000 Units total) by mouth daily. 30 capsule 0  . clonazePAM (KLONOPIN) 0.5 MG tablet Take 1 tablet (0.5 mg total) by mouth at bedtime. 30 tablet 2  . Cyanocobalamin (VITAMIN B-12 IJ) Inject as directed every 30 (thirty) days.    . cyclobenzaprine (FLEXERIL) 5 MG tablet TAKE 1 TABLET (5 MG TOTAL) BY MOUTH 3 (THREE) TIMES DAILY AS NEEDED FOR MUSCLE SPASMS 30 tablet 0  . DULoxetine (CYMBALTA) 30 MG capsule Take 1 capsule (30 mg total) by mouth daily. 30 capsule 2  . levothyroxine (SYNTHROID, LEVOTHROID) 25 MCG tablet TAKE 1 TABLET BY MOUTH DAILY BEFORE BREAKFAST. 30 tablet 2  . Melatonin 1 MG TABS Take 1 tablet (1 mg total) by mouth at bedtime. 30 tablet 12  . metFORMIN (GLUCOPHAGE) 500 MG tablet TAKE 1 TABLET BY MOUTH TWO TIMES DAILY WITH A MEAL 180 tablet 0  . Multiple Vitamins-Minerals (MULTI-VITAMIN GUMMIES) CHEW Chew 2 capsules by mouth daily. 30  tablet 0  . omeprazole (PRILOSEC) 20 MG capsule Take 20 mg by mouth daily.    . QUEtiapine (SEROQUEL) 50 MG tablet Take 1 tablet (50 mg total) by mouth at bedtime. 30 tablet 2  . sucralfate (CARAFATE) 1 g tablet Take 1 tablet (1 g total) by mouth 4 (four) times daily. 120 tablet 1  . TRULICITY 1.5 BZ/1.6RC SOPN Inject 1.5 mg into the skin once a week. 4 pen 2   No current facility-administered medications on file prior to visit.   Physical Exam Vitals: BP 108/68   Pulse 92   Ht 5\' 5"  (1.651 m)   Wt 130 lb (59 kg)   BMI 21.63 kg/m   General: WF in NAD HEENT: normocephalic, anicteric Neck: no thyroid enlargement, no palpable nodules, no cervical lymphadenopathy. Supraclavicular scar present on left from excision of  Hodgkin's lymphoma Pulmonary: No increased work of breathing, CTAB Cardiovascular: RRR, without murmur  Breast: Breast symmetrical, no tenderness, no palpable nodules or masses, no skin or nipple retraction present, no nipple discharge.  No axillary, infraclavicular or supraclavicular lymphadenopathy. Abdomen: Soft, non-tender, non-distended.  Umbilicus without lesions.  No hepatomegaly or masses palpable. No evidence of hernia. Genitourinary:  External: Normal external genitalia.  Normal urethral meatus, normal Bartholin's and Skene's glands.    Vagina: tender upon insertion of digit or speculum at introitus and on posterior wall of vagina about 2-3 cm from introitus where there is a ridge of tissue (pink mucosal tissue); flattened rugae. No disc  Cervix: stenotic and almost flush with the lateral cul de sacs, no bleeding, non-tender  Uterus: surgically absent  Adnexa: No adnexal masses, non-tender  Rectal: deferred  Lymphatic: no evidence of inguinal lymphadenopathy Extremities: no edema, erythema, or tenderness Neurologic: Grossly intact Psychiatric: mood appropriate, affect full-very anxious     Assessment: 51 y.o. gynecological exam Dyspareunia probably due to  atrophic changes in menopause  Will start on vaginal estrogen: 1 GM nightly x 2 weeks then 3x/week  Follow up in 4-6 weeks.  No intercourse x 2 weeks Decreased libido  Will readdress after dyspareunia is resolved  Consider a trail of Wellbutrin 75 mgm on days of IC vs testosterone cream  Plan:  1) Breast cancer screening - recommend monthly self breast exam and annual mammograms. Mammogram is up to date.  2) Colon cancer screening: UTD.  3) Cervical cancer screening - Pap was done  4)Routine healthcare maintenance per PCP Dr Ancil Boozer  5) RTO 4-6 weeks  Dalia Heading, CNM

## 2017-04-10 LAB — IGP, APTIMA HPV
HPV APTIMA: POSITIVE — AB
PAP SMEAR COMMENT: 0

## 2017-04-16 ENCOUNTER — Encounter (INDEPENDENT_AMBULATORY_CARE_PROVIDER_SITE_OTHER): Payer: Self-pay

## 2017-04-17 ENCOUNTER — Encounter: Payer: Self-pay | Admitting: Family Medicine

## 2017-04-24 ENCOUNTER — Ambulatory Visit (INDEPENDENT_AMBULATORY_CARE_PROVIDER_SITE_OTHER): Payer: 59 | Admitting: Family Medicine

## 2017-04-24 ENCOUNTER — Encounter: Payer: Self-pay | Admitting: Family Medicine

## 2017-04-24 VITALS — BP 128/76 | HR 125 | Temp 98.6°F | Resp 16 | Ht 65.0 in | Wt 130.0 lb

## 2017-04-24 DIAGNOSIS — C8190 Hodgkin lymphoma, unspecified, unspecified site: Secondary | ICD-10-CM | POA: Diagnosis not present

## 2017-04-24 DIAGNOSIS — C819A Hodgkin lymphoma, unspecified, in remission: Secondary | ICD-10-CM

## 2017-04-24 DIAGNOSIS — F3162 Bipolar disorder, current episode mixed, moderate: Secondary | ICD-10-CM

## 2017-04-24 DIAGNOSIS — E538 Deficiency of other specified B group vitamins: Secondary | ICD-10-CM | POA: Diagnosis not present

## 2017-04-24 DIAGNOSIS — F339 Major depressive disorder, recurrent, unspecified: Secondary | ICD-10-CM

## 2017-04-24 DIAGNOSIS — K289 Gastrojejunal ulcer, unspecified as acute or chronic, without hemorrhage or perforation: Secondary | ICD-10-CM | POA: Diagnosis not present

## 2017-04-24 DIAGNOSIS — E663 Overweight: Secondary | ICD-10-CM

## 2017-04-24 DIAGNOSIS — R87810 Cervical high risk human papillomavirus (HPV) DNA test positive: Secondary | ICD-10-CM | POA: Diagnosis not present

## 2017-04-24 DIAGNOSIS — E8881 Metabolic syndrome: Secondary | ICD-10-CM

## 2017-04-24 DIAGNOSIS — R590 Localized enlarged lymph nodes: Secondary | ICD-10-CM

## 2017-04-24 DIAGNOSIS — E038 Other specified hypothyroidism: Secondary | ICD-10-CM

## 2017-04-24 DIAGNOSIS — E88819 Insulin resistance, unspecified: Secondary | ICD-10-CM

## 2017-04-24 DIAGNOSIS — R7303 Prediabetes: Secondary | ICD-10-CM

## 2017-04-24 MED ORDER — TRULICITY 1.5 MG/0.5ML ~~LOC~~ SOAJ: 1.5000 mg | SUBCUTANEOUS | 2 refills | Status: DC

## 2017-04-24 MED ORDER — LEVOTHYROXINE SODIUM 25 MCG PO TABS: ORAL_TABLET | ORAL | 2 refills | Status: DC

## 2017-04-24 NOTE — Progress Notes (Signed)
Name: Jessica [name]   MRN: [id number]    DOB: Mar 16, 1966   Date:04/24/2017       Progress Note  Subjective  Chief Complaint  Chief Complaint  Patient presents with  . Medication Refill    3 month F/U    HPI  Obesity: she had bariatric surgery 03/2011, maximum weight prior to surgery was 270 lbs, she went down to 142 lbs and was stable for many years, however since she was diagnosed with bipolar in 2017 and was placed on antipsychotic medications and  gained weight, March 2018 weight was 183 Lbs. She was on Levothyroxine and Metformin and went down to 165 lbs in May 2018 and we started her on Trulicity to help curb appetite and help with pre-diabetes/insulin resistance and she is below her goal weight ( it was 140 lbs). Advised her to stop metformin, and try to keep weight between 130-140 lbs.   Pre-diabetes:hgbA1C was back in 2013 was 6.3%, but down to 5.3 % with weight loss , she was gradually gaining weight after bariatric surgery and we started her on Metformin 08/2016 and also Synthroid for elevated  TSH and weight gain. She is on Trulicity and is doing well, avoiding sweets, no longer drinking sodas, tea or juice. She makes better meal choices and feels like the medication is working well for her. Having protein shakes daily, eating healthy, we will continue Trulicity but stop Metformin   B12 deficiency: getting monthly B12 and is doing well  Vitamin D deficiency: taking otc supplementation, but too high of a dose, advised to go down from 10000 units daily to 2000 units daily   High TSH: associated with fatigue, and weight gain, started on Levothyroxine 08/2016 and is tolerating well, still tired, but losing weight, last labs at goal   Hodgkin's lymphoma: treated back in 2000, last WBC was normal, but the one prior to that was high, no night sweats, fever, CT scan abdomen showed gastrohepatic ligament lymphadenopathy, we will send her to hematologist .  Bipolar: seeing Dr.  Maricela Curet, she still has a lot of anxiety, she has been sleeping well, going to be at 9 pm, but still getting up at 4 am, goes to work at 7 am. She states mood is stable, able to control anxiety well.   Jejunal ulcer: seen by Dr. Durwin Reges , still on Carafate and PPI     Patient Active Problem List   Diagnosis Date Noted  . Cervical high risk HPV (human papillomavirus) test positive 04/08/2017  . Abdominal pain, epigastric   . Bariatric surgery status   . Ulcer jejunum   . Adenoma of left adrenal gland 02/18/2017  . Abnormal TSH 08/30/2016  . Bipolar disorder ([place]) 12/02/2015  . HAV (hallux abducto valgus) 11/22/2015  . Bunion of great toe 11/08/2015  . Attention-deficit hyperactivity disorder, other type 10/28/2015  . Sciatica 04/22/2015  . B12 deficiency 12/01/2014  . History of bariatric surgery 12/01/2014  . Anxiety, generalized 12/01/2014  . Gastro-esophageal reflux disease without esophagitis 12/01/2014  . H/O elevated lipids 12/01/2014  . H/O: HTN (hypertension) 12/01/2014  . H/O: obesity 12/01/2014  . Hodgkin's disease in remission ([place]) 12/01/2014  . Current tobacco use 12/01/2014  . Insomnia 03/24/2008  . Abnormal serum level of alkaline phosphatase 11/08/2006  . Chronic recurrent major depressive disorder ([place]) 11/08/2006    Past Surgical History:  Procedure Laterality Date  . BUNIONECTOMY Right   . CARPAL TUNNEL RELEASE    . COLONOSCOPY  multiple  . excision of neck mass  09/1998  . GASTRIC BYPASS  03/12/2011  . LAPAROSCOPIC SUPRACERVICAL HYSTERECTOMY  2008   and unilateral salpingoophorectomy/ endometriosis-Dr Kincius  . TONSILLECTOMY      Family History  Problem Relation Age of Onset  . Cancer Mother 60       colon  . Epilepsy Mother   . Glaucoma Father   . Breast cancer Maternal Grandmother 83       inflamitory breast ca    Social History   Socioeconomic History  . Marital status: Married    Spouse name: Not on file  . Number of children: 2  .  Years of education: Not on file  . Highest education level: Not on file  Social Needs  . Financial resource strain: Not on file  . Food insecurity - worry: Not on file  . Food insecurity - inability: Not on file  . Transportation needs - medical: Not on file  . Transportation needs - non-medical: Not on file  Occupational History  . Occupation: Therapist, art  Tobacco Use  . Smoking status: Current Every Day Smoker    Packs/day: 0.50    Years: 14.00    Pack years: 7.00    Types: Cigarettes    Start date: 06/11/2000  . Smokeless tobacco: Never Used  Substance and Sexual Activity  . Alcohol use: No    Alcohol/week: 0.0 oz    Comment: rarely  . Drug use: No  . Sexual activity: Yes    Partners: Male    Birth control/protection: Surgical  Other Topics Concern  . Not on file  Social History Narrative  . Not on file     Current Outpatient Medications:  .  atorvastatin (LIPITOR) 40 MG tablet, Take 1 tablet (40 mg total) by mouth daily., Disp: 90 tablet, Rfl: 3 .  Cholecalciferol (VITAMIN D) 2000 units CAPS, Take 1 capsule (2,000 Units total) by mouth daily., Disp: 30 capsule, Rfl: 0 .  clonazePAM (KLONOPIN) 0.5 MG tablet, Take 1 tablet (0.5 mg total) by mouth at bedtime., Disp: 30 tablet, Rfl: 2 .  cyanocobalamin (,VITAMIN B-12,) 1000 MCG/ML injection, , Disp: , Rfl: 5 .  Cyanocobalamin (VITAMIN B-12 IJ), Inject as directed every 30 (thirty) days., Disp: , Rfl:  .  cyclobenzaprine (FLEXERIL) 5 MG tablet, TAKE 1 TABLET (5 MG TOTAL) BY MOUTH 3 (THREE) TIMES DAILY AS NEEDED FOR MUSCLE SPASMS, Disp: 30 tablet, Rfl: 0 .  DULoxetine (CYMBALTA) 30 MG capsule, Take 1 capsule (30 mg total) by mouth daily., Disp: 30 capsule, Rfl: 2 .  estradiol (ESTRACE VAGINAL) 0.1 MG/GM vaginal cream, Insert 1 gm vaginally qhs x 2 weeks then decrease to 1GM 3 times/week, Disp: 42.5 g, Rfl: 1 .  levothyroxine (SYNTHROID, LEVOTHROID) 25 MCG tablet, TAKE 1 TABLET BY MOUTH DAILY BEFORE BREAKFAST., Disp:  30 tablet, Rfl: 2 .  metFORMIN (GLUCOPHAGE) 500 MG tablet, TAKE 1 TABLET BY MOUTH TWO TIMES DAILY WITH A MEAL, Disp: 180 tablet, Rfl: 0 .  Multiple Vitamins-Minerals (MULTI-VITAMIN GUMMIES) CHEW, Chew 2 capsules by mouth daily., Disp: 30 tablet, Rfl: 0 .  omeprazole (PRILOSEC) 20 MG capsule, Take 20 mg by mouth daily., Disp: , Rfl:  .  QUEtiapine (SEROQUEL) 50 MG tablet, Take 1 tablet (50 mg total) by mouth at bedtime., Disp: 30 tablet, Rfl: 2 .  sucralfate (CARAFATE) 1 g tablet, Take 1 tablet (1 g total) by mouth 4 (four) times daily., Disp: 120 tablet, Rfl: 1 .  SF 5000 PLUS 1.1 %  CREA dental cream, , Disp: , Rfl: 3 .  TRULICITY 1.5 SA/6.3KZ SOPN, Inject 1.5 mg into the skin once a week., Disp: 4 pen, Rfl: 2  Allergies  Allergen Reactions  . Vicodin [Hydrocodone-Acetaminophen] Other (See Comments)    Has really bad headaches/ migraine       ROS  Constitutional: Negative for fever , positive for  weight change.  Respiratory: Negative for cough and shortness of breath.   Cardiovascular: Negative for chest pain or palpitations.  Gastrointestinal: Negative for abdominal pain, no bowel changes.  Musculoskeletal: Negative for gait problem or joint swelling.  Skin: Negative for rash.  Neurological: Negative for dizziness or headache.  No other specific complaints in a complete review of systems (except as listed in HPI above).  Objective  Vitals:   04/24/17 1542  BP: 128/76  Pulse: (!) 125  Resp: 16  Temp: 98.6 F (37 C)  TempSrc: Oral  SpO2: 98%  Weight: 130 lb (59 kg)  Height: '5\' 5"'$  (1.651 m)    Body mass index is 21.63 kg/m.  Physical Exam  Constitutional: Patient appears well-developed and well-nourished.  No distress.  HEENT: head atraumatic, normocephalic, pupils equal and reactive to light,  neck supple, throat within normal limits Cardiovascular: Normal rate, regular rhythm and normal heart sounds.  No murmur heard. No BLE edema. Pulmonary/Chest: Effort normal and  breath sounds normal. No respiratory distress. Abdominal: Soft.  There is no tenderness. Psychiatric: Patient has a normal mood and affect. behavior is normal. Judgment and thought content normal.  Recent Results (from the past 2160 hour(s))  Lipase, blood     Status: None   Collection Time: 02/18/17  9:49 AM  Result Value Ref Range   Lipase 31 11 - 51 U/L  Comprehensive metabolic panel     Status: Abnormal   Collection Time: 02/18/17  9:49 AM  Result Value Ref Range   Sodium 137 135 - 145 mmol/L   Potassium 4.2 3.5 - 5.1 mmol/L   Chloride 102 101 - 111 mmol/L   CO2 28 22 - 32 mmol/L   Glucose, Bld 128 (H) 65 - 99 mg/dL   BUN 17 6 - 20 mg/dL   Creatinine, Ser 0.61 0.44 - 1.00 mg/dL   Calcium 9.1 8.9 - 10.3 mg/dL   Total Protein 7.1 6.5 - 8.1 g/dL   Albumin 3.5 3.5 - 5.0 g/dL   AST 22 15 - 41 U/L   ALT 16 14 - 54 U/L   Alkaline Phosphatase 157 (H) 38 - 126 U/L   Total Bilirubin 0.3 0.3 - 1.2 mg/dL   GFR calc non Af Amer >60 >60 mL/min   GFR calc Af Amer >60 >60 mL/min    Comment: (NOTE) The eGFR has been calculated using the CKD EPI equation. This calculation has not been validated in all clinical situations. eGFR's persistently <60 mL/min signify possible Chronic Kidney Disease.    Anion gap 7 5 - 15  CBC     Status: Abnormal   Collection Time: 02/18/17  9:49 AM  Result Value Ref Range   WBC 9.6 3.6 - 11.0 K/uL   RBC 4.22 3.80 - 5.20 MIL/uL   Hemoglobin 12.4 12.0 - 16.0 g/dL   HCT 36.7 35.0 - 47.0 %   MCV 87.0 80.0 - 100.0 fL   MCH 29.4 26.0 - 34.0 pg   MCHC 33.8 32.0 - 36.0 g/dL   RDW 15.2 (H) 11.5 - 14.5 %   Platelets 359 150 - 440 K/uL  HM MAMMOGRAPHY     Status: None   Collection Time: 03/05/17 12:00 AM  Result Value Ref Range   HM Mammogram Self Reported Normal 0-4 Bi-Rad, Self Reported Normal    Comment: [facility] from [facility]  IGP, Aptima HPV     Status: Abnormal   Collection Time: 04/08/17  4:28 PM  Result Value Ref Range   DIAGNOSIS:  Comment     Comment: NEGATIVE FOR INTRAEPITHELIAL LESION AND MALIGNANCY.   Specimen adequacy: Comment     Comment: Satisfactory for evaluation. Endocervical and/or squamous metaplastic cells (endocervical component) are present.    Clinician Provided ICD10 Comment     Comment: Z12.4   Performed by: Comment     Comment: Elgie Congo, Cytotechnologist (ASCP)   PAP Smear Comment .    Note: Comment     Comment: The Pap smear is a screening test designed to aid in the detection of premalignant and malignant conditions of the uterine cervix.  It is not a diagnostic procedure and should not be used as the sole means of detecting cervical cancer.  Both false-positive and false-negative reports do occur.    Test Methodology Comment     Comment: This liquid based ThinPrep(R) pap test was screened with the use of an image guided system.    HPV Aptima Positive (A) Negative    Comment: This test detects fourteen high-risk HPV types (16/18/31/33/35/39/45/ 51/52/56/58/59/66/68) without differentiation.       PHQ2/9: Depression screen [facility] 2/9 04/24/2017 02/19/2017 08/28/2016 12/02/2015 11/08/2015  Decreased Interest 0 0 0 0 1  Down, Depressed, Hopeless 0 0 0 1 3  PHQ - 2 Score 0 0 0 1 4  Altered sleeping - - - 0 2  Tired, decreased energy - - - 0 1  Change in appetite - - - 0 0  Feeling bad or failure about yourself  - - - 1 1  Trouble concentrating - - - - 2  Moving slowly or fidgety/restless - - - 1 2  Suicidal thoughts - - - 0 0  PHQ-9 Score - - - 3 12  Difficult doing work/chores - - - - Extremely dIfficult     Fall Risk: Fall Risk  04/24/2017 02/19/2017 08/28/2016 12/02/2015 11/08/2015  Falls in the past year? No No No No No     Functional Status Survey: Is the patient deaf or have difficulty hearing?: No Does the patient have difficulty seeing, even when wearing glasses/contacts?: No Does the patient have difficulty concentrating, remembering, or making decisions?: No Does the  patient have difficulty walking or climbing stairs?: No Does the patient have difficulty dressing or bathing?: No Does the patient have difficulty doing errands alone such as visiting a doctor's office or shopping?: No    Assessment & Plan  1. Prediabetes  - TRULICITY 1.5 UX/3.2GM SOPN; Inject 1.5 mg once a week into the skin.  Dispense: 4 pen; Refill: 2  2. Cervical high risk HPV (human papillomavirus) test positive  Continue follow up with [facility]  3. Bipolar disorder, current episode mixed, moderate ([place])  She has follow up Dr. Maricela Curet   4. Other specified hypothyroidism  Continue levothyroxine  5. B12 deficiency  Continue supplementatoin   6. Ulcer jejunum  Seen by Dr. Durwin Reges   7. Hodgkin's disease in remission [facility])  - Ambulatory referral to Hematology  8. Chronic recurrent major depressive disorder (H[facility])   9. Abdominal lymphadenopathy  - Ambulatory referral to Hematology  10. Insulin resistance  -  TRULICITY 1.5 GB/0.2XJ SOPN; Inject 1.5 mg once a week into the skin.  Dispense: 4 pen; Refill: 2 She lost 10 lbs in the past 3 months, she is recovering form jejunal ulcer, but explained that if she continues to lose weight we will stop or decrease dose, needs to stay within a normal BMI

## 2017-05-03 ENCOUNTER — Other Ambulatory Visit: Payer: Self-pay | Admitting: Family Medicine

## 2017-05-03 DIAGNOSIS — M545 Low back pain, unspecified: Secondary | ICD-10-CM

## 2017-05-06 NOTE — Telephone Encounter (Signed)
Patient requesting refill of Cyclobenzaprine to [facility].

## 2017-05-09 ENCOUNTER — Ambulatory Visit: Payer: 59 | Admitting: Certified Nurse Midwife

## 2017-05-13 ENCOUNTER — Ambulatory Visit (INDEPENDENT_AMBULATORY_CARE_PROVIDER_SITE_OTHER): Payer: 59 | Admitting: Psychiatry

## 2017-05-13 ENCOUNTER — Encounter: Payer: Self-pay | Admitting: Psychiatry

## 2017-05-13 ENCOUNTER — Other Ambulatory Visit: Payer: Self-pay

## 2017-05-13 VITALS — BP 110/73 | HR 100 | Temp 99.1°F | Wt 131.4 lb

## 2017-05-13 DIAGNOSIS — F411 Generalized anxiety disorder: Secondary | ICD-10-CM | POA: Diagnosis not present

## 2017-05-13 DIAGNOSIS — F39 Unspecified mood [affective] disorder: Secondary | ICD-10-CM

## 2017-05-13 MED ORDER — CLONAZEPAM 0.5 MG PO TABS: 0.5000 mg | ORAL_TABLET | Freq: Every day | ORAL | 2 refills | Status: DC

## 2017-05-13 MED ORDER — DULOXETINE HCL 30 MG PO CPEP: 30.0000 mg | ORAL_CAPSULE | Freq: Every day | ORAL | 2 refills | Status: DC

## 2017-05-13 MED ORDER — QUETIAPINE FUMARATE 50 MG PO TABS: 50.0000 mg | ORAL_TABLET | Freq: Every day | ORAL | 2 refills | Status: DC

## 2017-05-13 MED ORDER — LITHIUM CARBONATE ER 300 MG PO TBCR: 600.0000 mg | EXTENDED_RELEASE_TABLET | Freq: Every day | ORAL | 3 refills | Status: DC

## 2017-05-13 NOTE — Progress Notes (Signed)
Psychiatric MD Progress Note   Patient Identification: Jessica [name] MRN:  [id number] Date of Evaluation:  05/13/2017 Referral Source: PCP Chief Complaint:   Chief Complaint    Follow-up; Medication Refill     Visit Diagnosis:    ICD-10-CM   1. Episodic mood disorder ([place]) F39   2. Anxiety, generalized F41.1     History of Present Illness:    Patient is a 51 -year-old married female with history of mood symptoms presented for follow-up. She reported that she Has lost weight  since September. She reported that she was having severe abdominal pain related to the ulcer and was unable to eat. Patient reported that she has been doing well on her current combination the medications. She currently denied having any suicidal ideations or plans. Patient reported that she does not want to change any of her medications. She appears calm and alert during the interview. We discussed about her medications in detail. She takes lithium on alternate days. Does not have any acute symptoms at this time.    .  She stated that she is compliant with her medications and has been taking Klonopin to help her sleep at night. She reported that she can only sleep 5 hours due to her pain.  She denied having any suicidal ideations or plans.       Associated Signs/Symptoms: Depression Symptoms:  anhedonia, insomnia, fatigue, anxiety, (Hypo) Manic Symptoms:  Labiality of Mood, Anxiety Symptoms:  Excessive Worry, Panic Symptoms, Psychotic Symptoms:  none PTSD Symptoms: Negative NA  Past Psychiatric History:  No h/o admission No h/o SA    Previous Psychotropic Medications:  Prozac Paxil Effexor- worked best, increased LFT Cymbalta  seroquel xanax  Substance Abuse History in the last 12 months:  Yes.    Social drinker   Consequences of Substance Abuse: Negative NA  Past Medical History:  Past Medical History:  Diagnosis Date  . ADHD (attention deficit hyperactivity disorder)    . Anxiety   . Bipolar 1 disorder ([place])   . Cancer ([place])    Hodgkin's Lymphoma 09/1998  . Depression   . Endometriosis   . GERD (gastroesophageal reflux disease)   . History of gastric bypass 2012  . Hodgkin's lymphoma ([place]) 2000  . Hypertension    resolved with weight loss  . Hypothyroidism   . Pre-diabetes   . Tobacco use disorder   . Ulcer jejunum     Past Surgical History:  Procedure Laterality Date  . BUNIONECTOMY Right   . CARPAL TUNNEL RELEASE    . COLONOSCOPY     multiple  . ESOPHAGOGASTRODUODENOSCOPY (EGD) WITH PROPOFOL N/A 02/21/2017   Procedure: ESOPHAGOGASTRODUODENOSCOPY (EGD) WITH PROPOFOL;  Surgeon: Lucilla Lame, MD;  Location: [facility];  Service: Gastroenterology;  Laterality: N/A;  Pre-diabetic  . excision of neck mass  09/1998  . GASTRIC BYPASS  03/12/2011  . LAPAROSCOPIC SUPRACERVICAL HYSTERECTOMY  2008   and unilateral salpingoophorectomy/ endometriosis-Dr Kincius  . TONSILLECTOMY      Family Psychiatric History:  She denied any family history of psychiatric illness   Family History:  Family History  Problem Relation Age of Onset  . Cancer Mother 50       colon  . Epilepsy Mother   . Glaucoma Father   . Breast cancer Maternal Grandmother 83       inflamitory breast ca    Social History:   Social History   Socioeconomic History  . Marital status: Married    Spouse name: allen  .  Number of children: 2  . Years of education: None  . Highest education level: Associate degree: occupational, Hotel manager, or vocational program  Social Needs  . Financial resource strain: Not hard at all  . Food insecurity - worry: Never true  . Food insecurity - inability: Never true  . Transportation needs - medical: No  . Transportation needs - non-medical: No  Occupational History  . Occupation: Therapist, art  Tobacco Use  . Smoking status: Current Every Day Smoker    Packs/day: 0.50    Years: 14.00    Pack years: 7.00    Types:  Cigarettes    Start date: 06/11/2000  . Smokeless tobacco: Never Used  Substance and Sexual Activity  . Alcohol use: No    Alcohol/week: 0.0 oz    Comment: rarely  . Drug use: No  . Sexual activity: Yes    Partners: Male    Birth control/protection: Surgical  Other Topics Concern  . None  Social History Narrative  . None    Additional Social History:  Married x 17 years for first times.  Has children - 2 , 54 and 23  Now married x 9 years - Has good relationship with husband   Allergies:   Allergies  Allergen Reactions  . Vicodin [Hydrocodone-Acetaminophen] Other (See Comments)    Has really bad headaches/ migraine      Metabolic Disorder Labs: Lab Results  Component Value Date   HGBA1C 5.3 08/29/2016   MPG 105 08/29/2016   No results found for: PROLACTIN Lab Results  Component Value Date   CHOL 176 08/29/2016   TRIG 102 08/29/2016   HDL 52 08/29/2016   CHOLHDL 3.4 08/29/2016   VLDL 20 08/29/2016   LDLCALC 104 (H) 08/29/2016   LDLCALC 77 10/29/2014     Current Medications: Current Outpatient Medications  Medication Sig Dispense Refill  . atorvastatin (LIPITOR) 40 MG tablet Take 1 tablet (40 mg total) by mouth daily. 90 tablet 3  . Cholecalciferol (VITAMIN D) 2000 units CAPS Take 1 capsule (2,000 Units total) by mouth daily. 30 capsule 0  . clonazePAM (KLONOPIN) 0.5 MG tablet Take 1 tablet (0.5 mg total) by mouth at bedtime. 30 tablet 2  . cyanocobalamin (,VITAMIN B-12,) 1000 MCG/ML injection   5  . Cyanocobalamin (VITAMIN B-12 IJ) Inject as directed every 30 (thirty) days.    . cyclobenzaprine (FLEXERIL) 5 MG tablet TAKE 1 TABLET BY MOUTH THREE TIMES DAILY AS NEEDED FOR MUSCLE SPASMS 30 tablet 0  . DULoxetine (CYMBALTA) 30 MG capsule Take 1 capsule (30 mg total) by mouth daily. 30 capsule 2  . estradiol (ESTRACE VAGINAL) 0.1 MG/GM vaginal cream Insert 1 gm vaginally qhs x 2 weeks then decrease to 1GM 3 times/week 42.5 g 1  . levothyroxine (SYNTHROID,  LEVOTHROID) 25 MCG tablet TAKE 1 TABLET BY MOUTH DAILY BEFORE BREAKFAST. 30 tablet 2  . Multiple Vitamins-Minerals (MULTI-VITAMIN GUMMIES) CHEW Chew 2 capsules by mouth daily. 30 tablet 0  . omeprazole (PRILOSEC) 20 MG capsule Take 20 mg by mouth daily.    . QUEtiapine (SEROQUEL) 50 MG tablet Take 1 tablet (50 mg total) by mouth at bedtime. 30 tablet 2  . SF 5000 PLUS 1.1 % CREA dental cream   3  . sucralfate (CARAFATE) 1 g tablet Take 1 tablet (1 g total) by mouth 4 (four) times daily. 120 tablet 1  . TRULICITY 1.5 AL/9.3XT SOPN Inject 1.5 mg once a week into the skin. 4 pen 2  . lithium carbonate (  LITHOBID) 300 MG CR tablet Take 2 tablets (600 mg total) by mouth at bedtime. 60 tablet 3   No current facility-administered medications for this visit.     Neurologic: Headache: No Seizure: No Paresthesias:No  Musculoskeletal: Strength & Muscle Tone: within normal limits Gait & Station: normal Patient leans: N/A  Psychiatric Specialty Exam: Review of Systems  Musculoskeletal: Positive for joint pain.  Psychiatric/Behavioral: Positive for depression. The patient is nervous/anxious.     Blood pressure 110/73, pulse 100, temperature 99.1 F (37.3 C), temperature source Oral, weight 131 lb 6.4 oz (59.6 kg).Body mass index is 21.87 kg/m.  General Appearance: Well Groomed  Eye Contact:  Fair  Speech:  Normal Rate  Volume:  Normal  Mood:  Anxious  Affect:  Depressed and Labile  Thought Process:  Coherent  Orientation:  Full (Time, Place, and Person)  Thought Content:  WDL  Suicidal Thoughts:  No  Homicidal Thoughts:  No  Memory:  Immediate;   Fair Recent;   Fair Remote;   Fair  Judgement:  Good  Insight:  Fair  Psychomotor Activity:  Increased  Concentration:  Concentration: Fair and Attention Span: Fair  Recall:  [employer] of Knowledge:Fair  Language: Fair  Akathisia:  No  Handed:  Right  AIMS (if indicated):    Assets:  Communication Skills Desire for  Improvement Physical Health Social Support Transportation  ADL's:  Intact  Cognition: WNL  Sleep:      Treatment Plan Summary: Medication management   Discussed with patient at length about the medications treatment risks benefits and alternatives  I am suspicious that patient might not be taking her medications as prescribed. She is also not eating enough leading to ulcer and would like to rule out eating disorder due to her past history of gastric bypass. She is focused on getting a prescription of Klonopin to help her sleep at night.  I have refilled her medications as prescribed   Continue Cymbalta 30 mg daily.  Seroquel 50 mg by mouth daily at bedtime .  Klonopin 0.5 mg by mouth daily at bedtime  Continue lithium carbonate 600 mg at bedtime to help with the mood symptoms.  Follow-up in 3 months or earlier depending on her symptoms    More than 50% of the time spent in psychoeducation, counseling and coordination of care.    This note was generated in part or whole with voice recognition software. Voice regonition is usually quite accurate but there are transcription errors that can and very often do occur. I apologize for any typographical errors that were not detected and corrected.    Rainey Pines, MD 12/3/20184:04 PM

## 2017-05-24 ENCOUNTER — Other Ambulatory Visit: Payer: Self-pay | Admitting: Family Medicine

## 2017-05-24 DIAGNOSIS — M545 Low back pain, unspecified: Secondary | ICD-10-CM

## 2017-05-30 ENCOUNTER — Ambulatory Visit: Payer: 59 | Admitting: Certified Nurse Midwife

## 2017-06-24 ENCOUNTER — Other Ambulatory Visit: Payer: Self-pay | Admitting: Family Medicine

## 2017-06-24 DIAGNOSIS — M545 Low back pain, unspecified: Secondary | ICD-10-CM

## 2017-06-24 NOTE — Telephone Encounter (Signed)
Refill request for general medication.  Flexeril to [facility].   Last office visit: 04/24/2017   Follow up on 07/26/2017

## 2017-07-08 ENCOUNTER — Telehealth: Payer: 59 | Admitting: Family

## 2017-07-08 DIAGNOSIS — R399 Unspecified symptoms and signs involving the genitourinary system: Secondary | ICD-10-CM | POA: Diagnosis not present

## 2017-07-08 MED ORDER — CEPHALEXIN 500 MG PO CAPS: 500.0000 mg | ORAL_CAPSULE | Freq: Two times a day (BID) | ORAL | 0 refills | Status: DC

## 2017-07-08 NOTE — Progress Notes (Signed)

## 2017-07-24 ENCOUNTER — Other Ambulatory Visit: Payer: Self-pay | Admitting: Family Medicine

## 2017-07-24 DIAGNOSIS — Z9884 Bariatric surgery status: Secondary | ICD-10-CM

## 2017-07-24 DIAGNOSIS — E538 Deficiency of other specified B group vitamins: Secondary | ICD-10-CM

## 2017-07-24 DIAGNOSIS — M545 Low back pain, unspecified: Secondary | ICD-10-CM

## 2017-07-25 ENCOUNTER — Encounter: Payer: Self-pay | Admitting: Family Medicine

## 2017-07-25 ENCOUNTER — Other Ambulatory Visit: Payer: Self-pay | Admitting: Family Medicine

## 2017-07-25 ENCOUNTER — Ambulatory Visit (INDEPENDENT_AMBULATORY_CARE_PROVIDER_SITE_OTHER): Payer: 59 | Admitting: Family Medicine

## 2017-07-25 VITALS — BP 112/62 | Temp 98.3°F | Resp 16 | Wt 135.1 lb

## 2017-07-25 DIAGNOSIS — R7303 Prediabetes: Secondary | ICD-10-CM

## 2017-07-25 DIAGNOSIS — I700 Atherosclerosis of aorta: Secondary | ICD-10-CM | POA: Diagnosis not present

## 2017-07-25 DIAGNOSIS — Z9884 Bariatric surgery status: Secondary | ICD-10-CM

## 2017-07-25 DIAGNOSIS — E538 Deficiency of other specified B group vitamins: Secondary | ICD-10-CM | POA: Diagnosis not present

## 2017-07-25 DIAGNOSIS — C8190 Hodgkin lymphoma, unspecified, unspecified site: Secondary | ICD-10-CM

## 2017-07-25 DIAGNOSIS — E038 Other specified hypothyroidism: Secondary | ICD-10-CM

## 2017-07-25 DIAGNOSIS — K219 Gastro-esophageal reflux disease without esophagitis: Secondary | ICD-10-CM | POA: Diagnosis not present

## 2017-07-25 DIAGNOSIS — M545 Low back pain, unspecified: Secondary | ICD-10-CM

## 2017-07-25 DIAGNOSIS — E559 Vitamin D deficiency, unspecified: Secondary | ICD-10-CM

## 2017-07-25 DIAGNOSIS — E8881 Metabolic syndrome: Secondary | ICD-10-CM

## 2017-07-25 MED ORDER — LEVOTHYROXINE SODIUM 25 MCG PO TABS: ORAL_TABLET | ORAL | 2 refills | Status: DC

## 2017-07-25 MED ORDER — CYCLOBENZAPRINE HCL 5 MG PO TABS: 5.0000 mg | ORAL_TABLET | Freq: Every day | ORAL | 1 refills | Status: DC

## 2017-07-25 MED ORDER — TRULICITY 1.5 MG/0.5ML ~~LOC~~ SOAJ: 1.5000 mg | SUBCUTANEOUS | 2 refills | Status: DC

## 2017-07-25 NOTE — Telephone Encounter (Signed)
Refill request for general medication. B12- Historical Medication  Last office visit: 07/25/2017   Follow up on 10/22/2017

## 2017-07-25 NOTE — Progress Notes (Signed)
Name: Jessica [name]   MRN: [id number]    DOB: [birth year]   Date:07/25/2017       Progress Note  Subjective  Chief Complaint  Chief Complaint  Patient presents with  . Follow-up    3 months  . Gastroesophageal Reflux  . Hypothyroidism  . Anxiety    HPI  Obesity: she had bariatric surgery 03/2011, maximum weight prior to surgery was 270 lbs, she went down to 142 lbs and was stable for many years, however since she was diagnosed with bipolarin 2017and was placedon antipsychotic medicationsandgained weight, March 2018 weight was 183 Lbs. She was onLevothyroxine and Metformin andwent down to 165 lbs in May 2018 and we started her on Trulicity to help curb appetite and help with pre-diabetes/insulin resistance and she was  below her goal weight ( it was 140 lbs), down to 131 lbs and since last visit ( we stopped metformin) and  she is has been eating more and gained a few pounds.   Pre-diabetes:hgbA1C was back in 2013 was 6.3%, but down to 5.3 % with weight loss , she was gradually gaining weight after bariatric surgery and we started her on Metformin 08/2016 and also Synthroid forelevatedTSH and weight gain. She is on Trulicity and is doing well, avoiding sweets, no longer drinking sodas, tea or juice. She makes better meal choices and feels like the medication is working well for her.Having protein shakes daily, eating healthy. She has been off Metformin since end of 2018  B12 deficiency:getting monthly B12 and is doing well  Vitamin D deficiency: taking otc supplementation, but too high of a dose, advised to go down from 10000 units daily to 2000 units daily  HighTSH: associated with fatigue, and weight gain, started on Levothyroxine 08/2016 and is tolerating well, still tired, we will recheck TSH   Hodgkin's lymphoma: treated back in 2000, last WBC was normal, but the one prior to that was high, no night sweats, fever, CT scan abdomen showed gastrohepatic ligament  lymphadenopathy, she got a reminder from hematologist but has been busy with her mother ( she broke her spine and is having surgery)   Bipolar: seeing Dr. Maricela Curet, she still has a lot of anxiety, she has been sleeping well, going to be at 9 pm, but still getting up at 4 am, goes to work at 7 am. She states mood is stable, able to control anxiety well.   History of jejunal ulcer: seen by Dr. Durwin Reges , still on Carafate and PPI    Patient Active Problem List   Diagnosis Date Noted  . Cervical high risk HPV (human papillomavirus) test positive 04/08/2017  . Abdominal pain, epigastric   . Bariatric surgery status   . Ulcer jejunum   . Adenoma of left adrenal gland 02/18/2017  . Abnormal TSH 08/30/2016  . Bipolar disorder ([place]) 12/02/2015  . HAV (hallux abducto valgus) 11/22/2015  . Bunion of great toe 11/08/2015  . Attention-deficit hyperactivity disorder, other type 10/28/2015  . Sciatica 04/22/2015  . B12 deficiency 12/01/2014  . History of bariatric surgery 12/01/2014  . Anxiety, generalized 12/01/2014  . Gastro-esophageal reflux disease without esophagitis 12/01/2014  . H/O elevated lipids 12/01/2014  . H/O: HTN (hypertension) 12/01/2014  . H/O: obesity 12/01/2014  . Hodgkin's disease in remission ([place]) 12/01/2014  . Current tobacco use 12/01/2014  . Insomnia 03/24/2008  . Abnormal serum level of alkaline phosphatase 11/08/2006  . Chronic recurrent major depressive disorder ([place]) 11/08/2006    Past Surgical History:  Procedure  Laterality Date  . BUNIONECTOMY Right   . CARPAL TUNNEL RELEASE    . COLONOSCOPY     multiple  . ESOPHAGOGASTRODUODENOSCOPY (EGD) WITH PROPOFOL N/A 02/21/2017   Procedure: ESOPHAGOGASTRODUODENOSCOPY (EGD) WITH PROPOFOL;  Surgeon: Lucilla Lame, MD;  Location: [facility];  Service: Gastroenterology;  Laterality: N/A;  Pre-diabetic  . excision of neck mass  09/1998  . GASTRIC BYPASS  03/12/2011  . LAPAROSCOPIC SUPRACERVICAL HYSTERECTOMY  2008    and unilateral salpingoophorectomy/ endometriosis-Dr Kincius  . TONSILLECTOMY      Family History  Problem Relation Age of Onset  . Cancer Mother 62       colon  . Epilepsy Mother   . Glaucoma Father   . Breast cancer Maternal Grandmother 83       inflamitory breast ca    Social History   Socioeconomic History  . Marital status: Married    Spouse name: allen  . Number of children: 2  . Years of education: Not on file  . Highest education level: Associate degree: occupational, Hotel manager, or vocational program  Social Needs  . Financial resource strain: Not hard at all  . Food insecurity - worry: Never true  . Food insecurity - inability: Never true  . Transportation needs - medical: No  . Transportation needs - non-medical: No  Occupational History  . Occupation: Therapist, art  Tobacco Use  . Smoking status: Current Every Day Smoker    Packs/day: 0.50    Years: 14.00    Pack years: 7.00    Types: Cigarettes    Start date: 06/11/2000  . Smokeless tobacco: Never Used  Substance and Sexual Activity  . Alcohol use: No    Alcohol/week: 0.0 oz    Comment: rarely  . Drug use: No  . Sexual activity: Yes    Partners: Male    Birth control/protection: Surgical  Other Topics Concern  . Not on file  Social History Narrative  . Not on file     Current Outpatient Medications:  .  atorvastatin (LIPITOR) 40 MG tablet, Take 1 tablet (40 mg total) by mouth daily., Disp: 90 tablet, Rfl: 3 .  Cholecalciferol (VITAMIN D) 2000 units CAPS, Take 1 capsule (2,000 Units total) by mouth daily., Disp: 30 capsule, Rfl: 0 .  clonazePAM (KLONOPIN) 0.5 MG tablet, Take 1 tablet (0.5 mg total) by mouth at bedtime., Disp: 30 tablet, Rfl: 2 .  cyanocobalamin (,VITAMIN B-12,) 1000 MCG/ML injection, , Disp: , Rfl: 5 .  cyclobenzaprine (FLEXERIL) 5 MG tablet, Take 1 tablet (5 mg total) by mouth at bedtime. for muscle spams, Disp: 90 tablet, Rfl: 1 .  DULoxetine (CYMBALTA) 30 MG capsule,  Take 1 capsule (30 mg total) by mouth daily., Disp: 30 capsule, Rfl: 2 .  estradiol (ESTRACE VAGINAL) 0.1 MG/GM vaginal cream, Insert 1 gm vaginally qhs x 2 weeks then decrease to 1GM 3 times/week, Disp: 42.5 g, Rfl: 1 .  levothyroxine (SYNTHROID, LEVOTHROID) 25 MCG tablet, TAKE 1 TABLET BY MOUTH DAILY BEFORE BREAKFAST., Disp: 30 tablet, Rfl: 2 .  lithium carbonate (LITHOBID) 300 MG CR tablet, Take 2 tablets (600 mg total) by mouth at bedtime., Disp: 60 tablet, Rfl: 3 .  Multiple Vitamins-Minerals (MULTI-VITAMIN GUMMIES) CHEW, Chew 2 capsules by mouth daily., Disp: 30 tablet, Rfl: 0 .  omeprazole (PRILOSEC) 20 MG capsule, Take 20 mg by mouth daily., Disp: , Rfl:  .  QUEtiapine (SEROQUEL) 50 MG tablet, Take 1 tablet (50 mg total) by mouth at bedtime., Disp: 30 tablet, Rfl:  2 .  SF 5000 PLUS 1.1 % CREA dental cream, , Disp: , Rfl: 3 .  sucralfate (CARAFATE) 1 g tablet, Take 1 tablet (1 g total) by mouth 4 (four) times daily., Disp: 120 tablet, Rfl: 1 .  TRULICITY 1.5 GY/6.9SW SOPN, Inject 1.5 mg into the skin once a week., Disp: 4 pen, Rfl: 2 .  Cyanocobalamin (VITAMIN B-12 IJ), Inject as directed every 30 (thirty) days., Disp: , Rfl:   Allergies  Allergen Reactions  . Vicodin [Hydrocodone-Acetaminophen] Other (See Comments)    Has really bad headaches/ migraine       ROS  Constitutional: Negative for fever or weight change.  Respiratory: Negative for cough and shortness of breath.   Cardiovascular: Negative for chest pain or palpitations.  Gastrointestinal: Negative for abdominal pain, no bowel changes.  Musculoskeletal: Negative for gait problem or joint swelling.  Skin: Negative for rash.  Neurological: Negative for dizziness or headache.  No other specific complaints in a complete review of systems (except as listed in HPI above).  Objective  Vitals:   07/25/17 1526  BP: 112/62  Resp: 16  Temp: 98.3 F (36.8 C)  TempSrc: Oral  SpO2: 98%  Weight: 135 lb 1.6 oz (61.3 kg)     Body mass index is 22.48 kg/m.  Physical Exam  Constitutional: Patient appears well-developed and well-nourished.  No distress.  HEENT: head atraumatic, normocephalic, pupils equal and reactive to light,neck supple, throat within normal limits Cardiovascular: Mild tachycardia,  regular rhythm and normal heart sounds.  No murmur heard. No BLE edema. Pulmonary/Chest: Effort normal and breath sounds normal. No respiratory distress. Abdominal: Soft.  There is no tenderness. Psychiatric: Patient has a normal mood and affect. behavior is normal. Judgment and thought content normal.   PHQ2/9: Depression screen [facility] 2/9 07/25/2017 04/24/2017 02/19/2017 08/28/2016 12/02/2015  Decreased Interest 0 0 0 0 0  Down, Depressed, Hopeless 0 0 0 0 1  PHQ - 2 Score 0 0 0 0 1  Altered sleeping - - - - 0  Tired, decreased energy - - - - 0  Change in appetite - - - - 0  Feeling bad or failure about yourself  - - - - 1  Trouble concentrating - - - - -  Moving slowly or fidgety/restless - - - - 1  Suicidal thoughts - - - - 0  PHQ-9 Score - - - - 3  Difficult doing work/chores - - - - -    Fall Risk: Fall Risk  07/25/2017 04/24/2017 02/19/2017 08/28/2016 12/02/2015  Falls in the past year? No No No No No    Functional Status Survey: Is the patient deaf or have difficulty hearing?: No Does the patient have difficulty seeing, even when wearing glasses/contacts?: Yes Does the patient have difficulty concentrating, remembering, or making decisions?: No Does the patient have difficulty walking or climbing stairs?: No Does the patient have difficulty dressing or bathing?: No Does the patient have difficulty doing errands alone such as visiting a doctor's office or shopping?: No  Assessment & Plan  1. Prediabetes  - TRULICITY 1.5 NI/6.2VO SOPN; Inject 1.5 mg into the skin once a week.  Dispense: 4 pen; Refill: 2  2. B12 deficiency  - Vitamin B12  3. Hodgkin's disease in remission [facility])  Needs to  see hematologist [place]   4. History of bariatric surgery   5. Atherosclerosis of abdominal aorta ([facility])  - Lipid panel  6. Other specified hypothyroidism  - levothyroxine (SYNTHROID, LEVOTHROID) 25 MCG tablet;  TAKE 1 TABLET BY MOUTH DAILY BEFORE BREAKFAST.  Dispense: 30 tablet; Refill: 2 - TSH  7. Gastro-esophageal reflux disease without esophagitis  Doing well with otc prilosec  8. Recurrent low back pain  - cyclobenzaprine (FLEXERIL) 5 MG tablet; Take 1 tablet (5 mg total) by mouth at bedtime. for muscle spams  Dispense: 90 tablet; Refill: 1  9. Insulin resistance  - TRULICITY 1.5 VI/1.5PP SOPN; Inject 1.5 mg into the skin once a week.  Dispense: 4 pen; Refill: 2  hgbA1C  10. Vitamin D deficiency  - VITAMIN D 25 Hydroxy (Vit-D Deficiency, Fractures)

## 2017-07-26 ENCOUNTER — Ambulatory Visit: Payer: Self-pay | Admitting: Family Medicine

## 2017-08-08 ENCOUNTER — Other Ambulatory Visit
Admission: RE | Admit: 2017-08-08 | Discharge: 2017-08-08 | Disposition: A | Discharge status: Home / Self Care | Payer: 59 | Source: Ambulatory Visit | Attending: Family Medicine | Admitting: Family Medicine

## 2017-08-08 DIAGNOSIS — E038 Other specified hypothyroidism: Secondary | ICD-10-CM | POA: Insufficient documentation

## 2017-08-08 LAB — LIPID PANEL
CHOL/HDL RATIO: 2.5 ratio
Cholesterol: 108 mg/dL (ref 0–200)
HDL: 44 mg/dL (ref >40)
LDL Cholesterol: 48 mg/dL (ref 0–99)
Triglycerides: 82 mg/dL (ref <150)
VLDL: 16 mg/dL (ref 0–40)

## 2017-08-08 LAB — HEMOGLOBIN A1C
Hgb A1c MFr Bld: 5.3 % (ref 4.8–5.6)
Mean Plasma Glucose: 105.41 mg/dL

## 2017-08-08 LAB — VITAMIN B12: VITAMIN B 12: 580 pg/mL (ref 180–914)

## 2017-08-08 LAB — TSH: TSH: 0.498 u[IU]/mL (ref 0.350–4.500)

## 2017-08-09 LAB — VITAMIN D 25 HYDROXY (VIT D DEFICIENCY, FRACTURES): VIT D 25 HYDROXY: 51.3 ng/mL (ref 30.0–100.0)

## 2017-08-12 ENCOUNTER — Encounter: Payer: Self-pay | Admitting: Psychiatry

## 2017-08-12 ENCOUNTER — Ambulatory Visit (INDEPENDENT_AMBULATORY_CARE_PROVIDER_SITE_OTHER): Payer: 59 | Admitting: Psychiatry

## 2017-08-12 DIAGNOSIS — F39 Unspecified mood [affective] disorder: Secondary | ICD-10-CM

## 2017-08-12 DIAGNOSIS — F411 Generalized anxiety disorder: Secondary | ICD-10-CM

## 2017-08-12 MED ORDER — CLONAZEPAM 0.5 MG PO TABS: 0.5000 mg | ORAL_TABLET | Freq: Every day | ORAL | 2 refills | Status: DC

## 2017-08-12 MED ORDER — QUETIAPINE FUMARATE 50 MG PO TABS: 50.0000 mg | ORAL_TABLET | Freq: Every day | ORAL | 2 refills | Status: DC

## 2017-08-12 MED ORDER — LITHIUM CARBONATE ER 300 MG PO TBCR: 600.0000 mg | EXTENDED_RELEASE_TABLET | Freq: Every day | ORAL | 3 refills | Status: DC

## 2017-08-12 MED ORDER — DULOXETINE HCL 30 MG PO CPEP: 30.0000 mg | ORAL_CAPSULE | Freq: Every day | ORAL | 2 refills | Status: DC

## 2017-08-12 NOTE — Progress Notes (Signed)
Psychiatric MD Progress Note   Patient Identification: Jessica [name] MRN:  [id number] Date of Evaluation:  08/12/2017 Referral Source: PCP Chief Complaint:    Visit Diagnosis:    ICD-10-CM   1. Episodic mood disorder ([place]) F39   2. Anxiety, generalized F41.1     History of Present Illness:    Patient is a 52 -year-old married female with history of mood symptoms presented for follow-up. She reported that she has found a new job and will be starting in the next 2 weeks. She was very excited about the same. She was discussing in detail about the relationship issues with her mother. She reported that she has started her relationship with her sister with whom she has not been talking for the past 9 years. She reported that her mother creates relationship problems with feeling them. She reported that she wants to keep her friendship with her sister. Patient reported that her medications are helping her. She will be starting going to the new pharmacy as her job will change in the next 2 weeks. She currently denied having any side effects of her medications. She denied having any suicidal homicidal ideations or plans. She appeared calm and alert during the interview. She takes Klonopin as prescribed. Does not have any acute issues at this time.         Associated Signs/Symptoms: Depression Symptoms:  fatigue, anxiety, (Hypo) Manic Symptoms:  Labiality of Mood, Anxiety Symptoms:  Excessive Worry, Panic Symptoms, Psychotic Symptoms:  none PTSD Symptoms: Negative NA  Past Psychiatric History:  No h/o admission No h/o SA    Previous Psychotropic Medications:  Prozac Paxil Effexor- worked best, increased LFT Cymbalta  seroquel xanax  Substance Abuse History in the last 12 months:  Yes.    Social drinker   Consequences of Substance Abuse: Negative NA  Past Medical History:  Past Medical History:  Diagnosis Date  . ADHD (attention deficit hyperactivity disorder)   .  Anxiety   . Bipolar 1 disorder ([place])   . Cancer ([place])    Hodgkin's Lymphoma 09/1998  . Depression   . Endometriosis   . GERD (gastroesophageal reflux disease)   . History of gastric bypass 2012  . Hodgkin's lymphoma ([place]) 2000  . Hypertension    resolved with weight loss  . Hypothyroidism   . Pre-diabetes   . Tobacco use disorder   . Ulcer jejunum     Past Surgical History:  Procedure Laterality Date  . BUNIONECTOMY Right   . CARPAL TUNNEL RELEASE    . COLONOSCOPY     multiple  . ESOPHAGOGASTRODUODENOSCOPY (EGD) WITH PROPOFOL N/A 02/21/2017   Procedure: ESOPHAGOGASTRODUODENOSCOPY (EGD) WITH PROPOFOL;  Surgeon: Lucilla Lame, MD;  Location: [facility];  Service: Gastroenterology;  Laterality: N/A;  Pre-diabetic  . excision of neck mass  09/1998  . GASTRIC BYPASS  03/12/2011  . LAPAROSCOPIC SUPRACERVICAL HYSTERECTOMY  2008   and unilateral salpingoophorectomy/ endometriosis-Dr Kincius  . TONSILLECTOMY      Family Psychiatric History:  She denied any family history of psychiatric illness   Family History:  Family History  Problem Relation Age of Onset  . Cancer Mother 50       colon  . Epilepsy Mother   . Glaucoma Father   . Breast cancer Maternal Grandmother 83       inflamitory breast ca    Social History:   Social History   Socioeconomic History  . Marital status: Married    Spouse name: allen  .  Number of children: 2  . Years of education: Not on file  . Highest education level: Associate degree: occupational, Hotel manager, or vocational program  Social Needs  . Financial resource strain: Not hard at all  . Food insecurity - worry: Never true  . Food insecurity - inability: Never true  . Transportation needs - medical: No  . Transportation needs - non-medical: No  Occupational History  . Occupation: Therapist, art  Tobacco Use  . Smoking status: Current Every Day Smoker    Packs/day: 0.50    Years: 14.00    Pack years: 7.00    Types:  Cigarettes    Start date: 06/11/2000  . Smokeless tobacco: Never Used  Substance and Sexual Activity  . Alcohol use: No    Alcohol/week: 0.0 oz    Comment: rarely  . Drug use: No  . Sexual activity: Yes    Partners: Male    Birth control/protection: Surgical  Other Topics Concern  . Not on file  Social History Narrative  . Not on file    Additional Social History:  Married x 17 years for first times.  Has children - 2 , 38 and 23  Now married x 9 years - Has good relationship with husband   Allergies:   Allergies  Allergen Reactions  . Vicodin [Hydrocodone-Acetaminophen] Other (See Comments)    Has really bad headaches/ migraine      Metabolic Disorder Labs: Lab Results  Component Value Date   HGBA1C 5.3 08/08/2017   MPG 105.41 08/08/2017   MPG 105 08/29/2016   No results found for: PROLACTIN Lab Results  Component Value Date   CHOL 108 08/08/2017   TRIG 82 08/08/2017   HDL 44 08/08/2017   CHOLHDL 2.5 08/08/2017   VLDL 16 08/08/2017   LDLCALC 48 08/08/2017   LDLCALC 104 (H) 08/29/2016     Current Medications: Current Outpatient Medications  Medication Sig Dispense Refill  . atorvastatin (LIPITOR) 40 MG tablet Take 1 tablet (40 mg total) by mouth daily. 90 tablet 3  . Cholecalciferol (VITAMIN D) 2000 units CAPS Take 1 capsule (2,000 Units total) by mouth daily. 30 capsule 0  . clonazePAM (KLONOPIN) 0.5 MG tablet Take 1 tablet (0.5 mg total) by mouth at bedtime. 30 tablet 2  . cyanocobalamin (,VITAMIN B-12,) 1000 MCG/ML injection   5  . Cyanocobalamin (VITAMIN B-12 IJ) Inject as directed every 30 (thirty) days.    . cyclobenzaprine (FLEXERIL) 5 MG tablet Take 1 tablet (5 mg total) by mouth at bedtime. for muscle spams 90 tablet 1  . DULoxetine (CYMBALTA) 30 MG capsule Take 1 capsule (30 mg total) by mouth daily. 30 capsule 2  . estradiol (ESTRACE VAGINAL) 0.1 MG/GM vaginal cream Insert 1 gm vaginally qhs x 2 weeks then decrease to 1GM 3 times/week 42.5 g 1  .  levothyroxine (SYNTHROID, LEVOTHROID) 25 MCG tablet TAKE 1 TABLET BY MOUTH DAILY BEFORE BREAKFAST. 30 tablet 2  . lithium carbonate (LITHOBID) 300 MG CR tablet Take 2 tablets (600 mg total) by mouth at bedtime. 60 tablet 3  . Multiple Vitamins-Minerals (MULTI-VITAMIN GUMMIES) CHEW Chew 2 capsules by mouth daily. 30 tablet 0  . omeprazole (PRILOSEC) 20 MG capsule Take 20 mg by mouth daily.    . QUEtiapine (SEROQUEL) 50 MG tablet Take 1 tablet (50 mg total) by mouth at bedtime. 30 tablet 2  . SF 5000 PLUS 1.1 % CREA dental cream   3  . sucralfate (CARAFATE) 1 g tablet Take 1 tablet (1  g total) by mouth 4 (four) times daily. 120 tablet 1  . TRULICITY 1.5 QQ/5.9DG SOPN Inject 1.5 mg into the skin once a week. 4 pen 2   No current facility-administered medications for this visit.     Neurologic: Headache: No Seizure: No Paresthesias:No  Musculoskeletal: Strength & Muscle Tone: within normal limits Gait & Station: normal Patient leans: N/A  Psychiatric Specialty Exam: Review of Systems  Musculoskeletal: Positive for joint pain.  Psychiatric/Behavioral: Positive for depression. The patient is nervous/anxious.     There were no vitals taken for this visit.There is no height or weight on file to calculate BMI.  General Appearance: Well Groomed  Eye Contact:  Fair  Speech:  Normal Rate  Volume:  Normal  Mood:  Anxious  Affect:  Depressed and Labile  Thought Process:  Coherent  Orientation:  Full (Time, Place, and Person)  Thought Content:  WDL  Suicidal Thoughts:  No  Homicidal Thoughts:  No  Memory:  Immediate;   Fair Recent;   Fair Remote;   Fair  Judgement:  Good  Insight:  Fair  Psychomotor Activity:  Increased  Concentration:  Concentration: Fair and Attention Span: Fair  Recall:  [employer] of Knowledge:Fair  Language: Fair  Akathisia:  No  Handed:  Right  AIMS (if indicated):    Assets:  Communication Skills Desire for Improvement Physical Health Social  Support Transportation  ADL's:  Intact  Cognition: WNL  Sleep:      Treatment Plan Summary: Medication management   Discussed with patient at length about the medications treatment risks benefits and alternatives  I have refilled her medications. Patient will call for the refill of her medications at the new pharmacy as she will be taking her medications to the new pharmacy when she will start her new jobs in 2 weeks.  I have refilled her medications as follows    Continue Cymbalta 30 mg daily.  Seroquel 50 mg by mouth daily at bedtime .  Klonopin 0.5 mg by mouth daily at bedtime  Continue lithium carbonate 600 mg at bedtime to help with the mood symptoms.  Follow-up in  4 months or earlier depending on her symptoms    More than 50% of the time spent in psychoeducation, counseling and coordination of care.    This note was generated in part or whole with voice recognition software. Voice regonition is usually quite accurate but there are transcription errors that can and very often do occur. I apologize for any typographical errors that were not detected and corrected.    Rainey Pines, MD 3/4/20194:11 PM

## 2017-09-02 DIAGNOSIS — D2261 Melanocytic nevi of right upper limb, including shoulder: Secondary | ICD-10-CM | POA: Diagnosis not present

## 2017-09-02 DIAGNOSIS — D2272 Melanocytic nevi of left lower limb, including hip: Secondary | ICD-10-CM | POA: Diagnosis not present

## 2017-09-02 DIAGNOSIS — B351 Tinea unguium: Secondary | ICD-10-CM | POA: Diagnosis not present

## 2017-09-02 DIAGNOSIS — L700 Acne vulgaris: Secondary | ICD-10-CM | POA: Diagnosis not present

## 2017-10-22 ENCOUNTER — Ambulatory Visit: Payer: Self-pay | Admitting: Family Medicine

## 2017-11-08 ENCOUNTER — Other Ambulatory Visit: Payer: Self-pay | Admitting: Family Medicine

## 2017-11-08 DIAGNOSIS — E038 Other specified hypothyroidism: Secondary | ICD-10-CM

## 2017-11-12 ENCOUNTER — Other Ambulatory Visit: Payer: Self-pay | Admitting: Psychiatry

## 2017-11-18 NOTE — Telephone Encounter (Signed)
Disp Refills Start End   QUEtiapine (SEROQUEL) 50 MG tablet 30 tablet 2 08/12/2017    Sig - Route: Take 1 tablet (50 mg total) by mouth at bedtime. - Oral   Sent to pharmacy as: QUEtiapine (SEROQUEL) 50 MG tablet   E-Prescribing Status: Receipt confirmed by pharmacy (08/12/2017 4:04 PM EST)    Pt needs refill on seroquel pt has an appt for 12-16-17 but will not have enough medication to last until appt.

## 2017-11-25 ENCOUNTER — Other Ambulatory Visit: Payer: Self-pay | Admitting: Psychiatry

## 2017-12-16 ENCOUNTER — Ambulatory Visit: Payer: Self-pay | Admitting: Family Medicine

## 2017-12-16 ENCOUNTER — Other Ambulatory Visit: Payer: Self-pay

## 2017-12-16 ENCOUNTER — Encounter: Payer: Self-pay | Admitting: Family Medicine

## 2017-12-16 ENCOUNTER — Ambulatory Visit: Payer: 59 | Admitting: Psychiatry

## 2017-12-16 ENCOUNTER — Encounter: Payer: Self-pay | Admitting: Psychiatry

## 2017-12-16 VITALS — BP 136/84 | HR 99 | Temp 98.6°F | Resp 16 | Ht 65.0 in | Wt 129.8 lb

## 2017-12-16 VITALS — BP 128/81 | HR 106 | Wt 130.2 lb

## 2017-12-16 DIAGNOSIS — F411 Generalized anxiety disorder: Secondary | ICD-10-CM | POA: Diagnosis not present

## 2017-12-16 DIAGNOSIS — F39 Unspecified mood [affective] disorder: Secondary | ICD-10-CM | POA: Diagnosis not present

## 2017-12-16 DIAGNOSIS — C8190 Hodgkin lymphoma, unspecified, unspecified site: Secondary | ICD-10-CM

## 2017-12-16 DIAGNOSIS — R7303 Prediabetes: Secondary | ICD-10-CM

## 2017-12-16 DIAGNOSIS — E559 Vitamin D deficiency, unspecified: Secondary | ICD-10-CM

## 2017-12-16 DIAGNOSIS — E8881 Metabolic syndrome: Secondary | ICD-10-CM

## 2017-12-16 DIAGNOSIS — F339 Major depressive disorder, recurrent, unspecified: Secondary | ICD-10-CM

## 2017-12-16 DIAGNOSIS — K219 Gastro-esophageal reflux disease without esophagitis: Secondary | ICD-10-CM

## 2017-12-16 DIAGNOSIS — E038 Other specified hypothyroidism: Secondary | ICD-10-CM

## 2017-12-16 DIAGNOSIS — F3162 Bipolar disorder, current episode mixed, moderate: Secondary | ICD-10-CM

## 2017-12-16 MED ORDER — CLONAZEPAM 0.5 MG PO TABS: 0.5000 mg | ORAL_TABLET | Freq: Every day | ORAL | 5 refills | Status: DC

## 2017-12-16 MED ORDER — DULOXETINE HCL 30 MG PO CPEP: 30.0000 mg | ORAL_CAPSULE | Freq: Every day | ORAL | 2 refills | Status: DC

## 2017-12-16 MED ORDER — LITHIUM CARBONATE ER 300 MG PO TBCR: 600.0000 mg | EXTENDED_RELEASE_TABLET | Freq: Every day | ORAL | 3 refills | Status: DC

## 2017-12-16 MED ORDER — TRULICITY 1.5 MG/0.5ML ~~LOC~~ SOAJ: 1.5000 mg | SUBCUTANEOUS | 5 refills | Status: DC

## 2017-12-16 MED ORDER — QUETIAPINE FUMARATE 50 MG PO TABS: 50.0000 mg | ORAL_TABLET | Freq: Every day | ORAL | 1 refills | Status: DC

## 2017-12-16 NOTE — Progress Notes (Signed)
Name: Jessica [name]   MRN: [id number]    DOB: 1965-07-03   Date:12/16/2017       Progress Note  Subjective  Chief Complaint  Chief Complaint  Patient presents with  . Follow-up    patient is here for her 3 month f/u  . Obesity  . Prediabetes  . Hypothyroidism  . Manic Behavior  . Vitamin B deficiency  . Vitamin D deficiency    HPI  History of obesity: she had bariatric surgery 03/2011, maximum weight prior to surgery was 270 lbs, she went down to 142 lbs and was stable for many years, however since she was diagnosed with bipolarin 2017and was placedon antipsychotic medicationsandgained weight, March 2018 weight was 183 Lbs. She was onLevothyroxine and Metformin andwent down to 165 lbs in May 2018 and we started her on Trulicity to help curb appetite and help with pre-diabetes/insulin resistanceand she was  below her goal weight ( it was 140 lbs), down to 129 lbs today. She is off metformin, eating multiple small meals per day, and states her weight has been stable in the past few months.   Pre-diabetes:hgbA1C was back in 2013 was 6.3%, but down to 5.3 % with weight loss , she was gradually gaining weight after bariatric surgery and we started her on Metformin 08/2016 and also Synthroid forelevatedTSH . She makes better meal choices and feels like the medication is working well for her.Having protein shakes daily, eating healthy. She has been off Metformin since end of 2018. She is now trying to add calories to avoid losing more weight.   B12 deficiency:getting monthly B12 and is doing well. Unchanged   Vitamin D deficiency: taking otc supplementation, but too high of a dose, she is currently on 3000 units daily, go down to 1000 units daily   HighTSH: associated with fatigue, and weight gain, started on Levothyroxine 08/2016 and is tolerating well, still tired, last TSH was at goal, recheck today because of weight loss.   Hodgkin's lymphoma: treated back in 2000, last  WBC was normal, but the one prior to that was high, no night sweats, fever,CT scan abdomen showed gastrohepatic ligament lymphadenopathy, she got a reminder from hematologist Explained again importance of going back for follow up  Bipolar: seeing Dr. Maricela Curet, she still has a lot of anxiety, she has been sleeping well,going to be at 9 pm, but still getting up at 4 am, goes to work at 7 am. She states mood is stable, able to control anxiety well. She only takes lithium occasionally, but compliant with seroquel    Patient Active Problem List   Diagnosis Date Noted  . Cervical high risk HPV (human papillomavirus) test positive 04/08/2017  . Abdominal pain, epigastric   . Bariatric surgery status   . Ulcer jejunum   . Adenoma of left adrenal gland 02/18/2017  . Abnormal TSH 08/30/2016  . Bipolar disorder ([place]) 12/02/2015  . HAV (hallux abducto valgus) 11/22/2015  . Bunion of great toe 11/08/2015  . Attention-deficit hyperactivity disorder, other type 10/28/2015  . Sciatica 04/22/2015  . B12 deficiency 12/01/2014  . History of bariatric surgery 12/01/2014  . Anxiety, generalized 12/01/2014  . Gastro-esophageal reflux disease without esophagitis 12/01/2014  . H/O elevated lipids 12/01/2014  . H/O: HTN (hypertension) 12/01/2014  . H/O: obesity 12/01/2014  . Hodgkin's disease in remission ([place]) 12/01/2014  . Current tobacco use 12/01/2014  . Insomnia 03/24/2008  . Abnormal serum level of alkaline phosphatase 11/08/2006  . Chronic recurrent major depressive disorder (  [place]) 11/08/2006    Past Surgical History:  Procedure Laterality Date  . BUNIONECTOMY Right   . CARPAL TUNNEL RELEASE    . COLONOSCOPY     multiple  . ESOPHAGOGASTRODUODENOSCOPY (EGD) WITH PROPOFOL N/A 02/21/2017   Procedure: ESOPHAGOGASTRODUODENOSCOPY (EGD) WITH PROPOFOL;  Surgeon: Lucilla Lame, MD;  Location: [facility];  Service: Gastroenterology;  Laterality: N/A;  Pre-diabetic  . excision of neck mass  09/1998   . GASTRIC BYPASS  03/12/2011  . LAPAROSCOPIC SUPRACERVICAL HYSTERECTOMY  2008   and unilateral salpingoophorectomy/ endometriosis-Dr Kincius  . TONSILLECTOMY      Family History  Problem Relation Age of Onset  . Cancer Mother 67       colon  . Epilepsy Mother   . Glaucoma Father   . Breast cancer Maternal Grandmother 83       inflamitory breast ca    Social History   Socioeconomic History  . Marital status: Married    Spouse name: allen  . Number of children: 2  . Years of education: Not on file  . Highest education level: Associate degree: occupational, Hotel manager, or vocational program  Occupational History  . Occupation: Administrator: [employer]  Social Needs  . Financial resource strain: Not hard at all  . Food insecurity:    Worry: Never true    Inability: Never true  . Transportation needs:    Medical: No    Non-medical: No  Tobacco Use  . Smoking status: Current Every Day Smoker    Packs/day: 0.25    Years: 15.00    Pack years: 3.75    Types: Cigarettes    Start date: 06/11/2000  . Smokeless tobacco: Never Used  Substance and Sexual Activity  . Alcohol use: No    Alcohol/week: 0.0 oz    Comment: rarely  . Drug use: No  . Sexual activity: Yes    Partners: Male    Birth control/protection: Surgical  Lifestyle  . Physical activity:    Days per week: 5 days    Minutes per session: 60 min  . Stress: Rather much  Relationships  . Social connections:    Talks on phone: Three times a week    Gets together: Once a week    Attends religious service: More than 4 times per year    Active member of club or organization: No    Attends meetings of clubs or organizations: Never    Relationship status: Married  . Intimate partner violence:    Fear of current or ex partner: No    Emotionally abused: No    Physically abused: No    Forced sexual activity: No  Other Topics Concern  . Not on file  Social History Narrative  .  Not on file     Current Outpatient Medications:  .  atorvastatin (LIPITOR) 40 MG tablet, Take 1 tablet (40 mg total) by mouth daily., Disp: 90 tablet, Rfl: 3 .  Cholecalciferol (VITAMIN D) 2000 units CAPS, Take 1 capsule (2,000 Units total) by mouth daily., Disp: 30 capsule, Rfl: 0 .  clonazePAM (KLONOPIN) 0.5 MG tablet, Take 1 tablet (0.5 mg total) by mouth at bedtime., Disp: 30 tablet, Rfl: 5 .  cyanocobalamin (,VITAMIN B-12,) 1000 MCG/ML injection, , Disp: , Rfl: 5 .  cyclobenzaprine (FLEXERIL) 5 MG tablet, Take 1 tablet (5 mg total) by mouth at bedtime. for muscle spams, Disp: 90 tablet, Rfl: 1 .  DULoxetine (CYMBALTA) 30 MG capsule, Take 1 capsule (  30 mg total) by mouth daily., Disp: 90 capsule, Rfl: 2 .  estradiol (ESTRACE VAGINAL) 0.1 MG/GM vaginal cream, Insert 1 gm vaginally qhs x 2 weeks then decrease to 1GM 3 times/week, Disp: 42.5 g, Rfl: 1 .  levothyroxine (SYNTHROID, LEVOTHROID) 25 MCG tablet, TAKE 1 TABLET BY MOUTH DAILY BEFORE BREAKFAST, Disp: 90 tablet, Rfl: 0 .  lithium carbonate (LITHOBID) 300 MG CR tablet, Take 2 tablets (600 mg total) by mouth at bedtime., Disp: 180 tablet, Rfl: 3 .  Multiple Vitamins-Minerals (MULTI-VITAMIN GUMMIES) CHEW, Chew 2 capsules by mouth daily., Disp: 30 tablet, Rfl: 0 .  omeprazole (PRILOSEC) 20 MG capsule, Take 20 mg by mouth daily., Disp: , Rfl:  .  QUEtiapine (SEROQUEL) 50 MG tablet, Take 1 tablet (50 mg total) by mouth at bedtime., Disp: 90 tablet, Rfl: 1 .  SF 5000 PLUS 1.1 % CREA dental cream, , Disp: , Rfl: 3 .  sucralfate (CARAFATE) 1 g tablet, Take 1 tablet (1 g total) by mouth 4 (four) times daily., Disp: 120 tablet, Rfl: 1 .  TRULICITY 1.5 SH/7.51YO SOPN, Inject 1.5 mg into the skin once a week., Disp: 4 pen, Rfl: 2  Allergies  Allergen Reactions  . Vicodin [Hydrocodone-Acetaminophen] Other (See Comments)    Has really bad headaches/ migraine       ROS  Constitutional: Negative for fever, positive for mild weight change.   Respiratory: Negative for cough and shortness of breath.   Cardiovascular: Negative for chest pain or palpitations.  Gastrointestinal: Negative for abdominal pain, no bowel changes.  Musculoskeletal: Negative for gait problem or joint swelling.  Skin: Negative for rash.  Neurological: Negative for dizziness or headache.  No other specific complaints in a complete review of systems (except as listed in HPI above).  Objective  Vitals:   12/16/17 1546  BP: 136/84  Pulse: 99  Resp: 16  Temp: 98.6 F (37 C)  TempSrc: Oral  SpO2: 98%  Weight: 129 lb 12.8 oz (58.9 kg)  Height: 5\' 5"  (1.651 m)    Body mass index is 21.6 kg/m.  Physical Exam  Constitutional: Patient appears well-developed and well-nourished.  No distress.  HEENT: head atraumatic, normocephalic, pupils equal and reactive to light,  neck supple, throat within normal limits Cardiovascular: Normal rate, regular rhythm and normal heart sounds.  No murmur heard. No BLE edema. Pulmonary/Chest: Effort normal and breath sounds normal. No respiratory distress. Abdominal: Soft.  There is mild epigastric  tenderness. Psychiatric: Patient has a normal mood and affect. behavior is normal. Judgment and thought content normal.  PHQ2/9: Depression screen [facility] 2/9 12/16/2017 07/25/2017 04/24/2017 02/19/2017 08/28/2016  Decreased Interest 0 0 0 0 0  Down, Depressed, Hopeless 0 0 0 0 0  PHQ - 2 Score 0 0 0 0 0  Altered sleeping 0 - - - -  Tired, decreased energy 0 - - - -  Change in appetite 0 - - - -  Feeling bad or failure about yourself  0 - - - -  Trouble concentrating 0 - - - -  Moving slowly or fidgety/restless 0 - - - -  Suicidal thoughts 0 - - - -  PHQ-9 Score 0 - - - -  Difficult doing work/chores - - - - -     Fall Risk: Fall Risk  12/16/2017 07/25/2017 04/24/2017 02/19/2017 08/28/2016  Falls in the past year? No No No No No     Functional Status Survey: Is the patient deaf or have difficulty hearing?: No Does the  patient have difficulty seeing, even when wearing glasses/contacts?: No Does the patient have difficulty concentrating, remembering, or making decisions?: No Does the patient have difficulty walking or climbing stairs?: No Does the patient have difficulty dressing or bathing?: No Does the patient have difficulty doing errands alone such as visiting a doctor's office or shopping?: No    Assessment & Plan  1. Prediabetes  - TRULICITY 1.5 SP/2.3RA SOPN; Inject 1.5 mg into the skin once a week.  Dispense: 4 pen; Refill: 5  2. Insulin resistance  - TRULICITY 1.5 QT/6.2UQ SOPN; Inject 1.5 mg into the skin once a week.  Dispense: 4 pen; Refill: 5  3. Other specified hypothyroidism  - TSH  4. Gastro-esophageal reflux disease without esophagitis  Doing well at this time  5. Vitamin D deficiency  Continue vitamin D supplementation   6. Chronic recurrent major depressive disorder ([place])   7. Bipolar disorder, current episode mixed, moderate ([place])  Doing well on medication  8. Hodgkin's disease in remission [facility])  - Ambulatory referral to Hematology

## 2017-12-16 NOTE — Progress Notes (Signed)
Psychiatric MD Progress Note   Patient Identification: Jessica [name] MRN:  [id number] Date of Evaluation:  12/16/2017 Referral Source: PCP Chief Complaint:   Chief Complaint    Follow-up; Medication Refill     Visit Diagnosis:    ICD-10-CM   1. Episodic mood disorder ([place]) F39   2. Anxiety, generalized F41.1     History of Present Illness:    Patient is a 53 -year-old married female with history of mood symptoms presented for follow-up. She reported that she has a new job at [employer] and has a started since April.  She was very excited and reported tha she feels more relaxed.  She reported that she is able to complete her work in a timely fashion.  Patient reported that her medications has been helpful and she is not having any side effects of the medication.  Patient reported that she has good relationship with her family members as well.  She denied having any side effects of medication.  She sleeps well with the help of Seroquel and Klonopin.  Patient reported that she is maintaining her weight and is not trying to lose weight at this time.  We discussed about her medications in detail.  She is going to follow up with her primary care physician about her lab work.  Patient reported that she has time for her hobbies on a daily basis.  She appears happy during the interview.     She denied having any suicidal homicidal ideations or plans.       Associated Signs/Symptoms: Depression Symptoms:  anxiety, (Hypo) Manic Symptoms:  Labiality of Mood, Anxiety Symptoms:  none Psychotic Symptoms:  none PTSD Symptoms: Negative NA  Past Psychiatric History:  No h/o admission No h/o SA    Previous Psychotropic Medications:  Prozac Paxil Effexor- worked best, increased LFT Cymbalta  seroquel xanax  Substance Abuse History in the last 12 months:  Yes.    Social drinker   Consequences of Substance Abuse: Negative NA  Past Medical History:  Past Medical History:   Diagnosis Date  . ADHD (attention deficit hyperactivity disorder)   . Anxiety   . Bipolar 1 disorder ([place])   . Cancer ([place])    Hodgkin's Lymphoma 09/1998  . Depression   . Endometriosis   . GERD (gastroesophageal reflux disease)   . History of gastric bypass 2012  . Hodgkin's lymphoma ([place]) 2000  . Hypertension    resolved with weight loss  . Hypothyroidism   . Pre-diabetes   . Tobacco use disorder   . Ulcer jejunum     Past Surgical History:  Procedure Laterality Date  . BUNIONECTOMY Right   . CARPAL TUNNEL RELEASE    . COLONOSCOPY     multiple  . ESOPHAGOGASTRODUODENOSCOPY (EGD) WITH PROPOFOL N/A 02/21/2017   Procedure: ESOPHAGOGASTRODUODENOSCOPY (EGD) WITH PROPOFOL;  Surgeon: Lucilla Lame, MD;  Location: [facility];  Service: Gastroenterology;  Laterality: N/A;  Pre-diabetic  . excision of neck mass  09/1998  . GASTRIC BYPASS  03/12/2011  . LAPAROSCOPIC SUPRACERVICAL HYSTERECTOMY  2008   and unilateral salpingoophorectomy/ endometriosis-Dr Kincius  . TONSILLECTOMY      Family Psychiatric History:  She denied any family history of psychiatric illness   Family History:  Family History  Problem Relation Age of Onset  . Cancer Mother 52       colon  . Epilepsy Mother   . Glaucoma Father   . Breast cancer Maternal Grandmother 61  inflamitory breast ca    Social History:   Social History   Socioeconomic History  . Marital status: Married    Spouse name: allen  . Number of children: 2  . Years of education: Not on file  . Highest education level: Associate degree: occupational, Hotel manager, or vocational program  Occupational History  . Occupation: Therapist, art  Social Needs  . Financial resource strain: Not hard at all  . Food insecurity:    Worry: Never true    Inability: Never true  . Transportation needs:    Medical: No    Non-medical: No  Tobacco Use  . Smoking status: Current Every Day Smoker    Packs/day: 0.50    Years:  14.00    Pack years: 7.00    Types: Cigarettes    Start date: 06/11/2000  . Smokeless tobacco: Never Used  Substance and Sexual Activity  . Alcohol use: No    Alcohol/week: 0.0 oz    Comment: rarely  . Drug use: No  . Sexual activity: Yes    Partners: Male    Birth control/protection: Surgical  Lifestyle  . Physical activity:    Days per week: 5 days    Minutes per session: 60 min  . Stress: Rather much  Relationships  . Social connections:    Talks on phone: Three times a week    Gets together: Once a week    Attends religious service: More than 4 times per year    Active member of club or organization: No    Attends meetings of clubs or organizations: Never    Relationship status: Married  Other Topics Concern  . Not on file  Social History Narrative  . Not on file    Additional Social History:  Married x 17 years for first times.  Has children - 2 , 28 and 23  Now married x 9 years - Has good relationship with husband   Allergies:   Allergies  Allergen Reactions  . Vicodin [Hydrocodone-Acetaminophen] Other (See Comments)    Has really bad headaches/ migraine      Metabolic Disorder Labs: Lab Results  Component Value Date   HGBA1C 5.3 08/08/2017   MPG 105.41 08/08/2017   MPG 105 08/29/2016   No results found for: PROLACTIN Lab Results  Component Value Date   CHOL 108 08/08/2017   TRIG 82 08/08/2017   HDL 44 08/08/2017   CHOLHDL 2.5 08/08/2017   VLDL 16 08/08/2017   LDLCALC 48 08/08/2017   LDLCALC 104 (H) 08/29/2016     Current Medications: Current Outpatient Medications  Medication Sig Dispense Refill  . atorvastatin (LIPITOR) 40 MG tablet Take 1 tablet (40 mg total) by mouth daily. 90 tablet 3  . Cholecalciferol (VITAMIN D) 2000 units CAPS Take 1 capsule (2,000 Units total) by mouth daily. 30 capsule 0  . clonazePAM (KLONOPIN) 0.5 MG tablet Take 1 tablet (0.5 mg total) by mouth at bedtime. 30 tablet 2  . cyanocobalamin (,VITAMIN B-12,) 1000  MCG/ML injection   5  . Cyanocobalamin (VITAMIN B-12 IJ) Inject as directed every 30 (thirty) days.    . cyclobenzaprine (FLEXERIL) 5 MG tablet Take 1 tablet (5 mg total) by mouth at bedtime. for muscle spams 90 tablet 1  . DULoxetine (CYMBALTA) 30 MG capsule Take 1 capsule (30 mg total) by mouth daily. 30 capsule 2  . estradiol (ESTRACE VAGINAL) 0.1 MG/GM vaginal cream Insert 1 gm vaginally qhs x 2 weeks then decrease to 1GM 3 times/week 42.5  g 1  . levothyroxine (SYNTHROID, LEVOTHROID) 25 MCG tablet TAKE 1 TABLET BY MOUTH DAILY BEFORE BREAKFAST 90 tablet 0  . lithium carbonate (LITHOBID) 300 MG CR tablet Take 2 tablets (600 mg total) by mouth at bedtime. 60 tablet 3  . Multiple Vitamins-Minerals (MULTI-VITAMIN GUMMIES) CHEW Chew 2 capsules by mouth daily. 30 tablet 0  . omeprazole (PRILOSEC) 20 MG capsule Take 20 mg by mouth daily.    . QUEtiapine (SEROQUEL) 50 MG tablet TAKE 1 TABLET BY MOUTH AT BEDTIME 30 tablet 1  . SF 5000 PLUS 1.1 % CREA dental cream   3  . sucralfate (CARAFATE) 1 g tablet Take 1 tablet (1 g total) by mouth 4 (four) times daily. 120 tablet 1  . TRULICITY 1.5 KG/4.0NU SOPN Inject 1.5 mg into the skin once a week. 4 pen 2   No current facility-administered medications for this visit.     Neurologic: Headache: No Seizure: No Paresthesias:No  Musculoskeletal: Strength & Muscle Tone: within normal limits Gait & Station: normal Patient leans: N/A  Psychiatric Specialty Exam: Review of Systems  Musculoskeletal: Positive for joint pain.  Psychiatric/Behavioral: Positive for depression. The patient is nervous/anxious.     Blood pressure 128/81, pulse (!) 106, weight 130 lb 3.2 oz (59.1 kg).Body mass index is 21.67 kg/m.  General Appearance: Well Groomed  Eye Contact:  Fair  Speech:  Normal Rate  Volume:  Normal  Mood:  Anxious  Affect:  Depressed and Labile  Thought Process:  Coherent  Orientation:  Full (Time, Place, and Person)  Thought Content:  WDL   Suicidal Thoughts:  No  Homicidal Thoughts:  No  Memory:  Immediate;   Fair Recent;   Fair Remote;   Fair  Judgement:  Good  Insight:  Fair  Psychomotor Activity:  Increased  Concentration:  Concentration: Fair and Attention Span: Fair  Recall:  [employer] of Knowledge:Fair  Language: Fair  Akathisia:  No  Handed:  Right  AIMS (if indicated):    Assets:  Communication Skills Desire for Improvement Physical Health Social Support Transportation  ADL's:  Intact  Cognition: WNL  Sleep:      Treatment Plan Summary: Medication management   Discussed with patient at length about the medications treatment risks benefits and alternatives    I have refilled her medications as follows    Continue Cymbalta 30 mg daily.  Seroquel 50 mg by mouth daily at bedtime .  Klonopin 0.5 mg by mouth daily at bedtime  Continue lithium carbonate 600 mg at bedtime to help with the mood symptoms.  Follow-up in   6 months or earlier depending on her symptoms    More than 50% of the time spent in psychoeducation, counseling and coordination of care.    This note was generated in part or whole with voice recognition software. Voice regonition is usually quite accurate but there are transcription errors that can and very often do occur. I apologize for any typographical errors that were not detected and corrected.    Rainey Pines, MD 7/8/20193:15 PM

## 2017-12-30 ENCOUNTER — Encounter: Payer: Self-pay | Admitting: Oncology

## 2018-01-06 ENCOUNTER — Other Ambulatory Visit: Payer: Self-pay | Admitting: Family Medicine

## 2018-01-06 DIAGNOSIS — I700 Atherosclerosis of aorta: Secondary | ICD-10-CM

## 2018-01-09 ENCOUNTER — Inpatient Hospital Stay: Payer: 59 | Attending: Oncology | Admitting: Oncology

## 2018-01-09 ENCOUNTER — Encounter: Payer: Self-pay | Admitting: Oncology

## 2018-01-09 ENCOUNTER — Other Ambulatory Visit: Payer: Self-pay

## 2018-01-09 ENCOUNTER — Inpatient Hospital Stay: Payer: 59

## 2018-01-09 VITALS — BP 130/85 | HR 103 | Temp 98.8°F | Resp 18 | Ht 65.5 in | Wt 138.7 lb

## 2018-01-09 DIAGNOSIS — D509 Iron deficiency anemia, unspecified: Secondary | ICD-10-CM | POA: Diagnosis present

## 2018-01-09 DIAGNOSIS — R590 Localized enlarged lymph nodes: Secondary | ICD-10-CM | POA: Insufficient documentation

## 2018-01-09 DIAGNOSIS — R12 Heartburn: Secondary | ICD-10-CM | POA: Diagnosis not present

## 2018-01-09 DIAGNOSIS — F17210 Nicotine dependence, cigarettes, uncomplicated: Secondary | ICD-10-CM | POA: Diagnosis not present

## 2018-01-09 DIAGNOSIS — Z8571 Personal history of Hodgkin lymphoma: Secondary | ICD-10-CM

## 2018-01-09 DIAGNOSIS — R1013 Epigastric pain: Secondary | ICD-10-CM | POA: Insufficient documentation

## 2018-01-09 DIAGNOSIS — Z9884 Bariatric surgery status: Secondary | ICD-10-CM

## 2018-01-09 DIAGNOSIS — Z9221 Personal history of antineoplastic chemotherapy: Secondary | ICD-10-CM | POA: Diagnosis not present

## 2018-01-09 DIAGNOSIS — Z923 Personal history of irradiation: Secondary | ICD-10-CM | POA: Diagnosis not present

## 2018-01-09 DIAGNOSIS — C8190 Hodgkin lymphoma, unspecified, unspecified site: Secondary | ICD-10-CM | POA: Diagnosis present

## 2018-01-09 LAB — COMPREHENSIVE METABOLIC PANEL
ALT: 13 U/L (ref 0–44)
ANION GAP: 4 — AB (ref 5–15)
AST: 18 U/L (ref 15–41)
Albumin: 3.5 g/dL (ref 3.5–5.0)
Alkaline Phosphatase: 206 U/L — ABNORMAL HIGH (ref 38–126)
BILIRUBIN TOTAL: 0.2 mg/dL — AB (ref 0.3–1.2)
BUN: 8 mg/dL (ref 6–20)
CO2: 29 mmol/L (ref 22–32)
Calcium: 8.7 mg/dL — ABNORMAL LOW (ref 8.9–10.3)
Chloride: 107 mmol/L (ref 98–111)
Creatinine, Ser: 0.63 mg/dL (ref 0.44–1.00)
GLUCOSE: 82 mg/dL (ref 70–99)
POTASSIUM: 4 mmol/L (ref 3.5–5.1)
Sodium: 140 mmol/L (ref 135–145)
TOTAL PROTEIN: 7 g/dL (ref 6.5–8.1)

## 2018-01-09 LAB — CBC WITH DIFFERENTIAL/PLATELET
BASOS ABS: 0 10*3/uL (ref 0–0.1)
BASOS PCT: 1 %
EOS ABS: 0.3 10*3/uL (ref 0–0.7)
Eosinophils Relative: 4 %
HCT: 32.3 % — ABNORMAL LOW (ref 35.0–47.0)
HEMOGLOBIN: 10.7 g/dL — AB (ref 12.0–16.0)
Lymphocytes Relative: 52 %
Lymphs Abs: 3.8 10*3/uL — ABNORMAL HIGH (ref 1.0–3.6)
MCH: 27.6 pg (ref 26.0–34.0)
MCHC: 33.1 g/dL (ref 32.0–36.0)
MCV: 83.5 fL (ref 80.0–100.0)
MONOS PCT: 5 %
Monocytes Absolute: 0.4 10*3/uL (ref 0.2–0.9)
NEUTROS PCT: 38 %
Neutro Abs: 2.8 10*3/uL (ref 1.4–6.5)
Platelets: 356 10*3/uL (ref 150–440)
RBC: 3.87 MIL/uL (ref 3.80–5.20)
RDW: 16.2 % — ABNORMAL HIGH (ref 11.5–14.5)
WBC: 7.4 10*3/uL (ref 3.6–11.0)

## 2018-01-09 LAB — TSH: TSH: 1.461 u[IU]/mL (ref 0.350–4.500)

## 2018-01-09 LAB — IRON AND TIBC
IRON: 26 ug/dL — AB (ref 28–170)
SATURATION RATIOS: 7 % — AB (ref 10.4–31.8)
TIBC: 371 ug/dL (ref 250–450)
UIBC: 345 ug/dL

## 2018-01-09 LAB — LACTATE DEHYDROGENASE: LDH: 123 U/L (ref 98–192)

## 2018-01-09 LAB — FERRITIN: Ferritin: 4 ng/mL — ABNORMAL LOW (ref 11–307)

## 2018-01-09 LAB — SEDIMENTATION RATE: Sed Rate: 56 mm/hr — ABNORMAL HIGH (ref 0–30)

## 2018-01-09 MED ORDER — SUCRALFATE 1 G PO TABS: 1.0000 g | ORAL_TABLET | Freq: Four times a day (QID) | ORAL | 2 refills | Status: DC

## 2018-01-09 MED ORDER — OMEPRAZOLE 40 MG PO CPDR: 40.0000 mg | DELAYED_RELEASE_CAPSULE | Freq: Every day | ORAL | 3 refills | Status: DC

## 2018-01-09 NOTE — Progress Notes (Signed)
Patient here for initial visit. °

## 2018-01-09 NOTE — Progress Notes (Signed)
Hematology/Oncology Consult note [facility] [phone] [phone]   Patient Care Team: Steele Sizer, MD as PCP - General (Family Medicine) Dalia Heading, CNM as Midwife (Certified Nurse Midwife) Lucilla Lame, MD as Consulting Physician (Gastroenterology)  REFERRING PROVIDER: Steele Sizer, MD CHIEF COMPLAINTS/REASON FOR VISIT:  Evaluation of history of Hodgkin lymphoma   HISTORY OF PRESENTING ILLNESS:  Jessica [name] is a  52 y.o.  female with PMH listed below who was referred to me for evaluation of Hodgkin lymphoma   History of Hodgkin lymphoma, diagnosed in 2000, treated with ABVD x 4 cycles and mantle radiation. She moved to [facility] and has followed up with Dr.Yebane for a period of time.   History of gastric bypass in 2013, reports feeling chronic intermittent epigastric pain for the past 12 months.  In September 2019 she went to ER for epigastric pain evaluation. CT abdomen Pelvis showed essentric wall thickening at the gastrojejunal anastomosis. Non specific new mild gastrohepatic ligament lymphadenopathy.  Stable left adrenal adenoma.   Patient reports not having good appetite due to epigastric pain.  Most recent upper endoscopy by Dr.Wohl on 02/21/2017, showed gastric bypass with normal sized pouch and intact staple line.  One non bleeding jejunal ulcer with no stigmata of bleeding. NO specimen collected. She takes Carafate with minimal symptom relief.  Active daily smoker.  Weight is stable for the past 6 months, around 135-138 pounds. Denies night sweat, fever or chills. Denies fatigue.     Review of Systems  Constitutional: Negative for chills, fever, malaise/fatigue and weight loss.  HENT: Negative for nosebleeds and sore throat.   Eyes: Negative for double vision, photophobia and redness.  Respiratory: Negative for cough, shortness of breath and wheezing.   Cardiovascular: Negative for chest pain, palpitations and  orthopnea.  Gastrointestinal: Positive for heartburn. Negative for abdominal pain, blood in stool, nausea and vomiting.       Epigastric pain  Genitourinary: Negative for dysuria.  Musculoskeletal: Negative for back pain, myalgias and neck pain.  Skin: Negative for itching and rash.  Neurological: Negative for dizziness, tingling and tremors.  Endo/Heme/Allergies: Negative for environmental allergies. Does not bruise/bleed easily.  Psychiatric/Behavioral: Negative for depression.    MEDICAL HISTORY:  Past Medical History:  Diagnosis Date  . ADHD (attention deficit hyperactivity disorder)   . Anxiety   . Bipolar 1 disorder ([place])   . Cancer ([place])    Hodgkin's Lymphoma 09/1998  . Depression   . Endometriosis   . GERD (gastroesophageal reflux disease)   . History of gastric bypass 2012  . Hodgkin's lymphoma ([place]) 2000  . Hypothyroidism   . Pre-diabetes   . Tobacco use disorder   . Ulcer jejunum     SURGICAL HISTORY: Past Surgical History:  Procedure Laterality Date  . BUNIONECTOMY Right   . CARPAL TUNNEL RELEASE    . COLONOSCOPY     multiple  . ESOPHAGOGASTRODUODENOSCOPY (EGD) WITH PROPOFOL N/A 02/21/2017   Procedure: ESOPHAGOGASTRODUODENOSCOPY (EGD) WITH PROPOFOL;  Surgeon: Lucilla Lame, MD;  Location: [facility];  Service: Gastroenterology;  Laterality: N/A;  Pre-diabetic  . excision of neck mass  09/1998  . GASTRIC BYPASS  03/12/2011  . LAPAROSCOPIC SUPRACERVICAL HYSTERECTOMY  2008   and unilateral salpingoophorectomy/ endometriosis-Dr Kincius  . TONSILLECTOMY      SOCIAL HISTORY: Social History   Socioeconomic History  . Marital status: Married    Spouse name: allen  . Number of children: 2  . Years of education: Not on file  .  Highest education level: Associate degree: occupational, Hotel manager, or vocational program  Occupational History  . Occupation: Administrator: [employer]  Social Needs  . Financial resource  strain: Not hard at all  . Food insecurity:    Worry: Never true    Inability: Never true  . Transportation needs:    Medical: No    Non-medical: No  Tobacco Use  . Smoking status: Current Every Day Smoker    Packs/day: 0.25    Years: 15.00    Pack years: 3.75    Types: Cigarettes    Start date: 06/11/2000  . Smokeless tobacco: Never Used  Substance and Sexual Activity  . Alcohol use: No    Alcohol/week: 0.0 oz    Comment: rarely  . Drug use: No  . Sexual activity: Yes    Partners: Male    Birth control/protection: Surgical  Lifestyle  . Physical activity:    Days per week: 5 days    Minutes per session: 60 min  . Stress: Rather much  Relationships  . Social connections:    Talks on phone: Three times a week    Gets together: Once a week    Attends religious service: More than 4 times per year    Active member of club or organization: No    Attends meetings of clubs or organizations: Never    Relationship status: Married  . Intimate partner violence:    Fear of current or ex partner: No    Emotionally abused: No    Physically abused: No    Forced sexual activity: No  Other Topics Concern  . Not on file  Social History Narrative  . Not on file    FAMILY HISTORY: Family History  Problem Relation Age of Onset  . Cancer Mother 30       colon  . Epilepsy Mother   . Glaucoma Father   . Breast cancer Maternal Grandmother 83       inflamitory breast ca    ALLERGIES:  is allergic to vicodin [hydrocodone-acetaminophen].  MEDICATIONS:  Current Outpatient Medications  Medication Sig Dispense Refill  . atorvastatin (LIPITOR) 40 MG tablet TAKE 1 TABLET BY MOUTH DAILY 90 tablet 1  . Cholecalciferol (VITAMIN D) 2000 units CAPS Take 1 capsule (2,000 Units total) by mouth daily. 30 capsule 0  . clonazePAM (KLONOPIN) 0.5 MG tablet Take 1 tablet (0.5 mg total) by mouth at bedtime. 30 tablet 5  . cyanocobalamin (,VITAMIN B-12,) 1000 MCG/ML injection   5  . cyclobenzaprine  (FLEXERIL) 5 MG tablet Take 1 tablet (5 mg total) by mouth at bedtime. for muscle spams 90 tablet 1  . DULoxetine (CYMBALTA) 30 MG capsule Take 1 capsule (30 mg total) by mouth daily. 90 capsule 2  . estradiol (ESTRACE VAGINAL) 0.1 MG/GM vaginal cream Insert 1 gm vaginally qhs x 2 weeks then decrease to 1GM 3 times/week 42.5 g 1  . levothyroxine (SYNTHROID, LEVOTHROID) 25 MCG tablet TAKE 1 TABLET BY MOUTH DAILY BEFORE BREAKFAST 90 tablet 0  . Multiple Vitamins-Minerals (MULTI-VITAMIN GUMMIES) CHEW Chew 2 capsules by mouth daily. 30 tablet 0  . QUEtiapine (SEROQUEL) 50 MG tablet Take 1 tablet (50 mg total) by mouth at bedtime. 90 tablet 1  . SF 5000 PLUS 1.1 % CREA dental cream   3  . sucralfate (CARAFATE) 1 g tablet Take 1 tablet (1 g total) by mouth 4 (four) times daily. 120 tablet 2  . TRULICITY 1.5 XL/2.4MW SOPN Inject  1.5 mg into the skin once a week. 4 pen 5  . lithium carbonate (LITHOBID) 300 MG CR tablet Take 2 tablets (600 mg total) by mouth at bedtime. (Patient not taking: Reported on 01/09/2018) 180 tablet 3  . omeprazole (PRILOSEC) 40 MG capsule Take 1 capsule (40 mg total) by mouth daily. 30 capsule 3   No current facility-administered medications for this visit.      PHYSICAL EXAMINATION: ECOG PERFORMANCE STATUS: 1 - Symptomatic but completely ambulatory Vitals:   01/09/18 1513  BP: 130/85  Pulse: (!) 103  Resp: 18  Temp: 98.8 F (37.1 C)   Filed Weights   01/09/18 1513  Weight: 138 lb 11.2 oz (62.9 kg)    Physical Exam  Constitutional: She is oriented to person, place, and time. No distress.  HENT:  Head: Normocephalic and atraumatic.  Right Ear: External ear normal.  Left Ear: External ear normal.  Mouth/Throat: Oropharynx is clear and moist.  Eyes: Pupils are equal, round, and reactive to light. Conjunctivae and EOM are normal. No scleral icterus.  Neck: Normal range of motion. Neck supple.  Cardiovascular: Normal rate, regular rhythm and normal heart sounds.    Pulmonary/Chest: Effort normal. No respiratory distress. She has no wheezes.  Abdominal: Soft. Bowel sounds are normal. She exhibits no distension and no mass. There is no tenderness.  Musculoskeletal: Normal range of motion. She exhibits no edema or deformity.  Neurological: She is alert and oriented to person, place, and time. No cranial nerve deficit. Coordination normal.  Skin: Skin is warm and dry. No rash noted. No erythema.  Psychiatric: She has a normal mood and affect. Her behavior is normal. Thought content normal.     LABORATORY DATA:  I have reviewed the data as listed Lab Results  Component Value Date   WBC 7.4 01/09/2018   HGB 10.7 (L) 01/09/2018   HCT 32.3 (L) 01/09/2018   MCV 83.5 01/09/2018   PLT 356 01/09/2018   Recent Labs    02/18/17 0949 01/09/18 1605  NA 137 140  K 4.2 4.0  CL 102 107  CO2 28 29  GLUCOSE 128* 82  BUN 17 8  CREATININE 0.61 0.63  CALCIUM 9.1 8.7*  GFRNONAA >60 >60  GFRAA >60 >60  PROT 7.1 7.0  ALBUMIN 3.5 3.5  AST 22 18  ALT 16 13  ALKPHOS 157* 206*  BILITOT 0.3 0.2*   Iron/TIBC/Ferritin/ %Sat    Component Value Date/Time   IRON 26 (L) 01/09/2018 1605   IRON 74 12/03/2012 0725   TIBC 371 01/09/2018 1605   TIBC 260 12/03/2012 0725   FERRITIN 4 (L) 01/09/2018 1605   FERRITIN 41 07/08/2013 1445   IRONPCTSAT 7 (L) 01/09/2018 1605   IRONPCTSAT 28 12/03/2012 0725    RADIOGRAPHIC STUDIES: I have personally reviewed the radiological images as listed and agreed with the findings in the report. 02/18/2018  IMPRESSION: 1. Eccentric wall thickening at the posterior gastrojejunal anastomosis with surrounding fat stranding. Findings are nonspecific, most suggestive of gastrojejunal anastomotic ulcer disease, with neoplasm not excluded. Upper endoscopic or surgical correlation should be considered. No free air. No abscess. 2. Nonspecific new mild gastrohepatic ligament lymphadenopathy. 3. No evidence of bowel obstruction. 4. Stable  large left adrenal adenoma.   ASSESSMENT & PLAN:  1. Lymphadenopathy, abdominal   2. Hodgkin's disease in remission ([place])   3. History of gastric bypass   4. Epigastric pain   5. Iron deficiency anemia, unspecified iron deficiency anemia type   CT image  was independently reviewed and discussed with patient.  Discussed with patient that Hodgkin's lymphoma usually recurrs with the first 3-5 years. Late recurrence is rare.  Check cbc, cmp, LDH, ESR, flowcytometry.  Given her history of mantle radiation, check TSH.  Epigastric pain is most likely due to chronic inflammation/gastritis/GERD  Start trial of Omeprazole 65m daily, continue sucralfate.  Continue follow up with PCP for monthly parental Vitamin B12 injections. Check iron TIBC ferritin  Gastrohepatic ligament lymphadenopathy, will check CT chest abdomen pelvis with IV contrast for follow up  Obtain past medical records for Hodgkin's lymphoma treatment history.   Iron deficiency anemia: labs reviewed, patient has iron deficiency anemia.  Will contact patient for IV iron infusion.   Orders Placed This Encounter  Procedures  . CT Chest W Contrast    Standing Status:   Future    Standing Expiration Date:   01/09/2019    Order Specific Question:   ** REASON FOR EXAM (FREE TEXT)    Answer:   remote histroy of Hodgkin's lymphoma, abdomen lymphadenopathy    Order Specific Question:   If indicated for the ordered procedure, I authorize the administration of contrast media per Radiology protocol    Answer:   Yes    Order Specific Question:   Is patient pregnant?    Answer:   No    Order Specific Question:   Preferred imaging location?    Answer:   [facility]    Order Specific Question:   Radiology Contrast Protocol - do NOT remove file path    Answer:   \\[email or web address]  . CT Abdomen Pelvis W Contrast    Standing Status:   Future    Standing Expiration Date:   01/09/2019    Order Specific  Question:   ** REASON FOR EXAM (FREE TEXT)    Answer:   remote histroy of hodgkin's lymphoma    Order Specific Question:   If indicated for the ordered procedure, I authorize the administration of contrast media per Radiology protocol    Answer:   Yes    Order Specific Question:   Is patient pregnant?    Answer:   No    Order Specific Question:   Preferred imaging location?    Answer:   [facility]    Order Specific Question:   Is Oral Contrast requested for this exam?    Answer:   No oral contrast    Order Specific Question:   Reason for No Oral Contrast    Answer:   Not tolerating liquids    Order Specific Question:   Radiology Contrast Protocol - do NOT remove file path    Answer:   \\[email or web address]  . CBC with Differential/Platelet    Standing Status:   Future    Number of Occurrences:   1    Standing Expiration Date:   01/10/2019  . Comprehensive metabolic panel    Standing Status:   Future    Number of Occurrences:   1    Standing Expiration Date:   01/10/2019  . Lactate dehydrogenase    Standing Status:   Future    Number of Occurrences:   1    Standing Expiration Date:   01/10/2019  . Flow cytometry panel-leukemia/lymphoma work-up    Standing Status:   Future    Number of Occurrences:   1    Standing Expiration Date:   01/10/2019  . Sedimentation rate    Standing Status:   Future    Number  of Occurrences:   1    Standing Expiration Date:   01/10/2019  . Iron and TIBC    Standing Status:   Future    Number of Occurrences:   1    Standing Expiration Date:   01/10/2019  . Ferritin    Standing Status:   Future    Number of Occurrences:   1    Standing Expiration Date:   01/10/2019  . TSH    Standing Status:   Future    Number of Occurrences:   1    Standing Expiration Date:   01/10/2019    All questions were answered. The patient knows to call the clinic with any problems questions or concerns.  Return of visit: 4 weeks.  Thank you for this kind  referral and the opportunity to participate in the care of this patient. A copy of today's note is routed to referring provider  Total face to face encounter time for this patient visit was 60  min. >50% of the time was  spent in counseling and coordination of care.    Earlie Server, MD, PhD Hematology Oncology [facility] Pager- [phone] 01/09/2018

## 2018-01-11 ENCOUNTER — Emergency Department
Admission: EM | Admit: 2018-01-11 | Discharge: 2018-01-11 | Disposition: A | Discharge status: Home / Self Care | Payer: 59 | Attending: Emergency Medicine | Admitting: Emergency Medicine

## 2018-01-11 ENCOUNTER — Encounter: Payer: Self-pay | Admitting: Emergency Medicine

## 2018-01-11 ENCOUNTER — Other Ambulatory Visit: Payer: Self-pay

## 2018-01-11 ENCOUNTER — Emergency Department: Payer: 59

## 2018-01-11 DIAGNOSIS — Y9301 Activity, walking, marching and hiking: Secondary | ICD-10-CM | POA: Diagnosis not present

## 2018-01-11 DIAGNOSIS — W22042A Striking against wall of swimming pool causing other injury, initial encounter: Secondary | ICD-10-CM | POA: Diagnosis not present

## 2018-01-11 DIAGNOSIS — Z8571 Personal history of Hodgkin lymphoma: Secondary | ICD-10-CM | POA: Insufficient documentation

## 2018-01-11 DIAGNOSIS — Z79899 Other long term (current) drug therapy: Secondary | ICD-10-CM | POA: Diagnosis not present

## 2018-01-11 DIAGNOSIS — F17210 Nicotine dependence, cigarettes, uncomplicated: Secondary | ICD-10-CM | POA: Insufficient documentation

## 2018-01-11 DIAGNOSIS — S20212A Contusion of left front wall of thorax, initial encounter: Secondary | ICD-10-CM | POA: Diagnosis not present

## 2018-01-11 DIAGNOSIS — I10 Essential (primary) hypertension: Secondary | ICD-10-CM | POA: Diagnosis not present

## 2018-01-11 DIAGNOSIS — E039 Hypothyroidism, unspecified: Secondary | ICD-10-CM | POA: Diagnosis not present

## 2018-01-11 DIAGNOSIS — Y999 Unspecified external cause status: Secondary | ICD-10-CM | POA: Diagnosis not present

## 2018-01-11 DIAGNOSIS — S299XXA Unspecified injury of thorax, initial encounter: Secondary | ICD-10-CM | POA: Diagnosis present

## 2018-01-11 DIAGNOSIS — Y929 Unspecified place or not applicable: Secondary | ICD-10-CM | POA: Diagnosis not present

## 2018-01-11 MED ORDER — OXYCODONE-ACETAMINOPHEN 7.5-325 MG PO TABS: 1.0000 | ORAL_TABLET | Freq: Four times a day (QID) | ORAL | 0 refills | Status: DC | PRN

## 2018-01-11 NOTE — ED Triage Notes (Signed)
Tripped and fell 2 days ago, hit L chest on deck, pain since. Mechanical fall.

## 2018-01-11 NOTE — ED Provider Notes (Signed)
[facility] Emergency Department Provider Note   ____________________________________________   First MD Initiated Contact with Patient 01/11/18 0720     (approximate)  I have reviewed the triage vital signs and the nursing notes.   HISTORY  Chief Complaint Rib Injury    HPI Jessica [name] is a 52 y.o. female patient complaining of left lateral chest wall pain secondary to fall 2 days ago.  Patient that she tripped and fell and hit the edge of a pool deck.  Patient rates the pain as 8/10.  Patient described the pain is "sharp".  Patient states pain increases with deep inspirations or palpation.  No relief over-the-counter anti-inflammatory medications.   Past Medical History:  Diagnosis Date  . ADHD (attention deficit hyperactivity disorder)   . Anxiety   . Bipolar 1 disorder ([place])   . Cancer ([place])    Hodgkin's Lymphoma 09/1998  . Depression   . Endometriosis   . GERD (gastroesophageal reflux disease)   . History of gastric bypass 2012  . Hodgkin's lymphoma ([place]) 2000  . Hypothyroidism   . Pre-diabetes   . Tobacco use disorder   . Ulcer jejunum     Patient Active Problem List   Diagnosis Date Noted  . Iron deficiency anemia 01/09/2018  . Cervical high risk HPV (human papillomavirus) test positive 04/08/2017  . Abdominal pain, epigastric   . Bariatric surgery status   . Ulcer jejunum   . Adenoma of left adrenal gland 02/18/2017  . Abnormal TSH 08/30/2016  . Bipolar disorder ([place]) 12/02/2015  . HAV (hallux abducto valgus) 11/22/2015  . Bunion of great toe 11/08/2015  . Attention-deficit hyperactivity disorder, other type 10/28/2015  . Sciatica 04/22/2015  . B12 deficiency 12/01/2014  . History of bariatric surgery 12/01/2014  . Anxiety, generalized 12/01/2014  . Gastro-esophageal reflux disease without esophagitis 12/01/2014  . H/O elevated lipids 12/01/2014  . H/O: HTN (hypertension) 12/01/2014  . H/O: obesity 12/01/2014  .  Hodgkin's disease in remission ([place]) 12/01/2014  . Current tobacco use 12/01/2014  . Insomnia 03/24/2008  . Abnormal serum level of alkaline phosphatase 11/08/2006  . Chronic recurrent major depressive disorder ([place]) 11/08/2006    Past Surgical History:  Procedure Laterality Date  . ABDOMINAL HYSTERECTOMY    . BUNIONECTOMY Right   . CARPAL TUNNEL RELEASE    . COLONOSCOPY     multiple  . ESOPHAGOGASTRODUODENOSCOPY (EGD) WITH PROPOFOL N/A 02/21/2017   Procedure: ESOPHAGOGASTRODUODENOSCOPY (EGD) WITH PROPOFOL;  Surgeon: Lucilla Lame, MD;  Location: [facility];  Service: Gastroenterology;  Laterality: N/A;  Pre-diabetic  . excision of neck mass  09/1998  . GASTRIC BYPASS  03/12/2011  . LAPAROSCOPIC SUPRACERVICAL HYSTERECTOMY  2008   and unilateral salpingoophorectomy/ endometriosis-Dr Kincius  . TONSILLECTOMY      Prior to Admission medications   Medication Sig Start Date End Date Taking? Authorizing Provider  atorvastatin (LIPITOR) 40 MG tablet TAKE 1 TABLET BY MOUTH DAILY 01/06/18   Steele Sizer, MD  Cholecalciferol (VITAMIN D) 2000 units CAPS Take 1 capsule (2,000 Units total) by mouth daily. 01/22/17   Steele Sizer, MD  clonazePAM (KLONOPIN) 0.5 MG tablet Take 1 tablet (0.5 mg total) by mouth at bedtime. 12/16/17   Rainey Pines, MD  cyanocobalamin (,VITAMIN B-12,) 1000 MCG/ML injection  03/25/17   [provider]  cyclobenzaprine (FLEXERIL) 5 MG tablet Take 1 tablet (5 mg total) by mouth at bedtime. for muscle spams 07/25/17   Steele Sizer, MD  DULoxetine (CYMBALTA) 30 MG capsule Take  1 capsule (30 mg total) by mouth daily. 12/16/17   Rainey Pines, MD  estradiol (ESTRACE VAGINAL) 0.1 MG/GM vaginal cream Insert 1 gm vaginally qhs x 2 weeks then decrease to 1GM 3 times/week 04/08/17   Dalia Heading, CNM  levothyroxine (SYNTHROID, LEVOTHROID) 25 MCG tablet TAKE 1 TABLET BY MOUTH DAILY BEFORE BREAKFAST 11/08/17   Steele Sizer, MD  lithium carbonate (LITHOBID)  300 MG CR tablet Take 2 tablets (600 mg total) by mouth at bedtime. Patient not taking: Reported on 01/09/2018 12/16/17   Rainey Pines, MD  Multiple Vitamins-Minerals (MULTI-VITAMIN GUMMIES) CHEW Chew 2 capsules by mouth daily. 01/22/17   Steele Sizer, MD  omeprazole (PRILOSEC) 40 MG capsule Take 1 capsule (40 mg total) by mouth daily. 01/09/18   Earlie Server, MD  oxyCODONE-acetaminophen (PERCOCET) 7.5-325 MG tablet Take 1 tablet by mouth every 6 (six) hours as needed. 01/11/18   Sable Feil, PA-C  QUEtiapine (SEROQUEL) 50 MG tablet Take 1 tablet (50 mg total) by mouth at bedtime. 12/16/17   Rainey Pines, MD  SF 5000 PLUS 1.1 % CREA dental cream  03/05/17   [provider]  sucralfate (CARAFATE) 1 g tablet Take 1 tablet (1 g total) by mouth 4 (four) times daily. 01/09/18   Earlie Server, MD  TRULICITY 1.5 HW/2.9HB SOPN Inject 1.5 mg into the skin once a week. 12/16/17   Steele Sizer, MD    Allergies Vicodin [hydrocodone-acetaminophen]  Family History  Problem Relation Age of Onset  . Cancer Mother 77       colon  . Epilepsy Mother   . Glaucoma Father   . Breast cancer Maternal Grandmother 83       inflamitory breast ca    Social History Social History   Tobacco Use  . Smoking status: Current Every Day Smoker    Packs/day: 0.50    Years: 15.00    Pack years: 7.50    Types: Cigarettes    Start date: 06/11/2000  . Smokeless tobacco: Never Used  Substance Use Topics  . Alcohol use: No    Alcohol/week: 0.0 oz    Comment: rarely  . Drug use: No    Review of Systems Constitutional: No fever/chills Eyes: No visual changes. ENT: No sore throat. Cardiovascular: Denies chest pain. Respiratory: Denies shortness of breath. Gastrointestinal: No abdominal pain.  No nausea, no vomiting.  No diarrhea.  No constipation. Genitourinary: Negative for dysuria. Musculoskeletal: Negative for back pain. Skin: Negative for rash. Neurological: Negative for headaches, focal weakness or  numbness. Psychiatric:ADHD, anxiety, bipolar, and depression. Endocrine:Hypothyroidism and prediabetes. Allergic/Immunilogical: Vicodin ____________________________________________   PHYSICAL EXAM:  VITAL SIGNS: ED Triage Vitals  Enc Vitals Group     BP 01/11/18 0715 (!) 144/86     Pulse Rate 01/11/18 0715 (!) 102     Resp 01/11/18 0715 18     Temp 01/11/18 0715 98.7 F (37.1 C)     Temp Source 01/11/18 0715 Oral     SpO2 01/11/18 0715 100 %     Weight 01/11/18 0716 138 lb (62.6 kg)     Height 01/11/18 0716 5\' 5"  (1.651 m)     Head Circumference --      Peak Flow --      Pain Score 01/11/18 0716 8     Pain Loc --      Pain Edu? --      Excl. in [place]? --     Constitutional: Alert and oriented.  Moderate distress.   Neck: No stridor.  No cervical spine tenderness to palpation. Hematological/Lymphatic/Immunilogical: No cervical lymphadenopathy. Cardiovascular: Normal rate, regular rhythm. Grossly normal heart sounds.  Good peripheral circulation. Respiratory: Normal respiratory effort.  No retractions. Lungs CTAB. Musculoskeletal: No obvious chest wall deformity.  Moderate guarding palpation of left lateral ribs 6 through 8. Neurologic:  Normal speech and language. No gross focal neurologic deficits are appreciated. No gait instability. Skin:  Skin is warm, dry and intact. No rash noted. Psychiatric: Mood and affect are normal. Speech and behavior are normal.  ____________________________________________   LABS (all labs ordered are listed, but only abnormal results are displayed)  Labs Reviewed - No data to display ____________________________________________  EKG   ____________________________________________  RADIOLOGY  ED MD interpretation:    Official radiology report(s): Dg Ribs Unilateral W/chest Left  Result Date: 01/11/2018 CLINICAL DATA:  Tripped and fell 2 days ago, hit L chest on deck rail, pain since. Mechanical fall. Hx - hodgkin's lymphoma,  pre-diabetic, current smoker 0.5 ppd. EXAM: LEFT RIBS AND CHEST - 3+ VIEW COMPARISON:  Chest x-ray dated 06/06/2012. FINDINGS: Single-view of the chest and two views of the LEFT ribs are provided. Heart size and mediastinal contours are within normal limits. Lungs are clear. No pleural effusion or pneumothorax seen. Osseous structures about the chest are unremarkable. No LEFT-sided rib fracture or displacement seen. IMPRESSION: Negative. Electronically Signed   By: Franki Cabot M.D.   On: 01/11/2018 08:00    ____________________________________________   PROCEDURES  Procedure(s) performed: None  Procedures  Critical Care performed: No  ____________________________________________   INITIAL IMPRESSION / ASSESSMENT AND PLAN / ED COURSE  As part of my medical decision making, I reviewed the following data within the electronic MEDICAL RECORD NUMBER  Left chest wall pain secondary to rib contusion.  Discussed x-ray findings with patient.  Patient given discharge care instruction advised take medication as directed.  Patient advised follow-up PCP as needed.        ____________________________________________   FINAL CLINICAL IMPRESSION(S) / ED DIAGNOSES  Final diagnoses:  Rib contusion, left, initial encounter     ED Discharge Orders        Ordered    oxyCODONE-acetaminophen (PERCOCET) 7.5-325 MG tablet  Every 6 hours PRN     01/11/18 0811       Note:  This document was prepared using Dragon voice recognition software and may include unintentional dictation errors.    Sable Feil, PA-C 01/11/18 Harrel Lemon    Delman Kitten, MD 01/11/18 1515

## 2018-01-14 LAB — COMP PANEL: LEUKEMIA/LYMPHOMA

## 2018-01-15 ENCOUNTER — Other Ambulatory Visit: Payer: Self-pay

## 2018-01-15 ENCOUNTER — Encounter: Payer: Self-pay | Admitting: Oncology

## 2018-01-15 ENCOUNTER — Inpatient Hospital Stay (HOSPITAL_BASED_OUTPATIENT_CLINIC_OR_DEPARTMENT_OTHER): Payer: 59 | Admitting: Oncology

## 2018-01-15 ENCOUNTER — Inpatient Hospital Stay: Payer: 59

## 2018-01-15 VITALS — BP 154/93 | HR 83 | Temp 97.4°F | Resp 18 | Wt 134.2 lb

## 2018-01-15 VITALS — BP 154/84 | HR 76 | Resp 18

## 2018-01-15 DIAGNOSIS — C8190 Hodgkin lymphoma, unspecified, unspecified site: Secondary | ICD-10-CM | POA: Diagnosis not present

## 2018-01-15 DIAGNOSIS — Z8571 Personal history of Hodgkin lymphoma: Secondary | ICD-10-CM

## 2018-01-15 DIAGNOSIS — D509 Iron deficiency anemia, unspecified: Secondary | ICD-10-CM

## 2018-01-15 DIAGNOSIS — R1013 Epigastric pain: Secondary | ICD-10-CM | POA: Diagnosis not present

## 2018-01-15 DIAGNOSIS — Z9884 Bariatric surgery status: Secondary | ICD-10-CM

## 2018-01-15 DIAGNOSIS — F17210 Nicotine dependence, cigarettes, uncomplicated: Secondary | ICD-10-CM

## 2018-01-15 MED ORDER — IRON SUCROSE 20 MG/ML IV SOLN
200.0000 mg | Freq: Once | INTRAVENOUS | Status: AC
  Administered 2018-01-15: 200 mg via INTRAVENOUS
  Filled 2018-01-15: qty 10

## 2018-01-15 MED ORDER — SODIUM CHLORIDE 0.9 % IV SOLN
Freq: Once | INTRAVENOUS | Status: AC
  Administered 2018-01-15: 14:00:00 via INTRAVENOUS
  Filled 2018-01-15: qty 1000

## 2018-01-15 NOTE — Progress Notes (Signed)
Patient here for follow up. No complaints voiced.

## 2018-01-15 NOTE — Progress Notes (Signed)
Hematology/Oncology follow up note [facility] [phone] [phone]   Patient Care Team: Sowles, Krichna, MD as PCP - General (Family Medicine) Gutierrez, Colleen, CNM as Midwife (Certified Nurse Midwife) Wohl, Darren, MD as Consulting Physician (Gastroenterology)  REFERRING PROVIDER: Sowles, Krichna, MD CHIEF COMPLAINTS/REASON FOR VISIT:  Evaluation of history of Hodgkin lymphoma   HISTORY OF PRESENTING ILLNESS:  Jessica [name] is a  51 y.o.  female with PMH listed below who was referred to me for evaluation of Hodgkin lymphoma   History of Hodgkin lymphoma, diagnosed in 2000, treated with ABVD x 4 cycles and mantle radiation. She moved to [place] and has followed up with Dr.Yebane for a period of time.   History of gastric bypass in 2013, reports feeling chronic intermittent epigastric pain for the past 12 months.  In September 2019 she went to ER for epigastric pain evaluation. CT abdomen Pelvis showed essentric wall thickening at the gastrojejunal anastomosis. Non specific new mild gastrohepatic ligament lymphadenopathy.  Stable left adrenal adenoma.   Patient reports not having good appetite due to epigastric pain.  Most recent upper endoscopy by Dr.Wohl on 02/21/2017, showed gastric bypass with normal sized pouch and intact staple line.  One non bleeding jejunal ulcer with no stigmata of bleeding. NO specimen collected. She takes Carafate with minimal symptom relief.  Active daily smoker.  Weight is stable for the past 6 months, around 135-138 pounds. Denies night sweat, fever or chills.    INTERVAL HISTORY Jessica [name] is a 51 y.o. female who has above history reviewed by me today presents for follow up visit for discussion of lab results and management of iron deficiency anemia.   #Fatigue:  Chronic onset, perisistent, no aggravating or improving factors, no associated symptoms.  #Epigastric pain, chronic, started on omeprazole  since last visit.  Continued on Carafate.  She feels heartburn is better however epigastric pain is not much relieved.    Review of Systems  Constitutional: Negative for chills, fever, malaise/fatigue and weight loss.  HENT: Negative for nosebleeds and sore throat.   Eyes: Negative for double vision, photophobia and redness.  Respiratory: Negative for cough, shortness of breath and wheezing.   Cardiovascular: Negative for chest pain, palpitations and orthopnea.  Gastrointestinal: Positive for heartburn. Negative for abdominal pain, blood in stool, nausea and vomiting.       Epigastric pain  Genitourinary: Negative for dysuria.  Musculoskeletal: Negative for back pain, myalgias and neck pain.  Skin: Negative for itching and rash.  Neurological: Negative for dizziness, tingling and tremors.  Endo/Heme/Allergies: Negative for environmental allergies. Does not bruise/bleed easily.  Psychiatric/Behavioral: Negative for depression.    MEDICAL HISTORY:  Past Medical History:  Diagnosis Date  . ADHD (attention deficit hyperactivity disorder)   . Anxiety   . Bipolar 1 disorder ([facility])   . Cancer ([facility])    Hodgkin's Lymphoma 09/1998  . Depression   . Endometriosis   . GERD (gastroesophageal reflux disease)   . History of gastric bypass 2012  . Hodgkin's lymphoma ([facility]) 2000  . Hypothyroidism   . Pre-diabetes   . Tobacco use disorder   . Ulcer jejunum     SURGICAL HISTORY: Past Surgical History:  Procedure Laterality Date  . ABDOMINAL HYSTERECTOMY    . BUNIONECTOMY Right   . CARPAL TUNNEL RELEASE    . COLONOSCOPY     multiple  . ESOPHAGOGASTRODUODENOSCOPY (EGD) WITH PROPOFOL N/A 02/21/2017   Procedure: ESOPHAGOGASTRODUODENOSCOPY (EGD) WITH PROPOFOL;  Surgeon: Wohl, Darren, MD;    Location: [facility];  Service: Gastroenterology;  Laterality: N/A;  Pre-diabetic  . excision of neck mass  09/1998  . GASTRIC BYPASS  03/12/2011  . LAPAROSCOPIC SUPRACERVICAL HYSTERECTOMY  2008    and unilateral salpingoophorectomy/ endometriosis-Dr Kincius  . TONSILLECTOMY      SOCIAL HISTORY: Social History   Socioeconomic History  . Marital status: Married    Spouse name: allen  . Number of children: 2  . Years of education: Not on file  . Highest education level: Associate degree: occupational, Hotel manager, or vocational program  Occupational History  . Occupation: Administrator: [employer]  Social Needs  . Financial resource strain: Not hard at all  . Food insecurity:    Worry: Never true    Inability: Never true  . Transportation needs:    Medical: No    Non-medical: No  Tobacco Use  . Smoking status: Current Every Day Smoker    Packs/day: 0.50    Years: 15.00    Pack years: 7.50    Types: Cigarettes    Start date: 06/11/2000  . Smokeless tobacco: Never Used  Substance and Sexual Activity  . Alcohol use: No    Alcohol/week: 0.0 oz    Comment: rarely  . Drug use: No  . Sexual activity: Yes    Partners: Male    Birth control/protection: Surgical  Lifestyle  . Physical activity:    Days per week: 5 days    Minutes per session: 60 min  . Stress: Rather much  Relationships  . Social connections:    Talks on phone: Three times a week    Gets together: Once a week    Attends religious service: More than 4 times per year    Active member of club or organization: No    Attends meetings of clubs or organizations: Never    Relationship status: Married  . Intimate partner violence:    Fear of current or ex partner: No    Emotionally abused: No    Physically abused: No    Forced sexual activity: No  Other Topics Concern  . Not on file  Social History Narrative  . Not on file    FAMILY HISTORY: Family History  Problem Relation Age of Onset  . Cancer Mother 16       colon  . Epilepsy Mother   . Glaucoma Father   . Breast cancer Maternal Grandmother 83       inflamitory breast ca    ALLERGIES:  is allergic to  vicodin [hydrocodone-acetaminophen].  MEDICATIONS:  Current Outpatient Medications  Medication Sig Dispense Refill  . atorvastatin (LIPITOR) 40 MG tablet TAKE 1 TABLET BY MOUTH DAILY 90 tablet 1  . Cholecalciferol (VITAMIN D) 2000 units CAPS Take 1 capsule (2,000 Units total) by mouth daily. 30 capsule 0  . clonazePAM (KLONOPIN) 0.5 MG tablet Take 1 tablet (0.5 mg total) by mouth at bedtime. 30 tablet 5  . cyanocobalamin (,VITAMIN B-12,) 1000 MCG/ML injection   5  . cyclobenzaprine (FLEXERIL) 5 MG tablet Take 1 tablet (5 mg total) by mouth at bedtime. for muscle spams 90 tablet 1  . DULoxetine (CYMBALTA) 30 MG capsule Take 1 capsule (30 mg total) by mouth daily. 90 capsule 2  . estradiol (ESTRACE VAGINAL) 0.1 MG/GM vaginal cream Insert 1 gm vaginally qhs x 2 weeks then decrease to 1GM 3 times/week 42.5 g 1  . levothyroxine (SYNTHROID, LEVOTHROID) 25 MCG tablet TAKE 1 TABLET BY MOUTH DAILY  BEFORE BREAKFAST 90 tablet 0  . lithium carbonate (LITHOBID) 300 MG CR tablet Take 2 tablets (600 mg total) by mouth at bedtime. (Patient not taking: Reported on 01/09/2018) 180 tablet 3  . Multiple Vitamins-Minerals (MULTI-VITAMIN GUMMIES) CHEW Chew 2 capsules by mouth daily. 30 tablet 0  . omeprazole (PRILOSEC) 40 MG capsule Take 1 capsule (40 mg total) by mouth daily. 30 capsule 3  . oxyCODONE-acetaminophen (PERCOCET) 7.5-325 MG tablet Take 1 tablet by mouth every 6 (six) hours as needed. 20 tablet 0  . QUEtiapine (SEROQUEL) 50 MG tablet Take 1 tablet (50 mg total) by mouth at bedtime. 90 tablet 1  . SF 5000 PLUS 1.1 % CREA dental cream   3  . sucralfate (CARAFATE) 1 g tablet Take 1 tablet (1 g total) by mouth 4 (four) times daily. 120 tablet 2  . TRULICITY 1.5 MG/0.5ML SOPN Inject 1.5 mg into the skin once a week. 4 pen 5   No current facility-administered medications for this visit.      PHYSICAL EXAMINATION: ECOG PERFORMANCE STATUS: 1 - Symptomatic but completely ambulatory Vitals:   01/15/18 1331    BP: (!) 154/93  Pulse: 83  Resp: 18  Temp: (!) 97.4 F (36.3 C)   Filed Weights   01/15/18 1331  Weight: 134 lb 3.2 oz (60.9 kg)    Physical Exam  Constitutional: She is oriented to person, place, and time. She appears well-developed and well-nourished. No distress.  HENT:  Head: Normocephalic and atraumatic.  Right Ear: External ear normal.  Left Ear: External ear normal.  Mouth/Throat: Oropharynx is clear and moist.  Eyes: Pupils are equal, round, and reactive to light. Conjunctivae and EOM are normal. No scleral icterus.  Neck: Normal range of motion. Neck supple.  Cardiovascular: Normal rate, regular rhythm and normal heart sounds.  Pulmonary/Chest: Effort normal. No respiratory distress. She has no wheezes.  Abdominal: Soft. Bowel sounds are normal. She exhibits no distension and no mass. There is no tenderness.  Musculoskeletal: Normal range of motion. She exhibits no edema or deformity.  Neurological: She is alert and oriented to person, place, and time. No cranial nerve deficit. Coordination normal.  Skin: Skin is warm and dry. No rash noted. No erythema.  Psychiatric: She has a normal mood and affect. Her behavior is normal. Thought content normal.     LABORATORY DATA:  I have reviewed the data as listed Lab Results  Component Value Date   WBC 7.4 01/09/2018   HGB 10.7 (L) 01/09/2018   HCT 32.3 (L) 01/09/2018   MCV 83.5 01/09/2018   PLT 356 01/09/2018   Recent Labs    02/18/17 0949 01/09/18 1605  NA 137 140  K 4.2 4.0  CL 102 107  CO2 28 29  GLUCOSE 128* 82  BUN 17 8  CREATININE 0.61 0.63  CALCIUM 9.1 8.7*  GFRNONAA >60 >60  GFRAA >60 >60  PROT 7.1 7.0  ALBUMIN 3.5 3.5  AST 22 18  ALT 16 13  ALKPHOS 157* 206*  BILITOT 0.3 0.2*   Iron/TIBC/Ferritin/ %Sat    Component Value Date/Time   IRON 26 (L) 01/09/2018 1605   IRON 74 12/03/2012 0725   TIBC 371 01/09/2018 1605   TIBC 260 12/03/2012 0725   FERRITIN 4 (L) 01/09/2018 1605   FERRITIN 41  07/08/2013 1445   IRONPCTSAT 7 (L) 01/09/2018 1605   IRONPCTSAT 28 12/03/2012 0725    RADIOGRAPHIC STUDIES: I have personally reviewed the radiological images as listed and agreed with the   findings in the report. 02/18/2018  IMPRESSION: 1. Eccentric wall thickening at the posterior gastrojejunal anastomosis with surrounding fat stranding. Findings are nonspecific, most suggestive of gastrojejunal anastomotic ulcer disease, with neoplasm not excluded. Upper endoscopic or surgical correlation should be considered. No free air. No abscess. 2. Nonspecific new mild gastrohepatic ligament lymphadenopathy. 3. No evidence of bowel obstruction. 4. Stable large left adrenal adenoma.   ASSESSMENT & PLAN:  1. Iron deficiency anemia, unspecified iron deficiency anemia type   2. Hodgkin's disease in remission ([facility])   3. History of gastric bypass   4. Epigastric pain    #Iron deficiency anemia, suspect blood loss from GI tract.  Recommend IV iron Plan IV iron with Venofer 200mg weekly x 4 doses. Allergy reactions/infusion reaction including anaphylactic reaction discussed with patient. Other side effects include but not limited to high blood pressure, skin rash, weight gain, leg swelling, etc. Patient voices understanding and willing to proceed. #Epigastric pain/heartburn : Continue omeprazole and Carafate.  Recommend patient to continue follow-up with Dr. Wohl gastroenterology for further evaluation.  #History of Hodgkin's lymphoma in remission, CBC showed normocytic anemia which can be secondary to iron deficiency, mild lymphocytosis.  ESR elevated to 56 Peripheral blood flow cytometry negative.  Normal LDH Normal TSH She has scheduled repeat CT chest abdomen pelvis with IV contrast as a follow-up to evaluate if any persistent gastrohepatic ligament lymphadenopathy.   Orders Placed This Encounter  Procedures  . CBC with Differential/Platelet    Standing Status:   Future    Standing Expiration  Date:   01/16/2019  . Ferritin    Standing Status:   Future    Standing Expiration Date:   01/16/2019  . Iron and TIBC    Standing Status:   Future    Standing Expiration Date:   01/16/2019    All questions were answered. The patient knows to call the clinic with any problems questions or concerns.  Return of visit: 6 weeks    , MD, PhD Hematology Oncology [facility] Pager- [phone] 01/15/2018  

## 2018-01-21 ENCOUNTER — Other Ambulatory Visit: Payer: Self-pay | Admitting: *Deleted

## 2018-01-21 DIAGNOSIS — C8190 Hodgkin lymphoma, unspecified, unspecified site: Secondary | ICD-10-CM

## 2018-01-22 ENCOUNTER — Ambulatory Visit: Payer: 59

## 2018-01-24 ENCOUNTER — Ambulatory Visit: Payer: 59

## 2018-01-28 ENCOUNTER — Ambulatory Visit: Payer: 59

## 2018-01-29 ENCOUNTER — Inpatient Hospital Stay: Payer: 59

## 2018-01-29 ENCOUNTER — Ambulatory Visit: Admission: RE | Admit: 2018-01-29 | Payer: 59 | Source: Ambulatory Visit

## 2018-01-29 ENCOUNTER — Ambulatory Visit
Admission: RE | Admit: 2018-01-29 | Discharge: 2018-01-29 | Disposition: A | Discharge status: Home / Self Care | Payer: 59 | Source: Ambulatory Visit | Attending: Oncology | Admitting: Oncology

## 2018-01-29 VITALS — BP 132/78 | HR 96 | Temp 96.8°F | Resp 20

## 2018-01-29 DIAGNOSIS — C8190 Hodgkin lymphoma, unspecified, unspecified site: Secondary | ICD-10-CM | POA: Diagnosis not present

## 2018-01-29 DIAGNOSIS — K8020 Calculus of gallbladder without cholecystitis without obstruction: Secondary | ICD-10-CM | POA: Diagnosis not present

## 2018-01-29 DIAGNOSIS — D509 Iron deficiency anemia, unspecified: Secondary | ICD-10-CM

## 2018-01-29 MED ORDER — SODIUM CHLORIDE 0.9 % IV SOLN
Freq: Once | INTRAVENOUS | Status: AC
  Administered 2018-01-29: 12:00:00 via INTRAVENOUS
  Filled 2018-01-29: qty 250

## 2018-01-29 MED ORDER — IRON SUCROSE 20 MG/ML IV SOLN
200.0000 mg | Freq: Once | INTRAVENOUS | Status: AC
  Administered 2018-01-29: 200 mg via INTRAVENOUS
  Filled 2018-01-29: qty 10

## 2018-01-29 MED ORDER — SODIUM CHLORIDE 0.9 % IV SOLN: 200.0000 mg | Freq: Once | INTRAVENOUS | Status: DC

## 2018-02-07 ENCOUNTER — Ambulatory Visit: Payer: Self-pay | Admitting: Oncology

## 2018-02-07 ENCOUNTER — Inpatient Hospital Stay: Payer: 59

## 2018-02-07 VITALS — BP 107/66 | HR 87 | Temp 96.9°F | Resp 18

## 2018-02-07 DIAGNOSIS — C8190 Hodgkin lymphoma, unspecified, unspecified site: Secondary | ICD-10-CM | POA: Diagnosis not present

## 2018-02-07 DIAGNOSIS — D509 Iron deficiency anemia, unspecified: Secondary | ICD-10-CM

## 2018-02-07 MED ORDER — SODIUM CHLORIDE 0.9 % IV SOLN: 200.0000 mg | Freq: Once | INTRAVENOUS | Status: DC

## 2018-02-07 MED ORDER — SODIUM CHLORIDE 0.9 % IV SOLN
Freq: Once | INTRAVENOUS | Status: AC
  Administered 2018-02-07: 14:00:00 via INTRAVENOUS
  Filled 2018-02-07: qty 250

## 2018-02-07 MED ORDER — IRON SUCROSE 20 MG/ML IV SOLN
200.0000 mg | Freq: Once | INTRAVENOUS | Status: AC
  Administered 2018-02-07: 200 mg via INTRAVENOUS
  Filled 2018-02-07: qty 10

## 2018-02-14 ENCOUNTER — Inpatient Hospital Stay: Payer: 59 | Attending: Oncology

## 2018-02-14 VITALS — BP 111/74 | HR 83 | Temp 97.9°F | Resp 18

## 2018-02-14 DIAGNOSIS — Z9884 Bariatric surgery status: Secondary | ICD-10-CM | POA: Diagnosis not present

## 2018-02-14 DIAGNOSIS — D500 Iron deficiency anemia secondary to blood loss (chronic): Secondary | ICD-10-CM | POA: Insufficient documentation

## 2018-02-14 DIAGNOSIS — R590 Localized enlarged lymph nodes: Secondary | ICD-10-CM | POA: Insufficient documentation

## 2018-02-14 DIAGNOSIS — Z8571 Personal history of Hodgkin lymphoma: Secondary | ICD-10-CM | POA: Diagnosis not present

## 2018-02-14 DIAGNOSIS — D509 Iron deficiency anemia, unspecified: Secondary | ICD-10-CM

## 2018-02-14 DIAGNOSIS — R1013 Epigastric pain: Secondary | ICD-10-CM | POA: Insufficient documentation

## 2018-02-14 MED ORDER — SODIUM CHLORIDE 0.9 % IV SOLN
Freq: Once | INTRAVENOUS | Status: AC
  Administered 2018-02-14: 14:00:00 via INTRAVENOUS
  Filled 2018-02-14: qty 250

## 2018-02-14 MED ORDER — IRON SUCROSE 20 MG/ML IV SOLN
200.0000 mg | Freq: Once | INTRAVENOUS | Status: AC
  Administered 2018-02-14: 200 mg via INTRAVENOUS
  Filled 2018-02-14: qty 10

## 2018-02-14 MED ORDER — SODIUM CHLORIDE 0.9 % IV SOLN: 200.0000 mg | Freq: Once | INTRAVENOUS | Status: DC

## 2018-02-14 NOTE — Progress Notes (Signed)
Pt states that "my pee stinks like when you asparagus" and that bilateral ankles will swell after each Venofer infusion, which will resolve within a day with elevating feet. Dr. Tasia Catchings aware, okay to proceed with treatment, pt educated to increase water intake.

## 2018-02-15 ENCOUNTER — Other Ambulatory Visit: Payer: Self-pay | Admitting: Family Medicine

## 2018-02-15 DIAGNOSIS — M545 Low back pain, unspecified: Secondary | ICD-10-CM

## 2018-02-15 DIAGNOSIS — E038 Other specified hypothyroidism: Secondary | ICD-10-CM

## 2018-02-24 ENCOUNTER — Inpatient Hospital Stay: Payer: 59

## 2018-02-24 DIAGNOSIS — D509 Iron deficiency anemia, unspecified: Secondary | ICD-10-CM

## 2018-02-24 DIAGNOSIS — D500 Iron deficiency anemia secondary to blood loss (chronic): Secondary | ICD-10-CM | POA: Diagnosis not present

## 2018-02-24 LAB — CBC WITH DIFFERENTIAL/PLATELET
BASOS ABS: 0 10*3/uL (ref 0–0.1)
BASOS PCT: 1 %
Eosinophils Absolute: 0.2 10*3/uL (ref 0–0.7)
Eosinophils Relative: 4 %
HCT: 36.4 % (ref 35.0–47.0)
HEMOGLOBIN: 12.1 g/dL (ref 12.0–16.0)
LYMPHS PCT: 39 %
Lymphs Abs: 2.5 10*3/uL (ref 1.0–3.6)
MCH: 29.4 pg (ref 26.0–34.0)
MCHC: 33.2 g/dL (ref 32.0–36.0)
MCV: 88.7 fL (ref 80.0–100.0)
Monocytes Absolute: 0.4 10*3/uL (ref 0.2–0.9)
Monocytes Relative: 6 %
NEUTROS ABS: 3.3 10*3/uL (ref 1.4–6.5)
NEUTROS PCT: 50 %
Platelets: 284 10*3/uL (ref 150–440)
RBC: 4.11 MIL/uL (ref 3.80–5.20)
RDW: 19.1 % — ABNORMAL HIGH (ref 11.5–14.5)
WBC: 6.5 10*3/uL (ref 3.6–11.0)

## 2018-02-24 LAB — IRON AND TIBC
Iron: 48 ug/dL (ref 28–170)
Saturation Ratios: 17 % (ref 10.4–31.8)
TIBC: 285 ug/dL (ref 250–450)
UIBC: 237 ug/dL

## 2018-02-24 LAB — FERRITIN: FERRITIN: 51 ng/mL (ref 11–307)

## 2018-02-26 ENCOUNTER — Encounter: Payer: Self-pay | Admitting: Oncology

## 2018-02-26 ENCOUNTER — Inpatient Hospital Stay (HOSPITAL_BASED_OUTPATIENT_CLINIC_OR_DEPARTMENT_OTHER): Payer: 59 | Admitting: Oncology

## 2018-02-26 ENCOUNTER — Other Ambulatory Visit: Payer: Self-pay

## 2018-02-26 ENCOUNTER — Inpatient Hospital Stay: Payer: 59

## 2018-02-26 VITALS — BP 126/79 | HR 98 | Temp 97.9°F | Resp 18 | Wt 141.4 lb

## 2018-02-26 DIAGNOSIS — D500 Iron deficiency anemia secondary to blood loss (chronic): Secondary | ICD-10-CM | POA: Diagnosis not present

## 2018-02-26 DIAGNOSIS — R1013 Epigastric pain: Secondary | ICD-10-CM | POA: Diagnosis not present

## 2018-02-26 DIAGNOSIS — C8190 Hodgkin lymphoma, unspecified, unspecified site: Secondary | ICD-10-CM

## 2018-02-26 DIAGNOSIS — Z9884 Bariatric surgery status: Secondary | ICD-10-CM

## 2018-02-26 DIAGNOSIS — Z8571 Personal history of Hodgkin lymphoma: Secondary | ICD-10-CM

## 2018-02-26 DIAGNOSIS — R590 Localized enlarged lymph nodes: Secondary | ICD-10-CM

## 2018-02-26 DIAGNOSIS — K219 Gastro-esophageal reflux disease without esophagitis: Secondary | ICD-10-CM

## 2018-02-26 NOTE — Progress Notes (Signed)
Hematology/Oncology follow up note [facility] Telephone:([phone] Fax:([phone]   Patient Care Team: Steele Sizer, MD as PCP - General (Family Medicine) Dalia Heading, CNM as Midwife (Certified Nurse Midwife) Lucilla Lame, MD as Consulting Physician (Gastroenterology)  REFERRING PROVIDER: Steele Sizer, MD REASON FOR VISIT Follow up for management of history of Hodgkin lymphoma, abnormal CT scan and weight loss.   HISTORY OF PRESENTING ILLNESS:  Jessica [name] is a  52 y.o.  female with PMH listed below who was referred to me for evaluation of Hodgkin lymphoma   History of Hodgkin lymphoma, diagnosed in 2000, treated with ABVD x 4 cycles and mantle radiation. She moved to [facility] and has followed up with Dr.Yebane for a period of time.   History of gastric bypass in 2013, reports feeling chronic intermittent epigastric pain for the past 12 months.  In September 2019 she went to ER for epigastric pain evaluation. CT abdomen Pelvis showed essentric wall thickening at the gastrojejunal anastomosis. Non specific new mild gastrohepatic ligament lymphadenopathy.  Stable left adrenal adenoma.   Patient reports not having good appetite due to epigastric pain.  Most recent upper endoscopy by Dr.Wohl on 02/21/2017, showed gastric bypass with normal sized pouch and intact staple line.  One non bleeding jejunal ulcer with no stigmata of bleeding. NO specimen collected. She takes Carafate with minimal symptom relief.  Active daily smoker.  Weight is stable for the past 6 months, around 135-138 pounds. Denies night sweat, fever or chills.    INTERVAL HISTORY Jessica [name] is a 52 y.o. female who has above history reviewed by me today for follow-up of history of Hodgkin's lymphoma, weight loss and abnormal CT scan.  #During interval patient had ultrasound abdomen done. [CT as a follow-up to evaluate abdominal lymphadenopathy was not approved by  insurance]  #Epigastric pain, significantly improved after started on Carafate.  She also takes omeprazole. She has also gained 7 pounds during the interval Feeling good. #During interval patient has got IV iron.  Felt that fatigue has significantly improved.   Review of Systems  Constitutional: Negative for chills, fever, malaise/fatigue and weight loss.  HENT: Negative for nosebleeds and sore throat.   Eyes: Negative for double vision, photophobia and redness.  Respiratory: Negative for cough, shortness of breath and wheezing.   Cardiovascular: Negative for chest pain, palpitations and orthopnea.  Gastrointestinal: Negative for abdominal pain, blood in stool, heartburn, nausea and vomiting.       Epigastric pain  Genitourinary: Negative for dysuria.  Musculoskeletal: Negative for back pain, myalgias and neck pain.  Skin: Negative for itching and rash.  Neurological: Negative for dizziness, tingling and tremors.  Endo/Heme/Allergies: Negative for environmental allergies. Does not bruise/bleed easily.  Psychiatric/Behavioral: Negative for depression.    MEDICAL HISTORY:  Past Medical History:  Diagnosis Date  . ADHD (attention deficit hyperactivity disorder)   . Anxiety   . Bipolar 1 disorder ([place])   . Cancer ([place])    Hodgkin's Lymphoma 09/1998  . Depression   . Endometriosis   . GERD (gastroesophageal reflux disease)   . History of gastric bypass 2012  . Hodgkin's lymphoma ([place]) 2000  . Hypothyroidism   . Pre-diabetes   . Tobacco use disorder   . Ulcer jejunum     SURGICAL HISTORY: Past Surgical History:  Procedure Laterality Date  . ABDOMINAL HYSTERECTOMY    . BUNIONECTOMY Right   . CARPAL TUNNEL RELEASE    . COLONOSCOPY     multiple  .  ESOPHAGOGASTRODUODENOSCOPY (EGD) WITH PROPOFOL N/A 02/21/2017   Procedure: ESOPHAGOGASTRODUODENOSCOPY (EGD) WITH PROPOFOL;  Surgeon: Lucilla Lame, MD;  Location: [place];  Service: Gastroenterology;  Laterality: N/A;   Pre-diabetic  . excision of neck mass  09/1998  . GASTRIC BYPASS  03/12/2011  . LAPAROSCOPIC SUPRACERVICAL HYSTERECTOMY  2008   and unilateral salpingoophorectomy/ endometriosis-Dr Kincius  . TONSILLECTOMY      SOCIAL HISTORY: Social History   Socioeconomic History  . Marital status: Married    Spouse name: allen  . Number of children: 2  . Years of education: Not on file  . Highest education level: Associate degree: occupational, Hotel manager, or vocational program  Occupational History  . Occupation: Administrator: [employer]  Social Needs  . Financial resource strain: Not hard at all  . Food insecurity:    Worry: Never true    Inability: Never true  . Transportation needs:    Medical: No    Non-medical: No  Tobacco Use  . Smoking status: Current Every Day Smoker    Packs/day: 0.50    Years: 15.00    Pack years: 7.50    Types: Cigarettes    Start date: 06/11/2000  . Smokeless tobacco: Never Used  Substance and Sexual Activity  . Alcohol use: No    Alcohol/week: 0.0 standard drinks    Comment: rarely  . Drug use: No  . Sexual activity: Yes    Partners: Male    Birth control/protection: Surgical  Lifestyle  . Physical activity:    Days per week: 5 days    Minutes per session: 60 min  . Stress: Rather much  Relationships  . Social connections:    Talks on phone: Three times a week    Gets together: Once a week    Attends religious service: More than 4 times per year    Active member of club or organization: No    Attends meetings of clubs or organizations: Never    Relationship status: Married  . Intimate partner violence:    Fear of current or ex partner: No    Emotionally abused: No    Physically abused: No    Forced sexual activity: No  Other Topics Concern  . Not on file  Social History Narrative  . Not on file    FAMILY HISTORY: Family History  Problem Relation Age of Onset  . Cancer Mother 68       colon  .  Epilepsy Mother   . Glaucoma Father   . Breast cancer Maternal Grandmother 83       inflamitory breast ca    ALLERGIES:  is allergic to vicodin [hydrocodone-acetaminophen].  MEDICATIONS:  Current Outpatient Medications  Medication Sig Dispense Refill  . atorvastatin (LIPITOR) 40 MG tablet TAKE 1 TABLET BY MOUTH DAILY 90 tablet 1  . Cholecalciferol (VITAMIN D) 2000 units CAPS Take 1 capsule (2,000 Units total) by mouth daily. 30 capsule 0  . clonazePAM (KLONOPIN) 0.5 MG tablet Take 1 tablet (0.5 mg total) by mouth at bedtime. 30 tablet 5  . cyanocobalamin (,VITAMIN B-12,) 1000 MCG/ML injection   5  . cyclobenzaprine (FLEXERIL) 5 MG tablet TAKE 1 TABLET BY MOUTH AT BEDTIME AS NEEDED FOR MUSCLE SPASM 90 tablet 0  . DULoxetine (CYMBALTA) 30 MG capsule Take 1 capsule (30 mg total) by mouth daily. 90 capsule 2  . levothyroxine (SYNTHROID, LEVOTHROID) 25 MCG tablet TAKE 1 TABLET BY MOUTH EVERY DAY BEFORE BREAKFAST 90 tablet 0  .  Multiple Vitamins-Minerals (MULTI-VITAMIN GUMMIES) CHEW Chew 2 capsules by mouth daily. 30 tablet 0  . omeprazole (PRILOSEC) 40 MG capsule Take 1 capsule (40 mg total) by mouth daily. 30 capsule 3  . QUEtiapine (SEROQUEL) 50 MG tablet Take 1 tablet (50 mg total) by mouth at bedtime. 90 tablet 1  . sucralfate (CARAFATE) 1 g tablet Take 1 tablet (1 g total) by mouth 4 (four) times daily. 120 tablet 2  . TRULICITY 1.5 IN/8.6VE SOPN Inject 1.5 mg into the skin once a week. 4 pen 5  . estradiol (ESTRACE VAGINAL) 0.1 MG/GM vaginal cream Insert 1 gm vaginally qhs x 2 weeks then decrease to 1GM 3 times/week (Patient not taking: Reported on 02/26/2018) 42.5 g 1  . oxyCODONE-acetaminophen (PERCOCET) 7.5-325 MG tablet Take 1 tablet by mouth every 6 (six) hours as needed. 20 tablet 0  . SF 5000 PLUS 1.1 % CREA dental cream   3   No current facility-administered medications for this visit.      PHYSICAL EXAMINATION: ECOG PERFORMANCE STATUS: 1 - Symptomatic but completely  ambulatory Vitals:   02/26/18 1420  BP: 126/79  Pulse: 98  Resp: 18  Temp: 97.9 F (36.6 C)   Filed Weights   02/26/18 1420  Weight: 141 lb 6.4 oz (64.1 kg)    Physical Exam  Constitutional: She is oriented to person, place, and time. She appears well-developed. No distress.  HENT:  Head: Normocephalic and atraumatic.  Mouth/Throat: Oropharynx is clear and moist.  Eyes: Pupils are equal, round, and reactive to light. Conjunctivae and EOM are normal. No scleral icterus.  Neck: Normal range of motion. Neck supple.  Cardiovascular: Normal rate, regular rhythm and normal heart sounds.  Pulmonary/Chest: Effort normal. No respiratory distress. She has no wheezes.  Abdominal: Soft. Bowel sounds are normal. She exhibits no distension and no mass. There is no tenderness.  Musculoskeletal: Normal range of motion. She exhibits no edema or deformity.  Neurological: She is alert and oriented to person, place, and time. No cranial nerve deficit. Coordination normal.  Skin: Skin is warm and dry. No rash noted. No erythema.  Psychiatric: She has a normal mood and affect. Her behavior is normal. Thought content normal.     LABORATORY DATA:  I have reviewed the data as listed Lab Results  Component Value Date   WBC 6.5 02/24/2018   HGB 12.1 02/24/2018   HCT 36.4 02/24/2018   MCV 88.7 02/24/2018   PLT 284 02/24/2018   Recent Labs    01/09/18 1605  NA 140  K 4.0  CL 107  CO2 29  GLUCOSE 82  BUN 8  CREATININE 0.63  CALCIUM 8.7*  GFRNONAA >60  GFRAA >60  PROT 7.0  ALBUMIN 3.5  AST 18  ALT 13  ALKPHOS 206*  BILITOT 0.2*   Iron/TIBC/Ferritin/ %Sat    Component Value Date/Time   IRON 48 02/24/2018 0829   IRON 74 12/03/2012 0725   TIBC 285 02/24/2018 0829   TIBC 260 12/03/2012 0725   FERRITIN 51 02/24/2018 0829   FERRITIN 41 07/08/2013 1445   IRONPCTSAT 17 02/24/2018 0829   IRONPCTSAT 28 12/03/2012 0725    RADIOGRAPHIC STUDIES: I have personally reviewed the  radiological images as listed and agreed with the findings in the report. 02/18/2018  IMPRESSION: 1. Eccentric wall thickening at the posterior gastrojejunal anastomosis with surrounding fat stranding. Findings are nonspecific, most suggestive of gastrojejunal anastomotic ulcer disease, with neoplasm not excluded. Upper endoscopic or surgical correlation should be considered. No  free air. No abscess. 2. Nonspecific new mild gastrohepatic ligament lymphadenopathy. 3. No evidence of bowel obstruction. 4. Stable large left adrenal adenoma.   ASSESSMENT & PLAN:  1. Iron deficiency anemia due to chronic blood loss   2. Hodgkin's disease in remission ([place])   3. History of gastric bypass   4. Lymphadenopathy, abdominal   5. Epigastric pain    #Iron deficiency anemia, status post Venofer 200 mg weekly x4. Labs reviewed and discussed with patient.  Hemoglobin has significantly improved to 12.1.  Iron panel has also improved as well. Hold additional IV iron.  #Epigastric pain/GERD, appears that symptom has significantly improved.  Recommend patient continue omeprazole and Carafate.  She may has chronic blood loss from gastritis/esophagitis.  Recommend patient to continue follow-up with gastroenterology for evaluation.  #History of Hodgkin's lymphoma in remission,  ESR elevated to 56 Peripheral blood flow cytometry negative.  Normal LDH  #Abdominal lymphadenopathy on previous CT Abdominal complete ultrasound was obtained during interval.  Image was independently reviewed by me and discussed with patient. No acute hepatobiliary findings.  Minimal cholelithiasis without additional sonographic evidence to suggest cholecystitis. Given that ultrasound is not the best modality to evaluate and to follow-up the abdominal lymphadenopathy mentioned in previous CT, will recommend patient proceed a CT chest abdomen pelvis with IV contrast as a follow-up.   Orders Placed This Encounter  Procedures  . CBC  with Differential/Platelet    Standing Status:   Future    Standing Expiration Date:   02/27/2019  . Iron and TIBC    Standing Status:   Future    Standing Expiration Date:   02/27/2019  . Ferritin    Standing Status:   Future    Standing Expiration Date:   02/27/2019    All questions were answered. The patient knows to call the clinic with any problems questions or concerns.  Return of visit: 3 months Total face to face encounter time for this patient visit was 25 min. >50% of the time was  spent in counseling and coordination of care.   Earlie Server, MD, PhD Hematology Oncology [facility] Pager- [phone] 02/26/2018

## 2018-02-26 NOTE — Progress Notes (Signed)
Patient here for follow up. No changes since last visit.   

## 2018-03-05 ENCOUNTER — Encounter (HOSPITAL_COMMUNITY): Payer: Self-pay

## 2018-03-05 ENCOUNTER — Emergency Department (HOSPITAL_COMMUNITY): Payer: 59

## 2018-03-05 ENCOUNTER — Other Ambulatory Visit: Payer: Self-pay

## 2018-03-05 ENCOUNTER — Emergency Department (HOSPITAL_COMMUNITY)
Admission: EM | Admit: 2018-03-05 | Discharge: 2018-03-05 | Disposition: A | Discharge status: Home / Self Care | Payer: 59 | Attending: Emergency Medicine | Admitting: Emergency Medicine

## 2018-03-05 DIAGNOSIS — M25512 Pain in left shoulder: Secondary | ICD-10-CM | POA: Diagnosis not present

## 2018-03-05 DIAGNOSIS — Z79899 Other long term (current) drug therapy: Secondary | ICD-10-CM | POA: Insufficient documentation

## 2018-03-05 DIAGNOSIS — W108XXA Fall (on) (from) other stairs and steps, initial encounter: Secondary | ICD-10-CM | POA: Insufficient documentation

## 2018-03-05 DIAGNOSIS — Z8572 Personal history of non-Hodgkin lymphomas: Secondary | ICD-10-CM | POA: Diagnosis not present

## 2018-03-05 DIAGNOSIS — E039 Hypothyroidism, unspecified: Secondary | ICD-10-CM | POA: Insufficient documentation

## 2018-03-05 DIAGNOSIS — W19XXXA Unspecified fall, initial encounter: Secondary | ICD-10-CM

## 2018-03-05 DIAGNOSIS — F17210 Nicotine dependence, cigarettes, uncomplicated: Secondary | ICD-10-CM | POA: Insufficient documentation

## 2018-03-05 NOTE — ED Notes (Signed)
Pt taken to xray 

## 2018-03-05 NOTE — ED Triage Notes (Signed)
Pt reports tripped walking up stairs last night.  C/O pain left shoulder and R shin.  Denies any LOC, denies hitting head.

## 2018-03-05 NOTE — ED Provider Notes (Signed)
[facility] EMERGENCY DEPARTMENT Provider Note   CSN: [id number] Arrival date & time: 03/05/18  4970     History   Chief Complaint Chief Complaint  Patient presents with  . Fall    HPI Jessica [name] is a 52 y.o. female.  HPI Patient tripped and fell walking upstairs last night.  States her toe caught.  Her right shin hit the stairs.  Complaining some pain and swelling.  Also has more severe pain left shoulder.  No loss conscious.  No head or neck pain.  No numbness weakness.  Confusion.  She is not on anticoagulation.  No chest or abdominal pain. Past Medical History:  Diagnosis Date  . ADHD (attention deficit hyperactivity disorder)   . Anxiety   . Bipolar 1 disorder ([place])   . Cancer ([place])    Hodgkin's Lymphoma 09/1998  . Depression   . Endometriosis   . GERD (gastroesophageal reflux disease)   . History of gastric bypass 2012  . Hodgkin's lymphoma ([place]) 2000  . Hypothyroidism   . Pre-diabetes   . Tobacco use disorder   . Ulcer jejunum     Patient Active Problem List   Diagnosis Date Noted  . Iron deficiency anemia 01/09/2018  . Cervical high risk HPV (human papillomavirus) test positive 04/08/2017  . Abdominal pain, epigastric   . Bariatric surgery status   . Ulcer jejunum   . Adenoma of left adrenal gland 02/18/2017  . Abnormal TSH 08/30/2016  . Bipolar disorder ([place]) 12/02/2015  . HAV (hallux abducto valgus) 11/22/2015  . Bunion of great toe 11/08/2015  . Attention-deficit hyperactivity disorder, other type 10/28/2015  . Sciatica 04/22/2015  . B12 deficiency 12/01/2014  . History of bariatric surgery 12/01/2014  . Anxiety, generalized 12/01/2014  . Gastro-esophageal reflux disease without esophagitis 12/01/2014  . H/O elevated lipids 12/01/2014  . H/O: HTN (hypertension) 12/01/2014  . H/O: obesity 12/01/2014  . Hodgkin's disease in remission ([place]) 12/01/2014  . Current tobacco use 12/01/2014  . Insomnia 03/24/2008  . Abnormal serum level of  alkaline phosphatase 11/08/2006  . Chronic recurrent major depressive disorder ([place]) 11/08/2006    Past Surgical History:  Procedure Laterality Date  . ABDOMINAL HYSTERECTOMY    . BUNIONECTOMY Right   . CARPAL TUNNEL RELEASE    . COLONOSCOPY     multiple  . ESOPHAGOGASTRODUODENOSCOPY (EGD) WITH PROPOFOL N/A 02/21/2017   Procedure: ESOPHAGOGASTRODUODENOSCOPY (EGD) WITH PROPOFOL;  Surgeon: Lucilla Lame, MD;  Location: [place];  Service: Gastroenterology;  Laterality: N/A;  Pre-diabetic  . excision of neck mass  09/1998  . GASTRIC BYPASS  03/12/2011  . LAPAROSCOPIC SUPRACERVICAL HYSTERECTOMY  2008   and unilateral salpingoophorectomy/ endometriosis-Dr Kincius  . TONSILLECTOMY       OB History    Gravida  4   Para  2   Term  2   Preterm      AB  2   Living  2     SAB  2   TAB      Ectopic      Multiple      Live Births  2            Home Medications    Prior to Admission medications   Medication Sig Start Date End Date Taking? Authorizing Provider  atorvastatin (LIPITOR) 40 MG tablet TAKE 1 TABLET BY MOUTH DAILY 01/06/18  Yes Sowles, Drue Stager, MD  Cholecalciferol (VITAMIN D) 2000 units CAPS Take 1 capsule (2,000 Units total) by mouth daily. 01/22/17  Yes Steele Sizer, MD  clonazePAM (KLONOPIN) 0.5 MG tablet Take 1 tablet (0.5 mg total) by mouth at bedtime. 12/16/17  Yes Rainey Pines, MD  cyanocobalamin (,VITAMIN B-12,) 1000 MCG/ML injection Inject 1,000 mcg into the skin every 30 (thirty) days.  03/25/17  Yes [provider]  cyclobenzaprine (FLEXERIL) 5 MG tablet TAKE 1 TABLET BY MOUTH AT BEDTIME AS NEEDED FOR MUSCLE SPASM 02/16/18  Yes Sowles, Drue Stager, MD  DULoxetine (CYMBALTA) 30 MG capsule Take 1 capsule (30 mg total) by mouth daily. 12/16/17  Yes Rainey Pines, MD  levothyroxine (SYNTHROID, LEVOTHROID) 25 MCG tablet TAKE 1 TABLET BY MOUTH EVERY DAY BEFORE BREAKFAST 02/16/18  Yes Sowles, Drue Stager, MD  Multiple Vitamins-Minerals (MULTI-VITAMIN  GUMMIES) CHEW Chew 2 capsules by mouth daily. 01/22/17  Yes Sowles, Drue Stager, MD  omeprazole (PRILOSEC) 40 MG capsule Take 1 capsule (40 mg total) by mouth daily. 01/09/18  Yes Earlie Server, MD  QUEtiapine (SEROQUEL) 50 MG tablet Take 1 tablet (50 mg total) by mouth at bedtime. 12/16/17  Yes Rainey Pines, MD  sucralfate (CARAFATE) 1 g tablet Take 1 tablet (1 g total) by mouth 4 (four) times daily. 01/09/18  Yes Earlie Server, MD  TRULICITY 1.5 HA/1.9FX SOPN Inject 1.5 mg into the skin once a week. 12/16/17   Steele Sizer, MD    Family History Family History  Problem Relation Age of Onset  . Cancer Mother 16       colon  . Epilepsy Mother   . Glaucoma Father   . Breast cancer Maternal Grandmother 83       inflamitory breast ca    Social History Social History   Tobacco Use  . Smoking status: Current Every Day Smoker    Packs/day: 0.50    Years: 15.00    Pack years: 7.50    Types: Cigarettes    Start date: 06/11/2000  . Smokeless tobacco: Never Used  Substance Use Topics  . Alcohol use: No    Alcohol/week: 0.0 standard drinks    Comment: rarely  . Drug use: No     Allergies   Vicodin [hydrocodone-acetaminophen]   Review of Systems Review of Systems  Constitutional: Negative for appetite change.  HENT: Negative for trouble swallowing.   Respiratory: Negative for shortness of breath.   Cardiovascular: Negative for chest pain.  Gastrointestinal: Negative for abdominal pain.  Genitourinary: Negative for flank pain.  Musculoskeletal:       Left shoulder and right shin pain  Skin: Negative for rash.  Neurological: Negative for weakness.     Physical Exam Updated Vital Signs BP 122/75   Pulse 68   Temp 99.3 F (37.4 C) (Oral)   Resp 17   Ht 5\' 6"  (1.676 m)   Wt 58.1 kg   SpO2 100%   BMI 20.66 kg/m   Physical Exam  Constitutional: She appears well-developed.  HENT:  Head: Atraumatic.  Eyes: EOM are normal.  Neck: Neck supple.  Pulmonary/Chest: Effort normal.    Abdominal: There is no tenderness.  Musculoskeletal: She exhibits tenderness.  Tenderness over right mid tibia.  Tibia stable otherwise.  Mild ecchymosis anteriorly.  Tender over left AC joint of left shoulder.  Somewhat decreased range of motion shoulder due to pain.  Neurovascular intact in left hand.  No tenderness over elbow.  No tenderness over clavicle.  Neurological: She is alert.  Skin: Skin is warm. Capillary refill takes less than 2 seconds.     ED Treatments / Results  Labs (all labs ordered are  listed, but only abnormal results are displayed) Labs Reviewed - No data to display  EKG None  Radiology Dg Clavicle Left  Result Date: 03/05/2018 CLINICAL DATA:  Left shoulder pain status post fall. EXAM: LEFT CLAVICLE - 2+ VIEWS COMPARISON:  Shoulder radiographs same date. FINDINGS: There is no evidence of acute clavicle fracture. The distal clavicle appears slightly expanded, possibly due to an old fracture. There is no widening of the acromioclavicular joint. IMPRESSION: Possible old fracture of the distal clavicle. No evidence of acute fracture or dislocation. Electronically Signed   By: Richardean Sale M.D.   On: 03/05/2018 10:54   Dg Shoulder Left  Result Date: 03/05/2018 CLINICAL DATA:  Lateral shoulder pain. Patient tripped and fell last night. EXAM: LEFT SHOULDER - 2+ VIEW COMPARISON:  None. FINDINGS: The humeral head is located. There is no evidence of acute humeral fracture. The subacromial space is preserved. The distal clavicle appears somewhat irregular on the AP view and is not optimally evaluated on the additional views. There is no widening of the acromioclavicular joint. IMPRESSION: Possible fracture of the distal clavicle, not optimally evaluated on these views. Recommend dedicated clavicle radiographs. No evidence of glenohumeral fracture or dislocation. Electronically Signed   By: Richardean Sale M.D.   On: 03/05/2018 09:49    Procedures Procedures (including  critical care time)  Medications Ordered in ED Medications - No data to display   Initial Impression / Assessment and Plan / ED Course  I have reviewed the triage vital signs and the nursing notes.  Pertinent labs & imaging results that were available during my care of the patient were reviewed by me and considered in my medical decision making (see chart for details).     Patient with fall.  Lateral clavicle tenderness versus AC joint area.  X-ray had nonspecific findings.  Potential old fracture but patient states she is not aware of any fracture previously.  Will discharge home with Ortho follow-up.  Patient does have a history of malignancy.  Final Clinical Impressions(s) / ED Diagnoses   Final diagnoses:  Fall, initial encounter  Acute pain of left shoulder    ED Discharge Orders    None       Davonna Belling, MD 03/05/18 1515

## 2018-03-10 ENCOUNTER — Encounter: Payer: Self-pay | Admitting: Psychiatry

## 2018-03-10 ENCOUNTER — Ambulatory Visit (INDEPENDENT_AMBULATORY_CARE_PROVIDER_SITE_OTHER): Payer: 59 | Admitting: Psychiatry

## 2018-03-10 ENCOUNTER — Other Ambulatory Visit: Payer: Self-pay

## 2018-03-10 VITALS — BP 136/84 | HR 86 | Temp 99.1°F | Wt 137.8 lb

## 2018-03-10 DIAGNOSIS — F411 Generalized anxiety disorder: Secondary | ICD-10-CM | POA: Diagnosis not present

## 2018-03-10 DIAGNOSIS — F3162 Bipolar disorder, current episode mixed, moderate: Secondary | ICD-10-CM | POA: Diagnosis not present

## 2018-03-10 MED ORDER — HYDROXYZINE PAMOATE 25 MG PO CAPS: 25.0000 mg | ORAL_CAPSULE | Freq: Three times a day (TID) | ORAL | 0 refills | Status: DC | PRN

## 2018-03-10 MED ORDER — DULOXETINE HCL 20 MG PO CPEP: 40.0000 mg | ORAL_CAPSULE | Freq: Every day | ORAL | 0 refills | Status: DC

## 2018-03-10 MED ORDER — QUETIAPINE FUMARATE 100 MG PO TABS: 100.0000 mg | ORAL_TABLET | Freq: Every day | ORAL | 0 refills | Status: DC

## 2018-03-10 NOTE — Progress Notes (Signed)
[facility] OP Progress Note  03/10/2018 5:36 PM Jessica [name]  MRN:  [id number]  Chief Complaint: ' I am here for follow up." Chief Complaint    Follow-up; Medication Refill     HPI: Jessica [name] is a 52 year old Caucasian female, married, unemployed, lives in [place], has a history of mood disorder, generalized anxiety, Hodgkin's lymphoma in remission,GERD, of gastric bypass, endometriosis, hypothyroidism, prediabetes, presented to the clinic today to establish care.  Patient used to follow up with Dr. Gretel Acre.  Her last appointment was on 12/16/2017 and based on review of progress note dated 12/16/2017 patient was advised to follow-up in 6 months.  Patient today called the clinic asking to be seen sooner.  Patient hence was evaluated by Probation officer.  Patient reports she recently lost her job .  She reports she may have been fired due to inappropriate conduct.  Patient reports she has been struggling with mood lability, impulsivity, sleep problems all her life.  She is currently on Cymbalta, lithium, klonopin and seroquel.  She does not know if the medications are keeping her mood symptoms stable since she has significant mood lability.  She reports she wakes up every night at around 3 AM and is unable to fall back asleep.  She reports she does not feel tired the next morning and is able to go on.  Patient however reports the last few days since after getting fired from the job she has been extremely anxious.  She reports she hence took extra dosages of her Klonopin daily to keep her anxiety symptoms under control.  She reports she understands the consequences of taking medications like Klonopin more than what is prescribed as well as taking it for long-term.  She reports since she does not have health insurance anymore she will not be able to fill her Klonopin prescription anymore.  She reports she may have only 10 more pills of the Klonopin left and her next refill date is October 1 week.  She may have to pay out  of pocket to fill that prescription and does not know if she can afford it or not.  She hence reports she would like to gradually wean herself off of it.  Patient agrees to take it every other day or only as needed for the next few days.  She will monitor herself for withdrawal symptoms including confusion, restlessness and agitation, sleep problems and hallucinations.  Discussed with patient to go to the nearest emergency department if she notices any withdrawal symptoms.  Patient reports she does not remember when she had her lithium levels done.  She has been taking it every other day since she has some GI symptoms.  She reports she never mentioned this to her previous provider and continue to take it.  Discussed with patient that her lithium level needs to be ordered and hence will give her lab slip today.  Discussed referral for psychotherapy versus intensive outpatient program.  However patient does not know her health insurance status at this time.  Provided her information for [facility].  Also provided her information for patient assistance programs as well as medication management clinic for her medications .  Denies any suicidality, homicidality or perceptual disturbances.  Reports she has good social support from her husband.  Visit Diagnosis:    ICD-10-CM   1. Bipolar 1 disorder, mixed, moderate (HCC) F31.62   2. GAD (generalized anxiety disorder) F41.1     Past Psychiatric History: Patient denies inpatient mental health admissions in the past,  denies suicide attempts in the past.  Patient used to follow up with Dr. Gretel Acre here in our clinic.  Past trials of Prozac, Paxil, Effexor- worked the best but increased her LFTs, Cymbalta, Seroquel, Xanax.  Past Medical History:  Past Medical History:  Diagnosis Date  . ADHD (attention deficit hyperactivity disorder)   . Anxiety   . Bipolar 1 disorder ([place])   . Cancer ([place])    Hodgkin's Lymphoma 09/1998  . Depression   . Endometriosis   .  GERD (gastroesophageal reflux disease)   . History of gastric bypass 2012  . Hodgkin's lymphoma ([place]) 2000  . Hypothyroidism   . Pre-diabetes   . Tobacco use disorder   . Ulcer jejunum     Past Surgical History:  Procedure Laterality Date  . ABDOMINAL HYSTERECTOMY    . BUNIONECTOMY Right   . CARPAL TUNNEL RELEASE    . COLONOSCOPY     multiple  . ESOPHAGOGASTRODUODENOSCOPY (EGD) WITH PROPOFOL N/A 02/21/2017   Procedure: ESOPHAGOGASTRODUODENOSCOPY (EGD) WITH PROPOFOL;  Surgeon: Lucilla Lame, MD;  Location: [facility];  Service: Gastroenterology;  Laterality: N/A;  Pre-diabetic  . excision of neck mass  09/1998  . GASTRIC BYPASS  03/12/2011  . LAPAROSCOPIC SUPRACERVICAL HYSTERECTOMY  2008   and unilateral salpingoophorectomy/ endometriosis-Dr Kincius  . TONSILLECTOMY      Family Psychiatric History: Denies mental health problems in her family.  Family History:  Family History  Problem Relation Age of Onset  . Cancer Mother 59       colon  . Epilepsy Mother   . Glaucoma Father   . Breast cancer Maternal Grandmother 83       inflamitory breast ca    Social History: Patient is currently unemployed.  She recently got fired from her job.  She is married.  She lives in [place].  She has 2 adult children.  She reports good social support system from her family. Social History   Socioeconomic History  . Marital status: Married    Spouse name: allen  . Number of children: 2  . Years of education: Not on file  . Highest education level: Associate degree: occupational, Hotel manager, or vocational program  Occupational History  . Occupation: Administrator: [employer]  Social Needs  . Financial resource strain: Not hard at all  . Food insecurity:    Worry: Never true    Inability: Never true  . Transportation needs:    Medical: No    Non-medical: No  Tobacco Use  . Smoking status: Current Every Day Smoker    Packs/day: 0.50     Years: 15.00    Pack years: 7.50    Types: Cigarettes    Start date: 06/11/2000  . Smokeless tobacco: Never Used  Substance and Sexual Activity  . Alcohol use: No    Alcohol/week: 0.0 standard drinks    Comment: rarely  . Drug use: No  . Sexual activity: Yes    Partners: Male    Birth control/protection: Surgical  Lifestyle  . Physical activity:    Days per week: 5 days    Minutes per session: 60 min  . Stress: Rather much  Relationships  . Social connections:    Talks on phone: Three times a week    Gets together: Once a week    Attends religious service: More than 4 times per year    Active member of club or organization: No    Attends meetings of clubs or  organizations: Never    Relationship status: Married  Other Topics Concern  . Not on file  Social History Narrative  . Not on file    Allergies:  Allergies  Allergen Reactions  . Vicodin [Hydrocodone-Acetaminophen] Other (See Comments)    Has really bad headaches/ migraine      Metabolic Disorder Labs: Lab Results  Component Value Date   HGBA1C 5.3 08/08/2017   MPG 105.41 08/08/2017   MPG 105 08/29/2016   No results found for: PROLACTIN Lab Results  Component Value Date   CHOL 108 08/08/2017   TRIG 82 08/08/2017   HDL 44 08/08/2017   CHOLHDL 2.5 08/08/2017   VLDL 16 08/08/2017   LDLCALC 48 08/08/2017   LDLCALC 104 (H) 08/29/2016   Lab Results  Component Value Date   TSH 1.461 01/09/2018   TSH 0.498 08/08/2017    Therapeutic Level Labs: No results found for: LITHIUM No results found for: VALPROATE No components found for:  CBMZ  Current Medications: Current Outpatient Medications  Medication Sig Dispense Refill  . atorvastatin (LIPITOR) 40 MG tablet TAKE 1 TABLET BY MOUTH DAILY 90 tablet 1  . Cholecalciferol (VITAMIN D) 2000 units CAPS Take 1 capsule (2,000 Units total) by mouth daily. 30 capsule 0  . clonazePAM (KLONOPIN) 0.5 MG tablet Take 1 tablet (0.5 mg total) by mouth at bedtime. 30  tablet 5  . cyanocobalamin (,VITAMIN B-12,) 1000 MCG/ML injection Inject 1,000 mcg into the skin every 30 (thirty) days.   5  . cyclobenzaprine (FLEXERIL) 5 MG tablet TAKE 1 TABLET BY MOUTH AT BEDTIME AS NEEDED FOR MUSCLE SPASM 90 tablet 0  . DULoxetine (CYMBALTA) 20 MG capsule Take 2 capsules (40 mg total) by mouth daily. 180 capsule 0  . hydrOXYzine (VISTARIL) 25 MG capsule Take 1 capsule (25 mg total) by mouth 3 (three) times daily as needed (for severe anxiety symptoms and for sleep). 270 capsule 0  . levothyroxine (SYNTHROID, LEVOTHROID) 25 MCG tablet TAKE 1 TABLET BY MOUTH EVERY DAY BEFORE BREAKFAST 90 tablet 0  . Multiple Vitamins-Minerals (MULTI-VITAMIN GUMMIES) CHEW Chew 2 capsules by mouth daily. 30 tablet 0  . omeprazole (PRILOSEC) 40 MG capsule Take 1 capsule (40 mg total) by mouth daily. 30 capsule 3  . QUEtiapine (SEROQUEL) 100 MG tablet Take 1-1.5 tablets (100-150 mg total) by mouth at bedtime. Start taking Seroquel 100 mg for 1 week and increase to 150 mg after that 135 tablet 0  . sucralfate (CARAFATE) 1 g tablet Take 1 tablet (1 g total) by mouth 4 (four) times daily. 120 tablet 2  . traMADol (ULTRAM) 50 MG tablet Take by mouth.    . TRULICITY 1.5 WU/9.8JX SOPN Inject 1.5 mg into the skin once a week. 4 pen 5   No current facility-administered medications for this visit.      Musculoskeletal: Strength & Muscle Tone: within normal limits Gait & Station: normal Patient leans: N/A  Psychiatric Specialty Exam: Review of Systems  Psychiatric/Behavioral: Positive for depression. The patient is nervous/anxious and has insomnia.   All other systems reviewed and are negative.   Blood pressure 136/84, pulse 86, temperature 99.1 F (37.3 C), temperature source Oral, weight 137 lb 12.8 oz (62.5 kg).Body mass index is 22.24 kg/m.  General Appearance: Casual  Eye Contact:  Fair  Speech:  Normal Rate  Volume:  Normal  Mood:  Anxious and Dysphoric  Affect:  Tearful  Thought  Process:  Goal Directed and Descriptions of Associations: Intact  Orientation:  Full (Time,  Place, and Person)  Thought Content: Logical   Suicidal Thoughts:  No  Homicidal Thoughts:  No  Memory:  Immediate;   Fair Recent;   Fair Remote;   Fair  Judgement:  Fair  Insight:  Fair  Psychomotor Activity:  Normal  Concentration:  Concentration: Fair and Attention Span: Fair  Recall:  [employer] of Knowledge: Fair  Language: Fair  Akathisia:  No  Handed:  Right  AIMS (if indicated): denies tremors , rigidity,stiffness  Assets:  Communication Skills Desire for Improvement Social Support  ADL's:  Intact  Cognition: WNL  Sleep:  Poor   Screenings: GAD-7     Office Visit from 12/16/2017 in [facility] Office Visit from 04/24/2017 in [facility] Office Visit from 10/28/2015 in [facility]  Total GAD-7 Score  0  6  21    PHQ2-9     Office Visit from 12/16/2017 in [facility] Office Visit from 07/25/2017 in [facility] Office Visit from 04/24/2017 in [facility] Office Visit from 02/19/2017 in [facility] Office Visit from 08/28/2016 in [facility]  PHQ-2 Total Score  0  0  0  0  0  PHQ-9 Total Score  0  -  -  -  -       Assessment and Plan: Jessica [name] is a 52 year old Caucasian female, married, unemployed, lives in [place], has a history of bipolar disorder, generalized anxiety disorder, history of gastric bypass, GERD, hypothyroidism, prediabetes, Hodgkin's lymphoma in remission, presented to the clinic today for follow-up visit.  Patient used to follow up with Dr. Gretel Acre and call the clinic asking to be seen soon.  She reports she decided to come into the clinic since she had got fired from her job and her health insurance is no longer going to be there after today.  She hence wanted her medications refilled as soon as possible.  She also  reports she continues to struggle with mood symptoms and sleep problems and hence decided to get help.  Patient reports good social support, appears to be future oriented.  Patient denies any suicidality or homicidality.  Discussed plan with patient as noted below.  Plan For bipolar disorder Will increase Seroquel to 100 mg p.o. nightly for next 1 week and to 150 mg after a week Continue lithium 600 mg as scheduled.  She reports she has been taking it every other day since she has some GI issues.  Discussed with patient I cannot make any changes with her lithium today since she does not know if she will be able to follow-up in this clinic or not after today since she is not going to have her health insurance plan anymore. However provided the patient with the lab slip to get lithium levels done.  For anxiety disorder Increase Cymbalta to 40 mg p.o. Daily She will continue Klonopin as scheduled for now, discussed with patient to gradually taper it off.  She can take half a dose every other day or to use it as needed.  She does have refills left from previous prescription that was provided by Dr. Gretel Acre. I have reviewed [place] controlled substance database. Provided patient education about using benzodiazepines on long-term or more than what is prescribed.  She is aware about the side effects and complications including cognitive problems.  Patient reports that ever since she got fired she may have been taking an extra dose since she  was struggling with anxiety symptoms.  Start Vistaril 25 mg po tid prn for severe anxiety sx- she knows to limit use .   Discussed referral for intensive outpatient program  as well as psychotherapy/CBT.  Patient does not know about the status of her health insurance plan and will reach back to writer regarding the same.  Provided her information on patient assistance program with [facility].  Also provided her information for [facility].  Will discuss with Elmyra Ricks our therapist  regarding [facility] program in [place] and will let patient know.  Have reviewed medical records in [facility] Per Dr. Gretel Acre, and the most recent one dated 12/16/2017.  Follow-up in clinic in 2 weeks or sooner if needed.  More than 50 % of the time was spent for psychoeducation and supportive psychotherapy and care coordination.  This note was generated in part or whole with voice recognition software. Voice recognition is usually quite accurate but there are transcription errors that can and very often do occur. I apologize for any typographical errors that were not detected and corrected.         Ursula Alert, MD 03/10/2018, 5:36 PM

## 2018-03-11 ENCOUNTER — Telehealth: Payer: Self-pay | Admitting: Psychiatry

## 2018-03-11 NOTE — Telephone Encounter (Signed)
Called patient to provide contact information  For [facility] - [address] , [place] [address] [phone]. Left voicemail.

## 2018-03-13 ENCOUNTER — Ambulatory Visit: Payer: 59

## 2018-04-09 ENCOUNTER — Other Ambulatory Visit: Payer: Self-pay | Admitting: Oncology

## 2018-05-14 ENCOUNTER — Other Ambulatory Visit: Payer: Self-pay | Admitting: Family Medicine

## 2018-05-14 DIAGNOSIS — E038 Other specified hypothyroidism: Secondary | ICD-10-CM

## 2018-05-19 ENCOUNTER — Ambulatory Visit: Payer: 59 | Admitting: Psychiatry

## 2018-05-22 ENCOUNTER — Other Ambulatory Visit: Payer: Self-pay | Admitting: Psychiatry

## 2018-05-26 ENCOUNTER — Telehealth: Payer: Self-pay

## 2018-05-26 MED ORDER — QUETIAPINE FUMARATE 100 MG PO TABS: 150.0000 mg | ORAL_TABLET | Freq: Every day | ORAL | 0 refills | Status: DC

## 2018-05-26 NOTE — Telephone Encounter (Signed)
pt called left message that she needs refill on quetiapine. Next appt  07-31-17 last seen in sept.    QUEtiapine (SEROQUEL) 100 MG tablet  Medication  Date: 03/10/2018 Department: [facility] Ordering/Authorizing: Ursula Alert, MD  Order Providers   Prescribing Provider Encounter Provider  Ursula Alert, MD Ursula Alert, MD  Outpatient Medication Detail    Disp Refills Start End   QUEtiapine (SEROQUEL) 100 MG tablet 135 tablet 0 03/10/2018    Sig - Route: Take 1-1.5 tablets (100-150 mg total) by mouth at bedtime. Start taking Seroquel 100 mg for 1 week and increase to 150 mg after that - Oral   Sent to pharmacy as: QUEtiapine (SEROQUEL) 100 MG tablet   E-Prescribing Status: Receipt confirmed by pharmacy (03/10/2018 10:16 AM EDT)

## 2018-05-26 NOTE — Telephone Encounter (Signed)
Sent seroquel, however pt needs appointment

## 2018-05-28 ENCOUNTER — Inpatient Hospital Stay: Payer: Self-pay

## 2018-05-28 ENCOUNTER — Other Ambulatory Visit: Payer: Self-pay | Admitting: Oncology

## 2018-05-29 ENCOUNTER — Other Ambulatory Visit: Payer: Self-pay | Admitting: Oncology

## 2018-05-30 ENCOUNTER — Inpatient Hospital Stay: Payer: Self-pay

## 2018-05-30 ENCOUNTER — Inpatient Hospital Stay: Payer: Self-pay | Admitting: Oncology

## 2018-05-30 ENCOUNTER — Other Ambulatory Visit: Payer: Self-pay | Admitting: Psychiatry

## 2018-06-18 ENCOUNTER — Ambulatory Visit: Payer: Self-pay | Admitting: Family Medicine

## 2018-07-29 ENCOUNTER — Telehealth: Payer: Self-pay | Admitting: Family Medicine

## 2018-07-29 NOTE — Telephone Encounter (Signed)
Patient is switching jobs and is asking for 1 month supply of Trulicity 1.5 till her new insurance kicks in. She paid 700.00 the first time and she has now been without her meds 1 month

## 2018-07-29 NOTE — Telephone Encounter (Signed)
Spoke with [employer] about giving her 1 box of the 1.5 Trulicity and she said that it would be ok.

## 2018-07-30 ENCOUNTER — Encounter: Payer: Self-pay | Admitting: Family Medicine

## 2018-07-30 NOTE — Telephone Encounter (Signed)
Patient coming in tomorrow 07-31-2018

## 2018-07-31 ENCOUNTER — Ambulatory Visit: Payer: Self-pay | Admitting: Family Medicine

## 2018-07-31 ENCOUNTER — Ambulatory Visit: Payer: Self-pay | Admitting: Psychiatry

## 2018-07-31 ENCOUNTER — Encounter: Payer: Self-pay | Admitting: Family Medicine

## 2018-07-31 VITALS — BP 120/80 | HR 75 | Temp 98.4°F | Resp 16 | Ht 65.0 in | Wt 157.8 lb

## 2018-07-31 DIAGNOSIS — I700 Atherosclerosis of aorta: Secondary | ICD-10-CM

## 2018-07-31 DIAGNOSIS — E538 Deficiency of other specified B group vitamins: Secondary | ICD-10-CM

## 2018-07-31 DIAGNOSIS — C8190 Hodgkin lymphoma, unspecified, unspecified site: Secondary | ICD-10-CM

## 2018-07-31 DIAGNOSIS — M545 Low back pain, unspecified: Secondary | ICD-10-CM

## 2018-07-31 DIAGNOSIS — R7303 Prediabetes: Secondary | ICD-10-CM

## 2018-07-31 DIAGNOSIS — F3162 Bipolar disorder, current episode mixed, moderate: Secondary | ICD-10-CM

## 2018-07-31 DIAGNOSIS — E8881 Metabolic syndrome: Secondary | ICD-10-CM

## 2018-07-31 DIAGNOSIS — K219 Gastro-esophageal reflux disease without esophagitis: Secondary | ICD-10-CM

## 2018-07-31 DIAGNOSIS — E038 Other specified hypothyroidism: Secondary | ICD-10-CM

## 2018-07-31 DIAGNOSIS — R7989 Other specified abnormal findings of blood chemistry: Secondary | ICD-10-CM

## 2018-07-31 DIAGNOSIS — E559 Vitamin D deficiency, unspecified: Secondary | ICD-10-CM

## 2018-07-31 MED ORDER — LEVOTHYROXINE SODIUM 25 MCG PO TABS: ORAL_TABLET | ORAL | 0 refills | Status: DC

## 2018-07-31 MED ORDER — ATORVASTATIN CALCIUM 40 MG PO TABS: 40.0000 mg | ORAL_TABLET | Freq: Every day | ORAL | 1 refills | Status: DC

## 2018-07-31 MED ORDER — CYCLOBENZAPRINE HCL 5 MG PO TABS: ORAL_TABLET | ORAL | 0 refills | Status: DC

## 2018-07-31 MED ORDER — TRULICITY 1.5 MG/0.5ML ~~LOC~~ SOAJ: 1.5000 mg | SUBCUTANEOUS | 5 refills | Status: DC

## 2018-07-31 NOTE — Progress Notes (Signed)
Name: Jessica [name]   MRN: [id number]    DOB: May 07, 1966   Date:07/31/2018       Progress Note  Subjective  Chief Complaint  Chief Complaint  Patient presents with  . Medication Refill  . Lymphoma  . Manic Behavior  . Vitamin D Deficiency  . Pre-Diabetes    HPI  History of obesity: she had bariatric surgery 03/2011, maximum weight prior to surgery was 270 lbs, she went down to 142 lbs and was stable for many years, however since she was diagnosed with bipolarin 2017and was placedon antipsychotic medicationsandgained weight, March 2018 weight was 183 Lbs. She was onLevothyroxine and Metformin andwent down to 165 lbs in May 2018 and we started her on Trulicity to help curb appetite and help with pre-diabetes/insulin resistanceand shewasbelow her goal weight ( it was 140 lbs), down to 129 lbs, she was having nausea and epigastric pain, she finally saw Dr. Tasia Catchings, came off Nsaid's and was given carafate, omeprazole and given iron infusion. She is off Trulicity and weight is up again.   Pre-diabetes:hgbA1C was back in 2013 was 6.3%, but down to 5.3 % with weight loss , she was gradually gaining weight after bariatric surgery ( 2012 )  and we started her on Metformin 08/2016 and also Synthroid forelevatedTSH . She makes better meal choices and feels like the medication is working well for her.Having protein shakes daily, eating healthy. She has been off Metformin since end of 2018 to switch to Trulicity. She was doing well, her weight went down to 129 lbs (but she also had some epigastric pain - later diagnosed with ulcer) but has been out of Trulicity and weight is up to 157.8lbs today. We will give her some samples, but explained it will be difficult to cover the entire time that she will be without insurance.   B12 deficiency:not currently getting B12 injections but is getting SL B12 at home   HighTSH: associated with fatigue, and weight gain, started on Levothyroxine 08/2016  and is tolerating well, still tired, last TSH was at goal, she does not have insurance right now, we will recheck it in 3 months.   Hodgkin's lymphoma:she went back to see Dr. 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 Iron deficiency anemia 01/09/2018  . Cervical high risk HPV (human papillomavirus) test positive 04/08/2017  . Abdominal pain, epigastric   . Bariatric surgery status   . Ulcer jejunum   . Adenoma of left adrenal gland 02/18/2017  . Abnormal TSH 08/30/2016  . Bipolar disorder ([place]) 12/02/2015  . HAV (hallux abducto valgus) 11/22/2015  . Bunion of great toe 11/08/2015  . Attention-deficit hyperactivity disorder, other type 10/28/2015  . Sciatica 04/22/2015  . B12 deficiency 12/01/2014  . History of bariatric surgery 12/01/2014  . Anxiety, generalized 12/01/2014  . Gastro-esophageal reflux disease without esophagitis 12/01/2014  . H/O elevated lipids 12/01/2014  . H/O: HTN (hypertension) 12/01/2014  . H/O: obesity 12/01/2014  . Hodgkin's disease in remission ([place]) 12/01/2014  . Current  tobacco use 12/01/2014  . Insomnia 03/24/2008  . Abnormal serum level of alkaline phosphatase 11/08/2006  . Chronic recurrent major depressive disorder ([place]) 11/08/2006    Past Surgical History:  Procedure Laterality Date  . ABDOMINAL HYSTERECTOMY    . BUNIONECTOMY Right   . CARPAL TUNNEL RELEASE    . COLONOSCOPY     multiple  . ESOPHAGOGASTRODUODENOSCOPY (EGD) WITH PROPOFOL N/A 02/21/2017   Procedure: ESOPHAGOGASTRODUODENOSCOPY (EGD) WITH  PROPOFOL;  Surgeon: Lucilla Lame, MD;  Location: [facility];  Service: Gastroenterology;  Laterality: N/A;  Pre-diabetic  . excision of neck mass  09/1998  . GASTRIC BYPASS  03/12/2011  . LAPAROSCOPIC SUPRACERVICAL HYSTERECTOMY  2008   and unilateral salpingoophorectomy/ endometriosis-Dr Kincius  . TONSILLECTOMY      Family History  Problem Relation Age of Onset  . Cancer Mother 21       colon  . Epilepsy Mother   . Glaucoma Father   . Breast cancer Maternal Grandmother 83       inflamitory breast ca    Social History   Socioeconomic History  . Marital status: Married    Spouse name: allen  . Number of children: 2  . Years of education: Not on file  . Highest education level: Associate degree: occupational, Hotel manager, or vocational program  Occupational History  . Occupation: Administrator: [employer]  Social Needs  . Financial resource strain: Not hard at all  . Food insecurity:    Worry: Never true    Inability: Never true  . Transportation needs:    Medical: No    Non-medical: No  Tobacco Use  . Smoking status: Current Every Day Smoker    Packs/day: 0.50    Years: 15.00    Pack years: 7.50    Types: Cigarettes    Start date: 06/11/2000  . Smokeless tobacco: Never Used  Substance and Sexual Activity  . Alcohol use: No    Alcohol/week: 0.0 standard drinks    Comment: rarely  . Drug use: No  . Sexual activity: Yes    Partners: Male    Birth control/protection: Surgical  Lifestyle  . Physical activity:    Days per week: 2 days    Minutes per session: 30 min  . Stress: Rather much  Relationships  . Social connections:    Talks on phone: Three times a week    Gets together: Once a week    Attends religious service: More than 4 times per year    Active member of club or organization: No    Attends meetings of clubs or organizations: Never    Relationship status: Married  . Intimate partner violence:    Fear of  current or ex partner: No    Emotionally abused: No    Physically abused: No    Forced sexual activity: No  Other Topics Concern  . Not on file  Social History Narrative  . Not on file     Current Outpatient Medications:  .  Cholecalciferol (VITAMIN D) 2000 units CAPS, Take 1 capsule (2,000 Units total) by mouth daily., Disp: 30 capsule, Rfl: 0 .  DULoxetine (CYMBALTA) 20 MG capsule, TAKE 2 CAPSULES BY MOUTH EVERY DAY, Disp: 180 capsule, Rfl: 0 .  hydrOXYzine (VISTARIL) 25 MG capsule, TAKE 1 CAPSULE BY MOUTH 3 TIMES DAILY AS NEEDED (FOR SEVERE ANXIETY SYMPTOMS AND FOR SLEEP)., Disp: 270 capsule, Rfl: 0 .  levothyroxine (SYNTHROID, LEVOTHROID) 25 MCG tablet, TAKE 1 TABLET BY MOUTH  EVERY DAY BEFORE BREAKFAST, Disp: 90 tablet, Rfl: 0 .  Multiple Vitamins-Minerals (MULTI-VITAMIN GUMMIES) CHEW, Chew 2 capsules by mouth daily., Disp: 30 tablet, Rfl: 0 .  omeprazole (PRILOSEC) 40 MG capsule, TAKE 1 CAPSULE BY MOUTH EVERY DAY, Disp: 90 capsule, Rfl: 1 .  QUEtiapine (SEROQUEL) 100 MG tablet, Take 1.5 tablets (150 mg total) by mouth at bedtime., Disp: 135 tablet, Rfl: 0 .  sucralfate (CARAFATE) 1 g tablet, Take 1 tablet (1 g total) by mouth 4 (four) times daily., Disp: 120 tablet, Rfl: 2 .  atorvastatin (LIPITOR) 40 MG tablet, TAKE 1 TABLET BY MOUTH DAILY (Patient not taking: Reported on 07/31/2018), Disp: 90 tablet, Rfl: 1 .  clonazePAM (KLONOPIN) 0.5 MG tablet, Take 1 tablet (0.5 mg total) by mouth at bedtime. (Patient not taking: Reported on 07/31/2018), Disp: 30 tablet, Rfl: 5 .  cyanocobalamin (,VITAMIN B-12,) 1000 MCG/ML injection, Inject 1,000 mcg into the skin every 30 (thirty) days. , Disp: , Rfl: 5 .  cyclobenzaprine (FLEXERIL) 5 MG tablet, TAKE 1 TABLET BY MOUTH AT BEDTIME AS NEEDED FOR MUSCLE SPASM (Patient not taking: Reported on 07/31/2018), Disp: 90 tablet, Rfl: 0 .  traMADol (ULTRAM) 50 MG tablet, Take by mouth., Disp: , Rfl:  .  TRULICITY 1.5 IR/5.1OA SOPN, Inject 1.5 mg into the skin  once a week. (Patient not taking: Reported on 07/31/2018), Disp: 4 pen, Rfl: 5  Allergies  Allergen Reactions  . Vicodin [Hydrocodone-Acetaminophen] Other (See Comments)    Has really bad headaches/ migraine      I personally reviewed active problem list, medication list, allergies, family history, social history with the patient/caregiver today.   ROS  Constitutional: Negative for fever, positive for  weight change.  Respiratory: Negative for cough and shortness of breath.   Cardiovascular: Negative for chest pain or palpitations.  Gastrointestinal: Negative for abdominal pain, no bowel changes.  Musculoskeletal: Negative for gait problem or joint swelling.  Skin: Negative for rash.  Neurological: Negative for dizziness or headache.  No other specific complaints in a complete review of systems (except as listed in HPI above).  Objective  Vitals:   07/31/18 1050  BP: 120/80  Pulse: 75  Resp: 16  Temp: 98.4 F (36.9 C)  TempSrc: Oral  SpO2: 98%  Weight: 157 lb 12.8 oz (71.6 kg)  Height: 5\' 5"  (1.651 m)    Body mass index is 26.26 kg/m.  Physical Exam  Constitutional: Patient appears well-developed and well-nourished. Obese  No distress.  HEENT: head atraumatic, normocephalic, pupils equal and reactive to light,  neck supple, throat within normal limits Cardiovascular: Normal rate, regular rhythm and normal heart sounds.  No murmur heard. No BLE edema. Pulmonary/Chest: Effort normal and breath sounds normal. No respiratory distress. Abdominal: Soft.  There is no tenderness. Psychiatric: Patient has a normal mood and affect. behavior is normal. Judgment and thought content normal.  PHQ2/9: Depression screen [facility] 2/9 07/31/2018 12/16/2017 07/25/2017 04/24/2017 02/19/2017  Decreased Interest 1 0 0 0 0  Down, Depressed, Hopeless 1 0 0 0 0  PHQ - 2 Score 2 0 0 0 0  Altered sleeping 1 0 - - -  Tired, decreased energy 1 0 - - -  Change in appetite 3 0 - - -  Feeling bad or  failure about yourself  1 0 - - -  Trouble concentrating 1 0 - - -  Moving slowly or fidgety/restless 0 0 - - -  Suicidal thoughts 0 0 - - -  PHQ-9 Score 9 0 - - -  Difficult doing work/chores Not difficult at all - - - -    Fall Risk: Fall Risk  07/31/2018 12/16/2017 07/25/2017 04/24/2017 02/19/2017  Falls in the past year? 0 No No No No    Functional Status Survey: Is the patient deaf or have difficulty hearing?: No Does the patient have difficulty seeing, even when wearing glasses/contacts?: No Does the patient have difficulty concentrating, remembering, or making decisions?: No Does the patient have difficulty walking or climbing stairs?: No Does the patient have difficulty dressing or bathing?: No Does the patient have difficulty doing errands alone such as visiting a doctor's office or shopping?: No    Assessment & Plan  1. Atherosclerosis of abdominal aorta (HCC)  - atorvastatin (LIPITOR) 40 MG tablet; Take 1 tablet (40 mg total) by mouth daily.  Dispense: 90 tablet; Refill: 1  2. Recurrent low back pain  - cyclobenzaprine (FLEXERIL) 5 MG tablet; TAKE 1 TABLET BY MOUTH AT BEDTIME AS NEEDED FOR MUSCLE SPASM  Dispense: 90 tablet; Refill: 0  3. Other specified hypothyroidism  - levothyroxine (SYNTHROID, LEVOTHROID) 25 MCG tablet; Take it daily  Dispense: 90 tablet; Refill: 0  4. Insulin resistance  - TRULICITY 1.5 XY/5.8PF SOPN; Inject 1.5 mg into the skin once a week.  Dispense: 4 pen; Refill: 5  5. Prediabetes  - TRULICITY 1.5 YT/2.4MQ SOPN; Inject 1.5 mg into the skin once a week.  Dispense: 4 pen; Refill: 5  6. Vitamin D deficiency  On supplementation  7. B12 deficiency  On supplementation   8. Gastro-esophageal reflux disease without esophagitis  On PPI and carafate given by Dr. Tasia Catchings   9. Hodgkin's disease in remission [facility])  Seeing Dr. Tasia Catchings  10. Bipolar disorder, current episode mixed, moderate (HCC)  Seeing Dr. Shea Evans   11. Abnormal TSH  Recheck  next visit

## 2018-08-11 ENCOUNTER — Telehealth: Payer: Self-pay

## 2018-08-11 NOTE — Telephone Encounter (Signed)
pt called left message that she needs a refill on her medications send to [facility].

## 2018-08-11 NOTE — Telephone Encounter (Signed)
Please call patient to schedule an appointment and I can give meds to last until then. Has not seen her in several months . I have seen her only once. Thanks  pls dismiss if pt cannot schedule appointment

## 2018-08-14 ENCOUNTER — Ambulatory Visit: Payer: Self-pay | Admitting: Psychiatry

## 2018-08-19 ENCOUNTER — Other Ambulatory Visit: Payer: Self-pay | Admitting: Psychiatry

## 2018-08-29 ENCOUNTER — Ambulatory Visit: Payer: Self-pay | Admitting: Family Medicine

## 2018-09-04 ENCOUNTER — Encounter: Payer: Self-pay | Admitting: Psychiatry

## 2018-09-04 ENCOUNTER — Ambulatory Visit (INDEPENDENT_AMBULATORY_CARE_PROVIDER_SITE_OTHER): Payer: Self-pay | Admitting: Psychiatry

## 2018-09-04 ENCOUNTER — Other Ambulatory Visit: Payer: Self-pay

## 2018-09-04 DIAGNOSIS — F411 Generalized anxiety disorder: Secondary | ICD-10-CM

## 2018-09-04 DIAGNOSIS — F3162 Bipolar disorder, current episode mixed, moderate: Secondary | ICD-10-CM

## 2018-09-04 MED ORDER — DULOXETINE HCL 60 MG PO CPEP: 60.0000 mg | ORAL_CAPSULE | Freq: Every day | ORAL | 0 refills | Status: DC

## 2018-09-04 MED ORDER — GABAPENTIN 100 MG PO CAPS: 100.0000 mg | ORAL_CAPSULE | Freq: Two times a day (BID) | ORAL | 0 refills | Status: DC

## 2018-09-04 MED ORDER — QUETIAPINE FUMARATE 100 MG PO TABS: 150.0000 mg | ORAL_TABLET | Freq: Every day | ORAL | 0 refills | Status: DC

## 2018-09-04 NOTE — Patient Instructions (Signed)
Gabapentin capsules or tablets What is this medicine? GABAPENTIN (GA ba pen tin) is used to control seizures in certain types of epilepsy. It is also used to treat certain types of nerve pain. This medicine may be used for other purposes; ask your health care provider or pharmacist if you have questions. COMMON BRAND NAME(S): Active-PAC with Gabapentin, Gabarone, Neurontin What should I tell my health care provider before I take this medicine? They need to know if you have any of these conditions: -kidney disease -suicidal thoughts, plans, or attempt; a previous suicide attempt by you or a family member -an unusual or allergic reaction to gabapentin, other medicines, foods, dyes, or preservatives -pregnant or trying to get pregnant -breast-feeding How should I use this medicine? Take this medicine by mouth with a glass of water. Follow the directions on the prescription label. You can take it with or without food. If it upsets your stomach, take it with food. Take your medicine at regular intervals. Do not take it more often than directed. Do not stop taking except on your doctor's advice. If you are directed to break the 600 or 800 mg tablets in half as part of your dose, the extra half tablet should be used for the next dose. If you have not used the extra half tablet within 28 days, it should be thrown away. A special MedGuide will be given to you by the pharmacist with each prescription and refill. Be sure to read this information carefully each time. Talk to your pediatrician regarding the use of this medicine in children. While this drug may be prescribed for children as young as 3 years for selected conditions, precautions do apply. Overdosage: If you think you have taken too much of this medicine contact a poison control center or emergency room at once. NOTE: This medicine is only for you. Do not share this medicine with others. What if I miss a dose? If you miss a dose, take it as soon  as you can. If it is almost time for your next dose, take only that dose. Do not take double or extra doses. What may interact with this medicine? Do not take this medicine with any of the following medications: -other gabapentin products This medicine may also interact with the following medications: -alcohol -antacids -antihistamines for allergy, cough and cold -certain medicines for anxiety or sleep -certain medicines for depression or psychotic disturbances -homatropine; hydrocodone -naproxen -narcotic medicines (opiates) for pain -phenothiazines like chlorpromazine, mesoridazine, prochlorperazine, thioridazine This list may not describe all possible interactions. Give your health care provider a list of all the medicines, herbs, non-prescription drugs, or dietary supplements you use. Also tell them if you smoke, drink alcohol, or use illegal drugs. Some items may interact with your medicine. What should I watch for while using this medicine? Visit your doctor or health care professional for regular checks on your progress. You may want to keep a record at home of how you feel your condition is responding to treatment. You may want to share this information with your doctor or health care professional at each visit. You should contact your doctor or health care professional if your seizures get worse or if you have any new types of seizures. Do not stop taking this medicine or any of your seizure medicines unless instructed by your doctor or health care professional. Stopping your medicine suddenly can increase your seizures or their severity. Wear a medical identification bracelet or chain if you are taking this medicine for   seizures, and carry a card that lists all your medications. You may get drowsy, dizzy, or have blurred vision. Do not drive, use machinery, or do anything that needs mental alertness until you know how this medicine affects you. To reduce dizzy or fainting spells, do not  sit or stand up quickly, especially if you are an older patient. Alcohol can increase drowsiness and dizziness. Avoid alcoholic drinks. Your mouth may get dry. Chewing sugarless gum or sucking hard candy, and drinking plenty of water will help. The use of this medicine may increase the chance of suicidal thoughts or actions. Pay special attention to how you are responding while on this medicine. Any worsening of mood, or thoughts of suicide or dying should be reported to your health care professional right away. Women who become pregnant while using this medicine may enroll in the [place] Antiepileptic Drug Pregnancy Registry by calling 1-888-233-2334. This registry collects information about the safety of antiepileptic drug use during pregnancy. What side effects may I notice from receiving this medicine? Side effects that you should report to your doctor or health care professional as soon as possible: -allergic reactions like skin rash, itching or hives, swelling of the face, lips, or tongue -worsening of mood, thoughts or actions of suicide or dying Side effects that usually do not require medical attention (report to your doctor or health care professional if they continue or are bothersome): -constipation -difficulty walking or controlling muscle movements -dizziness -nausea -slurred speech -tiredness -tremors -weight gain This list may not describe all possible side effects. Call your doctor for medical advice about side effects. You may report side effects to FDA at 1-800-FDA-1088. Where should I keep my medicine? Keep out of reach of children. This medicine may cause accidental overdose and death if it taken by other adults, children, or pets. Mix any unused medicine with a substance like cat litter or coffee grounds. Then throw the medicine away in a sealed container like a sealed bag or a coffee can with a lid. Do not use the medicine after the expiration date. Store at room  temperature between 15 and 30 degrees C (59 and 86 degrees F). NOTE: This sheet is a summary. It may not cover all possible information. If you have questions about this medicine, talk to your doctor, pharmacist, or health care provider.  2019 Elsevier/Gold Standard (2017-10-31 13:21:44)  

## 2018-09-04 NOTE — Progress Notes (Signed)
Virtual Visit via Telephone Note  I connected with Jessica [name] on 09/04/18 at  4:30 PM EDT by telephone and verified that I am speaking with the correct person using two identifiers.   I discussed the limitations, risks, security and privacy concerns of performing an evaluation and management service by telephone and the availability of in person appointments. I also discussed with the patient that there may be a patient responsible charge related to this service. The patient expressed understanding and agreed to proceed.    I discussed the assessment and treatment plan with the patient. The patient was provided an opportunity to ask questions and all were answered. The patient agreed with the plan and demonstrated an understanding of the instructions.   The patient was advised to call back or seek an in-person evaluation if the symptoms worsen or if the condition fails to improve as anticipated.  I provided 15 minutes of non-face-to-face time during this encounter.    [facility] MD OP Progress Note  09/04/2018 5:01 PM JENNIKA RINGGOLD  MRN:  [id number]  Chief Complaint:  Chief Complaint    Follow-up     HPI: Jessica [name] is a 53 year old Caucasian female, married, unemployed, lives in [place], has a history of mood disorder, generalized anxiety disorder Hodgkin's lymphoma in remission, GERD, gastric bypass, endometriosis, hypothyroidism, prediabetes, was evaluated by phone today.   Patient today reports she continues to struggle with significant anxiety symptoms.  She reports with the pandemic going around and also losing her job she is very anxious.  She reports the reason she lost her jobs was also because of her anxiety symptoms.  Patient reports she continues to take her Cymbalta 40 mg and Seroquel as prescribed.  She is no longer on the lithium or Klonopin.  Patient reports she would like medication readjustment to help with her anxiety symptoms.  Patient was last seen on 03/10/2018.   At that time patient was recommended intensive  outpatient program or CBT and more frequent follow-up visit.  Patient however did not do any of those due to losing her health insurance plan.  Patient today reports she continues to does not have health insurance plan.  Hence discussed community resources.  Provided her information for [facility] as well as [facility].  However discussed medication changes with patient in the meantime.  She reports she will go to [facility] first thing in the morning tomorrow.  Patient denies any suicidality, homicidality or perceptual disturbances.  Visit Diagnosis:    ICD-10-CM   1. Bipolar 1 disorder, mixed, moderate (HCC) F31.62 QUEtiapine (SEROQUEL) 100 MG tablet    DULoxetine (CYMBALTA) 60 MG capsule    gabapentin (NEURONTIN) 100 MG capsule  2. GAD (generalized anxiety disorder) F41.1 QUEtiapine (SEROQUEL) 100 MG tablet    DULoxetine (CYMBALTA) 60 MG capsule    gabapentin (NEURONTIN) 100 MG capsule    Past Psychiatric History: I have reviewed past psychiatric history from my progress note on 03/10/2018.  Past trials of Prozac, Paxil, Effexor, Cymbalta, Seroquel, Xanax,lithium , Klonopin.  Past Medical History:  Past Medical History:  Diagnosis Date  . ADHD (attention deficit hyperactivity disorder)   . Anxiety   . Bipolar 1 disorder ([place])   . Cancer ([place])    Hodgkin's Lymphoma 09/1998  . Depression   . Endometriosis   . GERD (gastroesophageal reflux disease)   . History of gastric bypass 2012  . Hodgkin's lymphoma ([place]) 2000  . Hypothyroidism   . Pre-diabetes   . Tobacco use disorder   .  Ulcer jejunum     Past Surgical History:  Procedure Laterality Date  . ABDOMINAL HYSTERECTOMY    . BUNIONECTOMY Right   . CARPAL TUNNEL RELEASE    . COLONOSCOPY     multiple  . ESOPHAGOGASTRODUODENOSCOPY (EGD) WITH PROPOFOL N/A 02/21/2017   Procedure: ESOPHAGOGASTRODUODENOSCOPY (EGD) WITH PROPOFOL;  Surgeon: Lucilla Lame, MD;  Location: [facility];  Service:  Gastroenterology;  Laterality: N/A;  Pre-diabetic  . excision of neck mass  09/1998  . GASTRIC BYPASS  03/12/2011  . LAPAROSCOPIC SUPRACERVICAL HYSTERECTOMY  2008   and unilateral salpingoophorectomy/ endometriosis-Dr Kincius  . TONSILLECTOMY      Family Psychiatric History: Reviewed family psychiatric history from my progress note on 03/10/2018.  Family History:  Family History  Problem Relation Age of Onset  . Cancer Mother 48       colon  . Epilepsy Mother   . Glaucoma Father   . Breast cancer Maternal Grandmother 5       inflamitory breast ca    Social History: Reviewed social history from my progress note on 03/10/2018. Social History   Socioeconomic History  . Marital status: Married    Spouse name: allen  . Number of children: 2  . Years of education: Not on file  . Highest education level: Associate degree: occupational, Hotel manager, or vocational program  Occupational History  . Occupation: Administrator: [employer]  Social Needs  . Financial resource strain: Not hard at all  . Food insecurity:    Worry: Never true    Inability: Never true  . Transportation needs:    Medical: No    Non-medical: No  Tobacco Use  . Smoking status: Current Every Day Smoker    Packs/day: 0.50    Years: 15.00    Pack years: 7.50    Types: Cigarettes    Start date: 06/11/2000  . Smokeless tobacco: Never Used  Substance and Sexual Activity  . Alcohol use: No    Alcohol/week: 0.0 standard drinks    Comment: rarely  . Drug use: No  . Sexual activity: Yes    Partners: Male    Birth control/protection: Surgical  Lifestyle  . Physical activity:    Days per week: 2 days    Minutes per session: 30 min  . Stress: Rather much  Relationships  . Social connections:    Talks on phone: Three times a week    Gets together: Once a week    Attends religious service: More than 4 times per year    Active member of club or organization: No    Attends  meetings of clubs or organizations: Never    Relationship status: Married  Other Topics Concern  . Not on file  Social History Narrative  . Not on file    Allergies:  Allergies  Allergen Reactions  . Vicodin [Hydrocodone-Acetaminophen] Other (See Comments)    Has really bad headaches/ migraine      Metabolic Disorder Labs: Lab Results  Component Value Date   HGBA1C 5.3 08/08/2017   MPG 105.41 08/08/2017   MPG 105 08/29/2016   No results found for: PROLACTIN Lab Results  Component Value Date   CHOL 108 08/08/2017   TRIG 82 08/08/2017   HDL 44 08/08/2017   CHOLHDL 2.5 08/08/2017   VLDL 16 08/08/2017   LDLCALC 48 08/08/2017   LDLCALC 104 (H) 08/29/2016   Lab Results  Component Value Date   TSH 1.461 01/09/2018   TSH 0.498  08/08/2017    Therapeutic Level Labs: No results found for: LITHIUM No results found for: VALPROATE No components found for:  CBMZ  Current Medications: Current Outpatient Medications  Medication Sig Dispense Refill  . atorvastatin (LIPITOR) 40 MG tablet Take 1 tablet (40 mg total) by mouth daily. 90 tablet 1  . Cholecalciferol (VITAMIN D) 2000 units CAPS Take 1 capsule (2,000 Units total) by mouth daily. 30 capsule 0  . cyanocobalamin (,VITAMIN B-12,) 1000 MCG/ML injection Inject 1,000 mcg into the skin every 30 (thirty) days.   5  . cyclobenzaprine (FLEXERIL) 5 MG tablet TAKE 1 TABLET BY MOUTH AT BEDTIME AS NEEDED FOR MUSCLE SPASM 90 tablet 0  . DULoxetine (CYMBALTA) 60 MG capsule Take 1 capsule (60 mg total) by mouth daily. 90 capsule 0  . gabapentin (NEURONTIN) 100 MG capsule Take 1 capsule (100 mg total) by mouth 2 (two) times daily. 180 capsule 0  . hydrOXYzine (VISTARIL) 25 MG capsule TAKE 1 CAPSULE BY MOUTH 3 TIMES DAILY AS NEEDED (FOR SEVERE ANXIETY SYMPTOMS AND FOR SLEEP). 270 capsule 0  . levothyroxine (SYNTHROID, LEVOTHROID) 25 MCG tablet Take it daily 90 tablet 0  . Multiple Vitamins-Minerals (MULTI-VITAMIN GUMMIES) CHEW Chew 2  capsules by mouth daily. 30 tablet 0  . omeprazole (PRILOSEC) 40 MG capsule TAKE 1 CAPSULE BY MOUTH EVERY DAY 90 capsule 1  . QUEtiapine (SEROQUEL) 100 MG tablet Take 1.5 tablets (150 mg total) by mouth at bedtime. 135 tablet 0  . sucralfate (CARAFATE) 1 g tablet Take 1 tablet (1 g total) by mouth 4 (four) times daily. 120 tablet 2  . TRULICITY 1.5 TI/1.4ER SOPN Inject 1.5 mg into the skin once a week. 4 pen 5   No current facility-administered medications for this visit.      Musculoskeletal: Strength & Muscle Tone: unable to assess Gait & Station: unable to assess Patient leans: unable to assess  Psychiatric Specialty Exam: Review of Systems  Psychiatric/Behavioral: Positive for depression. Negative for hallucinations and suicidal ideas. The patient is nervous/anxious.   All other systems reviewed and are negative.   There were no vitals taken for this visit.There is no height or weight on file to calculate BMI.  General Appearance: unable to assess  Eye Contact:  unable to assess  Speech:  Clear and Coherent  Volume:  Normal  Mood:  Anxious and Depressed  Affect:  unable to assess  Thought Process:  Goal Directed and Descriptions of Associations: Intact  Orientation:  Full (Time, Place, and Person)  Thought Content: Logical   Suicidal Thoughts:  No  Homicidal Thoughts:  No  Memory:  Immediate;   Fair Recent;   Fair Remote;   Fair  Judgement:  Fair  Insight:  Fair  Psychomotor Activity:  Unable to assess  Concentration:  Concentration: Fair and Attention Span: Fair  Recall:  [employer] of Knowledge: Fair  Language: Fair  Akathisia:  No  Handed:  Right  AIMS (if indicated): unble to assess  Assets:  Communication Skills Desire for Improvement Social Support  ADL's:  Intact  Cognition: WNL  Sleep:  Fair   Screenings: GAD-7     Office Visit from 07/31/2018 in [facility] Office Visit from 12/16/2017 in [facility] Office Visit  from 04/24/2017 in [facility] Office Visit from 10/28/2015 in [facility]  Total GAD-7 Score  6  0  6  21    PHQ2-9     [facility] Visit from  07/31/2018 in [facility] Office Visit from 12/16/2017 in [facility] Office Visit from 07/25/2017 in [facility] Office Visit from 04/24/2017 in [facility] Office Visit from 02/19/2017 in [facility]  PHQ-2 Total Score  2  0  0  0  0  PHQ-9 Total Score  9  0  -  -  -       Assessment and Plan: Jessica [name] is a 53 yr old [place] female, married, unemployed, lives in [place], has a history of bipolar disorder, generalized anxiety disorder, history of gastric bypass, GERD, hypothyroidism, Hodgkin's lymphoma, was evaluated by phone today.  The last time patient was seen in clinic was on 03/10/2018.  Patient continues to struggle with anxiety and depressive symptoms and is noncompliant with treatment recommendations.  Provided her resources for the community again.  Discussed plan as noted below.  Plan For bipolar disorder-unstable Increase Seroquel to 150 mg p.o. nightly.   For anxiety disorder-unstable Increase Cymbalta to 60 mg p.o. daily. Start gabapentin 100 mg p.o. twice daily.  Provided her [facility] and [facility] numbers.  She reports she will go there tomorrow morning.  Discussed with her that she needs psychotherapy sessions as well as medication management on a frequent basis.  She agrees with plan.  I have spent atleast 15 minutes non face to face with patient today. More than 50 % of the time was spent for psychoeducation and supportive psychotherapy and care coordination.  This note was generated in part or whole with voice recognition software. Voice recognition is usually quite accurate but there are transcription errors that can and very often do occur. I apologize for any typographical errors that were not  detected and corrected.         Ursula Alert, MD 09/04/2018, 5:01 PM

## 2018-09-11 NOTE — Telephone Encounter (Signed)
Per dr. Shea Evans pt has been dismissed due to not being able to come in as advised.

## 2018-10-30 ENCOUNTER — Other Ambulatory Visit: Payer: Self-pay

## 2018-10-30 ENCOUNTER — Encounter: Payer: Self-pay | Admitting: Family Medicine

## 2018-10-30 ENCOUNTER — Ambulatory Visit (INDEPENDENT_AMBULATORY_CARE_PROVIDER_SITE_OTHER): Payer: Self-pay | Admitting: Family Medicine

## 2018-10-30 VITALS — Wt 155.0 lb

## 2018-10-30 DIAGNOSIS — R7303 Prediabetes: Secondary | ICD-10-CM

## 2018-10-30 DIAGNOSIS — M545 Low back pain, unspecified: Secondary | ICD-10-CM

## 2018-10-30 DIAGNOSIS — F411 Generalized anxiety disorder: Secondary | ICD-10-CM

## 2018-10-30 DIAGNOSIS — E559 Vitamin D deficiency, unspecified: Secondary | ICD-10-CM

## 2018-10-30 DIAGNOSIS — C819A Hodgkin lymphoma, unspecified, in remission: Secondary | ICD-10-CM

## 2018-10-30 DIAGNOSIS — I700 Atherosclerosis of aorta: Secondary | ICD-10-CM

## 2018-10-30 DIAGNOSIS — E038 Other specified hypothyroidism: Secondary | ICD-10-CM

## 2018-10-30 DIAGNOSIS — F3162 Bipolar disorder, current episode mixed, moderate: Secondary | ICD-10-CM

## 2018-10-30 DIAGNOSIS — Z79899 Other long term (current) drug therapy: Secondary | ICD-10-CM

## 2018-10-30 DIAGNOSIS — Z9884 Bariatric surgery status: Secondary | ICD-10-CM

## 2018-10-30 DIAGNOSIS — E538 Deficiency of other specified B group vitamins: Secondary | ICD-10-CM

## 2018-10-30 DIAGNOSIS — C8190 Hodgkin lymphoma, unspecified, unspecified site: Secondary | ICD-10-CM

## 2018-10-30 MED ORDER — DULOXETINE HCL 60 MG PO CPEP: 60.0000 mg | ORAL_CAPSULE | Freq: Every day | ORAL | 0 refills | Status: DC

## 2018-10-30 MED ORDER — GABAPENTIN 100 MG PO CAPS: 100.0000 mg | ORAL_CAPSULE | Freq: Two times a day (BID) | ORAL | 0 refills | Status: DC

## 2018-10-30 MED ORDER — CYCLOBENZAPRINE HCL 5 MG PO TABS: ORAL_TABLET | ORAL | 0 refills | Status: DC

## 2018-10-30 MED ORDER — LEVOTHYROXINE SODIUM 25 MCG PO TABS: ORAL_TABLET | ORAL | 0 refills | Status: DC

## 2018-10-30 MED ORDER — ATORVASTATIN CALCIUM 40 MG PO TABS: 40.0000 mg | ORAL_TABLET | Freq: Every day | ORAL | 0 refills | Status: DC

## 2018-10-30 MED ORDER — QUETIAPINE FUMARATE 100 MG PO TABS: 150.0000 mg | ORAL_TABLET | Freq: Every day | ORAL | 0 refills | Status: DC

## 2018-10-30 NOTE — Progress Notes (Signed)
Name: Jessica [name]   MRN: [id number]    DOB: Dec 25, 1965   Date:10/30/2018       Progress Note  Subjective  Chief Complaint  Chief Complaint  Patient presents with  . Follow-up  . Depression    no longer see's psych so wants to see if you will take over  . Medication Refill    I connected with  [name]  on 10/30/18 at  7:40 AM EDT by a video enabled telemedicine application and verified that I am speaking with the correct person using two identifiers.  I discussed the limitations of evaluation and management by telemedicine and the availability of in person appointments. The patient expressed understanding and agreed to proceed. Staff also discussed with the patient that there may be a patient responsible charge related to this service. Patient Location: at home  Provider Location: [facility]   HPI  History of obesity: she had bariatric surgery 03/2011, maximum weight prior to surgery was 270 lbs, she went down to 142 lbs and was stable for many years, however since she was diagnosed with bipolarin 2017and was placedon antipsychotic medicationsandgained weight, March 2018 weight was 183 Lbs. She was onLevothyroxine and Metformin andwent down to 165 lbs in May 2018 and we started her on Trulicity to help curb appetite and help with pre-diabetes/insulin resistanceand shewasbelow her goal weight ( it was 140 lbs), down to 129 lbs, she was having nausea and epigastric pain, she finally saw Dr. Tasia Catchings, came off Nsaid's and was given carafate, omeprazole for treatment was ulcer and given iron infusion. She is now back on Trulicity but because of cost she has been using it only every 10 days, she would like assistance with the cost. She states she has been eating balanced meals and walking daily, her weight is stable now at 155 lbs  Pre-diabetes:hgbA1C was back in 2013 was 6.3%, but down to 5.3 % with weight loss , she was gradually gaining weight after  bariatric surgery ( 2012 )  and we started her on Metformin 08/2016 and also Synthroid forelevatedTSH . She makes better meal choices and feels like the medication is working well for her.Having protein shakes daily, eating healthy. She has been off Metformin since end of 2018 to switch to Trulicity. She was doing well, her weight went down to 129 lbs (but she also had some epigastric pain - later diagnosed with ulcer)  She would like to continue Trulicity but explained that we can go back to Metformin if unable to get assistance with cost of Trulicity and she agrees  B12 deficiency:not currently getting B12 injections but is getting SL B12 at home , no fatigue or numbness   HighTSH: associated with fatigue, and weight gain, started on Levothyroxine 08/2016 and is tolerating well, still tired,last TSH was at goal, continue current dose and recheck level   Hodgkin's lymphoma:she went back to see Dr. Tasia Catchings last year, found to be very anemic, last iron studies was back to normal, advised to recheck but she wants to hold off on that   Bipolar: seeing Dr.Eappen, last visit was 08/2018, she still has one month supply of medications but since she lost her job and her insurance she asked me to take over the therapy. She prefers not going to [facility] at this time, but agrees to go back to psychiatrist if current regiment stops working. She also agrees on getting labs done. She states her mood has been stable, not stressed about  not working.  Atherosclerosis of aorta: not on statin therapy, advised her to recheck labs  Patient Active Problem List   Diagnosis Date Noted  . Iron deficiency anemia 01/09/2018  . Cervical high risk HPV (human papillomavirus) test positive 04/08/2017  . Abdominal pain, epigastric   . Bariatric surgery status   . Ulcer jejunum   . Adenoma of left adrenal gland 02/18/2017  . Abnormal TSH 08/30/2016  . Bipolar disorder ([place]) 12/02/2015  . HAV (hallux abducto valgus) 11/22/2015   . Bunion of great toe 11/08/2015  . Attention-deficit hyperactivity disorder, other type 10/28/2015  . Sciatica 04/22/2015  . B12 deficiency 12/01/2014  . History of bariatric surgery 12/01/2014  . Anxiety, generalized 12/01/2014  . Gastro-esophageal reflux disease without esophagitis 12/01/2014  . H/O elevated lipids 12/01/2014  . H/O: HTN (hypertension) 12/01/2014  . H/O: obesity 12/01/2014  . Hodgkin's disease in remission ([place]) 12/01/2014  . Current tobacco use 12/01/2014  . Insomnia 03/24/2008  . Abnormal serum level of alkaline phosphatase 11/08/2006  . Chronic recurrent major depressive disorder ([place]) 11/08/2006    Past Surgical History:  Procedure Laterality Date  . ABDOMINAL HYSTERECTOMY    . BUNIONECTOMY Right   . CARPAL TUNNEL RELEASE    . COLONOSCOPY     multiple  . ESOPHAGOGASTRODUODENOSCOPY (EGD) WITH PROPOFOL N/A 02/21/2017   Procedure: ESOPHAGOGASTRODUODENOSCOPY (EGD) WITH PROPOFOL;  Surgeon: Lucilla Lame, MD;  Location: [facility];  Service: Gastroenterology;  Laterality: N/A;  Pre-diabetic  . excision of neck mass  09/1998  . GASTRIC BYPASS  03/12/2011  . LAPAROSCOPIC SUPRACERVICAL HYSTERECTOMY  2008   and unilateral salpingoophorectomy/ endometriosis-Dr Kincius  . TONSILLECTOMY      Family History  Problem Relation Age of Onset  . Cancer Mother 52       colon  . Epilepsy Mother   . Multiple myeloma Mother   . Glaucoma Father   . Breast cancer Maternal Grandmother 83       inflamitory breast ca    Social History   Socioeconomic History  . Marital status: Married    Spouse name: allen  . Number of children: 2  . Years of education: Not on file  . Highest education level: Associate degree: occupational, Hotel manager, or vocational program  Occupational History  . Not on file  Social Needs  . Financial resource strain: Not hard at all  . Food insecurity:    Worry: Never true    Inability: Never true  . Transportation needs:    Medical:  No    Non-medical: No  Tobacco Use  . Smoking status: Current Every Day Smoker    Packs/day: 0.50    Years: 15.00    Pack years: 7.50    Types: Cigarettes    Start date: 06/11/2000  . Smokeless tobacco: Never Used  Substance and Sexual Activity  . Alcohol use: No    Alcohol/week: 0.0 standard drinks    Comment: rarely  . Drug use: No  . Sexual activity: Yes    Partners: Male    Birth control/protection: Surgical  Lifestyle  . Physical activity:    Days per week: 7 days    Minutes per session: 40 min  . Stress: Only a little  Relationships  . Social connections:    Talks on phone: Three times a week    Gets together: Once a week    Attends religious service: More than 4 times per year    Active member of club or organization: No  Attends meetings of clubs or organizations: Never    Relationship status: Married  . Intimate partner violence:    Fear of current or ex partner: No    Emotionally abused: No    Physically abused: No    Forced sexual activity: No  Other Topics Concern  . Not on file  Social History Narrative  . Not on file     Current Outpatient Medications:  .  Ascorbic Acid (VITAMIN C) 1000 MG tablet, Take 1,000 mg by mouth daily., Disp: , Rfl:  .  atorvastatin (LIPITOR) 40 MG tablet, Take 1 tablet (40 mg total) by mouth daily., Disp: 90 tablet, Rfl: 0 .  Cholecalciferol (VITAMIN D) 2000 units CAPS, Take 1 capsule (2,000 Units total) by mouth daily., Disp: 30 capsule, Rfl: 0 .  cyanocobalamin (,VITAMIN B-12,) 1000 MCG/ML injection, Inject 1,000 mcg into the skin every 30 (thirty) days. , Disp: , Rfl: 5 .  DULoxetine (CYMBALTA) 60 MG capsule, Take 1 capsule (60 mg total) by mouth daily., Disp: 90 capsule, Rfl: 0 .  gabapentin (NEURONTIN) 100 MG capsule, Take 1 capsule (100 mg total) by mouth 2 (two) times daily., Disp: 180 capsule, Rfl: 0 .  Multiple Vitamins-Minerals (MULTI-VITAMIN GUMMIES) CHEW, Chew 2 capsules by mouth daily., Disp: 30 tablet, Rfl: 0 .   omeprazole (PRILOSEC) 40 MG capsule, TAKE 1 CAPSULE BY MOUTH EVERY DAY, Disp: 90 capsule, Rfl: 1 .  QUEtiapine (SEROQUEL) 100 MG tablet, Take 1.5 tablets (150 mg total) by mouth at bedtime., Disp: 135 tablet, Rfl: 0 .  TRULICITY 1.5 BV/6.9IH SOPN, Inject 1.5 mg into the skin once a week., Disp: 4 pen, Rfl: 5 .  cyclobenzaprine (FLEXERIL) 5 MG tablet, TAKE 1 TABLET BY MOUTH AT BEDTIME AS NEEDED FOR MUSCLE SPASM, Disp: 90 tablet, Rfl: 0 .  levothyroxine (SYNTHROID) 25 MCG tablet, Take it daily, Disp: 90 tablet, Rfl: 0  Allergies  Allergen Reactions  . Vicodin [Hydrocodone-Acetaminophen] Other (See Comments)    Has really bad headaches/ migraine      I personally reviewed active problem list, medication list, allergies, family history, social history with the patient/caregiver today.   ROS  Ten systems reviewed and is negative except as mentioned in HPI   Objective  Virtual encounter, vitals not obtained.  Body mass index is 25.79 kg/m.  Physical Exam  Awake, alert and oriented Normal mood and affect, well groomed and normal speech pattern   PHQ2/9: Depression screen [facility] 2/9 10/30/2018 07/31/2018 12/16/2017 07/25/2017 04/24/2017  Decreased Interest 0 1 0 0 0  Down, Depressed, Hopeless 0 1 0 0 0  PHQ - 2 Score 0 2 0 0 0  Altered sleeping 0 1 0 - -  Tired, decreased energy 1 1 0 - -  Change in appetite 0 3 0 - -  Feeling bad or failure about yourself  0 1 0 - -  Trouble concentrating 0 1 0 - -  Moving slowly or fidgety/restless 0 0 0 - -  Suicidal thoughts 0 0 0 - -  PHQ-9 Score 1 9 0 - -  Difficult doing work/chores Not difficult at all Not difficult at all - - -   PHQ-2/9 Result is negative.    Fall Risk: Fall Risk  10/30/2018 07/31/2018 12/16/2017 07/25/2017 04/24/2017  Falls in the past year? 0 0 No No No  Number falls in past yr: 0 - - - -  Injury with Fall? 0 - - - -     Assessment & Plan  1. Recurrent  low back pain  - cyclobenzaprine (FLEXERIL) 5 MG tablet; TAKE 1  TABLET BY MOUTH AT BEDTIME AS NEEDED FOR MUSCLE SPASM  Dispense: 90 tablet; Refill: 0  2. Other specified hypothyroidism  - levothyroxine (SYNTHROID) 25 MCG tablet; Take it daily  Dispense: 90 tablet; Refill: 0 - TSH  3. Atherosclerosis of abdominal aorta (HCC)  - Lipid panel - atorvastatin (LIPITOR) 40 MG tablet; Take 1 tablet (40 mg total) by mouth daily.  Dispense: 90 tablet; Refill: 0  4. Vitamin D deficiency   5. B12 deficiency   6. Hodgkin's disease in remission ([place])   7. History of bariatric surgery   8. Bipolar disorder, current episode mixed, moderate (HCC)  - COMPLETE METABOLIC PANEL WITH GFR  9. Prediabetes  - Hemoglobin A1c - Referral to Chronic Care Management Services  10. Long-term use of high-risk medication  - COMPLETE METABOLIC PANEL WITH GFR - Hemoglobin A1c  11. Bipolar 1 disorder, mixed, moderate (HCC)   COMPLETE METABOLIC PANEL WITH GFR - DULoxetine (CYMBALTA) 60 MG capsule; Take 1 capsule (60 mg total) by mouth daily.  Dispense: 90 capsule; Refill: 0 - gabapentin (NEURONTIN) 100 MG capsule; Take 1 capsule (100 mg total) by mouth 2 (two) times daily.  Dispense: 180 capsule; Refill: 0 - QUEtiapine (SEROQUEL) 100 MG tablet; Take 1.5 tablets (150 mg total) by mouth at bedtime.  Dispense: 135 tablet; Refill: 0  12. GAD (generalized anxiety disorder)  - DULoxetine (CYMBALTA) 60 MG capsule; Take 1 capsule (60 mg total) by mouth daily.  Dispense: 90 capsule; Refill: 0 - gabapentin (NEURONTIN) 100 MG capsule; Take 1 capsule (100 mg total) by mouth 2 (two) times daily.  Dispense: 180 capsule; Refill: 0 - QUEtiapine (SEROQUEL) 100 MG tablet; Take 1.5 tablets (150 mg total) by mouth at bedtime.  Dispense: 135 tablet; Refill: 0  I discussed the assessment and treatment plan with the patient. The patient was provided an opportunity to ask questions and all were answered. The patient agreed with the plan and demonstrated an understanding of the  instructions.  The patient was advised to call back or seek an in-person evaluation if the symptoms worsen or if the condition fails to improve as anticipated.  I provided 25 minutes of non-face-to-face time during this encounter.

## 2018-11-04 ENCOUNTER — Ambulatory Visit: Payer: Self-pay | Admitting: *Deleted

## 2018-11-04 ENCOUNTER — Encounter: Payer: Self-pay | Admitting: *Deleted

## 2018-11-04 DIAGNOSIS — E8881 Metabolic syndrome: Secondary | ICD-10-CM

## 2018-11-04 DIAGNOSIS — R7303 Prediabetes: Secondary | ICD-10-CM

## 2018-11-04 NOTE — Chronic Care Management (AMB) (Signed)
Marland Kitchen  Care Management     General Note  11/04/2018 Name: Jessica [name] MRN: [id number] DOB: September 19, 1965  Jessica [name] is a 53 y.o. year old female who is a primary care patient of Steele Sizer, MD. The CCM was consulted to assist the patient with medication affordability.  [name] was given information about Chronic Care Management services today including:  1. CCM service includes personalized support from designated clinical staff supervised by her physician, including individualized plan of care and coordination with other care providers 2. 24/7 contact phone numbers for assistance for urgent and routine care needs. 3. Service will only be billed when office clinical staff spend 20 minutes or more in a month to coordinate care. 4. Only one practitioner may furnish and bill the service in a calendar month. 5. The patient may stop CCM services at any time (effective at the end of the month) by phone call to the office staff. 6. The patient will be responsible for cost sharing (co-pay) of up to 20% of the service fee (after annual deductible is met).  Patient agreed to services and verbal consent obtained.   Review of patient status, including review of consultants reports, relevant laboratory and other test results, and collaboration with appropriate care team members and the patient's provider was performed as part of comprehensive patient evaluation and provision of chronic care management services.    SDOH (Social Determinants of Health) screening performed today. See Care Plan Entry related to challenges with: Financial Strain related to medication affordability.  Goals Addressed   None      Follow Up Plan: Appointment scheduled for pharmacist to call client by phone on: 11/13/18       Elliot Gurney, [place] Worker  [facility] Care Management [phone]

## 2018-11-04 NOTE — Progress Notes (Signed)
This encounter was created in error - please disregard.

## 2018-11-08 IMAGING — US US ABDOMEN COMPLETE
1 series · 14 of 25 positions shown · non-contrast
Comparison: CT 02/18/2017

CLINICAL DATA: Remote history of Hodgkin's lymphoma. Epigastric
pain with low iron. Previous gastric bypass.

EXAM:
ABDOMEN ULTRASOUND COMPLETE

[Series 1: us abdomen complete · 0.17mm/px · 14 of 111 slices shown]
[im 1/111]
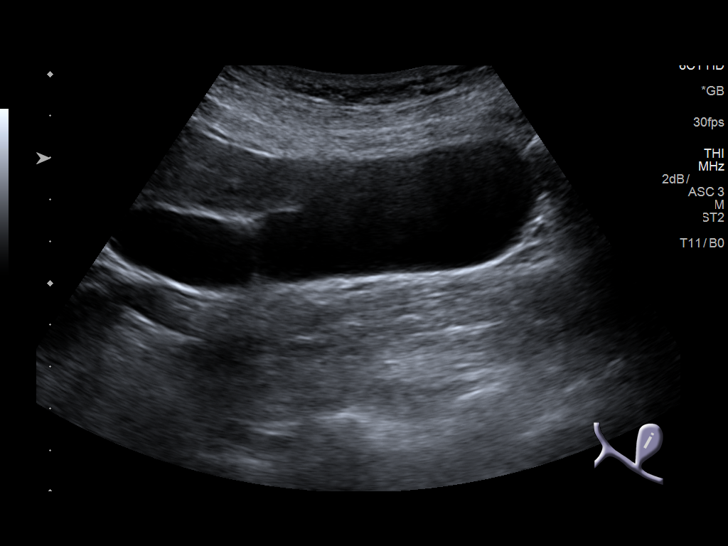
[im 10/111]
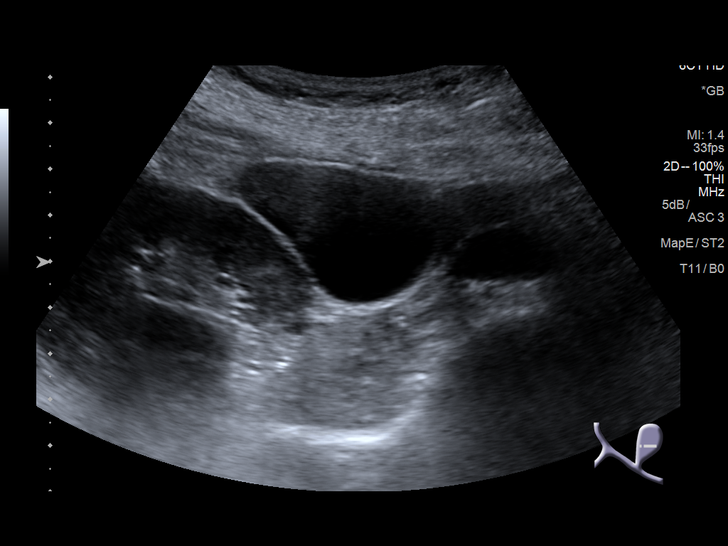
[im 19/111]
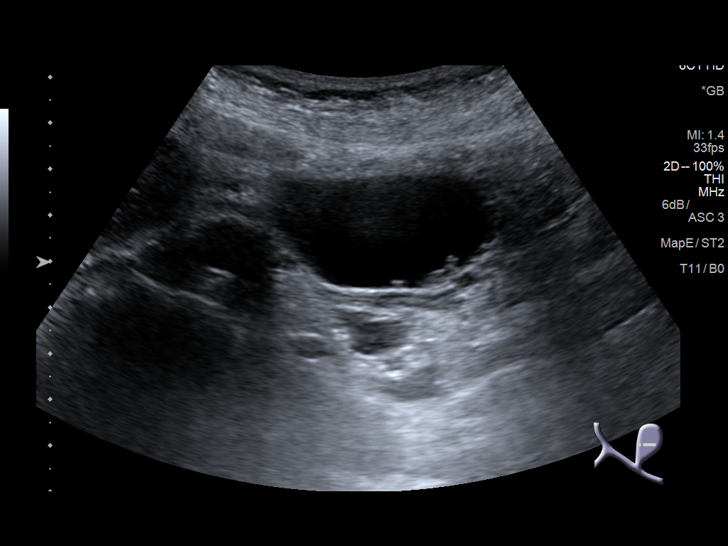
[im 28/111]
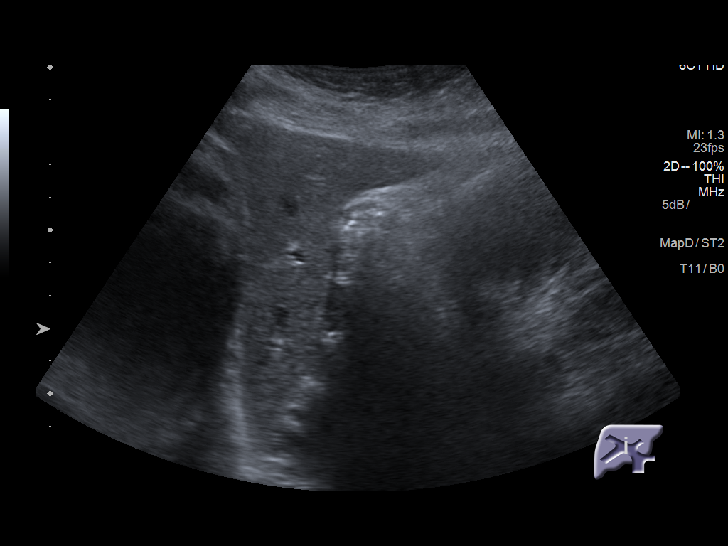
[im 37/111]
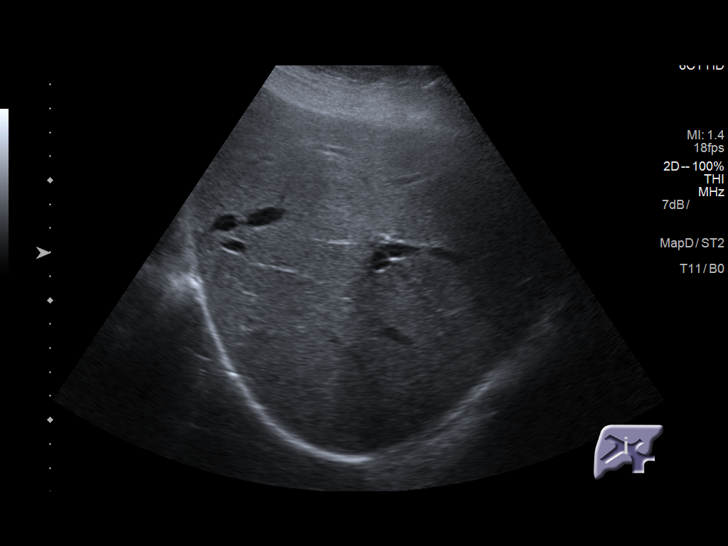
[im 42/111]
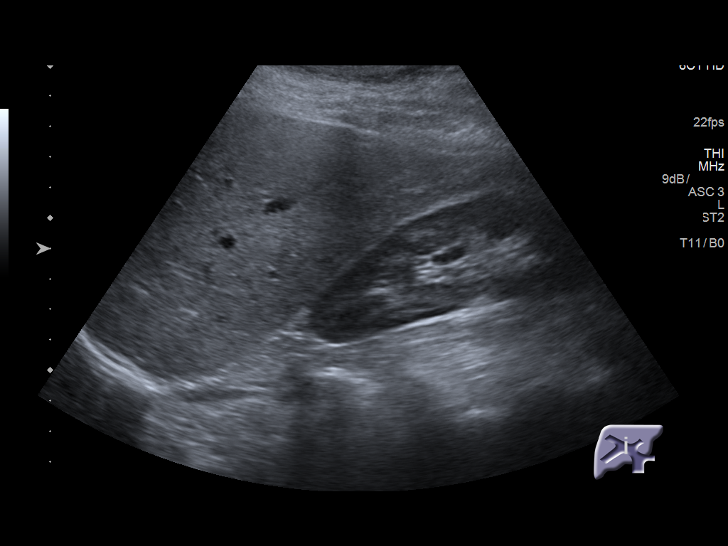
[im 51/111]
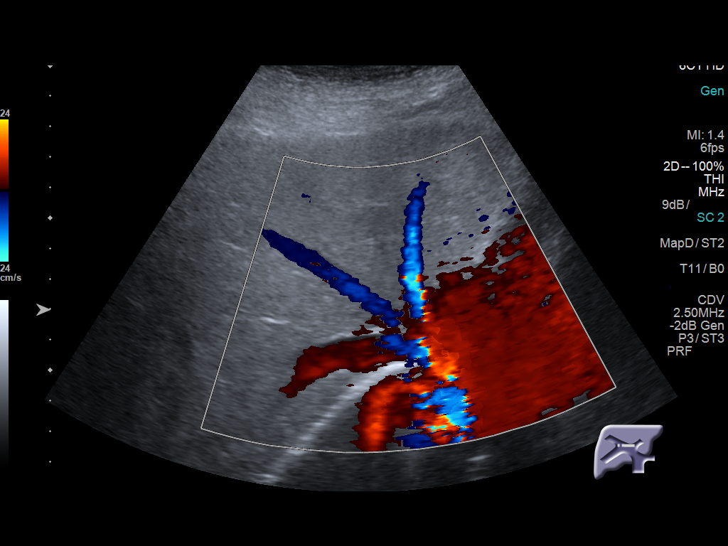
[im 60/111]
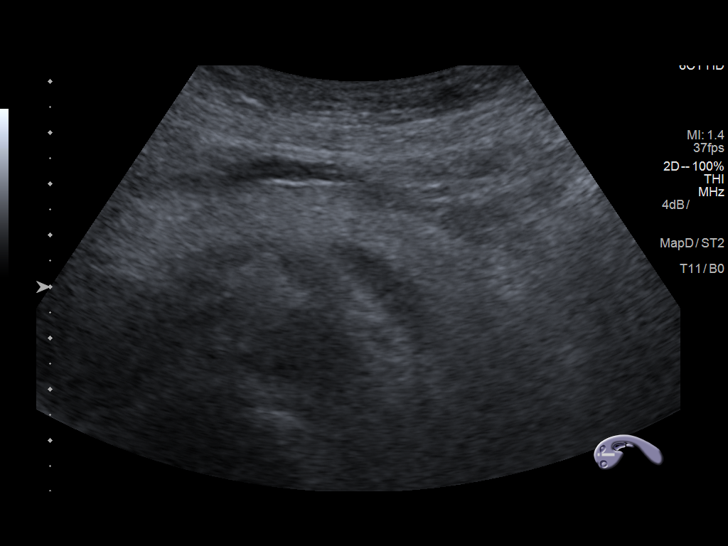
[im 69/111]
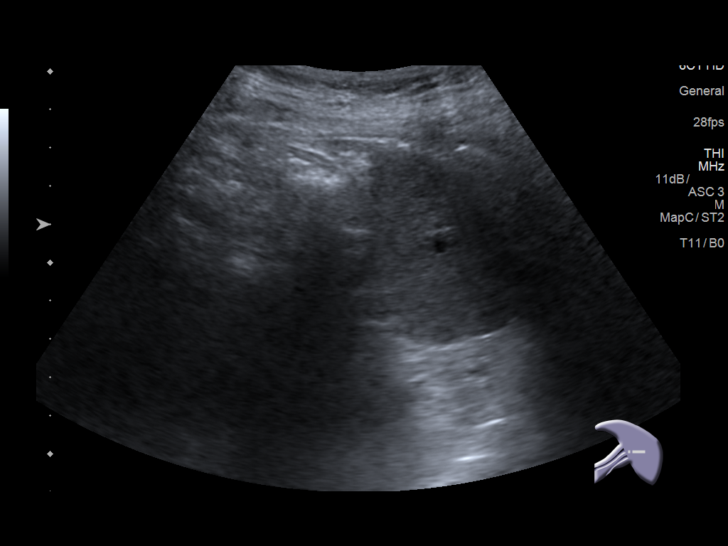
[im 74/111]
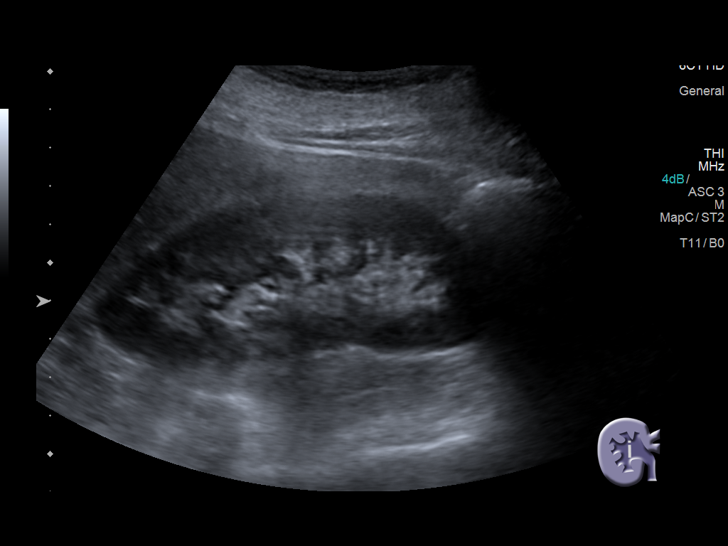
[im 83/111]
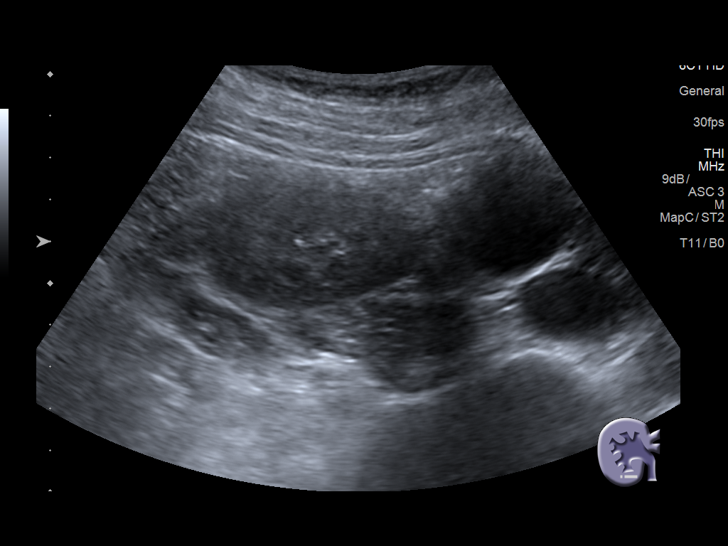
[im 92/111]
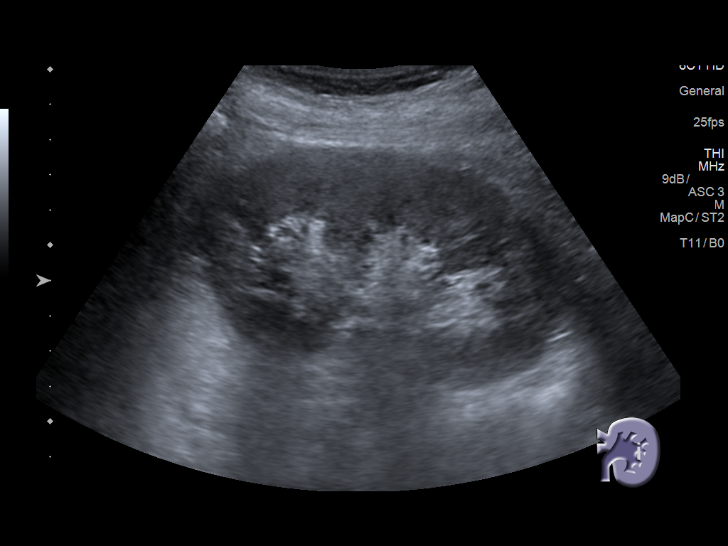
[im 101/111]
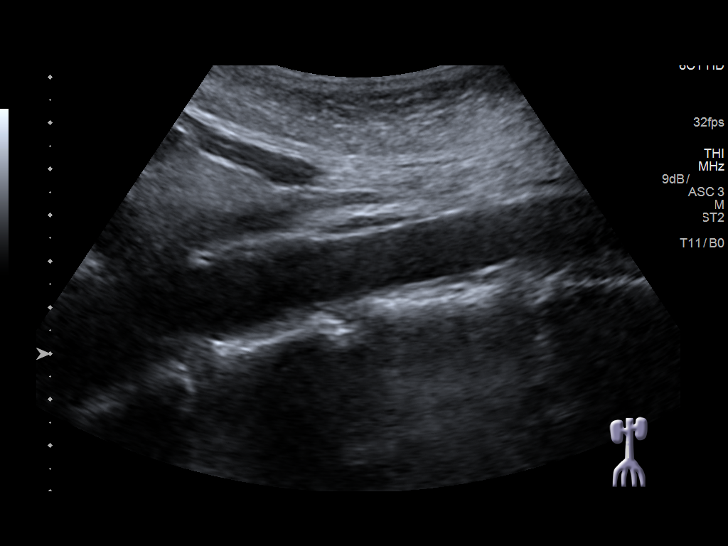
[im 111/111]
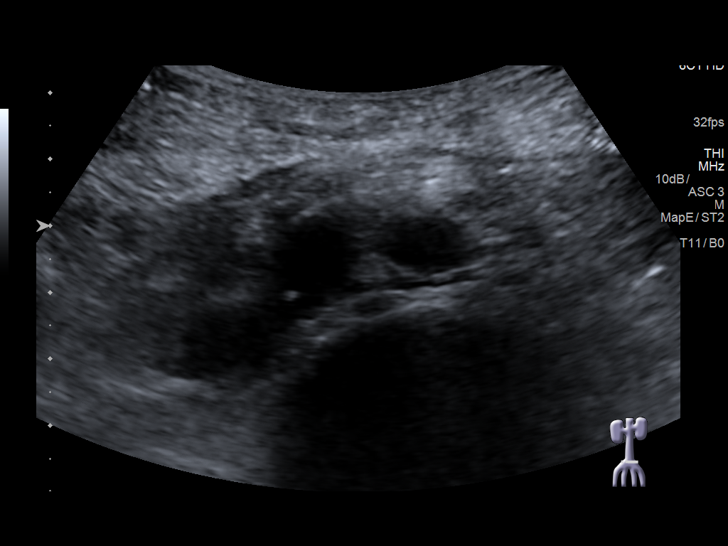

[14 of 25 positions shown; findings below may reference images not displayed]

FINDINGS: Gallbladder: Gallbladder demonstrates mild cholelithiasis with a few
small stones and possible mild sludge. No wall thickening or
pericholecystic fluid. Negative sonographic Murphy sign.

Common bile duct: Diameter: 4.1 mm.

Liver: No focal lesion identified. Within normal limits in
parenchymal echogenicity. Portal vein is patent on color Doppler
imaging with normal direction of blood flow towards the liver.

IVC: No abnormality visualized.

Pancreas: Visualized portion unremarkable.

Spleen: Size and appearance within normal limits.

Right Kidney: Length: 10.4 cm. Echogenicity within normal limits. No
mass or hydronephrosis visualized.

Left Kidney: Length: 10.3 cm. Echogenicity within normal limits. No
mass or hydronephrosis visualized.

Abdominal aorta: No aneurysm visualized. Mild atherosclerotic
disease.

Other findings: None.
IMPRESSION: No acute hepatobiliary findings.

Minimal cholelithiasis without additional sonographic evidence to
suggest cholecystitis.

## 2018-11-13 ENCOUNTER — Telehealth: Payer: Self-pay

## 2018-11-20 ENCOUNTER — Ambulatory Visit: Payer: Self-pay | Admitting: Pharmacist

## 2018-11-20 DIAGNOSIS — R7303 Prediabetes: Secondary | ICD-10-CM

## 2018-11-21 NOTE — Chronic Care Management (AMB) (Signed)
Care Management   Note  11/21/2018 Name: MALALA TRENKAMP MRN: [id number] DOB: 1966-01-17  Subjective:   Does the patient  feel that his/her medications are working for him/her?  yes  Has the patient been experiencing any side effects to the medications prescribed?  Yes- gabapentin (drowsy)  Does the patient have any problems obtaining medications due to transportation or finances?   yes  Understanding of regimen: good Understanding of indications: good Potential of compliance: fair/poor- takes Trulicity every 1.5 weeks to extend supply  Objective: Lab Results  Component Value Date   CREATININE 0.63 01/09/2018   CREATININE 0.61 02/18/2017   CREATININE 0.73 08/29/2016    Lab Results  Component Value Date   HGBA1C 5.3 08/08/2017    Lipid Panel     Component Value Date/Time   CHOL 108 08/08/2017 1042   CHOL 121 07/08/2013 1445   TRIG 82 08/08/2017 1042   TRIG 132 07/08/2013 1445   HDL 44 08/08/2017 1042   HDL 42 07/08/2013 1445   CHOLHDL 2.5 08/08/2017 1042   VLDL 16 08/08/2017 1042   VLDL 26 07/08/2013 1445   LDLCALC 48 08/08/2017 1042   LDLCALC 53 07/08/2013 1445    BP Readings from Last 3 Encounters:  07/31/18 120/80  03/10/18 136/84  03/05/18 122/75    Allergies  Allergen Reactions  . Vicodin [Hydrocodone-Acetaminophen] Other (See Comments)    Has really bad headaches/ migraine      Medications Reviewed Today    Reviewed by Cathi Roan, [facility] (Pharmacist) on 11/20/18 at 1127  Med List Status: <None>  Medication Order Taking? Sig Documenting Provider Last Dose Status Informant  Ascorbic Acid (VITAMIN C) 1000 MG tablet [id number] Yes Take 1,000 mg by mouth daily. [provider] Taking Active            Med Note Kary Kos, Blair Heys   Thu Nov 20, 2018 11:24 AM) For coronavirus  atorvastatin (LIPITOR) 40 MG tablet [id number] Yes Take 1 tablet (40 mg total) by mouth daily. Steele Sizer, MD Taking Active   Cholecalciferol (VITAMIN D) 2000 units  CAPS [id number] Yes Take 1 capsule (2,000 Units total) by mouth daily. Steele Sizer, MD Taking Active Self  cyclobenzaprine (FLEXERIL) 5 MG tablet [id number] Yes TAKE 1 TABLET BY MOUTH AT BEDTIME AS NEEDED FOR MUSCLE SPASM Steele Sizer, MD Taking Active   DULoxetine (CYMBALTA) 60 MG capsule [id number] Yes Take 1 capsule (60 mg total) by mouth daily. Steele Sizer, MD Taking Active   gabapentin (NEURONTIN) 100 MG capsule [id number] Yes Take 1 capsule (100 mg total) by mouth 2 (two) times daily. Steele Sizer, MD Taking Active            Med Note Clelia Croft Nov 20, 2018 11:25 AM) Just in the evening   levothyroxine (SYNTHROID) 25 MCG tablet [id number] Yes Take it daily Steele Sizer, MD Taking Active   Multiple Vitamins-Minerals (MULTI-VITAMIN GUMMIES) CHEW [id number] Yes Chew 2 capsules by mouth daily. Steele Sizer, MD Taking Active Self  omeprazole (PRILOSEC) 40 MG capsule [id number] Yes TAKE 1 CAPSULE BY MOUTH EVERY DAY Earlie Server, MD Taking Active            Med Note Kary Kos, Garnetta Buddy Nov 20, 2018 11:27 AM) As needed  QUEtiapine (SEROQUEL) 100 MG tablet [id number] Yes Take 1.5 tablets (150 mg total) by mouth at bedtime. Steele Sizer, MD Taking Active   TRULICITY 1.5 JS/2.8BT Bonney Aid [id number] Yes Inject 1.5 mg into the skin once  a week. Steele Sizer, MD Taking Active          Goals Addressed            This Visit's Progress   . Trulicity is very expensive for me (pt-stated)       Current Barriers:  . Financial Barriers . Uninsured  Pharmacist Clinical Goal(s):  Marland Kitchen Over the next 14 days, CCM pharmacist will collaborate with the patient and prescriber to get the patient set up with [facility] Medication Management clinic as evidenced by patient successfully getting prescriptions transferred and filled by Med Management pharmacy.   Interventions: . Provided patient Trulicity medication assistance application . Provided Dr. Ancil Boozer prescriber portion of Trulicity  application . Have Dr. Ancil Boozer transfer all precriptptions she is responsible for writing to Med Management clinic . Warm transfer of patient to Alm Bustard, case manager at Med Management clinic  Patient Self Care Activities:  . Complete [employer] paperwork and turn in to [facility] Medication Management clinic (formerly [facility])  Initial goal documentation        Plan: Recommendations discussed with provider - Please send all Rxs to Med Mgmt clinic ([facility])  Recommendations discussed with patient - Complete [employer] Cares (Trulicity) application and return to Med Mgmt Clinic  Follow up: Telephone follow up appointment with care management team member scheduled for: 14 days with PharmD   Ruben Reason, PharmD Clinical Pharmacist [facility] [phone]

## 2018-11-21 NOTE — Patient Instructions (Signed)
Thank you for meeting with me today! It was my pleasure to be a member of your care team.   1. Please look for an application in the mail for your Trulicity. Complete and return to the Medication Management clinic.   2. The Medication Management clinic will be contacting you so you can start using their pharmacy, please expect their call. They are located on [address], across from [facility].   Goals Addressed            This Visit's Progress   . Trulicity is very expensive for me (pt-stated)       Current Barriers:  . Financial Barriers . Uninsured  Pharmacist Clinical Goal(s):  Marland Kitchen Over the next 14 days, CCM pharmacist will collaborate with the patient and prescriber to get the patient set up with [facility] Medication Management clinic as evidenced by patient successfully getting prescriptions transferred and filled by Med Management pharmacy.   Interventions: . Provided patient Trulicity medication assistance application . Provided Dr. Ancil Boozer prescriber portion of Trulicity application . Have Dr. Ancil Boozer transfer all precriptptions she is responsible for writing to Med Management clinic . Warm transfer of patient to Alm Bustard, case manager at Med Management clinic  Patient Self Care Activities:  . Complete Lilly paperwork and turn in to [facility] Medication Management clinic (formerly [facility])  Initial goal documentation        Please call a member of the CCM (Chronic Care Management) Team with any questions or case management needs:   Vanetta Mulders, BSN Nurse Care Coordinator  [phone]  Ruben Reason, PharmD  Clinical Pharmacist  [phone]  [place], LCSW Clinical Social Worker [phone]   Thank you allowing the Chronic Care Management Team to be a part of your care!

## 2018-11-26 ENCOUNTER — Other Ambulatory Visit: Payer: Self-pay | Admitting: Family Medicine

## 2018-11-26 DIAGNOSIS — F3162 Bipolar disorder, current episode mixed, moderate: Secondary | ICD-10-CM

## 2018-11-26 DIAGNOSIS — E038 Other specified hypothyroidism: Secondary | ICD-10-CM

## 2018-11-26 DIAGNOSIS — F411 Generalized anxiety disorder: Secondary | ICD-10-CM

## 2018-11-26 DIAGNOSIS — M545 Low back pain, unspecified: Secondary | ICD-10-CM

## 2018-11-26 DIAGNOSIS — E8881 Metabolic syndrome: Secondary | ICD-10-CM

## 2018-11-26 DIAGNOSIS — R7303 Prediabetes: Secondary | ICD-10-CM

## 2018-11-26 MED ORDER — CYCLOBENZAPRINE HCL 5 MG PO TABS: ORAL_TABLET | ORAL | 0 refills | Status: DC

## 2018-11-26 MED ORDER — OMEPRAZOLE 40 MG PO CPDR: DELAYED_RELEASE_CAPSULE | ORAL | 1 refills | Status: DC

## 2018-11-26 MED ORDER — LEVOTHYROXINE SODIUM 25 MCG PO TABS: ORAL_TABLET | ORAL | 0 refills | Status: DC

## 2018-11-26 MED ORDER — TRULICITY 1.5 MG/0.5ML ~~LOC~~ SOAJ: 1.5000 mg | SUBCUTANEOUS | 1 refills | Status: DC

## 2018-11-26 MED ORDER — GABAPENTIN 100 MG PO CAPS: 100.0000 mg | ORAL_CAPSULE | Freq: Two times a day (BID) | ORAL | 0 refills | Status: DC

## 2018-11-26 MED ORDER — QUETIAPINE FUMARATE 100 MG PO TABS: 150.0000 mg | ORAL_TABLET | Freq: Every day | ORAL | 0 refills | Status: DC

## 2018-11-26 MED ORDER — DULOXETINE HCL 60 MG PO CPEP: 60.0000 mg | ORAL_CAPSULE | Freq: Every day | ORAL | 0 refills | Status: DC

## 2018-11-27 ENCOUNTER — Telehealth: Payer: Self-pay | Admitting: Pharmacy Technician

## 2018-11-27 NOTE — Telephone Encounter (Signed)
Provided patient with new patient packet to obtain Medication Management Clinic services.  [facility] must receive 2019 financial documentation in order to determine eligibility.  [place] [facility]

## 2018-12-03 ENCOUNTER — Ambulatory Visit: Payer: Self-pay | Admitting: Pharmacist

## 2018-12-03 NOTE — Patient Instructions (Signed)
Goals Addressed            This Visit's Progress   . COMPLETED: Trulicity is very expensive for me (pt-stated)       Current Barriers:  . Financial Barriers . Uninsured  Pharmacist Clinical Goal(s):  Marland Kitchen Over the next 14 days, CCM pharmacist will collaborate with the patient and prescriber to get the patient set up with [facility] Medication Management clinic as evidenced by patient successfully getting prescriptions transferred and filled by Med Management pharmacy.   Interventions: . Provided patient Trulicity medication assistance application . Provided Dr. Ancil Boozer prescriber portion of Trulicity application . Have Dr. Ancil Boozer transfer all precriptptions she is responsible for writing to Med Management clinic . Warm transfer of patient to Alm Bustard, case manager at Med Management clinic  Updated, 6/24: Patient has completed [employer] paperwork. Plans to drop off at Med [facility] clinic tomorrow 6/25. Dr. Ancil Boozer has sent in new rxs to Med Mgmt clinic.   Patient Self Care Activities:  . Complete Lilly paperwork and turn in to [facility] Medication Management clinic (formerly [facility])  Please see past updates related to this goal by clicking on the "Past Updates" button in the selected goal         Thank you for meeting with me today!  Please feel free to contact the care management team in the future should you have any needs.   Please call a member of the CCM (Chronic Care Management) Team with any questions or case management needs:   Vanetta Mulders, BSN Nurse Care Coordinator  [phone]  Ruben Reason, PharmD  Clinical Pharmacist  [phone]  Elliot Gurney, [place] Clinical Social Worker [phone]

## 2018-12-03 NOTE — Chronic Care Management (AMB) (Signed)
  Care Management   Follow Up Note   12/03/2018 Name: Jessica [name] MRN: [id number] DOB: 1965-11-05  Jessica [name] is a 53 y.o. year old female who is a primary care patient of Steele Sizer, MD. The clinical pharmacist was consulted to assist with medication costs- patient uninsured.  Assessment: Patient completed [employer] application provided 2 weeks ago. Plans to turn in to Med [facility] tomorrow. Has Med Mgmt and case manager Alm Bustard contact information.   Goals Addressed            This Visit's Progress   . COMPLETED: Trulicity is very expensive for me (pt-stated)       Current Barriers:  . Financial Barriers . Uninsured  Pharmacist Clinical Goal(s):  Marland Kitchen Over the next 14 days, CCM pharmacist will collaborate with the patient and prescriber to get the patient set up with [facility] Medication Management clinic as evidenced by patient successfully getting prescriptions transferred and filled by Med Management pharmacy.   Interventions: . Provided patient Trulicity medication assistance application . Provided Dr. Ancil Boozer prescriber portion of Trulicity application . Have Dr. Ancil Boozer transfer all precriptptions she is responsible for writing to Med Management clinic . Warm transfer of patient to Alm Bustard, case manager at Med Management clinic  Updated, 6/24: Patient has completed [employer] paperwork. Plans to drop off at Med [facility] clinic tomorrow 6/25. Dr. Ancil Boozer has sent in new rxs to Med Mgmt clinic.   Patient Self Care Activities:  . Complete Lilly paperwork and turn in to [facility] Medication Management clinic (formerly [facility])  Please see past updates related to this goal by clicking on the "Past Updates" button in the selected goal          No further follow up required: patient has met care goals and has engaged with [place], PharmD Clinical Pharmacist [place] [phone]

## 2018-12-04 ENCOUNTER — Telehealth: Payer: Self-pay | Admitting: Pharmacy Technician

## 2018-12-04 NOTE — Telephone Encounter (Signed)
Attempted to call patient to inquire about receiving [facility]'s application.  Unable to reach.  Left message.  [place] Medication [facility] Clinic

## 2018-12-15 ENCOUNTER — Other Ambulatory Visit: Payer: Self-pay

## 2018-12-15 ENCOUNTER — Ambulatory Visit: Payer: Self-pay | Admitting: Pharmacy Technician

## 2018-12-15 DIAGNOSIS — Z79899 Other long term (current) drug therapy: Secondary | ICD-10-CM

## 2018-12-15 NOTE — Progress Notes (Signed)
Patient verbally agreed to all terms of the Medication Management Clinic contract.   Patient to provide last 30 days of pay stubs from spouse and 2019 tax return.    Laurance Flatten, PharmD started PAP application for Trulicity.     Upon receipt of signed application from Dr. Ancil Boozer and additional poi from patient, Trulicity Prescription Application will be submitted to [place].  [place] Medication Management Clinic

## 2019-01-06 ENCOUNTER — Ambulatory Visit: Payer: Self-pay | Admitting: Pharmacist

## 2019-01-06 DIAGNOSIS — R7303 Prediabetes: Secondary | ICD-10-CM

## 2019-01-06 NOTE — Chronic Care Management (AMB) (Signed)
  Care Management   Follow Up Note   01/06/2019 Name: Jessica [name] MRN: [id number] DOB: 04-Dec-1965  Jessica [name] is a 53 y.o. year old female who is a primary care patient of Steele Sizer, MD. The clinical pharmacist was consulted to assist with medication costs- patient uninsured.  Assessment: Patient unfortunately did not qualify for Med Mgmt Clinic but does meet criteria for [employer] for Trulicity assistance. Faxed application and uploaded under media tab.   Goals Addressed            This Visit's Progress   . Trulicity is very expensive for me (pt-stated)       Current Barriers:  . Financial Barriers . Uninsured  Pharmacist Clinical Goal(s):  Marland Kitchen Over the next 14 days, CCM pharmacist will collaborate with the patient and prescriber to get the patient set up with [facility] Medication Management clinic as evidenced by patient successfully getting prescriptions transferred and filled by Med Management pharmacy.   Interventions: . Provided patient Trulicity medication assistance application . Provided Dr. Ancil Boozer prescriber portion of Trulicity application . Have Dr. Ancil Boozer transfer all precriptptions she is responsible for writing to Med Management clinic . Warm transfer of patient to Alm Bustard, case manager at Med Management clinic  Updated, 6/24: Patient has completed [employer] paperwork. Plans to drop off at Med [facility] clinic tomorrow 6/25. Dr. Ancil Boozer has sent in new rxs to Med Mgmt clinic.   Updated 7/28: Patient unfortunately is over household income requirement for [facility]. She still meets income requirements for [employer] to get her Trulicity. Received application from Med Mgmt via interoffice mail and faces application to [employer]. Uploaded under media tab.  Patient Self Care Activities:  . Complete Lilly paperwork and turn in to [facility] Medication Management clinic (formerly [facility])  Please see past updates related to this goal by clicking on the  "Past Updates" button in the selected goal          Telephone follow up appointment with care management team member scheduled for: within 5 days  Ruben Reason, PharmD Clinical Pharmacist [place] [phone]

## 2019-01-09 ENCOUNTER — Ambulatory Visit: Payer: Self-pay | Admitting: Pharmacist

## 2019-01-09 DIAGNOSIS — R7303 Prediabetes: Secondary | ICD-10-CM

## 2019-01-09 NOTE — Chronic Care Management (AMB) (Signed)
  Care Management   Follow Up Note   01/09/2019 Name: Jessica [name] MRN: [id number] DOB: June 04, 1966  Jessica [name] is a 53 y.o. year old female who is a primary care patient of Steele Sizer, MD. The clinical pharmacist was consulted to assist with medication costs- patient uninsured.  Follow up with patient via telephone today. HIPAA identifiers verified.   Assessment: Patient unfortunately did not qualify for Med Mgmt Clinic but does meet criteria for [employer] for Trulicity assistance. Faxed application and uploaded under media tab.   Patient is able to afford all other medications (90 DS) through [place] using good rx card.   Goals Addressed            This Visit's Progress   . Trulicity is very expensive for me (pt-stated)       Current Barriers:  . Financial Barriers . Uninsured  Pharmacist Clinical Goal(s):  Marland Kitchen Over the next 14 days, CCM pharmacist will collaborate with the patient and prescriber to get the patient set up with [facility] Medication Management clinic as evidenced by patient successfully getting prescriptions transferred and filled by Med Management pharmacy.   Interventions: . Provided patient Trulicity medication assistance application . Provided Dr. Ancil Boozer prescriber portion of Trulicity application . Have Dr. Ancil Boozer transfer all precriptptions she is responsible for writing to Med Management clinic . Warm transfer of patient to Alm Bustard, case manager at Med Management clinic  Updated 7/28: Patient unfortunately is over household income requirement for [facility]. She still meets income requirements for [employer] to get her Trulicity. Received application from Med Mgmt via interoffice mail and faces application to [employer]. Uploaded under media tab.  Patient Self Care Activities:  . Complete Lilly paperwork and turn in to [facility] Medication Management clinic (formerly [facility])  Please see past updates related to this  goal by clicking on the "Past Updates" button in the selected goal          Telephone follow up appointment with care management team member scheduled for: Tuesday, August 4 to double check [employer] and explain refill process.  Ruben Reason, PharmD Clinical Pharmacist [facility] [phone]

## 2019-01-13 ENCOUNTER — Ambulatory Visit: Payer: Self-pay | Admitting: Pharmacist

## 2019-01-13 DIAGNOSIS — R7303 Prediabetes: Secondary | ICD-10-CM

## 2019-01-14 NOTE — Chronic Care Management (AMB) (Signed)
  Chronic Care Management   Note  01/14/2019 Name: Jessica [name] MRN: [id number] DOB: 1966/01/20  Spoke with [employer]. Application still under review.   Goals Addressed            This Visit's Progress   . Trulicity is very expensive for me (pt-stated)       Current Barriers:  . Financial Barriers . Uninsured  Pharmacist Clinical Goal(s):  Marland Kitchen Over the next 14 days, CCM pharmacist will collaborate with the patient and prescriber to get the patient set up with [facility] Medication Management clinic as evidenced by patient successfully getting prescriptions transferred and filled by Med Management pharmacy.   Interventions: . Provided patient Trulicity medication assistance application . Provided Dr. Ancil Boozer prescriber portion of Trulicity application . Have Dr. Ancil Boozer transfer all precriptptions she is responsible for writing to Med Management clinic . Warm transfer of patient to Alm Bustard, case manager at Med Management clinic  Updated 7/28: Patient unfortunately is over household income requirement for [facility]. She still meets income requirements for [employer] to get her Trulicity. Received application from Med Mgmt via interoffice mail and faces application to [employer]. Uploaded under media tab.  Updated 8/4: Spoke with representative from [employer],   Patient Self Care Activities:  . Complete Lilly paperwork and turn in to [facility] Medication Management clinic (formerly [facility])  Please see past updates related to this goal by clicking on the "Past Updates" button in the selected goal          Follow up plan: Telephone follow up appointment with care management team member scheduled for: Friday August 7  Ruben Reason, PharmD Clinical Pharmacist [facility] [phone]

## 2019-01-16 ENCOUNTER — Ambulatory Visit: Payer: Self-pay | Admitting: Pharmacist

## 2019-01-16 DIAGNOSIS — R7303 Prediabetes: Secondary | ICD-10-CM

## 2019-01-16 NOTE — Chronic Care Management (AMB) (Signed)
  Care Management   Note  01/16/2019 Name: Jessica [name] MRN: [id number] DOB: [birth year]  Medication Assistance: Patient is has been approved for Trulicity made by [employer] and prescribed by Dr. Ancil Boozer. Approved 01/14/19 and expires 01/13/2020.   Follow up: Patient should expect call from [place] to schedule delivery of Trulicity to home. Will follow up with patient in 1-3  business days to confirm patient has received scheduling delivery telephone call and to explain any applicable refill process.    Telephone follow up appointment with care management team member scheduled for: 1-3 business days  Ruben Reason, PharmD Clinical Pharmacist [facility] [employer] [phone]

## 2019-01-20 ENCOUNTER — Ambulatory Visit: Payer: Self-pay | Admitting: Pharmacist

## 2019-01-20 DIAGNOSIS — R7303 Prediabetes: Secondary | ICD-10-CM

## 2019-01-20 NOTE — Chronic Care Management (AMB) (Signed)
  Care Management   Note  01/20/2019 Name: Jessica [name] MRN: [id number] DOB: 1965/08/20  Medication Assistance: Patient is has been approved for Trulicity made by [employer] and prescribed by Dr. Ancil Boozer. Approved 01/14/19 and expires 01/13/20.   Patient should receive medications via mail in 7/10 business days following call from [employer]. Provided patient with [facility] and recommendation to call at 8am due to extremely long hold times. Will follow up with patient in 7 business days to confirm patient has received medications and to explain any applicable refill process.   Goals Addressed            This Visit's Progress   . COMPLETED: Trulicity is very expensive for me (pt-stated)       Current Barriers:  . Financial Barriers . Uninsured  Pharmacist Clinical Goal(s):  Marland Kitchen Over the next 14 days, CCM pharmacist will collaborate with the patient and prescriber to get the patient set up with [facility] Medication Management clinic as evidenced by patient successfully getting prescriptions transferred and filled by Med [facility] pharmacy.   Interventions: . Provided patient Trulicity medication assistance application . Provided Dr. Ancil Boozer prescriber portion of Trulicity application . Have Dr. Ancil Boozer transfer all precriptptions she is responsible for writing to Med Management clinic . Warm transfer of patient to Alm Bustard, case manager at Med Management clinic  Updated 7/28: Patient unfortunately is over household income requirement for [facility]. She still meets income requirements for [employer] to get her Trulicity. Received application from [facility] via interoffice mail and faces application to [employer]. Uploaded under media tab.  Updated 8/4: Spoke with representative from [employer],   Patient Self Care Activities:  . Complete [employer] paperwork and turn in to [facility] Medication Management clinic (formerly [place])  Please see past updates related to this goal by clicking  on the "Past Updates" button in the selected goal          Follow up plan: Telephone follow up appointment with care management team member scheduled for: within 5 business days  Ruben Reason, PharmD Clinical Pharmacist [place] [phone]

## 2019-02-02 ENCOUNTER — Ambulatory Visit (INDEPENDENT_AMBULATORY_CARE_PROVIDER_SITE_OTHER): Payer: Self-pay | Admitting: Family Medicine

## 2019-02-02 ENCOUNTER — Other Ambulatory Visit: Payer: Self-pay

## 2019-02-02 ENCOUNTER — Encounter: Payer: Self-pay | Admitting: Family Medicine

## 2019-02-02 VITALS — Wt 158.0 lb

## 2019-02-02 DIAGNOSIS — I700 Atherosclerosis of aorta: Secondary | ICD-10-CM

## 2019-02-02 DIAGNOSIS — F411 Generalized anxiety disorder: Secondary | ICD-10-CM

## 2019-02-02 DIAGNOSIS — C8190 Hodgkin lymphoma, unspecified, unspecified site: Secondary | ICD-10-CM

## 2019-02-02 DIAGNOSIS — Z9884 Bariatric surgery status: Secondary | ICD-10-CM

## 2019-02-02 DIAGNOSIS — Z13220 Encounter for screening for lipoid disorders: Secondary | ICD-10-CM

## 2019-02-02 DIAGNOSIS — Z79899 Other long term (current) drug therapy: Secondary | ICD-10-CM

## 2019-02-02 DIAGNOSIS — E538 Deficiency of other specified B group vitamins: Secondary | ICD-10-CM

## 2019-02-02 DIAGNOSIS — R7303 Prediabetes: Secondary | ICD-10-CM

## 2019-02-02 DIAGNOSIS — E8881 Metabolic syndrome: Secondary | ICD-10-CM

## 2019-02-02 DIAGNOSIS — F3162 Bipolar disorder, current episode mixed, moderate: Secondary | ICD-10-CM

## 2019-02-02 DIAGNOSIS — E559 Vitamin D deficiency, unspecified: Secondary | ICD-10-CM

## 2019-02-02 DIAGNOSIS — E038 Other specified hypothyroidism: Secondary | ICD-10-CM

## 2019-02-02 NOTE — Progress Notes (Signed)
Name: Jessica [name]   MRN: [id number]    DOB: 02/23/1966   Date:02/02/2019       Progress Note  Subjective  Chief Complaint  Chief Complaint  Patient presents with  . Follow-up    3 month follow up  . Medication Refill    I connected with  [name]  on 02/02/19 at 10:20 AM EDT by a video enabled telemedicine application and verified that I am speaking with the correct person using two identifiers.  I discussed the limitations of evaluation and management by telemedicine and the availability of in person appointments. The patient expressed understanding and agreed to proceed. Staff also discussed with the patient that there may be a patient responsible charge related to this service. Patient Location: at home  Provider Location: [facility]   HPI  History of obesity: she had bariatric surgery 03/2011, maximum weight prior to surgery was 270 lbs, she went down to 142 lbs and was stable for many years, however since she was diagnosed with bipolarin 2017and was placedon antipsychotic medicationsandgained weight, March 2018 weight was 183 Lbs. She was onLevothyroxine and Metformin andwent down to 165 lbs in May 2018 and we started her on Trulicity to help curb appetite and help with pre-diabetes/insulin resistanceand shewasbelow her goal weight ( it was 140 lbs), down to 129 lbs, she was having nausea and epigastric pain, she finally saw Dr. Tasia Catchings, came off Nsaid's and was given carafate, omeprazole for treatment was ulcer and given iron infusion. She is now back on Trulicity , approved through [employer]. She states she has been eating balanced meals and walking daily, her weight is stable now at 155 lbs, today is 158 lbs   Pre-diabetes:hgbA1C was back in 2013 was 6.3%, but down to 5.3 % with weight loss , she was gradually gaining weight after bariatric surgery( 2012 )and we started her on Metformin 08/2016 and also Synthroid forelevatedTSH . She  makes better meal choices and feels like the medication is working well for her.Having protein shakes daily, eating healthy. She has been off Metformin since end of 2018 to switch to Trulicity. She was doing well, her weight went down to 129 lbs (but she also had some epigastric pain - later diagnosed with ulcer) , she denies any abdominal pain at this time. Weight is back to 158 lbs  She would like to continue Trulicity , getting it through [employer] and doing well  B12 deficiency:not currently getting B12 injections but is getting SL B12 at home daily , no fatigue or numbness or tingling   HighTSH: associated with fatigue, and weight gain, started on Levothyroxine 08/2016 and is tolerating well, still tired,last TSH was at goal, I explained that I cannot fill rx without a new lab results, it has been over one year.  Hodgkin's lymphoma:she went back to see Dr. Tasia Catchings last year, found to be very anemic, last iron studies was back to normal, advised to recheck but she wants to hold off on that , she does not have insurance at this time  Bipolar: no longer rseeing Dr.Eappen because of cost ,last visit was 08/2018, she will have insurance in January through her husband and will go back to Dr. Shea Evans, she is not sleeping well, waking up at 3 -4 am and unable to fall back asleep, discussed sleep hygiene and ways to push the waking time to around 5 am  She states her mood has been stable, not stressed about not working.  Atherosclerosis of aorta: not on statin therapy, advised her to recheck labs    Patient Active Problem List   Diagnosis Date Noted  . Iron deficiency anemia 01/09/2018  . Cervical high risk HPV (human papillomavirus) test positive 04/08/2017  . Abdominal pain, epigastric   . Bariatric surgery status   . Ulcer jejunum   . Adenoma of left adrenal gland 02/18/2017  . Abnormal TSH 08/30/2016  . Bipolar disorder ([place]) 12/02/2015  . HAV (hallux abducto valgus) 11/22/2015  . Bunion of  great toe 11/08/2015  . Attention-deficit hyperactivity disorder, other type 10/28/2015  . Sciatica 04/22/2015  . B12 deficiency 12/01/2014  . History of bariatric surgery 12/01/2014  . Anxiety, generalized 12/01/2014  . Gastro-esophageal reflux disease without esophagitis 12/01/2014  . H/O elevated lipids 12/01/2014  . H/O: HTN (hypertension) 12/01/2014  . H/O: obesity 12/01/2014  . Hodgkin's disease in remission ([place]) 12/01/2014  . Current tobacco use 12/01/2014  . Insomnia 03/24/2008  . Abnormal serum level of alkaline phosphatase 11/08/2006  . Chronic recurrent major depressive disorder ([place]) 11/08/2006    Past Surgical History:  Procedure Laterality Date  . ABDOMINAL HYSTERECTOMY    . BUNIONECTOMY Right   . CARPAL TUNNEL RELEASE    . COLONOSCOPY     multiple  . ESOPHAGOGASTRODUODENOSCOPY (EGD) WITH PROPOFOL N/A 02/21/2017   Procedure: ESOPHAGOGASTRODUODENOSCOPY (EGD) WITH PROPOFOL;  Surgeon: Lucilla Lame, MD;  Location: [facility];  Service: Gastroenterology;  Laterality: N/A;  Pre-diabetic  . excision of neck mass  09/1998  . GASTRIC BYPASS  03/12/2011  . LAPAROSCOPIC SUPRACERVICAL HYSTERECTOMY  2008   and unilateral salpingoophorectomy/ endometriosis-Dr Kincius  . TONSILLECTOMY      Family History  Problem Relation Age of Onset  . Cancer Mother 92       colon  . Epilepsy Mother   . Multiple myeloma Mother   . Glaucoma Father   . Breast cancer Maternal Grandmother 83       inflamitory breast ca    Social History   Socioeconomic History  . Marital status: Married    Spouse name: allen  . Number of children: 2  . Years of education: Not on file  . Highest education level: Associate degree: occupational, Hotel manager, or vocational program  Occupational History  . Not on file  Social Needs  . Financial resource strain: Somewhat hard  . Food insecurity    Worry: Never true    Inability: Never true  . Transportation needs    Medical: No     Non-medical: No  Tobacco Use  . Smoking status: Current Every Day Smoker    Packs/day: 0.50    Years: 15.00    Pack years: 7.50    Types: Cigarettes    Start date: 06/11/2000  . Smokeless tobacco: Never Used  Substance and Sexual Activity  . Alcohol use: No    Alcohol/week: 0.0 standard drinks    Comment: rarely  . Drug use: No  . Sexual activity: Yes    Partners: Male    Birth control/protection: Surgical  Lifestyle  . Physical activity    Days per week: 7 days    Minutes per session: 40 min  . Stress: Only a little  Relationships  . Social Herbalist on phone: Three times a week    Gets together: Once a week    Attends religious service: More than 4 times per year    Active member of club or organization: No    Attends meetings  of clubs or organizations: Never    Relationship status: Married  . Intimate partner violence    Fear of current or ex partner: No    Emotionally abused: No    Physically abused: No    Forced sexual activity: No  Other Topics Concern  . Not on file  Social History Narrative  . Not on file     Current Outpatient Medications:  .  Ascorbic Acid (VITAMIN C) 1000 MG tablet, Take 1,000 mg by mouth daily., Disp: , Rfl:  .  atorvastatin (LIPITOR) 40 MG tablet, Take 1 tablet (40 mg total) by mouth daily., Disp: 90 tablet, Rfl: 0 .  Cholecalciferol (VITAMIN D) 2000 units CAPS, Take 1 capsule (2,000 Units total) by mouth daily., Disp: 30 capsule, Rfl: 0 .  cyclobenzaprine (FLEXERIL) 5 MG tablet, TAKE 1 TABLET BY MOUTH AT BEDTIME AS NEEDED FOR MUSCLE SPASM, Disp: 90 tablet, Rfl: 0 .  DULoxetine (CYMBALTA) 60 MG capsule, Take 1 capsule (60 mg total) by mouth daily., Disp: 90 capsule, Rfl: 0 .  gabapentin (NEURONTIN) 100 MG capsule, Take 1 capsule (100 mg total) by mouth 2 (two) times daily., Disp: 180 capsule, Rfl: 0 .  levothyroxine (SYNTHROID) 25 MCG tablet, Take it daily, Disp: 90 tablet, Rfl: 0 .  Multiple Vitamins-Minerals (MULTI-VITAMIN  GUMMIES) CHEW, Chew 2 capsules by mouth daily., Disp: 30 tablet, Rfl: 0 .  omeprazole (PRILOSEC) 40 MG capsule, TAKE 1 CAPSULE BY MOUTH EVERY DAY, Disp: 90 capsule, Rfl: 1 .  QUEtiapine (SEROQUEL) 100 MG tablet, Take 1.5 tablets (150 mg total) by mouth at bedtime., Disp: 135 tablet, Rfl: 0 .  TRULICITY 1.5 CN/4.7SJ SOPN, Inject 1.5 mg into the skin once a week., Disp: 12 pen, Rfl: 1  Allergies  Allergen Reactions  . Vicodin [Hydrocodone-Acetaminophen] Other (See Comments)    Has really bad headaches/ migraine      I personally reviewed active problem list, medication list, allergies, family history, social history with the patient/caregiver today.   ROS  Ten systems reviewed and is negative except as mentioned in HPI   Objective  Virtual encounter, vitals not obtained.  There is no height or weight on file to calculate BMI.  Physical Exam  Awake, alert and oriented   PHQ2/9: Depression screen [facility] 2/9 02/02/2019 11/04/2018 10/30/2018 07/31/2018 12/16/2017  Decreased Interest 0 0 0 1 0  Down, Depressed, Hopeless 0 3 0 1 0  PHQ - 2 Score 0 3 0 2 0  Altered sleeping 3 - 0 1 0  Tired, decreased energy 1 - 1 1 0  Change in appetite 0 - 0 3 0  Feeling bad or failure about yourself  0 - 0 1 0  Trouble concentrating 0 - 0 1 0  Moving slowly or fidgety/restless 0 - 0 0 0  Suicidal thoughts 0 - 0 0 0  PHQ-9 Score 4 - 1 9 0  Difficult doing work/chores Not difficult at all - Not difficult at all Not difficult at all -  Some recent data might be hidden   PHQ-2/9 Result is negative.    Fall Risk: Fall Risk  02/02/2019 10/30/2018 07/31/2018 12/16/2017 07/25/2017  Falls in the past year? 0 0 0 No No  Number falls in past yr: 0 0 - - -  Injury with Fall? 0 0 - - -     Assessment & Plan  1. Bipolar 1 disorder, mixed, moderate (HCC)  Continue current regiment   2. Other specified hypothyroidism  TSH   3.  B12 deficiency  - CBC with Differential/Platelet  4. Insulin resistance  -  Hemoglobin A1c  5. Vitamin D deficiency  Continue supplementation  6. Atherosclerosis of abdominal aorta ([place])  On statin therapy  7. Hodgkin's disease in remission ([place])  Needs CBC and results will be sent to Dr. Tasia Catchings  8. History of bariatric surgery  Doing well at this time  9. Prediabetes  On Trulicity   10. GAD (generalized anxiety disorder)  Doing well at this time   11. Lipid screening  - Lipid panel  12. Long-term use of high-risk medication  - CBC with Differential/Platelet - COMPLETE METABOLIC PANEL WITH GFR  I discussed the assessment and treatment plan with the patient. The patient was provided an opportunity to ask questions and all were answered. The patient agreed with the plan and demonstrated an understanding of the instructions.  The patient was advised to call back or seek an in-person evaluation if the symptoms worsen or if the condition fails to improve as anticipated.  I provided 25 minutes of non-face-to-face time during this encounter.

## 2019-02-03 ENCOUNTER — Other Ambulatory Visit: Payer: Self-pay

## 2019-02-03 DIAGNOSIS — Z20822 Contact with and (suspected) exposure to covid-19: Secondary | ICD-10-CM

## 2019-02-04 LAB — CBC WITH DIFFERENTIAL/PLATELET
Absolute Monocytes: 423 cells/uL (ref 200–950)
Basophils Absolute: 37 cells/uL (ref 0–200)
Basophils Relative: 0.4 %
Eosinophils Absolute: 534 cells/uL — ABNORMAL HIGH (ref 15–500)
Eosinophils Relative: 5.8 %
HCT: 40.4 % (ref 35.0–45.0)
Hemoglobin: 13.2 g/dL (ref 11.7–15.5)
Lymphs Abs: 4094 cells/uL — ABNORMAL HIGH (ref 850–3900)
MCH: 30 pg (ref 27.0–33.0)
MCHC: 32.7 g/dL (ref 32.0–36.0)
MCV: 91.8 fL (ref 80.0–100.0)
MPV: 9.8 fL (ref 7.5–12.5)
Monocytes Relative: 4.6 %
Neutro Abs: 4112 cells/uL (ref 1500–7800)
Neutrophils Relative %: 44.7 %
Platelets: 308 10*3/uL (ref 140–400)
RBC: 4.4 10*6/uL (ref 3.80–5.10)
RDW: 12.6 % (ref 11.0–15.0)
Total Lymphocyte: 44.5 %
WBC: 9.2 10*3/uL (ref 3.8–10.8)

## 2019-02-04 LAB — COMPLETE METABOLIC PANEL WITH GFR
AG Ratio: 1.4 (calc) (ref 1.0–2.5)
ALT: 10 U/L (ref 6–29)
AST: 16 U/L (ref 10–35)
Albumin: 3.9 g/dL (ref 3.6–5.1)
Alkaline phosphatase (APISO): 206 U/L — ABNORMAL HIGH (ref 37–153)
BUN: 8 mg/dL (ref 7–25)
CO2: 27 mmol/L (ref 20–32)
Calcium: 9.2 mg/dL (ref 8.6–10.4)
Chloride: 106 mmol/L (ref 98–110)
Creat: 0.58 mg/dL (ref 0.50–1.05)
GFR, Est African American: 123 mL/min/{1.73_m2} (ref >=60)
GFR, Est Non African American: 106 mL/min/{1.73_m2} (ref >=60)
Globulin: 2.8 g/dL (calc) (ref 1.9–3.7)
Glucose, Bld: 82 mg/dL (ref 65–99)
Potassium: 4.3 mmol/L (ref 3.5–5.3)
Sodium: 140 mmol/L (ref 135–146)
Total Bilirubin: 0.4 mg/dL (ref 0.2–1.2)
Total Protein: 6.7 g/dL (ref 6.1–8.1)

## 2019-02-04 LAB — LIPID PANEL
Cholesterol: 108 mg/dL (ref <200)
HDL: 48 mg/dL — ABNORMAL LOW (ref >=50)
LDL Cholesterol (Calc): 42 mg/dL (calc)
Non-HDL Cholesterol (Calc): 60 mg/dL (calc) (ref <130)
Total CHOL/HDL Ratio: 2.3 (calc) (ref <5.0)
Triglycerides: 98 mg/dL (ref <150)

## 2019-02-04 LAB — HEMOGLOBIN A1C
Hgb A1c MFr Bld: 5.3 % of total Hgb (ref <5.7)
Mean Plasma Glucose: 105 (calc)
eAG (mmol/L): 5.8 (calc)

## 2019-02-04 LAB — TSH: TSH: 0.61 mIU/L

## 2019-02-04 LAB — NOVEL CORONAVIRUS, NAA: SARS-CoV-2, NAA: NOT DETECTED

## 2019-02-05 ENCOUNTER — Other Ambulatory Visit: Payer: Self-pay | Admitting: Family Medicine

## 2019-02-05 DIAGNOSIS — R748 Abnormal levels of other serum enzymes: Secondary | ICD-10-CM

## 2019-02-05 DIAGNOSIS — C8190 Hodgkin lymphoma, unspecified, unspecified site: Secondary | ICD-10-CM

## 2019-02-24 ENCOUNTER — Other Ambulatory Visit: Payer: Self-pay | Admitting: Family Medicine

## 2019-02-24 DIAGNOSIS — E038 Other specified hypothyroidism: Secondary | ICD-10-CM

## 2019-02-24 DIAGNOSIS — M545 Low back pain, unspecified: Secondary | ICD-10-CM

## 2019-02-24 DIAGNOSIS — F3162 Bipolar disorder, current episode mixed, moderate: Secondary | ICD-10-CM

## 2019-02-24 DIAGNOSIS — F411 Generalized anxiety disorder: Secondary | ICD-10-CM

## 2019-02-24 DIAGNOSIS — I700 Atherosclerosis of aorta: Secondary | ICD-10-CM

## 2019-02-24 MED ORDER — ATORVASTATIN CALCIUM 40 MG PO TABS: 40.0000 mg | ORAL_TABLET | Freq: Every day | ORAL | 0 refills | Status: DC

## 2019-02-24 MED ORDER — CYCLOBENZAPRINE HCL 5 MG PO TABS: ORAL_TABLET | ORAL | 0 refills | Status: DC

## 2019-02-24 MED ORDER — QUETIAPINE FUMARATE 100 MG PO TABS: 150.0000 mg | ORAL_TABLET | Freq: Every day | ORAL | 0 refills | Status: DC

## 2019-02-24 MED ORDER — DULOXETINE HCL 60 MG PO CPEP: 60.0000 mg | ORAL_CAPSULE | Freq: Every day | ORAL | 0 refills | Status: DC

## 2019-02-24 MED ORDER — GABAPENTIN 100 MG PO CAPS: 100.0000 mg | ORAL_CAPSULE | Freq: Two times a day (BID) | ORAL | 0 refills | Status: DC

## 2019-02-24 MED ORDER — LEVOTHYROXINE SODIUM 25 MCG PO TABS: ORAL_TABLET | ORAL | 0 refills | Status: DC

## 2019-02-24 NOTE — Telephone Encounter (Signed)
Requested medication (s) are due for refill today: yes  Requested medication (s) are on the active medication list:yes  Last refill:  10/30/2018  Future visit scheduled: yes  Notes to clinic:  Refill cannot be delegated    Requested Prescriptions  Pending Prescriptions Disp Refills   cyclobenzaprine (FLEXERIL) 5 MG tablet [Pharmacy Med Name: CYCLOBENZAPRINE 5 MG TABLET] 90 tablet 0    Sig: TAKE ONE TABLET BY MOUTH EVERY NIGHT AT BEDTIME AS NEEDED FOR MUSCLE SPASMS     Not Delegated - Analgesics:  Muscle Relaxants Failed - 02/24/2019  6:30 AM      Failed - This refill cannot be delegated      Passed - Valid encounter within last 6 months    Recent Outpatient Visits          3 weeks ago Bipolar 1 disorder, mixed, moderate ([place])   [facility] [place], Drue Stager, MD   3 months ago Atherosclerosis of abdominal aorta [facility])   [facility] Steele Sizer, MD   6 months ago Vitamin D deficiency   [facility] Steele Sizer, MD   1 year ago Prediabetes   [facility] Steele Sizer, MD   1 year ago Prediabetes   [facility] Steele Sizer, MD      Future Appointments            In 2 months Ancil Boozer, Drue Stager, MD [facility]            QUEtiapine (SEROQUEL) 100 MG tablet [Pharmacy Med Name: QUEtiapine FUMARATE 100 MG TAB] 135 tablet 0    Sig: TAKE 1.5 TABLETS BY MOUTH EVERY NIGHT AT BEDTIME     Not Delegated - Psychiatry:  Antipsychotics - Second Generation (Atypical) - quetiapine Failed - 02/24/2019  6:30 AM      Failed - This refill cannot be delegated      Passed - ALT in normal range and within 180 days    ALT  Date Value Ref Range Status  02/03/2019 10 6 - 29 U/L Final   SGPT (ALT)  Date Value Ref Range Status  03/03/2014 28 U/L Final    Comment:    14-63 NOTE: New Reference Range 12/29/13          Passed - AST in normal range and within 180  days    AST  Date Value Ref Range Status  02/03/2019 16 10 - 35 U/L Final   SGOT(AST)  Date Value Ref Range Status  03/03/2014 38 (H) 15 - 37 Unit/L Final         Passed - Last BP in normal range    BP Readings from Last 1 Encounters:  07/31/18 120/80         Passed - Valid encounter within last 6 months    Recent Outpatient Visits          3 weeks ago Bipolar 1 disorder, mixed, moderate ([place])   [facility] [place], Drue Stager, MD   3 months ago Atherosclerosis of abdominal aorta [facility])   [facility] Steele Sizer, MD   6 months ago Vitamin D deficiency   [facility] Steele Sizer, MD   1 year ago Prediabetes   [facility] Steele Sizer, MD   1 year ago Prediabetes   [facility] Steele Sizer, MD      Future Appointments  In 2 months Steele Sizer, MD [place] - Completed PHQ-2 or PHQ-9 in the last 360 days.      Signed Prescriptions Disp Refills   levothyroxine (SYNTHROID) 25 MCG tablet 90 tablet 0    Sig: TAKE ONE TABLET BY MOUTH DAILY     Endocrinology:  Hypothyroid Agents Failed - 02/24/2019  6:30 AM      Failed - TSH needs to be rechecked within 3 months after an abnormal result. Refill until TSH is due.      Passed - TSH in normal range and within 360 days    TSH  Date Value Ref Range Status  02/03/2019 0.61 mIU/L Final    Comment:              Reference Range .           > or = 20 Years  0.40-4.50 .                Pregnancy Ranges           First trimester    0.26-2.66           Second trimester   0.55-2.73           Third trimester    0.43-2.91          Passed - Valid encounter within last 12 months    Recent Outpatient Visits          3 weeks ago Bipolar 1 disorder, mixed, moderate ([place])   [facility] [place], Drue Stager, MD   3 months ago Atherosclerosis of abdominal  aorta [facility])   [facility] Steele Sizer, MD   6 months ago Vitamin D deficiency   [facility] Steele Sizer, MD   1 year ago Prediabetes   [facility] Steele Sizer, MD   1 year ago Prediabetes   [facility] Steele Sizer, MD      Future Appointments            In 2 months Steele Sizer, MD [facility], [facility]

## 2019-05-05 ENCOUNTER — Encounter: Payer: Self-pay | Admitting: Family Medicine

## 2019-05-05 ENCOUNTER — Ambulatory Visit (INDEPENDENT_AMBULATORY_CARE_PROVIDER_SITE_OTHER): Payer: Self-pay | Admitting: Family Medicine

## 2019-05-05 VITALS — Ht 65.5 in | Wt 155.0 lb

## 2019-05-05 DIAGNOSIS — E038 Other specified hypothyroidism: Secondary | ICD-10-CM

## 2019-05-05 DIAGNOSIS — M545 Low back pain, unspecified: Secondary | ICD-10-CM

## 2019-05-05 DIAGNOSIS — K219 Gastro-esophageal reflux disease without esophagitis: Secondary | ICD-10-CM

## 2019-05-05 DIAGNOSIS — I700 Atherosclerosis of aorta: Secondary | ICD-10-CM

## 2019-05-05 DIAGNOSIS — F3162 Bipolar disorder, current episode mixed, moderate: Secondary | ICD-10-CM

## 2019-05-05 DIAGNOSIS — F411 Generalized anxiety disorder: Secondary | ICD-10-CM

## 2019-05-05 MED ORDER — DULOXETINE HCL 60 MG PO CPEP: 60.0000 mg | ORAL_CAPSULE | Freq: Every day | ORAL | 1 refills | Status: DC

## 2019-05-05 MED ORDER — ATORVASTATIN CALCIUM 40 MG PO TABS: 40.0000 mg | ORAL_TABLET | Freq: Every day | ORAL | 1 refills | Status: DC

## 2019-05-05 MED ORDER — CYCLOBENZAPRINE HCL 5 MG PO TABS: ORAL_TABLET | ORAL | 1 refills | Status: DC

## 2019-05-05 MED ORDER — OMEPRAZOLE 40 MG PO CPDR: DELAYED_RELEASE_CAPSULE | ORAL | 1 refills | Status: DC

## 2019-05-05 MED ORDER — QUETIAPINE FUMARATE 100 MG PO TABS: 150.0000 mg | ORAL_TABLET | Freq: Every day | ORAL | 1 refills | Status: DC

## 2019-05-05 MED ORDER — GABAPENTIN 100 MG PO CAPS: 100.0000 mg | ORAL_CAPSULE | Freq: Two times a day (BID) | ORAL | 1 refills | Status: DC

## 2019-05-05 MED ORDER — LEVOTHYROXINE SODIUM 25 MCG PO TABS: ORAL_TABLET | ORAL | 1 refills | Status: DC

## 2019-05-05 NOTE — Progress Notes (Signed)
Name: Jessica [name]   MRN: [id number]    DOB: 1965/11/16   Date:05/05/2019       Progress Note  Subjective  Chief Complaint  Chief Complaint  Patient presents with  . Medication Refill  . History of obesity  . Pre-diabetes:hgbA1C  . High TSH  . Lymphoma  . Manic Behavior    I connected with  Jessica [name]  on 05/05/19 at 11:00 AM EST by a video enabled telemedicine application and verified that I am speaking with the correct person using two identifiers.  I discussed the limitations of evaluation and management by telemedicine and the availability of in person appointments. The patient expressed understanding and agreed to proceed. Staff also discussed with the patient that there may be a patient responsible charge related to this service. Patient Location: at home  Provider Location: [facility]   HPI  History of obesity: she had bariatric surgery 03/2011, maximum weight prior to surgery was 270 lbs, she went down to 142 lbs and was stable for many years, however since she was diagnosed with bipolarin 2017and was placedon antipsychotic medicationsandgained weight, March 2018 weight was 183 Lbs. She was onLevothyroxine and Metformin andwent down to 165 lbs in May 2018 and we started her on Trulicity to help curb appetite and help with pre-diabetes/insulin resistanceand shewasbelow her goal weight ( it was 140 lbs), down to 129 lbs, she was having nausea and epigastric pain, she finally saw Dr. Tasia Catchings, came off Nsaid's and was given carafate, omeprazolefor treatment was ulcerand given iron infusion.She is now back on Trulicity , approved through [employer]. She states she has been eating balanced meals and walking daily, her weight has been stable lately. Unchanged   Pre-diabetes:hgbA1C was back in 2013 was 6.3%, but down to 5.3 % with weight loss , she was gradually gaining weight after bariatric surgery( 2012 )and we started her on Metformin 08/2016  and also Synthroid forelevatedTSH . She makes better meal choices and feels like the medication is working well for her.Having protein shakes daily, eating healthy. She has been off Metformin since end of 2018 to switch to Trulicity. She was doing well, her weight went down to 129 lbs (but she also had some epigastric pain - later diagnosed with ulcer), she gained weight back but is back on Trulicity and weight has been stable  158 lbs to 155 lbs.  She would like to continue Trulicity , getting it through [employer] and doing well  B12 deficiency:not currently getting B12 injections but is getting SL B12 at home daily , no fatigue or numbnessor tingling Last levels at goal   Adult hypothyroidism: : associated with fatigue, and weight gain, started on Levothyroxine 08/2016 and is tolerating well, still tired,last TSH was at goal  Hodgkin's lymphoma:she went back to see Dr. Tasia Catchings last year, last CBC did not show anemia, white count was normal, except for some elevation of lymphocytes and eosinophils.   Bipolar: no longer rseeing Dr.Eappen because of cost ,last visit was 01/2019, she will have insurance in January through her husband and will go back to Dr. Shea Evans. Taking medication as prescribed. Denies side effects, no recent manic episodes. She states mood is much better since not working, anxiety starts again when she thinks about getting a job. She had 3 jobs in 6 months, she was told by [facility] that she had too much nervous energy.   Insomnia: since not working she has been napping during the day, going to  bed late and waking up during the night, feels tired during the day.   Atherosclerosis of aorta: not on statin therapy,reviewed labs  Patient Active Problem List   Diagnosis Date Noted  . Iron deficiency anemia 01/09/2018  . Cervical high risk HPV (human papillomavirus) test positive 04/08/2017  . Abdominal pain, epigastric   . Bariatric surgery status   . Ulcer jejunum   . Adenoma of  left adrenal gland 02/18/2017  . Abnormal TSH 08/30/2016  . Bipolar disorder ([place]) 12/02/2015  . HAV (hallux abducto valgus) 11/22/2015  . Bunion of great toe 11/08/2015  . Attention-deficit hyperactivity disorder, other type 10/28/2015  . Sciatica 04/22/2015  . B12 deficiency 12/01/2014  . History of bariatric surgery 12/01/2014  . Anxiety, generalized 12/01/2014  . Gastro-esophageal reflux disease without esophagitis 12/01/2014  . H/O elevated lipids 12/01/2014  . H/O: HTN (hypertension) 12/01/2014  . H/O: obesity 12/01/2014  . Hodgkin's disease in remission ([place]) 12/01/2014  . Current tobacco use 12/01/2014  . Insomnia 03/24/2008  . Abnormal serum level of alkaline phosphatase 11/08/2006  . Chronic recurrent major depressive disorder ([place]) 11/08/2006    Past Surgical History:  Procedure Laterality Date  . ABDOMINAL HYSTERECTOMY    . BUNIONECTOMY Right   . CARPAL TUNNEL RELEASE    . COLONOSCOPY     multiple  . ESOPHAGOGASTRODUODENOSCOPY (EGD) WITH PROPOFOL N/A 02/21/2017   Procedure: ESOPHAGOGASTRODUODENOSCOPY (EGD) WITH PROPOFOL;  Surgeon: Lucilla Lame, MD;  Location: [place];  Service: Gastroenterology;  Laterality: N/A;  Pre-diabetic  . excision of neck mass  09/1998  . GASTRIC BYPASS  03/12/2011  . LAPAROSCOPIC SUPRACERVICAL HYSTERECTOMY  2008   and unilateral salpingoophorectomy/ endometriosis-Dr Kincius  . TONSILLECTOMY      Family History  Problem Relation Age of Onset  . Cancer Mother 75       colon  . Epilepsy Mother   . Multiple myeloma Mother   . Glaucoma Father   . Breast cancer Maternal Grandmother 83       inflamitory breast ca    Social History   Socioeconomic History  . Marital status: Married    Spouse name: allen  . Number of children: 2  . Years of education: Not on file  . Highest education level: Associate degree: occupational, Hotel manager, or vocational program  Occupational History  . Not on file  Social Needs  . Financial  resource strain: Somewhat hard  . Food insecurity    Worry: Never true    Inability: Never true  . Transportation needs    Medical: No    Non-medical: No  Tobacco Use  . Smoking status: Current Every Day Smoker    Packs/day: 0.50    Years: 15.00    Pack years: 7.50    Types: Cigarettes    Start date: 06/11/2000  . Smokeless tobacco: Never Used  Substance and Sexual Activity  . Alcohol use: No    Alcohol/week: 0.0 standard drinks    Comment: rarely  . Drug use: No  . Sexual activity: Yes    Partners: Male    Birth control/protection: Surgical  Lifestyle  . Physical activity    Days per week: 7 days    Minutes per session: 40 min  . Stress: Only a little  Relationships  . Social Herbalist on phone: Three times a week    Gets together: Once a week    Attends religious service: More than 4 times per year    Active member of  club or organization: No    Attends meetings of clubs or organizations: Never    Relationship status: Married  . Intimate partner violence    Fear of current or ex partner: No    Emotionally abused: No    Physically abused: No    Forced sexual activity: No  Other Topics Concern  . Not on file  Social History Narrative  . Not on file     Current Outpatient Medications:  .  Ascorbic Acid (VITAMIN C) 1000 MG tablet, Take 1,000 mg by mouth daily., Disp: , Rfl:  .  atorvastatin (LIPITOR) 40 MG tablet, Take 1 tablet (40 mg total) by mouth daily., Disp: 90 tablet, Rfl: 0 .  Cholecalciferol (VITAMIN D) 2000 units CAPS, Take 1 capsule (2,000 Units total) by mouth daily., Disp: 30 capsule, Rfl: 0 .  cyclobenzaprine (FLEXERIL) 5 MG tablet, TAKE 1 TABLET BY MOUTH AT BEDTIME AS NEEDED FOR MUSCLE SPASM, Disp: 90 tablet, Rfl: 0 .  DULoxetine (CYMBALTA) 60 MG capsule, Take 1 capsule (60 mg total) by mouth daily., Disp: 90 capsule, Rfl: 0 .  gabapentin (NEURONTIN) 100 MG capsule, Take 1 capsule (100 mg total) by mouth 2 (two) times daily., Disp: 180  capsule, Rfl: 0 .  levothyroxine (SYNTHROID) 25 MCG tablet, Take it daily, Disp: 90 tablet, Rfl: 0 .  Multiple Vitamins-Minerals (MULTI-VITAMIN GUMMIES) CHEW, Chew 2 capsules by mouth daily., Disp: 30 tablet, Rfl: 0 .  omeprazole (PRILOSEC) 40 MG capsule, TAKE 1 CAPSULE BY MOUTH EVERY DAY, Disp: 90 capsule, Rfl: 1 .  QUEtiapine (SEROQUEL) 100 MG tablet, Take 1.5 tablets (150 mg total) by mouth at bedtime., Disp: 135 tablet, Rfl: 0 .  TRULICITY 1.5 BT/5.9RC SOPN, Inject 1.5 mg into the skin once a week., Disp: 12 pen, Rfl: 1  Allergies  Allergen Reactions  . Vicodin [Hydrocodone-Acetaminophen] Other (See Comments)    Has really bad headaches/ migraine      I personally reviewed active problem list, medication list, allergies, family history, social history, health maintenance with the patient/caregiver today.   ROS  Ten systems reviewed and is negative except as mentioned in HPI   Objective  Virtual encounte  Today's Vitals   05/05/19 0956  Weight: 155 lb (70.3 kg)  Height: 5' 5.5" (1.664 m)   Body mass index is 25.4 kg/m.  Physical Exam  Awake, alert and oriented   PHQ2/9: Depression screen [facility] 2/9 05/05/2019 02/02/2019 11/04/2018 10/30/2018 07/31/2018  Decreased Interest 1 0 0 0 1  Down, Depressed, Hopeless 0 0 3 0 1  PHQ - 2 Score 1 0 3 0 2  Altered sleeping 3 3 - 0 1  Tired, decreased energy 3 1 - 1 1  Change in appetite 0 0 - 0 3  Feeling bad or failure about yourself  0 0 - 0 1  Trouble concentrating 1 0 - 0 1  Moving slowly or fidgety/restless 1 0 - 0 0  Suicidal thoughts 0 0 - 0 0  PHQ-9 Score 9 4 - 1 9  Difficult doing work/chores Somewhat difficult Not difficult at all - Not difficult at all Not difficult at all  Some recent data might be hidden   PHQ-2/9 Result is positive.    Fall Risk: Fall Risk  05/05/2019 02/02/2019 10/30/2018 07/31/2018 12/16/2017  Falls in the past year? 0 0 0 0 No  Number falls in past yr: 0 0 0 - -  Injury with Fall? 0 0 0 - -      Assessment &  Plan  1. Bipolar 1 disorder, mixed, moderate (HCC)  - QUEtiapine (SEROQUEL) 100 MG tablet; Take 1.5 tablets (150 mg total) by mouth at bedtime.  Dispense: 135 tablet; Refill: 1 - DULoxetine (CYMBALTA) 60 MG capsule; Take 1 capsule (60 mg total) by mouth daily.  Dispense: 90 capsule; Refill: 1 - gabapentin (NEURONTIN) 100 MG capsule; Take 1 capsule (100 mg total) by mouth 2 (two) times daily.  Dispense: 180 capsule; Refill: 1  2. GAD (generalized anxiety disorder)  - QUEtiapine (SEROQUEL) 100 MG tablet; Take 1.5 tablets (150 mg total) by mouth at bedtime.  Dispense: 135 tablet; Refill: 1 - DULoxetine (CYMBALTA) 60 MG capsule; Take 1 capsule (60 mg total) by mouth daily.  Dispense: 90 capsule; Refill: 1 - gabapentin (NEURONTIN) 100 MG capsule; Take 1 capsule (100 mg total) by mouth 2 (two) times daily.  Dispense: 180 capsule; Refill: 1  3. Other specified hypothyroidism  - levothyroxine (SYNTHROID) 25 MCG tablet; Take it daily  Dispense: 90 tablet; Refill: 1  4. Recurrent low back pain  - cyclobenzaprine (FLEXERIL) 5 MG tablet; TAKE 1 TABLET BY MOUTH AT BEDTIME AS NEEDED FOR MUSCLE SPASM  Dispense: 90 tablet; Refill: 1  5. Atherosclerosis of abdominal aorta (HCC)  - atorvastatin (LIPITOR) 40 MG tablet; Take 1 tablet (40 mg total) by mouth daily.  Dispense: 90 tablet; Refill: 1  6. Gastro-esophageal reflux disease without esophagitis  - omeprazole (PRILOSEC) 40 MG capsule; TAKE 1 CAPSULE BY MOUTH EVERY DAY  Dispense: 90 capsule; Refill: 1 I discussed the assessment and treatment plan with the patient. The patient was provided an opportunity to ask questions and all were answered. The patient agreed with the plan and demonstrated an understanding of the instructions.  The patient was advised to call back or seek an in-person evaluation if the symptoms worsen or if the condition fails to improve as anticipated.  I provided 25 minutes of non-face-to-face time during this  encounter.

## 2019-05-27 ENCOUNTER — Telehealth: Payer: HRSA Program | Admitting: Nurse Practitioner

## 2019-05-27 DIAGNOSIS — Z20828 Contact with and (suspected) exposure to other viral communicable diseases: Secondary | ICD-10-CM | POA: Diagnosis not present

## 2019-05-27 DIAGNOSIS — Z20822 Contact with and (suspected) exposure to covid-19: Secondary | ICD-10-CM

## 2019-05-27 DIAGNOSIS — R05 Cough: Secondary | ICD-10-CM | POA: Diagnosis not present

## 2019-05-27 DIAGNOSIS — R059 Cough, unspecified: Secondary | ICD-10-CM

## 2019-05-27 MED ORDER — BENZONATATE 100 MG PO CAPS: 100.0000 mg | ORAL_CAPSULE | Freq: Three times a day (TID) | ORAL | 0 refills | Status: DC | PRN

## 2019-05-27 NOTE — Progress Notes (Signed)
E-Visit for Corona Virus Screening   Your current symptoms could be consistent with the coronavirus.  Many health care providers can now test patients at their office but not all are.  [place] has multiple testing sites. For information on our COVID testing locations and hours go to [email or web address]  * if you aare getting better, testing is up to you. If you do have covid and symptoms are as minor as you describe. Then there is nothing more we are going to do for you at this point anyway. Is you just want to know if you have it , you certaoinly can go get tested. Either way it sounds viral and symptomatic treatment is all you need. You also need to quarantine until mo symptoms for 48 hours. Testing Information: The COVID-19 Community Testing sites will begin testing BY APPOINTMENT ONLY starting the week of December 15th and December 16th (see go-live dates by location below).  You can begin scheduling online at [email or web address]  If you do not have access to a smart phone or computer you may call [phone] for an appointment. . In addition, starting the week of December 14th we will move INDOORS for testing (see locations below). . Community testing site appointment hours will be 8 a.m. to 3:30 p.m.   Testing Locations: [place] will begin Tuesday December 15th. Closed on Monday December 14th to relocate indoors to [address], [place] [address] [facility] Appointments will begin Wednesday December 16th. Closed on Tuesday December 15th for move to indoors at [place]. [address] [place], [place] [address] [facility] Appointments will begin Wednesday December 16th. Closed on Tuesday December 15th for move to indoors at [address] [place] [address]   We are enrolling you in our [place] for [place] . Daily you will receive a questionnaire within the [place] website. Our COVID 19 response team will be monitoring your  responses daily.  Please quarantine yourself while awaiting your test results. If you develop fever/cough/breathlessness, please stay home for 10 days with improving symptoms and until you have had 24 hours of no fever (without taking a fever reducer).  You should wear a mask or cloth face covering over your nose and mouth if you must be around other people or animals, including pets (even at home). Try to stay at least 6 feet away from other people. This will protect the people around you.  Please continue good preventive care measures, including:  frequent hand-washing, avoid touching your face, cover coughs/sneezes, stay out of crowds and keep a 6 foot distance from others.  COVID-19 is a respiratory illness with symptoms that are similar to the flu. Symptoms are typically mild to moderate, but there have been cases of severe illness and death due to the virus.   The following symptoms may appear 2-14 days after exposure: . Fever . Cough . Shortness of breath or difficulty breathing . Chills . Repeated shaking with chills . Muscle pain . Headache . Sore throat . New loss of taste or smell . Fatigue . Congestion or runny nose . Nausea or vomiting . Diarrhea  Go to the nearest hospital ED for assessment if fever/cough/breathlessness are severe or illness seems like a threat to life.  It is vitally important that if you feel that you have an infection such as this virus or any other virus that you stay home and away from places where you may spread it to others.  You should avoid  contact with people age 52 and older.   You can use medication such as A prescription cough medication called Tessalon Perles 100 mg. You may take 1-2 capsules every 8 hours as needed for cough  You may also take acetaminophen (Tylenol) as needed for fever.  Reduce your risk of any infection by using the same precautions used for avoiding the common cold or flu:  [place] Kitchen Wash your hands often with soap and warm water for  at least 20 seconds.  If soap and water are not readily available, use an alcohol-based hand sanitizer with at least 60% alcohol.  . If coughing or sneezing, cover your mouth and nose by coughing or sneezing into the elbow areas of your shirt or coat, into a tissue or into your sleeve (not your hands). . Avoid shaking hands with others and consider head nods or verbal greetings only. . Avoid touching your eyes, nose, or mouth with unwashed hands.  . Avoid close contact with people who are sick. . Avoid places or events with large numbers of people in one location, like concerts or sporting events. . Carefully consider travel plans you have or are making. . If you are planning any travel outside or inside the [place], visit the CDC's Travelers' Health webpage for the latest health notices. . If you have some symptoms but not all symptoms, continue to monitor at home and seek medical attention if your symptoms worsen. . If you are having a medical emergency, call 911.  HOME CARE . Only take medications as instructed by your medical team. . Drink plenty of fluids and get plenty of rest. . A steam or ultrasonic humidifier can help if you have congestion.   GET HELP RIGHT AWAY IF YOU HAVE EMERGENCY WARNING SIGNS** FOR COVID-19. If you or someone is showing any of these signs seek emergency medical care immediately. Call 911 or proceed to your closest emergency facility if: . You develop worsening high fever. . Trouble breathing . Bluish lips or face . Persistent pain or pressure in the chest . New confusion . Inability to wake or stay awake . You cough up blood. . Your symptoms become more severe  **This list is not all possible symptoms. Contact your medical provider for any symptoms that are sever or concerning to you.  MAKE SURE YOU   Understand these instructions.  Will watch your condition.  Will get help right away if you are not doing well or get worse.  Your e-visit answers were  reviewed by a board certified advanced clinical practitioner to complete your personal care plan.  Depending on the condition, your plan could have included both over the counter or prescription medications.  If there is a problem please reply once you have received a response from your provider.  Your safety is important to [place].  If you have drug allergies check your prescription carefully.    You can use MyChart to ask questions about today's visit, request a non-urgent call back, or ask for a work or school excuse for 24 hours related to this e-Visit. If it has been greater than 24 hours you will need to follow up with your provider, or enter a new e-Visit to address those concerns. You will get an e-mail in the next two days asking about your experience.  I hope that your e-visit has been valuable and will speed your recovery. Thank you for using e-visits.   5-10 minutes spent reviewing and documenting in chart.

## 2019-10-18 ENCOUNTER — Encounter: Payer: Self-pay | Admitting: Family Medicine

## 2019-10-19 ENCOUNTER — Other Ambulatory Visit: Payer: Self-pay | Admitting: Family Medicine

## 2019-10-19 DIAGNOSIS — F411 Generalized anxiety disorder: Secondary | ICD-10-CM

## 2019-10-19 DIAGNOSIS — M545 Low back pain, unspecified: Secondary | ICD-10-CM

## 2019-10-19 DIAGNOSIS — F3162 Bipolar disorder, current episode mixed, moderate: Secondary | ICD-10-CM

## 2019-10-19 NOTE — Telephone Encounter (Signed)
Requested medication (s) are due for refill today: yes  Requested medication (s) are on the active medication list: yes  Last refill:  05/05/19 for both meds  Future visit scheduled: no  Notes to clinic:  meds not delegated to refill   Requested Prescriptions  Pending Prescriptions Disp Refills   cyclobenzaprine (FLEXERIL) 5 MG tablet [Pharmacy Med Name: CYCLOBENZAPRINE 5 MG TABLET] 90 tablet 0    Sig: TAKE ONE TABLET BY MOUTH AT BEDTIME AS NEEDED FOR MUSCLE SPASM      Not Delegated - Analgesics:  Muscle Relaxants Failed - 10/19/2019 11:42 AM      Failed - This refill cannot be delegated      Passed - Valid encounter within last 6 months    Recent Outpatient Visits           5 months ago Bipolar 1 disorder, mixed, moderate ([place])   [facility], Drue Stager, MD   8 months ago Bipolar 1 disorder, mixed, moderate ([place])   [facility] [place], Drue Stager, MD   11 months ago Atherosclerosis of abdominal aorta [facility])   [facility] Steele Sizer, MD   1 year ago Vitamin D deficiency   [facility] Steele Sizer, MD   1 year ago Prediabetes   [facility] [place], Drue Stager, MD                QUEtiapine (SEROQUEL) 100 MG tablet [Pharmacy Med Name: QUEtiapine FUMARATE 100 MG TAB] 135 tablet 0    Sig: TAKE 1.5 TABLETS BY MOUTH AT BEDTIME      Not Delegated - Psychiatry:  Antipsychotics - Second Generation (Atypical) - quetiapine Failed - 10/19/2019 11:42 AM      Failed - This refill cannot be delegated      Failed - ALT in normal range and within 180 days    ALT  Date Value Ref Range Status  02/03/2019 10 6 - 29 U/L Final   SGPT (ALT)  Date Value Ref Range Status  03/03/2014 28 U/L Final    Comment:    14-63 NOTE: New Reference Range 12/29/13           Failed - AST in normal range and within 180 days    AST  Date Value Ref Range Status  02/03/2019 16 10 - 35 U/L Final    SGOT(AST)  Date Value Ref Range Status  03/03/2014 38 (H) 15 - 37 Unit/L Final          Passed - Completed PHQ-2 or PHQ-9 in the last 360 days.      Passed - Last BP in normal range    BP Readings from Last 1 Encounters:  07/31/18 120/80          Passed - Valid encounter within last 6 months    Recent Outpatient Visits           5 months ago Bipolar 1 disorder, mixed, moderate ([place])   [facility] [place], Drue Stager, MD   8 months ago Bipolar 1 disorder, mixed, moderate [facility])   [facility] Steele Sizer, MD   11 months ago Atherosclerosis of abdominal aorta [facility])   [facility] Steele Sizer, MD   1 year ago Vitamin D deficiency   [facility] Steele Sizer, MD   1 year ago Prediabetes   [facility] Steele Sizer, MD

## 2019-10-20 NOTE — Telephone Encounter (Signed)
lvm for pt to schedule appt with Dr Ancil Boozer. Dr Ancil Boozer will call her in enough of the medication once the appt has been scheduled

## 2019-10-21 ENCOUNTER — Other Ambulatory Visit: Payer: Self-pay

## 2019-10-21 ENCOUNTER — Other Ambulatory Visit: Payer: Self-pay | Admitting: Family Medicine

## 2019-10-21 ENCOUNTER — Ambulatory Visit (INDEPENDENT_AMBULATORY_CARE_PROVIDER_SITE_OTHER): Payer: No Typology Code available for payment source | Admitting: Family Medicine

## 2019-10-21 ENCOUNTER — Encounter: Payer: Self-pay | Admitting: Family Medicine

## 2019-10-21 VITALS — BP 124/82 | HR 96 | Temp 97.5°F | Resp 16 | Ht 65.0 in | Wt 164.4 lb

## 2019-10-21 DIAGNOSIS — C8190 Hodgkin lymphoma, unspecified, unspecified site: Secondary | ICD-10-CM

## 2019-10-21 DIAGNOSIS — Z1231 Encounter for screening mammogram for malignant neoplasm of breast: Secondary | ICD-10-CM | POA: Diagnosis not present

## 2019-10-21 DIAGNOSIS — Z1211 Encounter for screening for malignant neoplasm of colon: Secondary | ICD-10-CM

## 2019-10-21 DIAGNOSIS — M545 Low back pain, unspecified: Secondary | ICD-10-CM

## 2019-10-21 DIAGNOSIS — Z9884 Bariatric surgery status: Secondary | ICD-10-CM

## 2019-10-21 DIAGNOSIS — I700 Atherosclerosis of aorta: Secondary | ICD-10-CM

## 2019-10-21 DIAGNOSIS — F411 Generalized anxiety disorder: Secondary | ICD-10-CM

## 2019-10-21 DIAGNOSIS — Z23 Encounter for immunization: Secondary | ICD-10-CM | POA: Diagnosis not present

## 2019-10-21 DIAGNOSIS — K219 Gastro-esophageal reflux disease without esophagitis: Secondary | ICD-10-CM

## 2019-10-21 DIAGNOSIS — F419 Anxiety disorder, unspecified: Secondary | ICD-10-CM

## 2019-10-21 DIAGNOSIS — E038 Other specified hypothyroidism: Secondary | ICD-10-CM

## 2019-10-21 DIAGNOSIS — E538 Deficiency of other specified B group vitamins: Secondary | ICD-10-CM

## 2019-10-21 DIAGNOSIS — F3162 Bipolar disorder, current episode mixed, moderate: Secondary | ICD-10-CM

## 2019-10-21 DIAGNOSIS — R7303 Prediabetes: Secondary | ICD-10-CM

## 2019-10-21 DIAGNOSIS — E8881 Metabolic syndrome: Secondary | ICD-10-CM

## 2019-10-21 DIAGNOSIS — L729 Follicular cyst of the skin and subcutaneous tissue, unspecified: Secondary | ICD-10-CM

## 2019-10-21 DIAGNOSIS — E559 Vitamin D deficiency, unspecified: Secondary | ICD-10-CM

## 2019-10-21 MED ORDER — HYDROXYZINE HCL 10 MG PO TABS: 10.0000 mg | ORAL_TABLET | Freq: Three times a day (TID) | ORAL | 0 refills | Status: DC | PRN

## 2019-10-21 MED ORDER — GABAPENTIN 100 MG PO CAPS: 100.0000 mg | ORAL_CAPSULE | Freq: Two times a day (BID) | ORAL | 1 refills | Status: DC

## 2019-10-21 MED ORDER — LEVOTHYROXINE SODIUM 25 MCG PO TABS: ORAL_TABLET | ORAL | 1 refills | Status: DC

## 2019-10-21 MED ORDER — ATORVASTATIN CALCIUM 40 MG PO TABS: 40.0000 mg | ORAL_TABLET | Freq: Every day | ORAL | 1 refills | Status: DC

## 2019-10-21 MED ORDER — CYCLOBENZAPRINE HCL 5 MG PO TABS: ORAL_TABLET | ORAL | 1 refills | Status: DC

## 2019-10-21 MED ORDER — OMEPRAZOLE 40 MG PO CPDR: DELAYED_RELEASE_CAPSULE | ORAL | 1 refills | Status: DC

## 2019-10-21 MED ORDER — QUETIAPINE FUMARATE 100 MG PO TABS: 150.0000 mg | ORAL_TABLET | Freq: Every day | ORAL | 1 refills | Status: DC

## 2019-10-21 MED ORDER — DULOXETINE HCL 60 MG PO CPEP: 60.0000 mg | ORAL_CAPSULE | Freq: Every day | ORAL | 1 refills | Status: DC

## 2019-10-21 NOTE — Progress Notes (Signed)
Name: Jessica [name]   MRN: [id number]    DOB: Oct 15, 1965   Date:10/22/2019       Progress Note  Subjective  Chief Complaint  Chief Complaint  Patient presents with  . Manic Behavior    medication refill, follow up  . Hypothyroidism  . Anxiety    HPI  History of obesity: she had bariatric surgery 03/2011, maximum weight prior to surgery was 270 lbs, she went down to 142 lbs and was stable for many years, however since she was diagnosed with bipolarin 2017and was placedon antipsychotic medicationsandgained weight, March 2018 weight was 183 Lbs. She was onLevothyroxine and Metformin andwent down to 165 lbs in May 2018 and we started her on Trulicity to help curb appetite and help with pre-diabetes/insulin resistanceand shewasbelow her goal weight ( it was 140 lbs), down to 129 lbs, she was having nausea and epigastric pain, she finally saw Dr. Tasia Catchings, came off Nsaid's and was given carafate, omeprazolefor treatment or an peptic  ulcerand given iron infusion.She is now back on Trulicity, approved through [employer] program.She is unable to eat a full meal, snacks throughout the day , weight is up from 157 lbs to 164 lbs today , BMI is still at goal   Pre-diabetes:hgbA1C was back in 2013 was 6.3%, but down to 5.3 % with weight loss , she was gradually gaining weight after bariatric surgery( 2012 )and we started her on Metformin 08/2016 and also Synthroid forelevatedTSH . She makes better meal choices and feels like the medication is working well for her. She has been off Metformin since end of 2018 to switch to Trulicity. She was doing well, her weight went down to 129 lbs (but she also had some epigastric pain - later diagnosed with ulcer), she gained weight back but is back on Trulicity and weight is up at 164 lbs today .  B12 deficiency:not currently getting B12 injections but is getting SL B12 at homedaily, she denies tingling or  numbness  Adult hypothyroidism: :  associated with fatigue, and mild weight gain, started on Levothyroxine Fall 2020 and at goal . No hair loss or brittle nails   Hodgkin's lymphoma:she went back to see Dr. Tasia Catchings last year, last CBC did not show anemia, white count was normal 01/2019 , except for some elevation of lymphocytes and eosinophils.   Bipolar:no longer rseeingDr.Eappenbecause of cost,last visit was 01/2019,she has insurance again, but prefers not going back yet, she has been more anxious lately, worried about her mother, recently diagnosed with Multiple Myeloma. Still not sleeping well , discussed going back to Dr. Shea Evans   Insomnia: since not working she has been napping during the day, going to bed late and waking up during the night, feels tired during the day.   Atherosclerosis of aorta: not on statin therapy,reviewed labs, at goal   Cyst on scalp: going on for months, seems to be getting larger, no pain or drainage, discussed referral to dermatologist or surgeon but she would like to hold off for now    Patient Active Problem List   Diagnosis Date Noted  . Iron deficiency anemia 01/09/2018  . Cervical high risk HPV (human papillomavirus) test positive 04/08/2017  . Abdominal pain, epigastric   . Bariatric surgery status   . Ulcer jejunum   . Adenoma of left adrenal gland 02/18/2017  . Abnormal TSH 08/30/2016  . Bipolar disorder ([place]) 12/02/2015  . HAV (hallux abducto valgus) 11/22/2015  . Bunion of great toe 11/08/2015  . Attention-deficit hyperactivity  disorder, other type 10/28/2015  . Sciatica 04/22/2015  . B12 deficiency 12/01/2014  . History of bariatric surgery 12/01/2014  . Anxiety, generalized 12/01/2014  . Gastro-esophageal reflux disease without esophagitis 12/01/2014  . H/O elevated lipids 12/01/2014  . H/O: HTN (hypertension) 12/01/2014  . H/O: obesity 12/01/2014  . Hodgkin's disease in remission ([place]) 12/01/2014  . Current tobacco use 12/01/2014  . Insomnia 03/24/2008  . Abnormal  serum level of alkaline phosphatase 11/08/2006  . Chronic recurrent major depressive disorder ([place]) 11/08/2006    Past Surgical History:  Procedure Laterality Date  . ABDOMINAL HYSTERECTOMY    . BUNIONECTOMY Right   . CARPAL TUNNEL RELEASE    . COLONOSCOPY     multiple  . ESOPHAGOGASTRODUODENOSCOPY (EGD) WITH PROPOFOL N/A 02/21/2017   Procedure: ESOPHAGOGASTRODUODENOSCOPY (EGD) WITH PROPOFOL;  Surgeon: Lucilla Lame, MD;  Location: [facility];  Service: Gastroenterology;  Laterality: N/A;  Pre-diabetic  . excision of neck mass  09/1998  . GASTRIC BYPASS  03/12/2011  . LAPAROSCOPIC SUPRACERVICAL HYSTERECTOMY  2008   and unilateral salpingoophorectomy/ endometriosis-Dr Kincius  . TONSILLECTOMY      Family History  Problem Relation Age of Onset  . Cancer Mother 17       colon  . Epilepsy Mother   . Multiple myeloma Mother   . Glaucoma Father   . Breast cancer Maternal Grandmother 41       inflamitory breast ca    Social History   Tobacco Use  . Smoking status: Current Every Day Smoker    Packs/day: 0.50    Years: 16.00    Pack years: 8.00    Types: Cigarettes    Start date: 06/11/2000  . Smokeless tobacco: Never Used  Substance Use Topics  . Alcohol use: No    Alcohol/week: 0.0 standard drinks    Comment: rarely     Current Outpatient Medications:  .  Ascorbic Acid (VITAMIN C) 1000 MG tablet, Take 1,000 mg by mouth daily., Disp: , Rfl:  .  atorvastatin (LIPITOR) 40 MG tablet, Take 1 tablet (40 mg total) by mouth daily., Disp: 90 tablet, Rfl: 1 .  Cholecalciferol (VITAMIN D) 2000 units CAPS, Take 1 capsule (2,000 Units total) by mouth daily., Disp: 30 capsule, Rfl: 0 .  cyclobenzaprine (FLEXERIL) 5 MG tablet, TAKE 1 TABLET BY MOUTH AT BEDTIME AS NEEDED FOR MUSCLE SPASM, Disp: 90 tablet, Rfl: 1 .  DULoxetine (CYMBALTA) 60 MG capsule, Take 1 capsule (60 mg total) by mouth daily., Disp: 90 capsule, Rfl: 1 .  gabapentin (NEURONTIN) 100 MG capsule, Take 1 capsule  (100 mg total) by mouth 2 (two) times daily., Disp: 180 capsule, Rfl: 1 .  levothyroxine (SYNTHROID) 25 MCG tablet, Take it daily, Disp: 90 tablet, Rfl: 1 .  Multiple Vitamins-Minerals (MULTI-VITAMIN GUMMIES) CHEW, Chew 2 capsules by mouth daily., Disp: 30 tablet, Rfl: 0 .  omeprazole (PRILOSEC) 40 MG capsule, TAKE 1 CAPSULE BY MOUTH EVERY DAY, Disp: 90 capsule, Rfl: 1 .  QUEtiapine (SEROQUEL) 100 MG tablet, Take 1.5 tablets (150 mg total) by mouth at bedtime., Disp: 135 tablet, Rfl: 1 .  TRULICITY 1.5 IO/9.6EX SOPN, Inject 1.5 mg into the skin once a week., Disp: 12 pen, Rfl: 1 .  hydrOXYzine (ATARAX/VISTARIL) 10 MG tablet, Take 1 tablet (10 mg total) by mouth 3 (three) times daily as needed., Disp: 90 tablet, Rfl: 0  Allergies  Allergen Reactions  . Vicodin [Hydrocodone-Acetaminophen] Other (See Comments)    Has really bad headaches/ migraine  I personally reviewed active problem list, medication list, allergies, family history, social history, health maintenance with the patient/caregiver today.   ROS  Constitutional: Negative for fever or weight change.  Respiratory: Negative for cough and shortness of breath.   Cardiovascular: Negative for chest pain or palpitations.  Gastrointestinal: Negative for abdominal pain, no bowel changes.  Musculoskeletal: Negative for gait problem or joint swelling.  Skin: Negative for rash.  Neurological: Negative for dizziness or headache.  No other specific complaints in a complete review of systems (except as listed in HPI above).  Objective  Vitals:   10/21/19 1339  BP: 124/82  Pulse: 96  Resp: 16  Temp: (!) 97.5 F (36.4 C)  TempSrc: Temporal  SpO2: 98%  Weight: 164 lb 6.4 oz (74.6 kg)  Height: 5' 5" (1.651 m)    Body mass index is 27.36 kg/m.  Physical Exam  Constitutional: Patient appears well-developed and well-nourished.  No distress.  HEENT: head atraumatic, normocephalic, pupils equal and reactive to  light Cardiovascular: Normal rate, regular rhythm and normal heart sounds.  No murmur heard. No BLE edema. Pulmonary/Chest: Effort normal and breath sounds normal. No respiratory distress. Abdominal: Soft.  There is no tenderness. Skin: scalp lesion on right parietal area, non tender, soft about 2 cm in size Psychiatric: Patient has a normal mood and affect. behavior is normal. Judgment and thought content normal.  PHQ2/9: Depression screen [facility] 2/9 10/21/2019 05/05/2019 02/02/2019 11/04/2018 10/30/2018  Decreased Interest 0 1 0 0 0  Down, Depressed, Hopeless 0 0 0 3 0  PHQ - 2 Score 0 1 0 3 0  Altered sleeping _0 - 0  Tired, decreased energy _1 - 1  Change in appetite 0 0 0 - 0  Feeling bad or failure about yourself  0 0 0 - 0  Trouble concentrating 0 1 0 - 0  Moving slowly or fidgety/restless 1 1 0 - 0  Suicidal thoughts 0 0 0 - 0  PHQ-9 Score _2 - 1  Difficult doing work/chores Somewhat difficult Somewhat difficult Not difficult at all - Not difficult at all  Some recent data might be hidden    phq 9 is negative   Fall Risk: Fall Risk  10/21/2019 05/05/2019 02/02/2019 10/30/2018 07/31/2018  Falls in the past year? 0 0 0 0 0  Number falls in past yr: 0 0 0 0 -  Injury with Fall? 0 0 0 0 -  Follow up Falls evaluation completed - - - -     Functional Status Survey: Is the patient deaf or have difficulty hearing?: No Does the patient have difficulty seeing, even when wearing glasses/contacts?: No Does the patient have difficulty concentrating, remembering, or making decisions?: No Does the patient have difficulty walking or climbing stairs?: No Does the patient have difficulty dressing or bathing?: No Does the patient have difficulty doing errands alone such as visiting a doctor's office or shopping?: No    Assessment & Plan  1. Atherosclerosis of abdominal aorta (HCC)  - atorvastatin (LIPITOR) 40 MG tablet; Take 1 tablet (40 mg total) by mouth daily.  Dispense: 90  tablet; Refill: 1  2. Need for Tdap vaccination  - Tdap vaccine greater than or equal to 7yo IM  3. Colon cancer screening  - Ambulatory referral to Gastroenterology  4. Breast cancer screening by mammogram  - MM 3D SCREEN BREAST BILATERAL; Future  5. Hodgkin's disease in remission [facility])  Needs follow up with Dr. Tasia Catchings  6. B12 deficiency  Continue supplementation   7. Vitamin D deficiency  On supplementation   8. Insulin resistance  On Trulicity   9. Gastro-esophageal reflux disease without esophagitis  - omeprazole (PRILOSEC) 40 MG capsule; TAKE 1 CAPSULE BY MOUTH EVERY DAY  Dispense: 90 capsule; Refill: 1  10. Other specified hypothyroidism  - levothyroxine (SYNTHROID) 25 MCG tablet; Take it daily  Dispense: 90 tablet; Refill: 1  11. Bipolar 1 disorder, mixed, moderate (HCC)  - gabapentin (NEURONTIN) 100 MG capsule; Take 1 capsule (100 mg total) by mouth 2 (two) times daily.  Dispense: 180 capsule; Refill: 1 - DULoxetine (CYMBALTA) 60 MG capsule; Take 1 capsule (60 mg total) by mouth daily.  Dispense: 90 capsule; Refill: 1 - QUEtiapine (SEROQUEL) 100 MG tablet; Take 1.5 tablets (150 mg total) by mouth at bedtime.  Dispense: 135 tablet; Refill: 1  12. History of bariatric surgery   13. Prediabetes   14. GAD (generalized anxiety disorder)  - gabapentin (NEURONTIN) 100 MG capsule; Take 1 capsule (100 mg total) by mouth 2 (two) times daily.  Dispense: 180 capsule; Refill: 1 - DULoxetine (CYMBALTA) 60 MG capsule; Take 1 capsule (60 mg total) by mouth daily.  Dispense: 90 capsule; Refill: 1 - QUEtiapine (SEROQUEL) 100 MG tablet; Take 1.5 tablets (150 mg total) by mouth at bedtime.  Dispense: 135 tablet; Refill: 1  15. Recurrent low back pain  - cyclobenzaprine (FLEXERIL) 5 MG tablet; TAKE 1 TABLET BY MOUTH AT BEDTIME AS NEEDED FOR MUSCLE SPASM  Dispense: 90 tablet; Refill: 1  16. Anxiety  - hydrOXYzine (ATARAX/VISTARIL) 10 MG tablet; Take 1 tablet (10 mg total)  by mouth 3 (three) times daily as needed.  Dispense: 90 tablet; Refill: 0  17. Scalp cyst  She wants to hold off on referral

## 2019-11-03 ENCOUNTER — Other Ambulatory Visit: Payer: Self-pay

## 2019-11-03 ENCOUNTER — Telehealth (INDEPENDENT_AMBULATORY_CARE_PROVIDER_SITE_OTHER): Payer: Self-pay | Admitting: Gastroenterology

## 2019-11-03 DIAGNOSIS — Z1211 Encounter for screening for malignant neoplasm of colon: Secondary | ICD-10-CM

## 2019-11-03 NOTE — Progress Notes (Signed)
Gastroenterology Pre-Procedure Review  Request Date: 11/19/19 Requesting Physician: Dr. Marius Ditch  PATIENT REVIEW QUESTIONS: The patient responded to the following health history questions as indicated:    1. Are you having any GI issues? no 2. Do you have a personal history of Polyps? yes (pt states it was many years ago with [facility].  I did not see a record of this in Epic.) EGD in 2018 with Dr. Allen Norris Patient stated she is okay with Dr. Marius Ditch for her colonoscopy. 3. Do you have a family history of Colon Cancer or Polyps? no 4. Diabetes Mellitus? no 5. Joint replacements in the past 12 months?no 6. Major health problems in the past 3 months?no 7. Any artificial heart valves, MVP, or defibrillator?no    MEDICATIONS & ALLERGIES:    Patient reports the following regarding taking any anticoagulation/antiplatelet therapy:   Plavix, Coumadin, Eliquis, Xarelto, Lovenox, Pradaxa, Brilinta, or Effient? no Aspirin? no  Patient confirms/reports the following medications:  Current Outpatient Medications  Medication Sig Dispense Refill  . Ascorbic Acid (VITAMIN C) 1000 MG tablet Take 1,000 mg by mouth daily.    Marland Kitchen atorvastatin (LIPITOR) 40 MG tablet Take 1 tablet (40 mg total) by mouth daily. 90 tablet 1  . BIOTIN PO Take by mouth.    . Cholecalciferol (VITAMIN D) 2000 units CAPS Take 1 capsule (2,000 Units total) by mouth daily. 30 capsule 0  . cyclobenzaprine (FLEXERIL) 5 MG tablet TAKE 1 TABLET BY MOUTH AT BEDTIME AS NEEDED FOR MUSCLE SPASM 90 tablet 1  . DULoxetine (CYMBALTA) 60 MG capsule Take 1 capsule (60 mg total) by mouth daily. 90 capsule 1  . gabapentin (NEURONTIN) 100 MG capsule Take 1 capsule (100 mg total) by mouth 2 (two) times daily. 180 capsule 1  . hydrOXYzine (ATARAX/VISTARIL) 10 MG tablet Take 1 tablet (10 mg total) by mouth 3 (three) times daily as needed. 90 tablet 0  . levothyroxine (SYNTHROID) 25 MCG tablet Take it daily 90 tablet 1  . Multiple Vitamins-Minerals  (MULTI-VITAMIN GUMMIES) CHEW Chew 2 capsules by mouth daily. 30 tablet 0  . omeprazole (PRILOSEC) 40 MG capsule TAKE 1 CAPSULE BY MOUTH EVERY DAY 90 capsule 1  . Probiotic Product (PROBIOTIC DAILY PO) Take by mouth.    . QUEtiapine (SEROQUEL) 100 MG tablet Take 1.5 tablets (150 mg total) by mouth at bedtime. 135 tablet 1  . TRULICITY 1.5 0000000 SOPN Inject 1.5 mg into the skin once a week. 12 pen 1   No current facility-administered medications for this visit.    Patient confirms/reports the following allergies:  Allergies  Allergen Reactions  . Vicodin [Hydrocodone-Acetaminophen] Other (See Comments)    Has really bad headaches/ migraine      No orders of the defined types were placed in this encounter.   AUTHORIZATION INFORMATION Primary Insurance: 1D#: Group #:  Secondary Insurance: 1D#: Group #:  SCHEDULE INFORMATION: Date: 11/19/19 Time: Location:[facility]

## 2019-11-18 ENCOUNTER — Other Ambulatory Visit: Payer: Self-pay

## 2019-11-18 ENCOUNTER — Other Ambulatory Visit
Admission: RE | Admit: 2019-11-18 | Discharge: 2019-11-18 | Disposition: A | Discharge status: Home / Self Care | Payer: No Typology Code available for payment source | Source: Ambulatory Visit | Attending: Gastroenterology | Admitting: Gastroenterology

## 2019-11-18 DIAGNOSIS — Z01812 Encounter for preprocedural laboratory examination: Secondary | ICD-10-CM | POA: Insufficient documentation

## 2019-11-18 DIAGNOSIS — Z20822 Contact with and (suspected) exposure to covid-19: Secondary | ICD-10-CM | POA: Insufficient documentation

## 2019-11-18 LAB — SARS CORONAVIRUS 2 (TAT 6-24 HRS): SARS Coronavirus 2: NEGATIVE

## 2019-11-20 ENCOUNTER — Ambulatory Visit: Payer: No Typology Code available for payment source | Admitting: Certified Registered Nurse Anesthetist

## 2019-11-20 ENCOUNTER — Other Ambulatory Visit: Payer: Self-pay

## 2019-11-20 ENCOUNTER — Ambulatory Visit
Admission: RE | Admit: 2019-11-20 | Discharge: 2019-11-20 | Disposition: A | Discharge status: Home / Self Care | Payer: No Typology Code available for payment source | Attending: Gastroenterology | Admitting: Gastroenterology

## 2019-11-20 ENCOUNTER — Encounter: Payer: Self-pay | Admitting: Gastroenterology

## 2019-11-20 ENCOUNTER — Encounter
Admission: RE | Disposition: A | Discharge status: Home / Self Care | Payer: Self-pay | Source: Home / Self Care | Attending: Gastroenterology

## 2019-11-20 DIAGNOSIS — E039 Hypothyroidism, unspecified: Secondary | ICD-10-CM | POA: Insufficient documentation

## 2019-11-20 DIAGNOSIS — K219 Gastro-esophageal reflux disease without esophagitis: Secondary | ICD-10-CM | POA: Insufficient documentation

## 2019-11-20 DIAGNOSIS — Z79890 Hormone replacement therapy (postmenopausal): Secondary | ICD-10-CM | POA: Insufficient documentation

## 2019-11-20 DIAGNOSIS — Z9884 Bariatric surgery status: Secondary | ICD-10-CM | POA: Insufficient documentation

## 2019-11-20 DIAGNOSIS — Z8571 Personal history of Hodgkin lymphoma: Secondary | ICD-10-CM | POA: Diagnosis not present

## 2019-11-20 DIAGNOSIS — F319 Bipolar disorder, unspecified: Secondary | ICD-10-CM | POA: Insufficient documentation

## 2019-11-20 DIAGNOSIS — D124 Benign neoplasm of descending colon: Secondary | ICD-10-CM | POA: Diagnosis not present

## 2019-11-20 DIAGNOSIS — Z800 Family history of malignant neoplasm of digestive organs: Secondary | ICD-10-CM | POA: Diagnosis present

## 2019-11-20 DIAGNOSIS — Z1211 Encounter for screening for malignant neoplasm of colon: Secondary | ICD-10-CM | POA: Insufficient documentation

## 2019-11-20 DIAGNOSIS — D122 Benign neoplasm of ascending colon: Secondary | ICD-10-CM | POA: Diagnosis not present

## 2019-11-20 DIAGNOSIS — R7303 Prediabetes: Secondary | ICD-10-CM | POA: Diagnosis not present

## 2019-11-20 DIAGNOSIS — F419 Anxiety disorder, unspecified: Secondary | ICD-10-CM | POA: Insufficient documentation

## 2019-11-20 DIAGNOSIS — Z79899 Other long term (current) drug therapy: Secondary | ICD-10-CM | POA: Insufficient documentation

## 2019-11-20 DIAGNOSIS — F17210 Nicotine dependence, cigarettes, uncomplicated: Secondary | ICD-10-CM | POA: Insufficient documentation

## 2019-11-20 HISTORY — PX: COLONOSCOPY WITH PROPOFOL: SHX5780

## 2019-11-20 SURGERY — COLONOSCOPY WITH PROPOFOL
Anesthesia: General

## 2019-11-20 MED ORDER — SODIUM CHLORIDE 0.9 % IV SOLN: INTRAVENOUS | Status: DC

## 2019-11-20 MED ORDER — LIDOCAINE HCL (CARDIAC) PF 100 MG/5ML IV SOSY
PREFILLED_SYRINGE | INTRAVENOUS | Status: DC | PRN
  Administered 2019-11-20: 50 mg via INTRAVENOUS

## 2019-11-20 MED ORDER — PROPOFOL 10 MG/ML IV BOLUS
INTRAVENOUS | Status: DC | PRN
  Administered 2019-11-20: 60 mg via INTRAVENOUS
  Administered 2019-11-20: 50 mg via INTRAVENOUS

## 2019-11-20 MED ORDER — GLUCAGON HCL RDNA (DIAGNOSTIC) 1 MG IJ SOLR
INTRAMUSCULAR | Status: DC | PRN
Start: 2019-11-20 — End: 2019-11-20
  Administered 2019-11-20: 1 mg via INTRAVENOUS

## 2019-11-20 MED ORDER — PROPOFOL 500 MG/50ML IV EMUL
INTRAVENOUS | Status: AC
  Filled 2019-11-20: qty 50

## 2019-11-20 MED ORDER — SODIUM CHLORIDE (PF) 0.9 % IJ SOLN
INTRAMUSCULAR | Status: DC | PRN
  Administered 2019-11-20: 1 mL via INTRAVENOUS
  Administered 2019-11-20: 10 mL via INTRAVENOUS

## 2019-11-20 MED ORDER — PROPOFOL 500 MG/50ML IV EMUL
INTRAVENOUS | Status: DC | PRN
  Administered 2019-11-20: 150 ug/kg/min via INTRAVENOUS

## 2019-11-20 MED ORDER — PHENYLEPHRINE HCL (PRESSORS) 10 MG/ML IV SOLN
INTRAVENOUS | Status: DC | PRN
  Administered 2019-11-20: 200 ug via INTRAVENOUS

## 2019-11-20 NOTE — Op Note (Signed)
[facility] Patient Name: Jessica [name] Procedure Date: 11/20/2019 9:05 AM MRN: [id number] Account #: [id number] Date of Birth: [birth year] Admit Type: Outpatient Age: 54 Room: [facility] ENDO ROOM 3 Gender: Female Note Status: Finalized Procedure:             Colonoscopy Indications:           Screening in patient at increased risk: Colorectal                         cancer in mother before age 94, Last colonoscopy:                         August 2008 Providers:             Lin Landsman MD, MD Referring MD:          Bethena Roys. Sowles, MD (Referring MD) Medicines:             Monitored Anesthesia Care Complications:         No immediate complications. Estimated blood loss: None. Procedure:             Pre-Anesthesia Assessment:                        - Prior to the procedure, a History and Physical was                         performed, and patient medications and allergies were                         reviewed. The patient is competent. The risks and                         benefits of the procedure and the sedation options and                         risks were discussed with the patient. All questions                         were answered and informed consent was obtained.                         Patient identification and proposed procedure were                         verified by the physician, the nurse, the                         anesthesiologist, the anesthetist and the technician                         in the pre-procedure area in the procedure room in the                         endoscopy suite. Mental Status Examination: alert and                         oriented. Airway Examination: normal oropharyngeal  airway and neck mobility. Respiratory Examination:                         clear to auscultation. CV Examination: normal.                         Prophylactic Antibiotics: The patient does not require                          prophylactic antibiotics. Prior Anticoagulants: The                         patient has taken no previous anticoagulant or                         antiplatelet agents. ASA Grade Assessment: III - A                         patient with severe systemic disease. After reviewing                         the risks and benefits, the patient was deemed in                         satisfactory condition to undergo the procedure. The                         anesthesia plan was to use monitored anesthesia care                         (MAC). Immediately prior to administration of                         medications, the patient was re-assessed for adequacy                         to receive sedatives. The heart rate, respiratory                         rate, oxygen saturations, blood pressure, adequacy of                         pulmonary ventilation, and response to care were                         monitored throughout the procedure. The physical                         status of the patient was re-assessed after the                         procedure.                        After obtaining informed consent, the colonoscope was                         passed under direct vision. Throughout the procedure,  the patient's blood pressure, pulse, and oxygen                         saturations were monitored continuously. The                         Colonoscope was introduced through the anus and                         advanced to the the cecum, identified by appendiceal                         orifice and ileocecal valve. The colonoscopy was                         performed with moderate difficulty due to inadequate                         bowel prep and significant looping. Successful                         completion of the procedure was aided by applying                         abdominal pressure and lavage. The patient tolerated                         the  procedure well. The quality of the bowel                         preparation was evaluated using the BBPS [facility] Bowel                         Preparation Scale) with scores of: Right Colon = 2                         (minor amount of residual staining, small fragments of                         stool and/or opaque liquid, but mucosa seen well),                         Transverse Colon = 2 (minor amount of residual                         staining, small fragments of stool and/or opaque                         liquid, but mucosa seen well) and Left Colon = 2                         (minor amount of residual staining, small fragments of                         stool and/or opaque liquid, but mucosa seen well). The  total BBPS score equals 6. Findings:      The perianal and digital rectal examinations were normal. Pertinent       negatives include normal sphincter tone and no palpable rectal lesions.      A 10 mm polyp was found in the ascending colon. The polyp was flat.       Preparations were made for mucosal resection. Saline was injected to       raise the lesion. Snare mucosal resection was performed. Resection and       retrieval were complete.      A 15 mm polyp was found in the ascending colon. The polyp was sessile.       The polyp was removed with a hot snare. Resection and retrieval were       complete.      A 6 mm polyp was found in the descending colon. The polyp was sessile.       The polyp was removed with a cold snare. Resection and retrieval were       complete.      Copious quantities of semi-liquid stool was found in the entire colon,       precluding visualization. Lavage of the area was performed using 200 -       500 mL of sterile water, resulting in clearance with fair visualization.      The retroflexed view of the distal rectum and anal verge was normal and       showed no anal or rectal abnormalities. Impression:            - One 10 mm  polyp in the ascending colon, removed with                         mucosal resection. Resected and retrieved.                        - One 15 mm polyp in the ascending colon, removed with                         a hot snare. Resected and retrieved.                        - One 6 mm polyp in the descending colon, removed with                         a cold snare. Resected and retrieved.                        - Stool in the entire examined colon.                        - The distal rectum and anal verge are normal on                         retroflexion view.                        - Mucosal resection was performed. Resection and                         retrieval were complete. Recommendation:        -  Discharge patient to home (with escort).                        - Resume previous diet today.                        - Continue present medications.                        - Await pathology results.                        - Repeat colonoscopy in 3 years for surveillance of                         multiple polyps. Procedure Code(s):     --- Professional ---                        [phone], Colonoscopy, flexible; with endoscopic mucosal                         resection                        [id number], 44, Colonoscopy, flexible; with removal of                         tumor(s), polyp(s), or other lesion(s) by snare                         technique Diagnosis Code(s):     --- Professional ---                        Z80.0, Family history of malignant neoplasm of                         digestive organs                        K63.5, Polyp of colon CPT copyright 2019 [employer] Association. All rights reserved. The codes documented in this report are preliminary and upon coder review may  be revised to meet current compliance requirements. Dr. Ulyess Mort Lin Landsman MD, MD 11/20/2019 9:54:46 AM This report has been signed electronically. Number of Addenda: 0 Note Initiated On:  11/20/2019 9:05 AM Scope Withdrawal Time: 0 hours 28 minutes 54 seconds  Total Procedure Duration: 0 hours 32 minutes 55 seconds  Estimated Blood Loss:  Estimated blood loss: none.      [facility]

## 2019-11-20 NOTE — H&P (Signed)
Jessica Darby, MD [address]  [place]  [place], [place] [address]  Main: [phone]  Fax: [phone] Pager: [phone]  Primary Care Physician:  Steele Sizer, MD Primary Gastroenterologist:  Dr. Cephas [name]  Pre-Procedure History & Physical: HPI:  Jessica [name] is a 54 y.o. female is here for an colonoscopy.   Past Medical History:  Diagnosis Date  . ADHD (attention deficit hyperactivity disorder)   . Anxiety   . Bipolar 1 disorder ([place])   . Cancer ([place])    Hodgkin's Lymphoma 09/1998  . Depression   . Endometriosis   . GERD (gastroesophageal reflux disease)   . History of gastric bypass 2012  . Hodgkin's lymphoma ([place]) 2000  . Hypothyroidism   . Pre-diabetes   . Tobacco use disorder   . Ulcer jejunum     Past Surgical History:  Procedure Laterality Date  . ABDOMINAL HYSTERECTOMY    . BUNIONECTOMY Right   . CARPAL TUNNEL RELEASE    . COLONOSCOPY     multiple  . ESOPHAGOGASTRODUODENOSCOPY (EGD) WITH PROPOFOL N/A 02/21/2017   Procedure: ESOPHAGOGASTRODUODENOSCOPY (EGD) WITH PROPOFOL;  Surgeon: Lucilla Lame, MD;  Location: [facility];  Service: Gastroenterology;  Laterality: N/A;  Pre-diabetic  . excision of neck mass  09/1998  . GASTRIC BYPASS  03/12/2011  . LAPAROSCOPIC SUPRACERVICAL HYSTERECTOMY  2008   and unilateral salpingoophorectomy/ endometriosis-Dr Kincius  . TONSILLECTOMY      Prior to Admission medications   Medication Sig Start Date End Date Taking? Authorizing Provider  Ascorbic Acid (VITAMIN C) 1000 MG tablet Take 1,000 mg by mouth daily.    [provider]  atorvastatin (LIPITOR) 40 MG tablet Take 1 tablet (40 mg total) by mouth daily. 10/21/19   Steele Sizer, MD  BIOTIN PO Take by mouth.    [provider]  Cholecalciferol (VITAMIN D) 2000 units CAPS Take 1 capsule (2,000 Units total) by mouth daily. 01/22/17   Steele Sizer, MD  cyclobenzaprine (FLEXERIL) 5 MG tablet TAKE 1 TABLET BY MOUTH AT  BEDTIME AS NEEDED FOR MUSCLE SPASM 10/21/19   Steele Sizer, MD  DULoxetine (CYMBALTA) 60 MG capsule Take 1 capsule (60 mg total) by mouth daily. 10/21/19   Steele Sizer, MD  gabapentin (NEURONTIN) 100 MG capsule Take 1 capsule (100 mg total) by mouth 2 (two) times daily. 10/21/19   Steele Sizer, MD  hydrOXYzine (ATARAX/VISTARIL) 10 MG tablet Take 1 tablet (10 mg total) by mouth 3 (three) times daily as needed. 10/21/19   Steele Sizer, MD  levothyroxine (SYNTHROID) 25 MCG tablet Take it daily 10/21/19   Steele Sizer, MD  Multiple Vitamins-Minerals (MULTI-VITAMIN GUMMIES) CHEW Chew 2 capsules by mouth daily. 01/22/17   Steele Sizer, MD  omeprazole (PRILOSEC) 40 MG capsule TAKE 1 CAPSULE BY MOUTH EVERY DAY 10/21/19   Steele Sizer, MD  Probiotic Product (PROBIOTIC DAILY PO) Take by mouth.    [provider]  QUEtiapine (SEROQUEL) 100 MG tablet Take 1.5 tablets (150 mg total) by mouth at bedtime. 10/21/19   Steele Sizer, MD  TRULICITY 1.5 QV/9.5GL SOPN Inject 1.5 mg into the skin once a week. 11/26/18   Steele Sizer, MD    Allergies as of 11/03/2019 - Review Complete 11/03/2019  Allergen Reaction Noted  . Vicodin [hydrocodone-acetaminophen] Other (See Comments) 12/30/2015    Family History  Problem Relation Age of Onset  . Cancer Mother 15       colon  . Epilepsy Mother   . Multiple myeloma Mother   .  Glaucoma Father   . Breast cancer Maternal Grandmother 83       inflamitory breast ca    Social History   Socioeconomic History  . Marital status: Married    Spouse name: allen  . Number of children: 2  . Years of education: Not on file  . Highest education level: Associate degree: occupational, Hotel manager, or vocational program  Occupational History  . Occupation: house wife   Tobacco Use  . Smoking status: Current Every Day Smoker    Packs/day: 0.50    Years: 16.00    Pack years: 8.00    Types: Cigarettes    Start date: 06/11/2000  . Smokeless tobacco:  Never Used  Vaping Use  . Vaping Use: Some days  Substance and Sexual Activity  . Alcohol use: No    Alcohol/week: 0.0 standard drinks    Comment: rarely  . Drug use: No  . Sexual activity: Yes    Partners: Male    Birth control/protection: Surgical  Other Topics Concern  . Not on file  Social History Narrative  . Not on file   Social Determinants of Health   Financial Resource Strain:   . Difficulty of Paying Living Expenses:   Food Insecurity: No Food Insecurity  . Worried About Charity fundraiser in the Last Year: Never true  . Ran Out of Food in the Last Year: Never true  Transportation Needs: No Transportation Needs  . Lack of Transportation (Medical): No  . Lack of Transportation (Non-Medical): No  Physical Activity:   . Days of Exercise per Week:   . Minutes of Exercise per Session:   Stress:   . Feeling of Stress :   Social Connections:   . Frequency of Communication with Friends and Family:   . Frequency of Social Gatherings with Friends and Family:   . Attends Religious Services:   . Active Member of Clubs or Organizations:   . Attends Archivist Meetings:   Marland Kitchen Marital Status:   Intimate Partner Violence: Not At Risk  . Fear of Current or Ex-Partner: No  . Emotionally Abused: No  . Physically Abused: No  . Sexually Abused: No    Review of Systems: See HPI, otherwise negative ROS  Physical Exam: BP (!) 137/95   Pulse 80   Temp 97.7 F (36.5 C) (Temporal)   Resp 20   Ht _0  (1.676 m)   Wt 74.8 kg   SpO2 100%   BMI 26.63 kg/m  General:   Alert,  pleasant and cooperative in NAD Head:  Normocephalic and atraumatic. Neck:  Supple; no masses or thyromegaly. Lungs:  Clear throughout to auscultation.    Heart:  Regular rate and rhythm. Abdomen:  Soft, nontender and nondistended. Normal bowel sounds, without guarding, and without rebound.   Neurologic:  Alert and  oriented x4;  grossly normal neurologically.  Impression/Plan: Jessica [name] is here for an colonoscopy to be performed for colon cancer screening, family h/o colon cancer in mother  Risks, benefits, limitations, and alternatives regarding  colonoscopy have been reviewed with the patient.  Questions have been answered.  All parties agreeable.   Sherri Sear, MD  11/20/2019, 9:05 AM

## 2019-11-20 NOTE — Anesthesia Postprocedure Evaluation (Signed)
Anesthesia Post Note  Patient: Jessica [name]  Procedure(s) Performed: COLONOSCOPY WITH PROPOFOL (N/A )  Patient location during evaluation: PACU Anesthesia Type: General Level of consciousness: awake and alert Pain management: pain level controlled Vital Signs Assessment: post-procedure vital signs reviewed and stable Respiratory status: spontaneous breathing, nonlabored ventilation, respiratory function stable and patient connected to nasal cannula oxygen Cardiovascular status: blood pressure returned to baseline and stable Postop Assessment: no apparent nausea or vomiting Anesthetic complications: no   No complications documented.   Last Vitals:  Vitals:   11/20/19 0955 11/20/19 1005  BP: 100/63 128/81  Pulse: 90 94  Resp: 13 16  Temp: 36.6 C   SpO2: 100% 100%    Last Pain:  Vitals:   11/20/19 1005  TempSrc:   PainSc: 0-No pain                 Molli Barrows

## 2019-11-20 NOTE — Anesthesia Preprocedure Evaluation (Signed)
Anesthesia Evaluation  Patient identified by MRN, date of birth, ID band Patient awake    Reviewed: Allergy & Precautions, H&P , NPO status , Patient's Chart, lab work & pertinent test results, reviewed documented beta blocker date and time   Airway Mallampati: II   Neck ROM: full    Dental  (+) Poor Dentition   Pulmonary neg pulmonary ROS, Current Smoker,    Pulmonary exam normal        Cardiovascular Exercise Tolerance: Good negative cardio ROS Normal cardiovascular exam Rhythm:regular Rate:Normal     Neuro/Psych PSYCHIATRIC DISORDERS Anxiety Depression Bipolar Disorder  Neuromuscular disease    GI/Hepatic Neg liver ROS, PUD, GERD  Medicated,  Endo/Other  Hypothyroidism   Renal/GU negative Renal ROS  negative genitourinary   Musculoskeletal   Abdominal   Peds  Hematology  (+) Blood dyscrasia, anemia ,   Anesthesia Other Findings Past Medical History: No date: ADHD (attention deficit hyperactivity disorder) No date: Anxiety No date: Bipolar 1 disorder ([place]) No date: Cancer [facility])     Comment:  Hodgkin's Lymphoma 09/1998 No date: Depression No date: Endometriosis No date: GERD (gastroesophageal reflux disease) 2012: History of gastric bypass 2000: Hodgkin's lymphoma ([place]) No date: Hypothyroidism No date: Pre-diabetes No date: Tobacco use disorder No date: Ulcer jejunum Past Surgical History: No date: ABDOMINAL HYSTERECTOMY No date: BUNIONECTOMY; Right No date: CARPAL TUNNEL RELEASE No date: COLONOSCOPY     Comment:  multiple 02/21/2017: ESOPHAGOGASTRODUODENOSCOPY (EGD) WITH PROPOFOL; N/A     Comment:  Procedure: ESOPHAGOGASTRODUODENOSCOPY (EGD) WITH               PROPOFOL;  Surgeon: Lucilla Lame, MD;  Location: [facility];  Service: Gastroenterology;  Laterality:               N/A;  Pre-diabetic 09/1998: excision of neck mass 03/12/2011: GASTRIC BYPASS 2008: LAPAROSCOPIC  SUPRACERVICAL HYSTERECTOMY     Comment:  and unilateral salpingoophorectomy/ endometriosis-Dr               Kincius No date: TONSILLECTOMY   Reproductive/Obstetrics negative OB ROS                             Anesthesia Physical Anesthesia Plan  ASA: III  Anesthesia Plan: General   Post-op Pain Management:    Induction:   PONV Risk Score and Plan:   Airway Management Planned:   Additional Equipment:   Intra-op Plan:   Post-operative Plan:   Informed Consent: I have reviewed the patients History and Physical, chart, labs and discussed the procedure including the risks, benefits and alternatives for the proposed anesthesia with the patient or authorized representative who has indicated his/her understanding and acceptance.     Dental Advisory Given  Plan Discussed with: CRNA  Anesthesia Plan Comments:         Anesthesia Quick Evaluation

## 2019-11-20 NOTE — Transfer of Care (Signed)
Immediate Anesthesia Transfer of Care Note  Patient: Jessica [name]  Procedure(s) Performed: COLONOSCOPY WITH PROPOFOL (N/A )  Patient Location: PACU  Anesthesia Type:General  Level of Consciousness: awake and alert   Airway & Oxygen Therapy: Patient Spontanous Breathing  Post-op Assessment: Report given to RN and Post -op Vital signs reviewed and stable  Post vital signs: Reviewed and stable  Last Vitals:  Vitals Value Taken Time  BP 100/63 11/20/19 0955  Temp 36.6 C 11/20/19 0955  Pulse 103 11/20/19 0956  Resp 13 11/20/19 0955  SpO2 99 % 11/20/19 0956  Vitals shown include unvalidated device data.  Last Pain:  Vitals:   11/20/19 0848  TempSrc: Temporal  PainSc: 0-No pain         Complications: No complications documented.

## 2019-11-20 NOTE — Anesthesia Procedure Notes (Signed)
Date/Time: 11/20/2019 9:10 AM Performed by: Johnna Acosta, CRNA Pre-anesthesia Checklist: Patient identified, Emergency Drugs available, Suction available, Patient being monitored and Timeout performed Patient Re-evaluated:Patient Re-evaluated prior to induction Oxygen Delivery Method: Nasal cannula Preoxygenation: Pre-oxygenation with 100% oxygen Induction Type: IV induction

## 2019-11-23 ENCOUNTER — Other Ambulatory Visit (HOSPITAL_COMMUNITY)
Admission: RE | Admit: 2019-11-23 | Discharge: 2019-11-23 | Disposition: A | Discharge status: Home / Self Care | Payer: No Typology Code available for payment source | Source: Ambulatory Visit | Attending: Family Medicine | Admitting: Family Medicine

## 2019-11-23 ENCOUNTER — Encounter: Payer: Self-pay | Admitting: Gastroenterology

## 2019-11-23 ENCOUNTER — Other Ambulatory Visit: Payer: Self-pay

## 2019-11-23 ENCOUNTER — Ambulatory Visit (INDEPENDENT_AMBULATORY_CARE_PROVIDER_SITE_OTHER): Payer: No Typology Code available for payment source | Admitting: Family Medicine

## 2019-11-23 VITALS — BP 130/80 | HR 100 | Temp 97.5°F | Resp 16 | Ht 65.0 in | Wt 171.2 lb

## 2019-11-23 DIAGNOSIS — B351 Tinea unguium: Secondary | ICD-10-CM

## 2019-11-23 DIAGNOSIS — R87810 Cervical high risk human papillomavirus (HPV) DNA test positive: Secondary | ICD-10-CM | POA: Insufficient documentation

## 2019-11-23 DIAGNOSIS — E538 Deficiency of other specified B group vitamins: Secondary | ICD-10-CM | POA: Diagnosis not present

## 2019-11-23 DIAGNOSIS — R7989 Other specified abnormal findings of blood chemistry: Secondary | ICD-10-CM

## 2019-11-23 DIAGNOSIS — C819A Hodgkin lymphoma, unspecified, in remission: Secondary | ICD-10-CM

## 2019-11-23 DIAGNOSIS — E559 Vitamin D deficiency, unspecified: Secondary | ICD-10-CM | POA: Diagnosis not present

## 2019-11-23 DIAGNOSIS — Z1159 Encounter for screening for other viral diseases: Secondary | ICD-10-CM

## 2019-11-23 DIAGNOSIS — D229 Melanocytic nevi, unspecified: Secondary | ICD-10-CM

## 2019-11-23 DIAGNOSIS — Z79899 Other long term (current) drug therapy: Secondary | ICD-10-CM

## 2019-11-23 DIAGNOSIS — Z0000 Encounter for general adult medical examination without abnormal findings: Secondary | ICD-10-CM | POA: Diagnosis not present

## 2019-11-23 DIAGNOSIS — C8190 Hodgkin lymphoma, unspecified, unspecified site: Secondary | ICD-10-CM

## 2019-11-23 DIAGNOSIS — E038 Other specified hypothyroidism: Secondary | ICD-10-CM | POA: Diagnosis not present

## 2019-11-23 DIAGNOSIS — I700 Atherosclerosis of aorta: Secondary | ICD-10-CM

## 2019-11-23 DIAGNOSIS — Z131 Encounter for screening for diabetes mellitus: Secondary | ICD-10-CM

## 2019-11-23 LAB — SURGICAL PATHOLOGY

## 2019-11-23 MED ORDER — TERBINAFINE HCL 250 MG PO TABS: 250.0000 mg | ORAL_TABLET | Freq: Every day | ORAL | 0 refills | Status: DC

## 2019-11-23 NOTE — Patient Instructions (Signed)

## 2019-11-23 NOTE — Progress Notes (Signed)
referatpyName: Jessica [name]   MRN: [id number]    DOB: 19-Feb-1966   Date:11/23/2019       Progress Note  Subjective  Chief Complaint  Chief Complaint  Patient presents with   Annual Exam    HPI  Patient presents for annual CPE and fungal infection  Onychomycosis: going for about one year, using topical medication for the past two months without improvement, all toenails, no pain or itching, but very thick nails, bothering her. Discussed risk of medication, check liver enzymes first , rx sent to pharmacy but do not fill it until lft's back   Atypical mole: on the right forehead, growing in size and changing texture.   Diet: not balanced lately  Exercise:    USPSTF grade A and B recommendations    Office Visit from 10/21/2019 in [facility]  AUDIT-C Score 0     Depression: Phq 9 is  negative Depression screen [facility] 2/9 11/23/2019 10/21/2019 05/05/2019 02/02/2019 11/04/2018  Decreased Interest 0 0 1 0 0  Down, Depressed, Hopeless 0 0 0 0 3  PHQ - 2 Score 0 0 1 0 3  Altered sleeping 0 _0 -  Tired, decreased energy 0 _1 -  Change in appetite 0 0 0 0 -  Feeling bad or failure about yourself  0 0 0 0 -  Trouble concentrating 0 0 1 0 -  Moving slowly or fidgety/restless 0 1 1 0 -  Suicidal thoughts 0 0 0 0 -  PHQ-9 Score 0 _2 -  Difficult doing work/chores - Somewhat difficult Somewhat difficult Not difficult at all -  Some recent data might be hidden   Hypertension: BP Readings from Last 3 Encounters:  11/23/19 130/80  11/20/19 128/81  10/21/19 124/82   Obesity: Wt Readings from Last 3 Encounters:  11/23/19 171 lb 3.2 oz (77.7 kg)  11/20/19 165 lb (74.8 kg)  10/21/19 164 lb 6.4 oz (74.6 kg)   BMI Readings from Last 3 Encounters:  11/23/19 28.49 kg/m  11/20/19 26.63 kg/m  10/21/19 27.36 kg/m     Hep C Screening: Due today STD testing and prevention (HIV/chl/gon/syphilis): not interested  Intimate partner violence: None Sexual History  (Partners/Practices/Protection from [employer] hx STI/Pregnancy Plans):one sexual partner Pain during Intercourse:vaginal dryness causes pain but better with lube  Menstrual History/LMP/Abnormal Bleeding: s/p supra cervical hysterectomy  Incontinence Symptoms: no problems  Breast cancer:  - Last Mammogram: 02/2017  - BRCA gene screening: N/A  Osteoporosis: Discussed high calcium and vitamin D supplementation, weight bearing exercises  Cervical cancer screening: Due 04/08/2020  Skin cancer: Discussed monitoring for atypical lesions  Colorectal cancer: Repeat June 2024 Lung cancer:   Low Dose CT Chest recommended if Age 3-80 years, 30 pack-year currently smoking OR have quit w/in 15years. Patient does not qualify.   ECG: 2013   Advanced Care Planning: A voluntary discussion about advance care planning including the explanation and discussion of advance directives.  Discussed health care proxy and Living will, and the patient was able to identify a health care proxy as husband   Patient does not have a living will at present time.  Lipids: Lab Results  Component Value Date   CHOL 108 02/03/2019   CHOL 108 08/08/2017   CHOL 176 08/29/2016   Lab Results  Component Value Date   HDL 48 (L) 02/03/2019   HDL 44 08/08/2017   HDL 52 08/29/2016   Lab Results  Component Value Date  LDLCALC 42 02/03/2019   LDLCALC 48 08/08/2017   LDLCALC 104 (H) 08/29/2016   Lab Results  Component Value Date   TRIG 98 02/03/2019   TRIG 82 08/08/2017   TRIG 102 08/29/2016   Lab Results  Component Value Date   CHOLHDL 2.3 02/03/2019   CHOLHDL 2.5 08/08/2017   CHOLHDL 3.4 08/29/2016   No results found for: LDLDIRECT  Glucose: Glucose  Date Value Ref Range Status  03/03/2014 87 65 - 99 mg/dL Final  07/08/2013 103 (H) 65 - 99 mg/dL Final  12/03/2012 88 65 - 99 mg/dL Final   Glucose, Bld  Date Value Ref Range Status  02/03/2019 82 65 - 99 mg/dL Final    Comment:    .            Fasting  reference interval .   01/09/2018 82 70 - 99 mg/dL Final  02/18/2017 128 (H) 65 - 99 mg/dL Final    Patient Active Problem List   Diagnosis Date Noted   Iron deficiency anemia 01/09/2018   Cervical high risk HPV (human papillomavirus) test positive 04/08/2017   Abdominal pain, epigastric    Bariatric surgery status    Ulcer jejunum    Adenoma of left adrenal gland 02/18/2017   Abnormal TSH 08/30/2016   Bipolar disorder ([place]) 12/02/2015   HAV (hallux abducto valgus) 11/22/2015   Bunion of great toe 11/08/2015   Attention-deficit hyperactivity disorder, other type 10/28/2015   Sciatica 04/22/2015   B12 deficiency 12/01/2014   History of bariatric surgery 12/01/2014   Anxiety, generalized 12/01/2014   Gastro-esophageal reflux disease without esophagitis 12/01/2014   H/O elevated lipids 12/01/2014   H/O: HTN (hypertension) 12/01/2014   H/O: obesity 12/01/2014   Hodgkin's disease in remission ([place]) 12/01/2014   Current tobacco use 12/01/2014   Insomnia 03/24/2008   Abnormal serum level of alkaline phosphatase 11/08/2006   Chronic recurrent major depressive disorder ([place]) 11/08/2006    Past Surgical History:  Procedure Laterality Date   ABDOMINAL HYSTERECTOMY     BUNIONECTOMY Right    CARPAL TUNNEL RELEASE     COLONOSCOPY     multiple   COLONOSCOPY WITH PROPOFOL N/A 11/20/2019   Procedure: COLONOSCOPY WITH PROPOFOL;  Surgeon: Lin Landsman, MD;  Location: [facility] ENDOSCOPY;  Service: Gastroenterology;  Laterality: N/A;   ESOPHAGOGASTRODUODENOSCOPY (EGD) WITH PROPOFOL N/A 02/21/2017   Procedure: ESOPHAGOGASTRODUODENOSCOPY (EGD) WITH PROPOFOL;  Surgeon: Lucilla Lame, MD;  Location: [facility];  Service: Gastroenterology;  Laterality: N/A;  Pre-diabetic   excision of neck mass  09/1998   GASTRIC BYPASS  03/12/2011   LAPAROSCOPIC SUPRACERVICAL HYSTERECTOMY  2008   and unilateral salpingoophorectomy/ endometriosis-Dr Kincius    TONSILLECTOMY      Family History  Problem Relation Age of Onset   Cancer Mother 32       colon   Epilepsy Mother    Multiple myeloma Mother    Glaucoma Father    Breast cancer Maternal Grandmother 83       inflamitory breast ca    Social History   Socioeconomic History   Marital status: Married    Spouse name: Engineer, materials   Number of children: 2   Years of education: Not on file   Highest education level: Associate degree: occupational, Hotel manager, or vocational program  Occupational History   Occupation: house wife   Tobacco Use   Smoking status: Current Every Day Smoker    Packs/day: 0.50    Years: 16.00    Pack years: 8.00  Types: Cigarettes    Start date: 06/11/2000   Smokeless tobacco: Never Used  Vaping Use   Vaping Use: Some days  Substance and Sexual Activity   Alcohol use: No    Alcohol/week: 0.0 standard drinks    Comment: rarely   Drug use: No   Sexual activity: Yes    Partners: Male    Birth control/protection: Surgical  Other Topics Concern   Not on file  Social History Narrative   Not on file   Social Determinants of Health   Financial Resource Strain:    Difficulty of Paying Living Expenses:   Food Insecurity: No Food Insecurity   Worried About Running Out of Food in the Last Year: Never true   Ran Out of Food in the Last Year: Never true  Transportation Needs: No Transportation Needs   Lack of Transportation (Medical): No   Lack of Transportation (Non-Medical): No  Physical Activity: Insufficiently Active   Days of Exercise per Week: 3 days   Minutes of Exercise per Session: 20 min  Stress: Stress Concern Present   Feeling of Stress : Rather much  Social Connections: Socially Integrated   Frequency of Communication with Friends and Family: More than three times a week   Frequency of Social Gatherings with Friends and Family: More than three times a week   Attends Religious Services: More than 4 times per year    Active Member of [employer] or Organizations: Yes   Attends Music therapist: More than 4 times per year   Marital Status: Married  Human resources officer Violence: Not At Risk   Fear of Current or Ex-Partner: No   Emotionally Abused: No   Physically Abused: No   Sexually Abused: No     Current Outpatient Medications:    Ascorbic Acid (VITAMIN C) 1000 MG tablet, Take 1,000 mg by mouth daily., Disp: , Rfl:    atorvastatin (LIPITOR) 40 MG tablet, Take 1 tablet (40 mg total) by mouth daily., Disp: 90 tablet, Rfl: 1   BIOTIN PO, Take by mouth., Disp: , Rfl:    Cholecalciferol (VITAMIN D) 2000 units CAPS, Take 1 capsule (2,000 Units total) by mouth daily., Disp: 30 capsule, Rfl: 0   cyclobenzaprine (FLEXERIL) 5 MG tablet, TAKE 1 TABLET BY MOUTH AT BEDTIME AS NEEDED FOR MUSCLE SPASM, Disp: 90 tablet, Rfl: 1   DULoxetine (CYMBALTA) 60 MG capsule, Take 1 capsule (60 mg total) by mouth daily., Disp: 90 capsule, Rfl: 1   gabapentin (NEURONTIN) 100 MG capsule, Take 1 capsule (100 mg total) by mouth 2 (two) times daily., Disp: 180 capsule, Rfl: 1   hydrOXYzine (ATARAX/VISTARIL) 10 MG tablet, Take 1 tablet (10 mg total) by mouth 3 (three) times daily as needed., Disp: 90 tablet, Rfl: 0   levothyroxine (SYNTHROID) 25 MCG tablet, Take it daily, Disp: 90 tablet, Rfl: 1   Multiple Vitamins-Minerals (MULTI-VITAMIN GUMMIES) CHEW, Chew 2 capsules by mouth daily., Disp: 30 tablet, Rfl: 0   omeprazole (PRILOSEC) 40 MG capsule, TAKE 1 CAPSULE BY MOUTH EVERY DAY, Disp: 90 capsule, Rfl: 1   Probiotic Product (PROBIOTIC DAILY PO), Take by mouth., Disp: , Rfl:    QUEtiapine (SEROQUEL) 100 MG tablet, Take 1.5 tablets (150 mg total) by mouth at bedtime., Disp: 135 tablet, Rfl: 1   TRULICITY 1.5 YI/5.0YD SOPN, Inject 1.5 mg into the skin once a week., Disp: 12 pen, Rfl: 1  Allergies  Allergen Reactions   Vicodin [Hydrocodone-Acetaminophen] Other (See Comments)    Has really bad headaches/  migraine       ROS  Constitutional: Negative for fever or weight change.  Respiratory: Negative for cough and shortness of breath.   Cardiovascular: Negative for chest pain or palpitations.  Gastrointestinal: Negative for abdominal pain, no bowel changes.  Musculoskeletal: Negative for gait problem or joint swelling.  Skin: Negative for rash.  Neurological: Negative for dizziness or headache.  No other specific complaints in a complete review of systems (except as listed in HPI above).  Objective  Vitals:   11/23/19 1434  BP: 130/80  Pulse: 100  Resp: 16  Temp: (!) 97.5 F (36.4 C)  TempSrc: Temporal  SpO2: 96%  Weight: 171 lb 3.2 oz (77.7 kg)  Height: _0  (1.651 m)    Body mass index is 28.49 kg/m.  Physical Exam  Constitutional: Patient appears well-developed and well-nourished. No distress.  HENT: Head: Normocephalic and atraumatic. Ears: B TMs ok, no erythema or effusion; Nose: Not done . Mouth/Throat: not done  Eyes: Conjunctivae and EOM are normal. Pupils are equal, round, and reactive to light. No scleral icterus.   Neck: Normal range of motion. Neck supple. No JVD present. No thyromegaly present.  Cardiovascular: Normal rate, regular rhythm and normal heart sounds.  No murmur heard. No BLE edema. Pulmonary/Chest: Effort normal and breath sounds normal. No respiratory distress. Abdominal: Soft. Bowel sounds are normal, no distension. There is no tenderness. no masses Breast: no lumps or masses, no nipple discharge or rashes FEMALE GENITALIA:  External genitalia normal External urethra normal Vaginal vault normal without discharge or lesions Cervix normal without discharge or lesions Bimanual exam normal without masses RECTAL: not done  Musculoskeletal: Normal range of motion, no joint effusions. No gross deformities Neurological: he is alert and oriented to person, place, and time. No cranial nerve deficit. Coordination, balance, strength, speech and gait  are normal.  Skin: Skin is warm and dry. No rash noted. She has a growth on right forehead  Psychiatric: Patient has a normal mood and affect. behavior is normal. Judgment and thought content normal.   Recent Results (from the past 2160 hour(s))  SARS CORONAVIRUS 2 (TAT 6-24 HRS) Nasopharyngeal Nasopharyngeal Swab     Status: None   Collection Time: 11/18/19 11:54 AM   Specimen: Nasopharyngeal Swab  Result Value Ref Range   SARS Coronavirus 2 NEGATIVE NEGATIVE    Comment: (NOTE) SARS-CoV-2 target nucleic acids are NOT DETECTED. The SARS-CoV-2 RNA is generally detectable in upper and lower respiratory specimens during the acute phase of infection. Negative results do not preclude SARS-CoV-2 infection, do not rule out co-infections with other pathogens, and should not be used as the sole basis for treatment or other patient management decisions. Negative results must be combined with clinical observations, patient history, and epidemiological information. The expected result is Negative. Fact Sheet for Patients: SugarRoll.be Fact Sheet for Healthcare Providers: https://www.woods-mathews.com/ This test is not yet approved or cleared by the [place] FDA and  has been authorized for detection and/or diagnosis of SARS-CoV-2 by FDA under an Emergency Use Authorization (EUA). This EUA will remain  in effect (meaning this test can be used) for the duration of the COVID-19 declaration under Section 56 4(b)(1) of the Act, 21 U.S.C. section 360bbb-3(b)(1), unless the authorization is terminated or revoked sooner. Performed at [facility], [place] [address], [place], [place] [address]   Surgical pathology     Status: None   Collection Time: 11/20/19  9:26 AM  Result Value Ref Range   SURGICAL  PATHOLOGY      SURGICAL PATHOLOGY CASE: [id number] PATIENT: Jasmine December Surgical Pathology Report     Specimen Submitted: A. Colon polyp  x2, ascending; cold and hot snare B. Colon polyp, descending; cold snare  Clinical History: Screening colonoscopy.  Colon polyps.    DIAGNOSIS: A. COLON POLYPS X2, ASCENDING; HOT AND COLD SNARES: - MULTIPLE FRAGMENTS OF TUBULAR AND TUBULOVILLOUS ADENOMAS. - DYSPLASIA FOCALLY EXTENDS TO CAUTERIZED POLYP BASE FRAGMENTS. - NEGATIVE FOR HIGH-GRADE DYSPLASIA AND MALIGNANCY.  B. COLON POLYP, DESCENDING; COLD SNARE: - TUBULAR ADENOMA. - NEGATIVE FOR HIGH-GRADE DYSPLASIA AND MALIGNANCY.  GROSS DESCRIPTION: A. Labeled: Cold snare polyp x1 ascending colon, hot snare polyp x1 ascending colon Received: In formalin Tissue fragment(s): Multiple Size: 1.7 x 1 x 0.4 cm Description: Multiple fragments of pink-red polypoid tissue, probable base inked on larger pieces, sectioned Entirely submitted in 1 cassette.  B. Labeled: Cold snare polyp d escending colon Received: In formalin Tissue fragment(s): Multiple Size: 0.7 x 0.2 x 0.2 cm Description: Aggregate of tan polypoid fragments Entirely submitted in 1 cassette.   Final Diagnosis performed by Allena Napoleon, MD.   Electronically signed 11/23/2019 1:05:18PM The electronic signature indicates that the named Attending Pathologist has evaluated the specimen Technical component performed at [facility], [address] [place], [place] [address] Lab: [phone] Dir: Rush Farmer, MD, MMM  Professional component performed at [facility], [facility], [place], [place],  [address] Lab: [phone] Dir: Dellia Nims. Rubinas, MD       Fall Risk: Fall Risk  11/23/2019 10/21/2019 05/05/2019 02/02/2019 10/30/2018  Falls in the past year? 0 0 0 0 0  Number falls in past yr: 0 0 0 0 0  Injury with Fall? 0 0 0 0 0  Follow up - Falls evaluation completed - - -    Functional Status Survey: Is the patient deaf or have difficulty hearing?: No Does the patient have difficulty seeing, even when wearing glasses/contacts?: No Does  the patient have difficulty concentrating, remembering, or making decisions?: No Does the patient have difficulty walking or climbing stairs?: No Does the patient have difficulty dressing or bathing?: No Does the patient have difficulty doing errands alone such as visiting a doctor's office or shopping?: No   Assessment & Plan   1. Well adult exam   2. Cervical high risk HPV (human papillomavirus) test positive  - Cytology - PAP  3. Vitamin D deficiency  - VITAMIN D 25 Hydroxy (Vit-D Deficiency, Fractures)  4. Other specified hypothyroidism   5. B12 deficiency  - CBC with Differential/Platelet - Vitamin B12  6. Hodgkin's disease in remission ([place])  - CBC with Differential/Platelet  7. Long-term use of high-risk medication  - COMPLETE METABOLIC PANEL WITH GFR  8. Abnormal TSH  - TSH  9. Diabetes mellitus screening  - Hemoglobin A1c  10. Need for hepatitis C screening test  - Hepatitis C antibody  11. Atherosclerosis of abdominal aorta (HCC)  - Lipid panel  12. Onychomycosis of toenail  - terbinafine (LAMISIL) 250 MG tablet; Take 1 tablet (250 mg total) by mouth daily.  Dispense: 90 tablet; Refill: 0  13. Atypical mole  - Ambulatory referral to Dermatology  -USPSTF grade A and B recommendations reviewed with patient; age-appropriate recommendations, preventive care, screening tests, etc discussed and encouraged; healthy living encouraged; see AVS for patient education given to patient -Discussed importance of 150 minutes of physical activity weekly, eat two servings of fish weekly, eat one serving of tree nuts ( cashews, pistachios,  pecans, almonds.Marland Kitchen) every other day, eat 6 servings of fruit/vegetables daily and drink plenty of water and avoid sweet beverages.

## 2019-11-24 ENCOUNTER — Encounter: Payer: Self-pay | Admitting: Gastroenterology

## 2019-11-24 LAB — COMPLETE METABOLIC PANEL WITH GFR
AG Ratio: 1.3 (calc) (ref 1.0–2.5)
ALT: 15 U/L (ref 6–29)
AST: 17 U/L (ref 10–35)
Albumin: 3.8 g/dL (ref 3.6–5.1)
Alkaline phosphatase (APISO): 228 U/L — ABNORMAL HIGH (ref 37–153)
BUN: 9 mg/dL (ref 7–25)
CO2: 29 mmol/L (ref 20–32)
Calcium: 8.9 mg/dL (ref 8.6–10.4)
Chloride: 105 mmol/L (ref 98–110)
Creat: 0.58 mg/dL (ref 0.50–1.05)
GFR, Est African American: 123 mL/min/{1.73_m2} (ref >=60)
GFR, Est Non African American: 106 mL/min/{1.73_m2} (ref >=60)
Globulin: 3 g/dL (calc) (ref 1.9–3.7)
Glucose, Bld: 79 mg/dL (ref 65–99)
Potassium: 4.5 mmol/L (ref 3.5–5.3)
Sodium: 139 mmol/L (ref 135–146)
Total Bilirubin: 0.5 mg/dL (ref 0.2–1.2)
Total Protein: 6.8 g/dL (ref 6.1–8.1)

## 2019-11-24 LAB — LIPID PANEL
Cholesterol: 127 mg/dL (ref <200)
HDL: 54 mg/dL (ref >=50)
LDL Cholesterol (Calc): 54 mg/dL (calc)
Non-HDL Cholesterol (Calc): 73 mg/dL (calc) (ref <130)
Total CHOL/HDL Ratio: 2.4 (calc) (ref <5.0)
Triglycerides: 100 mg/dL (ref <150)

## 2019-11-24 LAB — VITAMIN B12: Vitamin B-12: 275 pg/mL (ref 200–1100)

## 2019-11-24 LAB — CBC WITH DIFFERENTIAL/PLATELET
Absolute Monocytes: 520 cells/uL (ref 200–950)
Basophils Absolute: 50 cells/uL (ref 0–200)
Basophils Relative: 0.5 %
Eosinophils Absolute: 380 cells/uL (ref 15–500)
Eosinophils Relative: 3.8 %
HCT: 35.4 % (ref 35.0–45.0)
Hemoglobin: 11.7 g/dL (ref 11.7–15.5)
Lymphs Abs: 4560 cells/uL — ABNORMAL HIGH (ref 850–3900)
MCH: 30.3 pg (ref 27.0–33.0)
MCHC: 33.1 g/dL (ref 32.0–36.0)
MCV: 91.7 fL (ref 80.0–100.0)
MPV: 9.9 fL (ref 7.5–12.5)
Monocytes Relative: 5.2 %
Neutro Abs: 4490 cells/uL (ref 1500–7800)
Neutrophils Relative %: 44.9 %
Platelets: 338 10*3/uL (ref 140–400)
RBC: 3.86 10*6/uL (ref 3.80–5.10)
RDW: 12.9 % (ref 11.0–15.0)
Total Lymphocyte: 45.6 %
WBC: 10 10*3/uL (ref 3.8–10.8)

## 2019-11-24 LAB — HEMOGLOBIN A1C
Hgb A1c MFr Bld: 5 % of total Hgb (ref <5.7)
Mean Plasma Glucose: 97 (calc)
eAG (mmol/L): 5.4 (calc)

## 2019-11-24 LAB — HEPATITIS C ANTIBODY
Hepatitis C Ab: NONREACTIVE
SIGNAL TO CUT-OFF: 0.01 (ref <1.00)

## 2019-11-24 LAB — TSH: TSH: 0.86 mIU/L

## 2019-11-24 LAB — VITAMIN D 25 HYDROXY (VIT D DEFICIENCY, FRACTURES): Vit D, 25-Hydroxy: 33 ng/mL (ref 30–100)

## 2019-11-26 LAB — CYTOLOGY - PAP
Comment: NEGATIVE
Diagnosis: UNDETERMINED — AB
High risk HPV: POSITIVE — AB

## 2019-12-16 ENCOUNTER — Ambulatory Visit
Admission: RE | Admit: 2019-12-16 | Discharge: 2019-12-16 | Disposition: A | Discharge status: Home / Self Care | Payer: No Typology Code available for payment source | Source: Ambulatory Visit | Attending: Family Medicine | Admitting: Family Medicine

## 2019-12-16 DIAGNOSIS — Z1231 Encounter for screening mammogram for malignant neoplasm of breast: Secondary | ICD-10-CM | POA: Diagnosis not present

## 2019-12-21 ENCOUNTER — Other Ambulatory Visit: Payer: Self-pay | Admitting: Family Medicine

## 2020-01-05 ENCOUNTER — Other Ambulatory Visit: Payer: Self-pay | Admitting: Family Medicine

## 2020-01-05 DIAGNOSIS — F419 Anxiety disorder, unspecified: Secondary | ICD-10-CM

## 2020-01-05 NOTE — Telephone Encounter (Signed)
Requested Prescriptions  Pending Prescriptions Disp Refills  . hydrOXYzine (ATARAX/VISTARIL) 10 MG tablet [Pharmacy Med Name: hydrOXYzine HCL 10 MG TABS 10 Tablet] 270 tablet 1    Sig: TAKE 1 TABLET BY MOUTH 3 TIMES DAILY AS NEEDED.     Ear, Nose, and Throat:  Antihistamines Passed - 01/05/2020  2:24 PM      Passed - Valid encounter within last 12 months    Recent Outpatient Visits          1 month ago Well adult exam   [facility] Steele Sizer, MD   2 months ago Atherosclerosis of abdominal aorta [facility])   [facility] Steele Sizer, MD   8 months ago Bipolar 1 disorder, mixed, moderate [facility])   [facility] [place], Drue Stager, MD   11 months ago Bipolar 1 disorder, mixed, moderate [facility])   [facility] Steele Sizer, MD   1 year ago Atherosclerosis of abdominal aorta [facility])   [facility] Steele Sizer, MD      Future Appointments            In 1 month [facility], [place], MD [place]   In 3 months Steele Sizer, MD [facility], [facility], [place]

## 2020-02-14 ENCOUNTER — Encounter: Payer: Self-pay | Admitting: Dermatology

## 2020-02-18 ENCOUNTER — Ambulatory Visit: Payer: No Typology Code available for payment source | Admitting: Dermatology

## 2020-02-25 ENCOUNTER — Other Ambulatory Visit: Payer: Self-pay | Admitting: Family Medicine

## 2020-02-25 DIAGNOSIS — B351 Tinea unguium: Secondary | ICD-10-CM

## 2020-03-07 ENCOUNTER — Other Ambulatory Visit: Payer: Self-pay | Admitting: Family Medicine

## 2020-03-07 DIAGNOSIS — B351 Tinea unguium: Secondary | ICD-10-CM

## 2020-03-08 ENCOUNTER — Other Ambulatory Visit: Payer: Self-pay

## 2020-04-03 ENCOUNTER — Encounter: Payer: Self-pay | Admitting: Family Medicine

## 2020-04-03 ENCOUNTER — Other Ambulatory Visit: Payer: Self-pay | Admitting: Family Medicine

## 2020-04-03 DIAGNOSIS — F411 Generalized anxiety disorder: Secondary | ICD-10-CM

## 2020-04-03 DIAGNOSIS — B351 Tinea unguium: Secondary | ICD-10-CM

## 2020-04-03 DIAGNOSIS — F3162 Bipolar disorder, current episode mixed, moderate: Secondary | ICD-10-CM

## 2020-04-04 ENCOUNTER — Other Ambulatory Visit: Payer: Self-pay | Admitting: Family Medicine

## 2020-04-04 DIAGNOSIS — F411 Generalized anxiety disorder: Secondary | ICD-10-CM

## 2020-04-04 DIAGNOSIS — F3162 Bipolar disorder, current episode mixed, moderate: Secondary | ICD-10-CM

## 2020-04-04 MED ORDER — GABAPENTIN 100 MG PO CAPS: 100.0000 mg | ORAL_CAPSULE | Freq: Two times a day (BID) | ORAL | 0 refills | Status: DC

## 2020-04-04 MED ORDER — QUETIAPINE FUMARATE 100 MG PO TABS: 150.0000 mg | ORAL_TABLET | Freq: Every day | ORAL | 0 refills | Status: DC

## 2020-04-04 NOTE — Telephone Encounter (Signed)
Requested medication (s) are due for refill today: yes  Requested medication (s) are on the active medication list: yes   Last refill:  01/05/2020  Future visit scheduled:yes   Notes to clinic: this refill cannot be delegated    Requested Prescriptions  Pending Prescriptions Disp Refills   QUEtiapine (SEROQUEL) 100 MG tablet [Pharmacy Med Name: QUETIAPINE FUMARATE 100 MG 100 Tablet] 135 tablet 1    Sig: TAKE 1 & 1/2 TABLETS (150 MG) BY MOUTH AT BEDTIME.      Not Delegated - Psychiatry:  Antipsychotics - Second Generation (Atypical) - quetiapine Failed - 04/04/2020  7:39 AM      Failed - This refill cannot be delegated      Passed - ALT in normal range and within 180 days    ALT  Date Value Ref Range Status  11/23/2019 15 6 - 29 U/L Final   SGPT (ALT)  Date Value Ref Range Status  03/03/2014 28 U/L Final    Comment:    14-63 NOTE: New Reference Range 12/29/13           Passed - AST in normal range and within 180 days    AST  Date Value Ref Range Status  11/23/2019 17 10 - 35 U/L Final   SGOT(AST)  Date Value Ref Range Status  03/03/2014 38 (H) 15 - 37 Unit/L Final          Passed - Completed PHQ-2 or PHQ-9 in the last 360 days.      Passed - Last BP in normal range    BP Readings from Last 1 Encounters:  11/23/19 130/80          Passed - Valid encounter within last 6 months    Recent Outpatient Visits           4 months ago Well adult exam   [facility] Steele Sizer, MD   5 months ago Atherosclerosis of abdominal aorta [facility])   [facility] Steele Sizer, MD   11 months ago Bipolar 1 disorder, mixed, moderate [facility])   [facility] Sizer, MD   1 year ago Bipolar 1 disorder, mixed, moderate [facility])   [facility] Steele Sizer, MD   1 year ago Atherosclerosis of abdominal aorta [facility])   [facility] Steele Sizer, MD       Future  Appointments             In 3 weeks Steele Sizer, MD [facility], [place]   In 1 month [place], [place], MD [place]             Signed Prescriptions Disp Refills   gabapentin (NEURONTIN) 100 MG capsule 180 capsule 0    Sig: TAKE 1 CAPSULE BY MOUTH TWICE DAILY      Neurology: Anticonvulsants - gabapentin Passed - 04/04/2020  7:39 AM      Passed - Valid encounter within last 12 months    Recent Outpatient Visits           4 months ago Well adult exam   [facility] Steele Sizer, MD   5 months ago Atherosclerosis of abdominal aorta [facility])   [facility] Steele Sizer, MD   11 months ago Bipolar 1 disorder, mixed, moderate [facility])   [facility] Steele Sizer, MD   1 year ago Bipolar 1 disorder, mixed, moderate ([place])   [facility]izer, MD   1 year ago Atherosclerosis of abdominal aorta [facility])   [facility] Steele Sizer, MD       Future Appointments             In 3 weeks Ancil Boozer, Drue Stager, MD [facility], [place]   In 1 month [facility], [place], MD [place]

## 2020-04-05 ENCOUNTER — Other Ambulatory Visit: Payer: Self-pay | Admitting: Family Medicine

## 2020-04-05 DIAGNOSIS — F3162 Bipolar disorder, current episode mixed, moderate: Secondary | ICD-10-CM

## 2020-04-05 DIAGNOSIS — F411 Generalized anxiety disorder: Secondary | ICD-10-CM

## 2020-04-25 ENCOUNTER — Ambulatory Visit: Payer: No Typology Code available for payment source | Admitting: Family Medicine

## 2020-05-04 ENCOUNTER — Ambulatory Visit: Payer: No Typology Code available for payment source | Admitting: Dermatology

## 2020-05-04 ENCOUNTER — Encounter: Payer: Self-pay | Admitting: Dermatology

## 2020-05-04 ENCOUNTER — Other Ambulatory Visit: Payer: Self-pay

## 2020-05-04 DIAGNOSIS — D485 Neoplasm of uncertain behavior of skin: Secondary | ICD-10-CM | POA: Diagnosis not present

## 2020-05-04 DIAGNOSIS — D492 Neoplasm of unspecified behavior of bone, soft tissue, and skin: Secondary | ICD-10-CM

## 2020-05-04 DIAGNOSIS — Z1283 Encounter for screening for malignant neoplasm of skin: Secondary | ICD-10-CM | POA: Diagnosis not present

## 2020-05-04 DIAGNOSIS — D229 Melanocytic nevi, unspecified: Secondary | ICD-10-CM | POA: Diagnosis not present

## 2020-05-04 DIAGNOSIS — D489 Neoplasm of uncertain behavior, unspecified: Secondary | ICD-10-CM

## 2020-05-04 DIAGNOSIS — D1800 Hemangioma unspecified site: Secondary | ICD-10-CM

## 2020-05-04 DIAGNOSIS — L814 Other melanin hyperpigmentation: Secondary | ICD-10-CM | POA: Diagnosis not present

## 2020-05-04 DIAGNOSIS — D225 Melanocytic nevi of trunk: Secondary | ICD-10-CM | POA: Diagnosis not present

## 2020-05-04 DIAGNOSIS — D2271 Melanocytic nevi of right lower limb, including hip: Secondary | ICD-10-CM

## 2020-05-04 DIAGNOSIS — L578 Other skin changes due to chronic exposure to nonionizing radiation: Secondary | ICD-10-CM

## 2020-05-04 DIAGNOSIS — Z86018 Personal history of other benign neoplasm: Secondary | ICD-10-CM

## 2020-05-04 DIAGNOSIS — L821 Other seborrheic keratosis: Secondary | ICD-10-CM

## 2020-05-04 DIAGNOSIS — L7211 Pilar cyst: Secondary | ICD-10-CM | POA: Diagnosis not present

## 2020-05-04 HISTORY — DX: Personal history of other benign neoplasm: Z86.018

## 2020-05-04 NOTE — Patient Instructions (Addendum)
Melanoma ABCDEs  Melanoma is the most dangerous type of skin cancer, and is the leading cause of death from skin disease.  You are more likely to develop melanoma if you:  Have light-colored skin, light-colored eyes, or red or blond hair  Spend a lot of time in the sun  Tan regularly, either outdoors or in a tanning bed  Have had blistering sunburns, especially during childhood  Have a close family member who has had a melanoma  Have atypical moles or large birthmarks  Early detection of melanoma is key since treatment is typically straightforward and cure rates are extremely high if we catch it early.   The first sign of melanoma is often a change in a mole or a new dark spot.  The ABCDE system is a way of remembering the signs of melanoma.  A for asymmetry:  The two halves do not match. B for border:  The edges of the growth are irregular. C for color:  A mixture of colors are present instead of an even brown color. D for diameter:  Melanomas are usually (but not always) greater than 24mm - the size of a pencil eraser. E for evolution:  The spot keeps changing in size, shape, and color.  Please check your skin once per month between visits. You can use a small mirror in front and a large mirror behind you to keep an eye on the back side or your body.   If you see any new or changing lesions before your next follow-up, please call to schedule a visit.  Please continue daily skin protection including broad spectrum sunscreen SPF 30+ to sun-exposed areas, reapplying every 2 hours as needed when you're outdoors.     Pre-Operative Instructions  You are scheduled for a surgical procedure at [facility] We recommend you read the following instructions. If you have any questions or concerns, please call the office at [phone].  1. Shower and wash the entire body with soap and water the day of your surgery paying special attention to cleansing at and around the planned  surgery site.  2. Avoid aspirin or aspirin containing products at least fourteen (14) days prior to your surgical procedure and for at least one week (7 Days) after your surgical procedure. If you take aspirin on a regular basis for heart disease or history of stroke or for any other reason, we may recommend you continue taking aspirin but please notify us if you take this on a regular basis. Aspirin can cause more bleeding to occur during surgery as well as prolonged bleeding and bruising after surgery.   3. Avoid other nonsteroidal pain medications at least one week prior to surgery and at least one week prior to your surgery. These include medications such as Ibuprofen (Motrin, Advil and Nuprin), Naprosyn, Voltaren, Relafen, etc. If medications are used for therapeutic reasons, please inform us as they can cause increased bleeding or prolonged bleeding during and bruising after surgical procedures.   4. Please advise [place] if you are taking any "blood thinner" medications such as Coumadin or Dipyridamole or Plavix or similar medications. These cause increased bleeding and prolonged bleeding during procedures and bruising after surgical procedures. We may have to consider discontinuing these medications briefly prior to and shortly after your surgery if safe to do so.   5. Please inform us of all medications you are currently taking. All medications that are taken regularly should be taken the day of surgery as you always do.  Nevertheless, we need to be informed of what medications you are taking prior to surgery to know whether they will affect the procedure or cause any complications.   6. Please inform us of any medication allergies. Also inform us of whether you have allergies to Latex or rubber products or whether you have had any adverse reaction to Lidocaine or Epinephrine.  7. Please inform us of any prosthetic or artificial body parts such as artificial heart valve, joint replacements, etc., or  similar condition that might require preoperative antibiotics.   8. We recommend avoidance of alcohol at least two weeks prior to surgery and continued avoidance for at least two weeks after surgery.   9. We recommend discontinuation of tobacco smoking at least two weeks prior to surgery and continued abstinence for at least two weeks after surgery.  10. Do not plan strenuous exercise, strenuous work or strenuous lifting for approximately four weeks after your surgery.   11. We request if you are unable to make your scheduled surgical appointment, please call us at least a week in advance or as soon as you are aware of a problem so that we can cancel or reschedule the appointment.   12. You MAY TAKE TYLENOL (acetaminophen) for pain as it is not a blood thinner.   13. PLEASE PLAN TO BE IN TOWN FOR TWO WEEKS FOLLOWING SURGERY, THIS IS IMPORTANT SO YOU CAN BE CHECKED FOR DRESSING CHANGES, SUTURE REMOVAL AND TO MONITOR FOR POSSIBLE COMPLICATIONS.   Cryotherapy Aftercare  . Wash gently with soap and water everyday.   Marland Kitchen Apply Vaseline and Band-Aid daily until healed.  Prior to procedure, discussed risks of blister formation, small wound, skin dyspigmentation, or rare scar following cryotherapy.   Wound Care Instructions  1. Cleanse wound gently with soap and water once a day then pat dry with clean gauze. Apply a thing coat of Petrolatum (petroleum jelly, "Vaseline") over the wound (unless you have an allergy to this). We recommend that you use a new, sterile tube of Vaseline. Do not pick or remove scabs. Do not remove the yellow or white "healing tissue" from the base of the wound.  2. Cover the wound with fresh, clean, nonstick gauze and secure with paper tape. You may use Band-Aids in place of gauze and tape if the would is small enough, but would recommend trimming much of the tape off as there is often too much. Sometimes Band-Aids can irritate the skin.  3. You should call the office for  your biopsy report after 1 week if you have not already been contacted.  4. If you experience any problems, such as abnormal amounts of bleeding, swelling, significant bruising, significant pain, or evidence of infection, please call the office immediately.  5. FOR ADULT SURGERY PATIENTS: If you need something for pain relief you may take 1 extra strength Tylenol (acetaminophen) AND 2 Ibuprofen (200mg  each) together every 4 hours as needed for pain. (do not take these if you are allergic to them or if you have a reason you should not take them.) Typically, you may only need pain medication for 1 to 3 days.

## 2020-05-04 NOTE — Progress Notes (Signed)
New Patient Visit  Subjective  Jessica [name] is a 54 y.o. female who presents for the following: Annual Exam (PCP recommended having moles checked.) and Cyst (Scalp. Dur: years. Growing, bothersome.).  Patient here for full body skin exam and skin cancer screening.  She reports irritated spots at her leg and her forehead.  Objective  Well appearing patient in no apparent distress; mood and affect are within normal limits.  Review of Systems: No other skin or systemic complaints except as noted in HPI or Assessment and Plan.  A full examination was performed including scalp, head, eyes, ears, nose, lips, neck, chest, axillae, abdomen, back, buttocks, bilateral upper extremities, bilateral lower extremities, hands, feet, fingers, toes, fingernails, and toenails. All findings within normal limits unless otherwise noted below.  Objective  Right Parietal Scalp: 1.2cm Subcutaneous nodule at scalp.   Objective  Left Medial Thigh: 0.3cm erytematous tan papule   Objective  Right Abdomen (side) - Upper: 0.3cm thin dark brown papule  Objective  Left medial Cheek: 0.2cm pink papule R/O BCC  Objective  Right Knee - medial: 0.2cm dark brown thin papule  Objective  Right frontal hairline x1: 0.5cm Erythematous keratotic or waxy stuck-on papule or plaque.   Assessment & Plan    Lentigines - Scattered tan macules - Discussed due to sun exposure - Benign, observe - Call for any changes  Seborrheic Keratoses - Stuck-on, waxy, tan-brown papules and plaques  - Discussed benign etiology and prognosis. - Observe - Call for any changes  Melanocytic Nevi - Tan-brown and/or pink-flesh-colored symmetric macules and papules - Benign appearing on exam today - Observation - Call clinic for new or changing moles - Recommend daily use of broad spectrum spf 30+ sunscreen to sun-exposed areas.   Hemangiomas - Red papules - Discussed benign nature - Observe - Call for any  changes  Actinic Damage - Chronic, secondary to cumulative UV/sun exposure - diffuse scaly erythematous macules with underlying dyspigmentation - Recommend daily broad spectrum sunscreen SPF 30+ to sun-exposed areas, reapply every 2 hours as needed.  - Call for new or changing lesions.  Skin cancer screening performed today.  Pilar cyst Right Parietal Scalp  Benign-appearing.  Observation.  Call clinic for new or changing lesions.  Recommend daily use of broad spectrum spf 30+ sunscreen to sun-exposed areas.    Return for surgical excision.  Neoplasm of skin (3) Left Medial Thigh  Epidermal / dermal shaving  Lesion diameter (cm):  0.3 Informed consent: discussed and consent obtained   Patient was prepped and draped in usual sterile fashion: Area prepped with alcohol. Anesthesia: the lesion was anesthetized in a standard fashion   Anesthetic:  1% lidocaine w/ epinephrine 1-100,000 buffered w/ 8.4% NaHCO3 Instrument used: flexible razor blade   Hemostasis achieved with: pressure, aluminum chloride and electrodesiccation   Outcome: patient tolerated procedure well   Post-procedure details: wound care instructions given   Post-procedure details comment:  Ointment and small bandage applied  Specimen 1 - Surgical pathology Differential Diagnosis: R/O irritated nevus vs other  Check Margins: No 0.3cm erytematous tan papule  Right Abdomen (side) - Upper  Epidermal / dermal shaving  Lesion diameter (cm):  0.3 Informed consent: discussed and consent obtained   Patient was prepped and draped in usual sterile fashion: Area prepped with alcohol. Anesthesia: the lesion was anesthetized in a standard fashion   Anesthetic:  1% lidocaine w/ epinephrine 1-100,000 buffered w/ 8.4% NaHCO3 Instrument used: flexible razor blade   Hemostasis achieved with: pressure,  aluminum chloride and electrodesiccation   Outcome: patient tolerated procedure well   Post-procedure details: wound care  instructions given   Post-procedure details comment:  Ointment and small bandage applied  Specimen 2 - Surgical pathology Differential Diagnosis: R/O atypia Check Margins: No R/O atypia 0.3cm thin dark brown papule  Left medial Cheek  Skin / nail biopsy Type of biopsy: tangential   Informed consent: discussed and consent obtained   Anesthesia: the lesion was anesthetized in a standard fashion   Anesthesia comment:  Area prepped with alcohol Anesthetic:  1% lidocaine w/ epinephrine 1-100,000 buffered w/ 8.4% NaHCO3 Instrument used: flexible razor blade   Hemostasis achieved with: pressure, aluminum chloride and electrodesiccation   Outcome: patient tolerated procedure well   Post-procedure details: wound care instructions given   Post-procedure details comment:  Ointment and small bandage applied  Specimen 3 - Surgical pathology Differential Diagnosis: R/O BCC Check Margins: No 0.2cm pink papule   Nevus Right Knee - medial  Recheck at follow-up. Call if any changes occur before scheduled follow-up.    Neoplasm of uncertain behavior Right frontal hairline x1  Skin / nail biopsy Type of biopsy: tangential   Informed consent: discussed and consent obtained   Anesthesia: the lesion was anesthetized in a standard fashion   Anesthesia comment:  Area prepped with alcohol Anesthetic:  1% lidocaine w/ epinephrine 1-100,000 buffered w/ 8.4% NaHCO3 Instrument used: flexible razor blade   Hemostasis achieved with: pressure, aluminum chloride and electrodesiccation   Outcome: patient tolerated procedure well   Post-procedure details: wound care instructions given   Post-procedure details comment:  Ointment and small bandage applied  Specimen 4 - Surgical pathology Differential Diagnosis: R/O ISK Check Margins: No 0.5cm Erythematous keratotic or waxy stuck-on papule or plaque.  Return if symptoms worsen or fail to improve.   I, Emelia Salisbury, CMA, am acting as scribe for Forest Gleason, MD.   Documentation: I have reviewed the above documentation for accuracy and completeness, and I agree with the above.  Forest Gleason, MD

## 2020-05-09 ENCOUNTER — Encounter: Payer: Self-pay | Admitting: Dermatology

## 2020-05-09 NOTE — Progress Notes (Signed)
Name: Jessica [name]   MRN: [id number]    DOB: 04/26/1966   Date:05/10/2020       Progress Note  Subjective  Chief Complaint  Follow up  HPI  History of obesity: she had bariatric surgery 03/2011, maximum weight prior to surgery was 270 lbs, she went down to 142 lbs and was stable for many years, however when she was  diagnosed with bipolarin 2017and was placedon antipsychotic medicationsshe started to gain weight, March 2018 weight was 183 lbs. She was onLevothyroxine and Metformin andwent down to 165 lbs in May 2018 and we started her on Trulicity to help curb appetite and help with pre-diabetes/insulin resistanceand shewasbelow her goal weight ( it was 140 lbs), and eventually down  to 129 lbs, but she was having  nausea and epigastric pain, she finally saw Dr. Cathie Hoops, came off Nsaid's and was given carafate, omeprazolefor treatment for jejunal ulcer. She felt better with medication and gained some weight. Her weight at home on 02/10/2020 was 168 lbs she joined weight watchers with her sister, cut down on sweets and carbs and was doing great, however over the past month she noticed epigastric pain, continuous weight loss and is worried that the ulcer is back. She has been unable to eat because it causes an dull epigastric pain, last night she was only able to eat a bite of mash potatoes and one bite of pinto beans. She resumed carafate but explained she needs to follow up with GI for repeat scope   Pre-diabetes:hgbA1C was back in 2013 was 6.3%, but down to 5.3 % with weight loss , she was gradually gaining weight after bariatric surgery( 2012 )and we started her on Metformin 08/2016 and also Synthroid forelevatedTSH .  She has been off Metformin since end of 2018 to switch to Trulicity. She is no longer having sweets but worried about her weight loss, down to 136 lbs , advised to hold Trulicity for now until evaluated by GI   B12 deficiency:not currently getting B12 injections but  is getting SL B12 at [facility], last level was low even on otc supplementation and we will give her a b12 injection today   Adult hypothyroidism:: associated with fatigue, and mild weight gain, started on Levothyroxine Fall 2020 and at goal . She has noticed some hair loss, but it may be from the sudden weight loss.   Hodgkin's lymphoma:she went back to see Dr. Cathie Hoops last year,last CBC did not show anemia, white count was normal 01/2019 , except for some elevation of lymphocytes and eosinophils.We will recheck levels today , may need to go back to see hematologist She denies lymphadenopathy   Bipolar:no longer rseeingDr.Eappenbecause of cost,last visit was 01/2019,she has insurance again, but prefers not going back yet, she has been more anxious lately, worried about her mother, recently diagnosed with Multiple Myeloma, not sleeping well. She continues to refuse referral to Dr. Elna Breslow, also discussed therapy   Atherosclerosis of aorta: she is on Atorvastatin and denies side effects    Patient Active Problem List   Diagnosis Date Noted  . Iron deficiency anemia 01/09/2018  . Cervical high risk HPV (human papillomavirus) test positive 04/08/2017  . Abdominal pain, epigastric   . Bariatric surgery status   . Ulcer jejunum   . Adenoma of left adrenal gland 02/18/2017  . Abnormal TSH 08/30/2016  . Bipolar disorder (HCC) 12/02/2015  . HAV (hallux abducto valgus) 11/22/2015  . Bunion of great toe 11/08/2015  . Attention-deficit hyperactivity disorder, other type  10/28/2015  . Sciatica 04/22/2015  . B12 deficiency 12/01/2014  . History of bariatric surgery 12/01/2014  . Anxiety, generalized 12/01/2014  . Gastro-esophageal reflux disease without esophagitis 12/01/2014  . H/O elevated lipids 12/01/2014  . H/O: HTN (hypertension) 12/01/2014  . H/O: obesity 12/01/2014  . Hodgkin's disease in remission ([place]) 12/01/2014  . Current tobacco use 12/01/2014  . Insomnia 03/24/2008  .  Abnormal serum level of alkaline phosphatase 11/08/2006  . Chronic recurrent major depressive disorder ([place]) 11/08/2006    Past Surgical History:  Procedure Laterality Date  . ABDOMINAL HYSTERECTOMY    . BUNIONECTOMY Right   . CARPAL TUNNEL RELEASE    . COLONOSCOPY     multiple  . COLONOSCOPY WITH PROPOFOL N/A 11/20/2019   Procedure: COLONOSCOPY WITH PROPOFOL;  Surgeon: Lin Landsman, MD;  Location: [facility] ENDOSCOPY;  Service: Gastroenterology;  Laterality: N/A;  . ESOPHAGOGASTRODUODENOSCOPY (EGD) WITH PROPOFOL N/A 02/21/2017   Procedure: ESOPHAGOGASTRODUODENOSCOPY (EGD) WITH PROPOFOL;  Surgeon: Lucilla Lame, MD;  Location: [place];  Service: Gastroenterology;  Laterality: N/A;  Pre-diabetic  . excision of neck mass  09/1998  . GASTRIC BYPASS  03/12/2011  . LAPAROSCOPIC SUPRACERVICAL HYSTERECTOMY  2008   and unilateral salpingoophorectomy/ endometriosis-Dr Kincius  . TONSILLECTOMY      Family History  Problem Relation Age of Onset  . Cancer Mother 66       colon  . Epilepsy Mother   . Multiple myeloma Mother   . Glaucoma Father   . Breast cancer Maternal Grandmother 68       inflamitory breast ca    Social History   Tobacco Use  . Smoking status: Current Every Day Smoker    Packs/day: 0.50    Years: 16.00    Pack years: 8.00    Types: Cigarettes    Start date: 06/11/2000  . Smokeless tobacco: Never Used  Substance Use Topics  . Alcohol use: No    Alcohol/week: 0.0 standard drinks    Comment: rarely     Current Outpatient Medications:  .  Ascorbic Acid (VITAMIN C) 1000 MG tablet, Take 1,000 mg by mouth daily., Disp: , Rfl:  .  BIOTIN PO, Take by mouth., Disp: , Rfl:  .  Cholecalciferol (VITAMIN D) 2000 units CAPS, Take 1 capsule (2,000 Units total) by mouth daily., Disp: 30 capsule, Rfl: 0 .  hydrOXYzine (ATARAX/VISTARIL) 10 MG tablet, TAKE 1 TABLET BY MOUTH 3 TIMES DAILY AS NEEDED., Disp: 270 tablet, Rfl: 1 .  levothyroxine (SYNTHROID) 25 MCG tablet,  Take it daily, Disp: 90 tablet, Rfl: 1 .  Multiple Vitamins-Minerals (MULTI-VITAMIN GUMMIES) CHEW, Chew 2 capsules by mouth daily., Disp: 30 tablet, Rfl: 0 .  omeprazole (PRILOSEC) 40 MG capsule, TAKE 1 CAPSULE BY MOUTH EVERY DAY, Disp: 90 capsule, Rfl: 1 .  Probiotic Product (PROBIOTIC DAILY PO), Take by mouth., Disp: , Rfl:  .  TRULICITY 1.5 PY/1.9JK SOPN, Inject 1.5 mg into the skin once a week., Disp: 12 pen, Rfl: 1 .  atorvastatin (LIPITOR) 40 MG tablet, Take 1 tablet (40 mg total) by mouth daily., Disp: 90 tablet, Rfl: 1 .  cyclobenzaprine (FLEXERIL) 5 MG tablet, TAKE 1 TABLET BY MOUTH AT BEDTIME AS NEEDED FOR MUSCLE SPASM, Disp: 90 tablet, Rfl: 1 .  DULoxetine (CYMBALTA) 60 MG capsule, Take 1 capsule (60 mg total) by mouth daily., Disp: 90 capsule, Rfl: 1 .  gabapentin (NEURONTIN) 100 MG capsule, Take 2 capsules (200 mg total) by mouth at bedtime., Disp: 180 capsule, Rfl: 1 .  QUEtiapine (SEROQUEL) 200 MG tablet, Take 1 tablet (200 mg total) by mouth at bedtime., Disp: 90 tablet, Rfl: 1  Current Facility-Administered Medications:  .  cyanocobalamin ((VITAMIN B-12)) injection 1,000 mcg, 1,000 mcg, Intramuscular, Once, Steele Sizer, MD  Allergies  Allergen Reactions  . Vicodin [Hydrocodone-Acetaminophen] Other (See Comments)    Has really bad headaches/ migraine      I personally reviewed active problem list, medication list, allergies, family history, social history, health maintenance, notes from last encounter with the patient/caregiver today.   ROS  Constitutional: Negative for fever, positive for  weight change.  Respiratory: Negative for cough and shortness of breath.   Cardiovascular: Negative for chest pain or palpitations.  Gastrointestinal: positive  for abdominal pain but no  bowel changes.  Musculoskeletal: negative  for gait problem or joint swelling.  Skin: Negative for rash.  Neurological: Negative for dizziness or headache.  No other specific complaints in a  complete review of systems (except as listed in HPI above).  Objective  Vitals:   05/10/20 1346  BP: 136/88  Pulse: (!) 108  Resp: 18  Temp: 98.7 F (37.1 C)  TempSrc: Oral  SpO2: 94%  Weight: 136 lb 3.2 oz (61.8 kg)  Height: $Remove'5\' 5"'bKSIFVC$  (1.651 m)    Body mass index is 22.66 kg/m.  Physical Exam  Constitutional: Patient appears well-developed and malnourished, with temporal waisting and sunken eyes  No distress.  HEENT: head atraumatic, normocephalic, pupils equal and reactive to light,  neck supple Cardiovascular: Normal rate, regular rhythm and normal heart sounds.  No murmur heard. No BLE edema. Pulmonary/Chest: Effort normal and breath sounds normal. No respiratory distress. Abdominal: Soft.  There is positive for epigastric  tenderness. Psychiatric: Patient has a normal mood and affect. behavior is normal. Judgment and thought content normal.  PHQ2/9: Depression screen [facility] 2/9 05/10/2020 11/23/2019 10/21/2019 05/05/2019 02/02/2019  Decreased Interest 1 0 0 1 0  Down, Depressed, Hopeless 1 0 0 0 0  PHQ - 2 Score 2 0 0 1 0  Altered sleeping 3 0 $R'3 3 3  'wI$ Tired, decreased energy 2 0 $R'2 3 1  'bD$ Change in appetite 3 0 0 0 0  Feeling bad or failure about yourself  0 0 0 0 0  Trouble concentrating 0 0 0 1 0  Moving slowly or fidgety/restless 0 0 1 1 0  Suicidal thoughts 0 0 0 0 0  PHQ-9 Score 10 0 $Re'6 9 4  'EYu$ Difficult doing work/chores - - Somewhat difficult Somewhat difficult Not difficult at all  Some recent data might be hidden    phq 9 is positive   Fall Risk: Fall Risk  05/10/2020 11/23/2019 10/21/2019 05/05/2019 02/02/2019  Falls in the past year? 0 0 0 0 0  Number falls in past yr: 0 0 0 0 0  Injury with Fall? 0 0 0 0 0  Follow up - - Falls evaluation completed - -     Functional Status Survey: Is the patient deaf or have difficulty hearing?: No Does the patient have difficulty seeing, even when wearing glasses/contacts?: No Does the patient have difficulty concentrating,  remembering, or making decisions?: No Does the patient have difficulty walking or climbing stairs?: No Does the patient have difficulty dressing or bathing?: No Does the patient have difficulty doing errands alone such as visiting a doctor's office or shopping?: No    Assessment & Plan  1. Bipolar 1 disorder, mixed, moderate (HCC)  - DULoxetine (CYMBALTA) 60 MG capsule; Take 1 capsule (  60 mg total) by mouth daily.  Dispense: 90 capsule; Refill: 1 - gabapentin (NEURONTIN) 100 MG capsule; Take 2 capsules (200 mg total) by mouth at bedtime.  Dispense: 180 capsule; Refill: 1 - QUEtiapine (SEROQUEL) 200 MG tablet; Take 1 tablet (200 mg total) by mouth at bedtime.  Dispense: 90 tablet; Refill: 1  2. GAD (generalized anxiety disorder)  - DULoxetine (CYMBALTA) 60 MG capsule; Take 1 capsule (60 mg total) by mouth daily.  Dispense: 90 capsule; Refill: 1 - gabapentin (NEURONTIN) 100 MG capsule; Take 2 capsules (200 mg total) by mouth at bedtime.  Dispense: 180 capsule; Refill: 1 - QUEtiapine (SEROQUEL) 200 MG tablet; Take 1 tablet (200 mg total) by mouth at bedtime.  Dispense: 90 tablet; Refill: 1  3. Vitamin D deficiency   4. Hodgkin's disease in remission [facility])  May need to see Dr. Tasia Catchings   5. B12 deficiency  - cyanocobalamin ((VITAMIN B-12)) injection 1,000 mcg  6. Abnormal TSH   7. Atherosclerosis of abdominal aorta (HCC)  - atorvastatin (LIPITOR) 40 MG tablet; Take 1 tablet (40 mg total) by mouth daily.  Dispense: 90 tablet; Refill: 1  8. Gastro-esophageal reflux disease without esophagitis   9. Recurrent low back pain  - cyclobenzaprine (FLEXERIL) 5 MG tablet; TAKE 1 TABLET BY MOUTH AT BEDTIME AS NEEDED FOR MUSCLE SPASM  Dispense: 90 tablet; Refill: 1  10. Other specified hypothyroidism   11. Insulin resistance   12. Onychomycosis of toenail   13. History of peptic ulcer  - CBC with Differential/Platelet - Iron, TIBC and Ferritin Panel  14. Abnormal weight loss  -  TSH - Ambulatory referral to Gastroenterology  15. History of iron deficiency anemia  - CBC with Differential/Platelet - Iron, TIBC and Ferritin Panel - Ambulatory referral to Gastroenterology

## 2020-05-10 ENCOUNTER — Encounter: Payer: Self-pay | Admitting: Family Medicine

## 2020-05-10 ENCOUNTER — Ambulatory Visit (INDEPENDENT_AMBULATORY_CARE_PROVIDER_SITE_OTHER): Payer: No Typology Code available for payment source | Admitting: Family Medicine

## 2020-05-10 ENCOUNTER — Other Ambulatory Visit: Payer: Self-pay | Admitting: Family Medicine

## 2020-05-10 ENCOUNTER — Other Ambulatory Visit: Payer: Self-pay

## 2020-05-10 VITALS — BP 136/88 | HR 108 | Temp 98.7°F | Resp 18 | Ht 65.0 in | Wt 136.2 lb

## 2020-05-10 DIAGNOSIS — E038 Other specified hypothyroidism: Secondary | ICD-10-CM

## 2020-05-10 DIAGNOSIS — E8881 Metabolic syndrome: Secondary | ICD-10-CM

## 2020-05-10 DIAGNOSIS — R634 Abnormal weight loss: Secondary | ICD-10-CM

## 2020-05-10 DIAGNOSIS — C8190 Hodgkin lymphoma, unspecified, unspecified site: Secondary | ICD-10-CM

## 2020-05-10 DIAGNOSIS — F3162 Bipolar disorder, current episode mixed, moderate: Secondary | ICD-10-CM

## 2020-05-10 DIAGNOSIS — C819A Hodgkin lymphoma, unspecified, in remission: Secondary | ICD-10-CM

## 2020-05-10 DIAGNOSIS — E559 Vitamin D deficiency, unspecified: Secondary | ICD-10-CM | POA: Diagnosis not present

## 2020-05-10 DIAGNOSIS — E538 Deficiency of other specified B group vitamins: Secondary | ICD-10-CM

## 2020-05-10 DIAGNOSIS — F411 Generalized anxiety disorder: Secondary | ICD-10-CM

## 2020-05-10 DIAGNOSIS — Z862 Personal history of diseases of the blood and blood-forming organs and certain disorders involving the immune mechanism: Secondary | ICD-10-CM

## 2020-05-10 DIAGNOSIS — I700 Atherosclerosis of aorta: Secondary | ICD-10-CM

## 2020-05-10 DIAGNOSIS — Z8711 Personal history of peptic ulcer disease: Secondary | ICD-10-CM

## 2020-05-10 DIAGNOSIS — E88819 Insulin resistance, unspecified: Secondary | ICD-10-CM

## 2020-05-10 DIAGNOSIS — K219 Gastro-esophageal reflux disease without esophagitis: Secondary | ICD-10-CM

## 2020-05-10 DIAGNOSIS — B351 Tinea unguium: Secondary | ICD-10-CM

## 2020-05-10 DIAGNOSIS — R7989 Other specified abnormal findings of blood chemistry: Secondary | ICD-10-CM

## 2020-05-10 DIAGNOSIS — M545 Low back pain, unspecified: Secondary | ICD-10-CM

## 2020-05-10 MED ORDER — ATORVASTATIN CALCIUM 40 MG PO TABS: 40.0000 mg | ORAL_TABLET | Freq: Every day | ORAL | 1 refills | Status: DC

## 2020-05-10 MED ORDER — GABAPENTIN 100 MG PO CAPS: 200.0000 mg | ORAL_CAPSULE | Freq: Every day | ORAL | 1 refills | Status: DC

## 2020-05-10 MED ORDER — CYANOCOBALAMIN 1000 MCG/ML IJ SOLN: 1000.0000 ug | Freq: Once | INTRAMUSCULAR | Status: DC

## 2020-05-10 MED ORDER — QUETIAPINE FUMARATE 200 MG PO TABS: 200.0000 mg | ORAL_TABLET | Freq: Every day | ORAL | 1 refills | Status: DC

## 2020-05-10 MED ORDER — CYCLOBENZAPRINE HCL 5 MG PO TABS: ORAL_TABLET | ORAL | 1 refills | Status: DC

## 2020-05-10 MED ORDER — DULOXETINE HCL 60 MG PO CPEP: 60.0000 mg | ORAL_CAPSULE | Freq: Every day | ORAL | 1 refills | Status: DC

## 2020-05-11 LAB — CBC WITH DIFFERENTIAL/PLATELET
Absolute Monocytes: 474 cells/uL (ref 200–950)
Basophils Absolute: 32 cells/uL (ref 0–200)
Basophils Relative: 0.4 %
Eosinophils Absolute: 103 cells/uL (ref 15–500)
Eosinophils Relative: 1.3 %
HCT: 38.7 % (ref 35.0–45.0)
Hemoglobin: 12.7 g/dL (ref 11.7–15.5)
Lymphs Abs: 3468 cells/uL (ref 850–3900)
MCH: 29.3 pg (ref 27.0–33.0)
MCHC: 32.8 g/dL (ref 32.0–36.0)
MCV: 89.4 fL (ref 80.0–100.0)
MPV: 10.4 fL (ref 7.5–12.5)
Monocytes Relative: 6 %
Neutro Abs: 3824 cells/uL (ref 1500–7800)
Neutrophils Relative %: 48.4 %
Platelets: 313 10*3/uL (ref 140–400)
RBC: 4.33 10*6/uL (ref 3.80–5.10)
RDW: 13.6 % (ref 11.0–15.0)
Total Lymphocyte: 43.9 %
WBC: 7.9 10*3/uL (ref 3.8–10.8)

## 2020-05-11 LAB — TSH: TSH: 0.81 mIU/L

## 2020-05-11 LAB — IRON,TIBC AND FERRITIN PANEL
%SAT: 13 % (calc) — ABNORMAL LOW (ref 16–45)
Ferritin: 12 ng/mL — ABNORMAL LOW (ref 16–232)
Iron: 42 ug/dL — ABNORMAL LOW (ref 45–160)
TIBC: 324 mcg/dL (calc) (ref 250–450)

## 2020-05-11 NOTE — Progress Notes (Signed)
1. Skin , left medial thigh HEMANGIOMA  This is a benign blood vessel growth. No additional treatment is needed.  2. Skin , right abdomen (side) upper DYSPLASTIC NEVUS WITH MODERATE TO SEVERE ATYPIA, CLOSE TO MARGIN --> excision  This is a MODERATE TO SEVERELY ATYPICAL MOLE. On the spectrum from normal mole to melanoma skin cancer, this is in between the two but closer towards a melanoma skin cancer.  - The treatment of choice for severely atypical moles is to cut them out in clinic with an area of normal looking skin around them to get all the atypical cells out. The skin that is removed will be sent to check under the microscope again to be sure it looks completely out.   - People who have a history of atypical moles do have a slightly increased risk of developing melanoma somewhere on the body, so a full body skin exam by a dermatologist is recommended at least once a year. - Monthly self skin checks and daily sun protection are also recommended.  - Please also call if you notice any new or changing spots anywhere else on the body before your follow-up visit.   3. Skin , left medial cheek COMPATIBLE WITH SURFACE OF ANGIOFIBROMA  This is a benign growth that is common on the face. No treatment is needed.   4. Skin , right frontal hairline x1 SEBORRHEIC KERATOSIS, IRRITATED  This is a benign growth or "wisdom spot". No additional treatment is needed.   Dr. Jerilynn Mages left a voicemail 05/11/2020.  MAs please call starting 05/12/2020

## 2020-05-12 ENCOUNTER — Telehealth: Payer: Self-pay

## 2020-05-12 NOTE — Telephone Encounter (Signed)
-----   Message from Jessica Patten, MD sent at 05/11/2020  5:04 PM EST ----- 1. Skin , left medial thigh HEMANGIOMA  This is a benign blood vessel growth. No additional treatment is needed.  2. Skin , right abdomen (side) upper DYSPLASTIC NEVUS WITH MODERATE TO SEVERE ATYPIA, CLOSE TO MARGIN --> excision  This is a MODERATE TO SEVERELY ATYPICAL MOLE. On the spectrum from normal mole to melanoma skin cancer, this is in between the two but closer towards a melanoma skin cancer.  - The treatment of choice for severely atypical moles is to cut them out in clinic with an area of normal looking skin around them to get all the atypical cells out. The skin that is removed will be sent to check under the microscope again to be sure it looks completely out.   - People who have a history of atypical moles do have a slightly increased risk of developing melanoma somewhere on the body, so a full body skin exam by a dermatologist is recommended at least once a year. - Monthly self skin checks and daily sun protection are also recommended.  - Please also call if you notice any new or changing spots anywhere else on the body before your follow-up visit.   3. Skin , left medial cheek COMPATIBLE WITH SURFACE OF ANGIOFIBROMA  This is a benign growth that is common on the face. No treatment is needed.   4. Skin , right frontal hairline x1 SEBORRHEIC KERATOSIS, IRRITATED  This is a benign growth or "wisdom spot". No additional treatment is needed.   Dr. Jerilynn Mages left a voicemail 05/11/2020.  MAs please call starting 05/12/2020

## 2020-05-12 NOTE — Telephone Encounter (Signed)
Informed pt of all results and scheduled surgery.

## 2020-05-24 ENCOUNTER — Encounter: Payer: Self-pay | Admitting: General Practice

## 2020-05-31 IMAGING — CR DG RIBS W/ CHEST 3+V*L*
1 series · 3 of 3 positions shown · non-contrast
Comparison: Chest x-ray dated 06/06/2012.

CLINICAL DATA: Tripped and fell 2 days ago, hit L chest on deck
rail, pain since. Mechanical fall. Hx - hodgkin's lymphoma,
pre-diabetic, current smoker 0.5 ppd.

EXAM:
LEFT RIBS AND CHEST - 3+ VIEW

[Series 1: dg ribs unilateral w/chest left · 0.14mm/px · 3 of 3 slices shown]
[im 1/3]
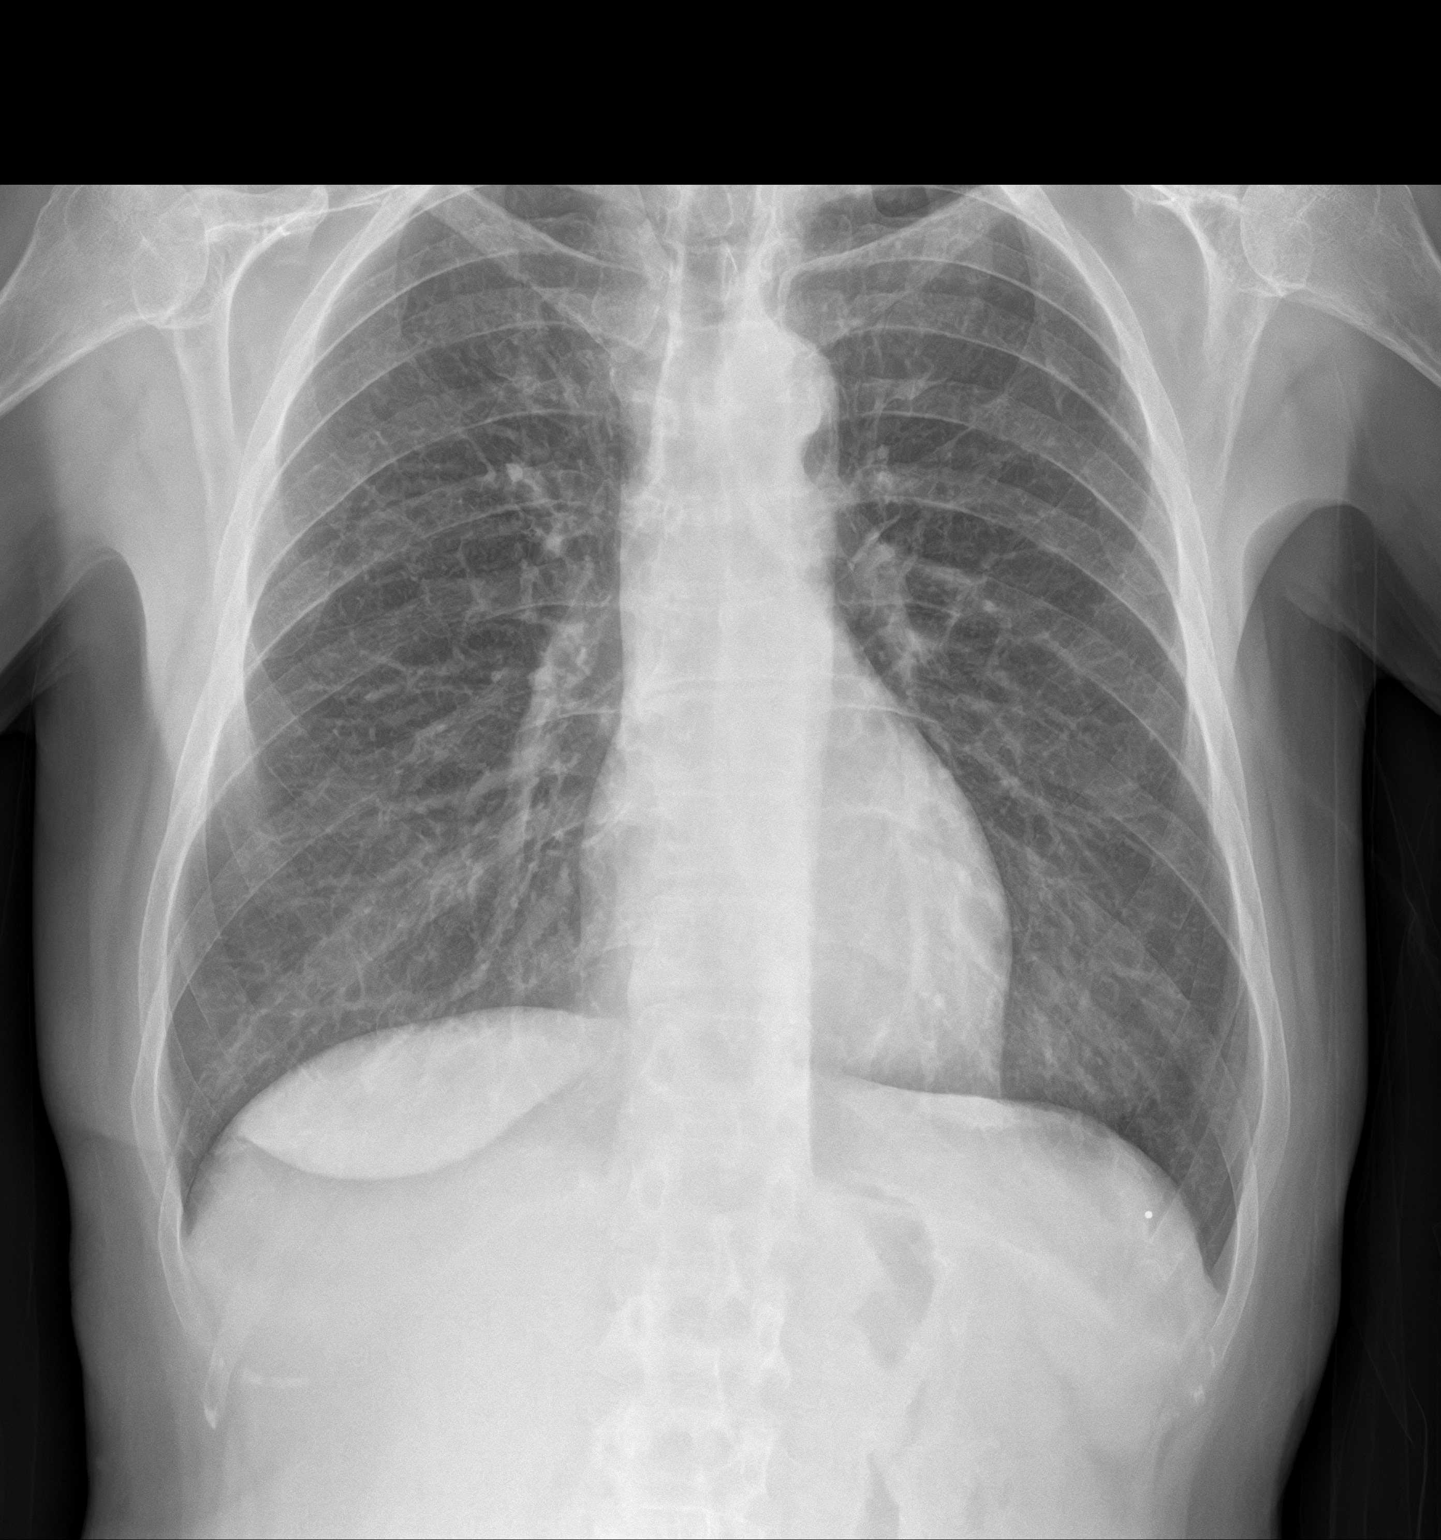
[im 2/3]
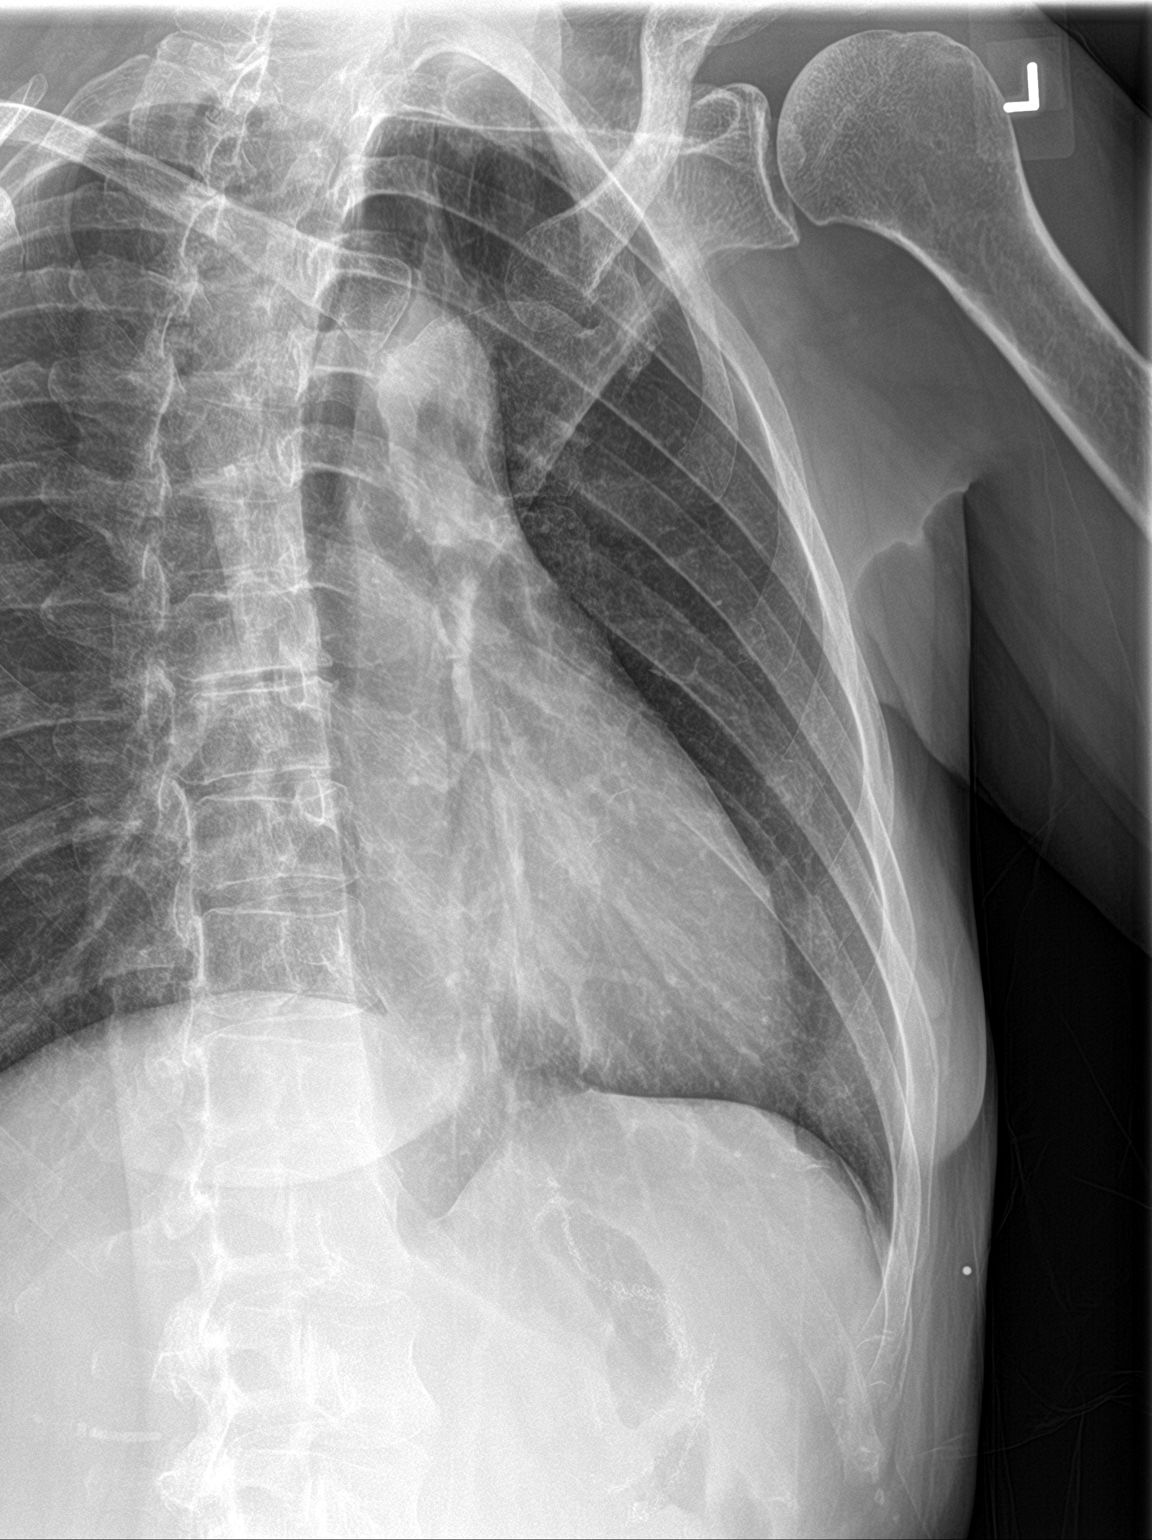
[im 3/3]
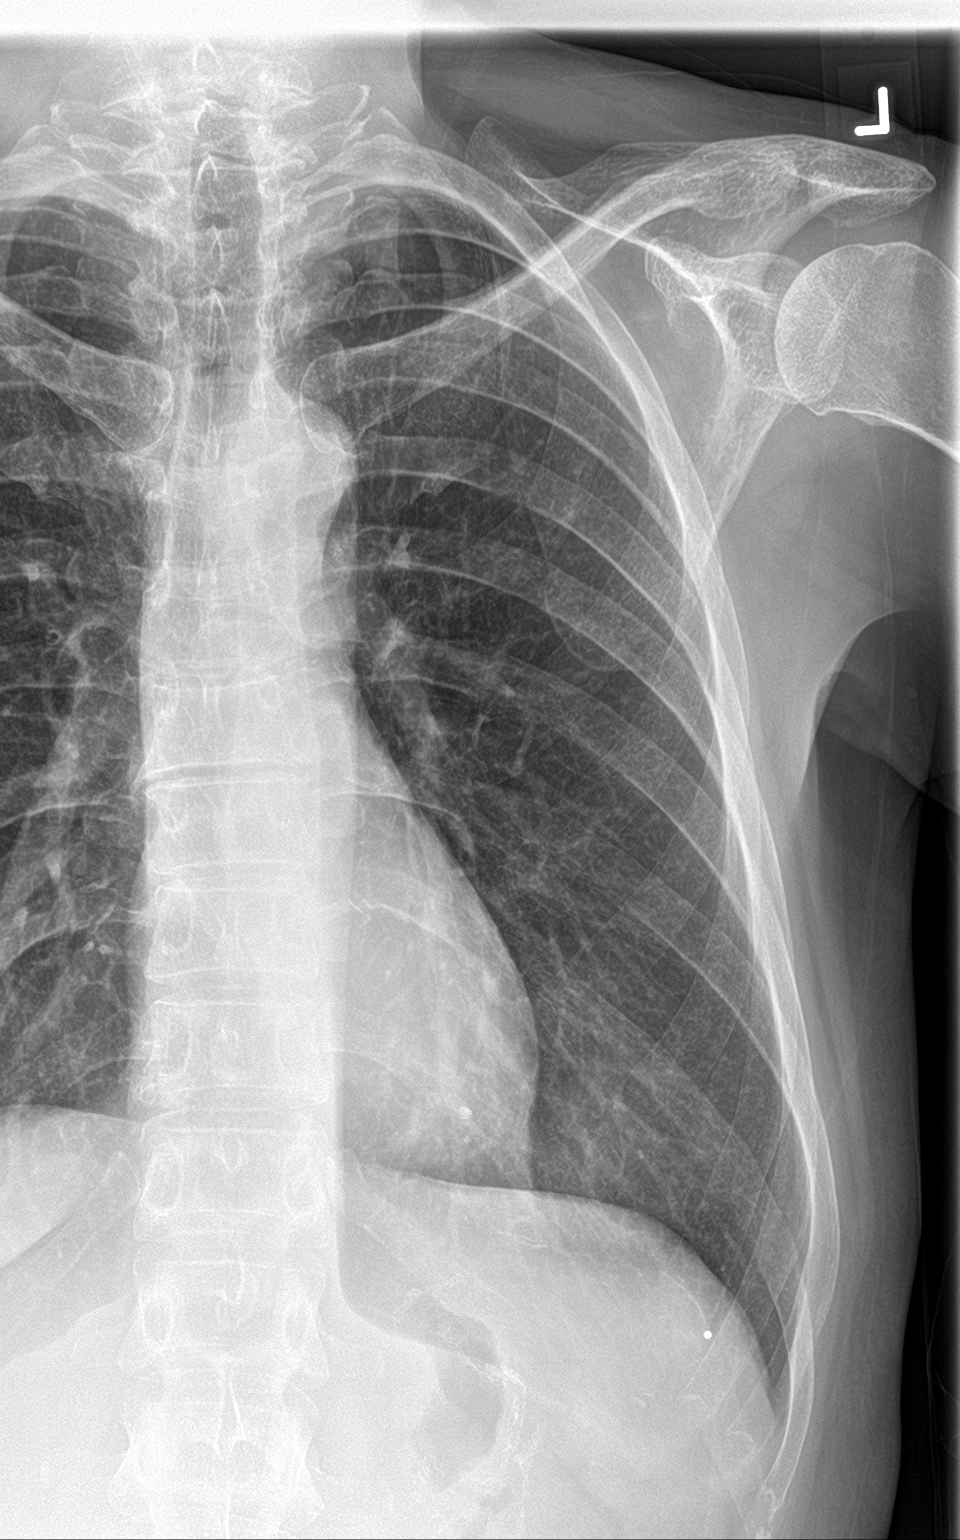

[3 of 3 positions shown; findings below may reference images not displayed]

FINDINGS: Single-view of the chest and two views of the LEFT ribs are
provided. Heart size and mediastinal contours are within normal
limits. Lungs are clear. No pleural effusion or pneumothorax seen.

Osseous structures about the chest are unremarkable. No LEFT-sided
rib fracture or displacement seen.
IMPRESSION: Negative.

## 2020-06-22 ENCOUNTER — Other Ambulatory Visit: Payer: Self-pay | Admitting: Dermatology

## 2020-06-22 ENCOUNTER — Ambulatory Visit (INDEPENDENT_AMBULATORY_CARE_PROVIDER_SITE_OTHER): Payer: No Typology Code available for payment source | Admitting: Dermatology

## 2020-06-22 ENCOUNTER — Encounter: Payer: Self-pay | Admitting: Dermatology

## 2020-06-22 ENCOUNTER — Other Ambulatory Visit: Payer: Self-pay

## 2020-06-22 DIAGNOSIS — D225 Melanocytic nevi of trunk: Secondary | ICD-10-CM

## 2020-06-22 DIAGNOSIS — D485 Neoplasm of uncertain behavior of skin: Secondary | ICD-10-CM

## 2020-06-22 MED ORDER — MUPIROCIN 2 % EX OINT: 1.0000 "application " | TOPICAL_OINTMENT | Freq: Every day | CUTANEOUS | 1 refills | Status: DC

## 2020-06-22 NOTE — Progress Notes (Signed)
   Follow-Up Visit   Subjective  Jessica [name] is a 55 y.o. female who presents for the following: Procedure (Patient here for excision of biopsy proven DYSPLASTIC NEVUS WITH MODERATE TO SEVERE ATYPIA at right upper abdomen. ).  The following portions of the chart were reviewed this encounter and updated as appropriate:   Tobacco  Allergies  Meds  Problems  Med Hx  Surg Hx  Fam Hx      Review of Systems:  No other skin or systemic complaints except as noted in HPI or Assessment and Plan.  Objective  Well appearing patient in no apparent distress; mood and affect are within normal limits.  A focused examination was performed including abdomen. Relevant physical exam findings are noted in the Assessment and Plan.  Objective  Right Abdomen (side) - Upper: Healing biopsy site   Assessment & Plan  Neoplasm of uncertain behavior of skin Right Abdomen (side) - Upper  Skin excision  Lesion length (cm):  0.7 Margin per side (cm):  0.5 Total excision diameter (cm):  1.7 Informed consent: discussed and consent obtained   Timeout: patient name, date of birth, surgical site, and procedure verified   Procedure prep:  Patient was prepped and draped in usual sterile fashion Prep type:  Chlorhexidine Anesthesia: the lesion was anesthetized in a standard fashion   Anesthetic:  1% lidocaine w/ epinephrine 1-100,000 buffered w/ 8.4% NaHCO3 Instrument used comment:  15c Hemostasis achieved with: suture, pressure and electrodesiccation   Outcome: patient tolerated procedure well with no complications   Post-procedure details: wound care instructions given   Additional details:  Mupirocin and a pressure dressing applied  Infra medial tag   Skin repair Complexity:  Intermediate Final length (cm):  4.7 Informed consent: discussed and consent obtained   Timeout: patient name, date of birth, surgical site, and procedure verified   Procedure prep:  Patient was prepped and draped in usual  sterile fashion Prep type:  Chlorhexidine Anesthesia: the lesion was anesthetized in a standard fashion   Anesthetic:  1% lidocaine w/ epinephrine 1-100,000 local infiltration Reason for type of repair: reduce tension to allow closure, reduce the risk of dehiscence, infection, and necrosis and enhance both functionality and cosmetic results   Undermining: edges undermined   Subcutaneous layers (deep stitches):  Suture size:  3-0 Suture type: Vicryl (polyglactin 910)   Stitches:  Buried vertical mattress Fine/surface layer approximation (top stitches):  Suture size:  4-0 Suture type: Prolene (polypropylene)   Suture removal (days):  7 Hemostasis achieved with: suture, pressure and electrodesiccation Outcome: patient tolerated procedure well with no complications   Post-procedure details: wound care instructions given   Additional details:  Mupirocin and a pressure dressing applied  Specimen 1 - Surgical pathology Differential Diagnosis: Biopsy proven DYSPLASTIC NEVUS WITH MODERATE TO SEVERE ATYPIA *Infra medial tag* Check Margins: Yes Healing biopsy site [phone]   Start mupirocin daily as directed.   Ordered Medications: mupirocin ointment (BACTROBAN) 2 %  Return in about 1 week (around 06/29/2020) for Suture Removal.  Graciella Belton, RMA, am acting as scribe for Forest Gleason, MD .  Documentation: I have reviewed the above documentation for accuracy and completeness, and I agree with the above.  Forest Gleason, MD

## 2020-06-22 NOTE — Patient Instructions (Signed)

## 2020-06-29 ENCOUNTER — Encounter: Payer: Self-pay | Admitting: Dermatology

## 2020-06-29 ENCOUNTER — Ambulatory Visit (INDEPENDENT_AMBULATORY_CARE_PROVIDER_SITE_OTHER): Payer: No Typology Code available for payment source | Admitting: Dermatology

## 2020-06-29 ENCOUNTER — Encounter: Payer: No Typology Code available for payment source | Admitting: Dermatology

## 2020-06-29 ENCOUNTER — Other Ambulatory Visit: Payer: Self-pay

## 2020-06-29 DIAGNOSIS — Z4802 Encounter for removal of sutures: Secondary | ICD-10-CM

## 2020-06-29 NOTE — Progress Notes (Signed)
   Follow-Up Visit   Subjective  Jessica [name] is a 55 y.o. female who presents for the following: Follow-up (Patient here today for suture removal at right upper abdomen s/p excision for DYSPLASTIC NEVUS WITH MODERATE TO SEVERE ATYPIA) with CLEAR MARGINS.  The following portions of the chart were reviewed this encounter and updated as appropriate:   Tobacco  Allergies  Meds  Problems  Med Hx  Surg Hx  Fam Hx      Review of Systems:  No other skin or systemic complaints except as noted in HPI or Assessment and Plan.  Objective  Well appearing patient in no apparent distress; mood and affect are within normal limits.  A focused examination was performed including abdomen. Relevant physical exam findings are noted in the Assessment and Plan.   Assessment & Plan    Encounter for Removal of Sutures - Incision site at the right upper abdomen is clean, dry and intact - Wound cleansed, sutures removed, wound cleansed and steri strips applied.  - Discussed pathology results showing margins free  - Patient advised to keep steri-strips dry until they fall off. - Scars remodel for a full year. - Once steri-strips fall off, patient can apply over-the-counter silicone scar cream each night to help with scar remodeling if desired. - Patient advised to call with any concerns or if they notice any new or changing lesions.   Return for as scheduled.  Graciella Belton, RMA, am acting as scribe for Forest Gleason, MD .  Documentation: I have reviewed the above documentation for accuracy and completeness, and I agree with the above.  Forest Gleason, MD

## 2020-07-06 ENCOUNTER — Encounter: Payer: Self-pay | Admitting: Dermatology

## 2020-08-03 NOTE — Progress Notes (Signed)
Name: Jessica [name]   MRN: [id number]    DOB: [birth year]   Date:08/05/2020       Progress Note  Subjective  Chief Complaint  Follow up   HPI   History of obesity: she had bariatric surgery 03/2011, maximum weight prior to surgery was 270 lbs, she went down to 142 lbs and was stable for many years, however when she was  diagnosed with bipolarin 2017and was placedon antipsychotic medicationsshe started to gain weight, March 2018 weight was 183 lbs. She was onLevothyroxine and Metformin andwent down to 165 lbs in May 2018 and we started her on Trulicity to help curb appetite and help with pre-diabetes/insulin resistanceand shewasbelow her goal weight ( it was 140 lbs), and eventually down  to 129 lbs, but she was having  nausea and epigastric pain, she finally saw GI came off Nsaid's and was given carafate, omeprazolefor treatment for jejunal ulcer. She felt better with medication and gained some weight. Her weight at home on 02/10/2020 was 168 lbs she joined weight watchers with her sister, cut down on sweets and carbs and was doing great, however in October she developed worsening of weight loss and epigastric pain, she was worried the peptic ulcer was back, she went back on carafate and pain resolved, able to eat again and has gained some weight back. ( 9 lbs in the same 3 months )   Pre-diabetes:hgbA1C was back in 2013 was 6.3%, but down to 5.3 % with weight loss , she was gradually gaining weight after bariatric surgery( 2012 )and we started her on Metformin 08/2016 , followed by Trulicity but we stopped medication when she lost a lot of weight in 2021   B12 deficiency:not currently getting B12 injections but is getting SL B12 at homedaily, last level was low and we will recheck it yearly.   Adult hypothyroidism:: associated with fatigue, and mild weight gain, started on Levothyroxine Fall 2020 and TSH has been at goal.   Hodgkin's lymphoma:she went back to see Dr. Tasia Catchings in  2019 last CBC was normal, iron storage was low but she was not eating at the time, stomach pain resolved and she has gained weight. Discussed importance of regular follow up with hematologist   Bipolar:no longer rseeingDr.Eappenbecause of cost,last visit was 01/2019,she has insurance again, but prefers not going back yet, she has been more anxious lately, worried about her mother, recently diagnosed with Multiple Myeloma, not sleeping well. She continues to refuse referral to Dr. Shea Evans, also discussed therapy   Atherosclerosis of aorta: she is on Atorvastatin and denies side effects, we will recheck labs next visit  Long haul COVID-19: diagnosed 02/05, only symptoms now are fatigue and lack of taste   Patient Active Problem List   Diagnosis Date Noted  . Iron deficiency anemia 01/09/2018  . Cervical high risk HPV (human papillomavirus) test positive 04/08/2017  . Abdominal pain, epigastric   . Bariatric surgery status   . Ulcer jejunum   . Adenoma of left adrenal gland 02/18/2017  . Abnormal TSH 08/30/2016  . Bipolar disorder ([place]) 12/02/2015  . HAV (hallux abducto valgus) 11/22/2015  . Bunion of great toe 11/08/2015  . Attention-deficit hyperactivity disorder, other type 10/28/2015  . Sciatica 04/22/2015  . B12 deficiency 12/01/2014  . History of bariatric surgery 12/01/2014  . Anxiety, generalized 12/01/2014  . Gastro-esophageal reflux disease without esophagitis 12/01/2014  . H/O elevated lipids 12/01/2014  . H/O: HTN (hypertension) 12/01/2014  . H/O: obesity 12/01/2014  . Hodgkin's disease  in remission ([place]) 12/01/2014  . Current tobacco use 12/01/2014  . Insomnia 03/24/2008  . Abnormal serum level of alkaline phosphatase 11/08/2006  . Chronic recurrent major depressive disorder ([place]) 11/08/2006    Past Surgical History:  Procedure Laterality Date  . ABDOMINAL HYSTERECTOMY    . BUNIONECTOMY Right   . CARPAL TUNNEL RELEASE    . COLONOSCOPY     multiple  .  COLONOSCOPY WITH PROPOFOL N/A 11/20/2019   Procedure: COLONOSCOPY WITH PROPOFOL;  Surgeon: Lin Landsman, MD;  [facility] ENDOSCOPY;  Service: Gastroenterology;  Laterality: N/A;  . ESOPHAGOGASTRODUODENOSCOPY (EGD) WITH PROPOFOL N/A 02/21/2017   Procedure: ESOPHAGOGASTRODUODENOSCOPY (EGD) WITH PROPOFOL;  Surgeon: Lucilla Lame, MD;  Location: [place];  Service: Gastroenterology;  Laterality: N/A;  Pre-diabetic  . excision of neck mass  09/1998  . GASTRIC BYPASS  03/12/2011  . LAPAROSCOPIC SUPRACERVICAL HYSTERECTOMY  2008   and unilateral salpingoophorectomy/ endometriosis-Dr Kincius  . TONSILLECTOMY      Family History  Problem Relation Age of Onset  . Cancer Mother 33       colon  . Epilepsy Mother   . Multiple myeloma Mother   . Glaucoma Father   . Breast cancer Maternal Grandmother 21       inflamitory breast ca    Social History   Tobacco Use  . Smoking status: Current Every Day Smoker    Packs/day: 0.50    Years: 16.00    Pack years: 8.00    Types: Cigarettes    Start date: 06/11/2000  . Smokeless tobacco: Never Used  Substance Use Topics  . Alcohol use: No    Alcohol/week: 0.0 standard drinks    Comment: rarely     Current Outpatient Medications:  .  Ascorbic Acid (VITAMIN C) 1000 MG tablet, Take 1,000 mg by mouth daily., Disp: , Rfl:  .  atorvastatin (LIPITOR) 40 MG tablet, Take 1 tablet (40 mg total) by mouth daily., Disp: 90 tablet, Rfl: 1 .  BIOTIN PO, Take by mouth., Disp: , Rfl:  .  Cholecalciferol (VITAMIN D) 2000 units CAPS, Take 1 capsule (2,000 Units total) by mouth daily., Disp: 30 capsule, Rfl: 0 .  cyclobenzaprine (FLEXERIL) 5 MG tablet, TAKE 1 TABLET BY MOUTH AT BEDTIME AS NEEDED FOR MUSCLE SPASM, Disp: 90 tablet, Rfl: 1 .  gabapentin (NEURONTIN) 100 MG capsule, Take 2 capsules (200 mg total) by mouth at bedtime., Disp: 180 capsule, Rfl: 1 .  hydrOXYzine (ATARAX/VISTARIL) 10 MG tablet, TAKE 1 TABLET BY MOUTH 3 TIMES DAILY AS NEEDED.,  Disp: 270 tablet, Rfl: 1 .  Multiple Vitamins-Minerals (MULTI-VITAMIN GUMMIES) CHEW, Chew 2 capsules by mouth daily., Disp: 30 tablet, Rfl: 0 .  omeprazole (PRILOSEC) 40 MG capsule, TAKE 1 CAPSULE BY MOUTH EVERY DAY, Disp: 90 capsule, Rfl: 1 .  Probiotic Product (PROBIOTIC DAILY PO), Take by mouth., Disp: , Rfl:  .  DULoxetine (CYMBALTA) 60 MG capsule, Take 1 capsule (60 mg total) by mouth daily., Disp: 90 capsule, Rfl: 1 .  levothyroxine (SYNTHROID) 25 MCG tablet, Take it daily, Disp: 90 tablet, Rfl: 1 .  QUEtiapine (SEROQUEL) 200 MG tablet, Take 1 tablet (200 mg total) by mouth at bedtime., Disp: 90 tablet, Rfl: 1  Allergies  Allergen Reactions  . Vicodin [Hydrocodone-Acetaminophen] Other (See Comments)    Has really bad headaches/ migraine      I personally reviewed active problem list, medication list, allergies, family history, social history, health maintenance with the patient/caregiver today.   ROS  Constitutional: Negative for  fever, positive for  weight change.  Respiratory: Negative for cough and shortness of breath.   Cardiovascular: Negative for chest pain or palpitations.  Gastrointestinal: Negative for abdominal pain, no bowel changes.  Musculoskeletal: Negative for gait problem or joint swelling.  Skin: Negative for rash.  Neurological: Negative for dizziness or headache.  No other specific complaints in a complete review of systems (except as listed in HPI above).  Objective  Vitals:   08/05/20 1402  BP: 128/72  Pulse: 93  Resp: 16  Temp: 99.1 F (37.3 C)  TempSrc: Oral  SpO2: 99%  Weight: 143 lb 12.8 oz (65.2 kg)  Height: 5' 5" (1.651 m)    Body mass index is 23.93 kg/m.  Physical Exam  Constitutional: Patient appears well-developed and well-nourished. Overweight.  No distress.  HEENT: head atraumatic, normocephalic, pupils equal and reactive to light,  neck supple Cardiovascular: Normal rate, regular rhythm and normal heart sounds.  No murmur  heard. No BLE edema. Pulmonary/Chest: Effort normal and breath sounds normal. No respiratory distress. Abdominal: Soft.  There is no tenderness. Psychiatric: Patient has a normal mood and affect. behavior is normal. Judgment and thought content normal.  Recent Results (from the past 2160 hour(s))  CBC with Differential/Platelet     Status: None   Collection Time: 05/10/20  2:37 PM  Result Value Ref Range   WBC 7.9 3.8 - 10.8 Thousand/uL   RBC 4.33 3.80 - 5.10 Million/uL   Hemoglobin 12.7 11.7 - 15.5 g/dL   HCT 38.7 35.0 - 45.0 %   MCV 89.4 80.0 - 100.0 fL   MCH 29.3 27.0 - 33.0 pg   MCHC 32.8 32.0 - 36.0 g/dL   RDW 13.6 11.0 - 15.0 %   Platelets 313 140 - 400 Thousand/uL   MPV 10.4 7.5 - 12.5 fL   Neutro Abs 3,824 1,500 - 7,800 cells/uL   Lymphs Abs 3,468 850 - 3,900 cells/uL   Absolute Monocytes 474 200 - 950 cells/uL   Eosinophils Absolute 103 15 - 500 cells/uL   Basophils Absolute 32 0 - 200 cells/uL   Neutrophils Relative % 48.4 %   Total Lymphocyte 43.9 %   Monocytes Relative 6.0 %   Eosinophils Relative 1.3 %   Basophils Relative 0.4 %  Iron, TIBC and Ferritin Panel     Status: Abnormal   Collection Time: 05/10/20  2:37 PM  Result Value Ref Range   Iron 42 (L) 45 - 160 mcg/dL   TIBC 324 250 - 450 mcg/dL (calc)   %SAT 13 (L) 16 - 45 % (calc)   Ferritin 12 (L) 16 - 232 ng/mL  TSH     Status: None   Collection Time: 05/10/20  2:37 PM  Result Value Ref Range   TSH 0.81 mIU/L    Comment:           Reference Range .           > or = 20 Years  0.40-4.50 .                Pregnancy Ranges           First trimester    0.26-2.66           Second trimester   0.55-2.73           Third trimester    0.43-2.91      PHQ2/9: Depression screen [facility] 2/9 08/05/2020 05/10/2020 11/23/2019 10/21/2019 05/05/2019  Decreased Interest 1 1 0 0 1  Down,  Depressed, Hopeless 1 1 0 0 0  PHQ - 2 Score 2 2 0 0 1  Altered sleeping 0 3 0 3 3  Tired, decreased energy 1 2 0 2 3  Change in  appetite 0 3 0 0 0  Feeling bad or failure about yourself  0 0 0 0 0  Trouble concentrating 0 0 0 0 1  Moving slowly or fidgety/restless 0 0 0 1 1  Suicidal thoughts 0 0 0 0 0  PHQ-9 Score 3 10 0 6 9  Difficult doing work/chores Not difficult at all - - Somewhat difficult Somewhat difficult  Some recent data might be hidden    phq 9 is positive   Fall Risk: Fall Risk  08/05/2020 05/10/2020 11/23/2019 10/21/2019 05/05/2019  Falls in the past year? 0 0 0 0 0  Number falls in past yr: 0 0 0 0 0  Injury with Fall? 0 0 0 0 0  Follow up - - - Falls evaluation completed -     Functional Status Survey: Is the patient deaf or have difficulty hearing?: No Does the patient have difficulty seeing, even when wearing glasses/contacts?: No Does the patient have difficulty concentrating, remembering, or making decisions?: No Does the patient have difficulty walking or climbing stairs?: No Does the patient have difficulty dressing or bathing?: No Does the patient have difficulty doing errands alone such as visiting a doctor's office or shopping?: No   Assessment & Plan  1. Bipolar 1 disorder, mixed, moderate (HCC)  - QUEtiapine (SEROQUEL) 200 MG tablet; Take 1 tablet (200 mg total) by mouth at bedtime.  Dispense: 90 tablet; Refill: 1 - DULoxetine (CYMBALTA) 60 MG capsule; Take 1 capsule (60 mg total) by mouth daily.  Dispense: 90 capsule; Refill: 1  2. Hodgkin's disease in remission [facility])  Discussed importance of regular follow ups with Dr. Tasia Catchings  3. GAD (generalized anxiety disorder)  - QUEtiapine (SEROQUEL) 200 MG tablet; Take 1 tablet (200 mg total) by mouth at bedtime.  Dispense: 90 tablet; Refill: 1 - DULoxetine (CYMBALTA) 60 MG capsule; Take 1 capsule (60 mg total) by mouth daily.  Dispense: 90 capsule; Refill: 1  4. B12 deficiency  Taking SL supplementation   5. Gastro-esophageal reflux disease without esophagitis   6. Vitamin D deficiency  Continue supplementation   7. Iron  deficiency   8. Atherosclerosis of abdominal aorta (HCC)  Continue statin therapy   9. History of peptic ulcer  Seems to be doing better since she took carafate  10. Other specified hypothyroidism  - levothyroxine (SYNTHROID) 25 MCG tablet; Take it daily  Dispense: 90 tablet; Refill: 1  11. COVID-19 long hauler  Fatigue and taste problems, reassurance for now

## 2020-08-05 ENCOUNTER — Ambulatory Visit (INDEPENDENT_AMBULATORY_CARE_PROVIDER_SITE_OTHER): Payer: No Typology Code available for payment source | Admitting: Family Medicine

## 2020-08-05 ENCOUNTER — Other Ambulatory Visit: Payer: Self-pay

## 2020-08-05 ENCOUNTER — Other Ambulatory Visit: Payer: Self-pay | Admitting: Family Medicine

## 2020-08-05 ENCOUNTER — Encounter: Payer: Self-pay | Admitting: Family Medicine

## 2020-08-05 VITALS — BP 128/72 | HR 93 | Temp 99.1°F | Resp 16 | Ht 65.0 in | Wt 143.8 lb

## 2020-08-05 DIAGNOSIS — K219 Gastro-esophageal reflux disease without esophagitis: Secondary | ICD-10-CM

## 2020-08-05 DIAGNOSIS — E611 Iron deficiency: Secondary | ICD-10-CM

## 2020-08-05 DIAGNOSIS — F411 Generalized anxiety disorder: Secondary | ICD-10-CM

## 2020-08-05 DIAGNOSIS — U099 Post covid-19 condition, unspecified: Secondary | ICD-10-CM

## 2020-08-05 DIAGNOSIS — I700 Atherosclerosis of aorta: Secondary | ICD-10-CM

## 2020-08-05 DIAGNOSIS — E538 Deficiency of other specified B group vitamins: Secondary | ICD-10-CM | POA: Diagnosis not present

## 2020-08-05 DIAGNOSIS — Z8711 Personal history of peptic ulcer disease: Secondary | ICD-10-CM

## 2020-08-05 DIAGNOSIS — E038 Other specified hypothyroidism: Secondary | ICD-10-CM

## 2020-08-05 DIAGNOSIS — C8190 Hodgkin lymphoma, unspecified, unspecified site: Secondary | ICD-10-CM | POA: Diagnosis not present

## 2020-08-05 DIAGNOSIS — F3162 Bipolar disorder, current episode mixed, moderate: Secondary | ICD-10-CM | POA: Diagnosis not present

## 2020-08-05 DIAGNOSIS — E559 Vitamin D deficiency, unspecified: Secondary | ICD-10-CM

## 2020-08-05 MED ORDER — LEVOTHYROXINE SODIUM 25 MCG PO TABS: ORAL_TABLET | ORAL | 1 refills | Status: DC

## 2020-08-05 MED ORDER — DULOXETINE HCL 60 MG PO CPEP: 60.0000 mg | ORAL_CAPSULE | Freq: Every day | ORAL | 1 refills | Status: DC

## 2020-08-05 MED ORDER — QUETIAPINE FUMARATE 200 MG PO TABS
200.0000 mg | ORAL_TABLET | Freq: Every day | ORAL | 1 refills | Status: DC
Start: 2020-08-05 — End: 2020-08-05

## 2020-08-10 ENCOUNTER — Encounter: Payer: No Typology Code available for payment source | Admitting: Dermatology

## 2020-09-06 ENCOUNTER — Encounter: Payer: Self-pay | Admitting: Family Medicine

## 2020-09-06 ENCOUNTER — Other Ambulatory Visit: Payer: Self-pay

## 2020-09-06 ENCOUNTER — Other Ambulatory Visit: Payer: Self-pay | Admitting: Family Medicine

## 2020-09-06 DIAGNOSIS — K130 Diseases of lips: Secondary | ICD-10-CM

## 2020-09-06 MED ORDER — NYSTATIN-TRIAMCINOLONE 100000-0.1 UNIT/GM-% EX OINT: 1.0000 "application " | TOPICAL_OINTMENT | Freq: Two times a day (BID) | CUTANEOUS | 0 refills | Status: DC

## 2020-09-19 ENCOUNTER — Other Ambulatory Visit: Payer: Self-pay

## 2020-09-19 MED ORDER — ACETAMINOPHEN-CODEINE #3 300-30 MG PO TABS
ORAL_TABLET | ORAL | 0 refills | Status: DC
  Filled 2020-09-19: qty 12, 4d supply, fill #0

## 2020-09-28 ENCOUNTER — Other Ambulatory Visit: Payer: Self-pay

## 2020-09-28 ENCOUNTER — Ambulatory Visit (INDEPENDENT_AMBULATORY_CARE_PROVIDER_SITE_OTHER): Payer: No Typology Code available for payment source | Admitting: Dermatology

## 2020-09-28 DIAGNOSIS — L7211 Pilar cyst: Secondary | ICD-10-CM

## 2020-09-28 DIAGNOSIS — D485 Neoplasm of uncertain behavior of skin: Secondary | ICD-10-CM

## 2020-09-28 MED ORDER — DOXYCYCLINE MONOHYDRATE 100 MG PO CAPS
100.0000 mg | ORAL_CAPSULE | Freq: Two times a day (BID) | ORAL | 0 refills | Status: AC
Start: 2020-09-28 — End: 2020-10-04
  Filled 2020-09-28: qty 10, 5d supply, fill #0

## 2020-09-28 NOTE — Patient Instructions (Addendum)
Wound Care Instructions  1. Cleanse wound gently with soap and water once a day then pat dry with clean gauze. Apply a thing coat of Petrolatum (petroleum jelly, "Vaseline") over the wound (unless you have an allergy to this). We recommend that you use a new, sterile tube of Vaseline. Do not pick or remove scabs. Do not remove the yellow or white "healing tissue" from the base of the wound.  2. Cover the wound with fresh, clean, nonstick gauze and secure with paper tape. You may use Band-Aids in place of gauze and tape if the would is small enough, but would recommend trimming much of the tape off as there is often too much. Sometimes Band-Aids can irritate the skin.  3. You should call the office for your biopsy report after 1 week if you have not already been contacted.  4. If you experience any problems, such as abnormal amounts of bleeding, swelling, significant bruising, significant pain, or evidence of infection, please call the office immediately.  5. FOR ADULT SURGERY PATIENTS: If you need something for pain relief you may take 1 extra strength Tylenol (acetaminophen) AND 2 Ibuprofen (200mg each) together every 4 hours as needed for pain. (do not take these if you are allergic to them or if you have a reason you should not take them.) Typically, you may only need pain medication for 1 to 3 days.    If you have any questions or concerns for your doctor, please call our main line at [phone] and press option 4 to reach your doctor's medical assistant. If no one answers, please leave a voicemail as directed and we will return your call as soon as possible. Messages left after 4 pm will be answered the following business day.   You may also send us a message via MyChart. We typically respond to MyChart messages within 1-2 business days.  For prescription refills, please ask your pharmacy to contact our office. Our fax number is [phone].  If you have an urgent issue when the clinic is  closed that cannot wait until the next business day, you can page your doctor at the number below.    Please note that while we do our best to be available for urgent issues outside of office hours, we are not available 24/7.   If you have an urgent issue and are unable to reach us, you may choose to seek medical care at your doctor's office, retail clinic, urgent care center, or emergency room.  If you have a medical emergency, please immediately call 911 or go to the emergency department.  Pager Numbers  - Dr. Kowalski: [phone]  - Dr. Moye: [phone]  - Dr. Stewart: [phone]  In the event of inclement weather, please call our main line at [phone] for an update on the status of any delays or closures.  Dermatology Medication Tips: Please keep the boxes that topical medications come in in order to help keep track of the instructions about where and how to use these. Pharmacies typically print the medication instructions only on the boxes and not directly on the medication tubes.   If your medication is too expensive, please contact our office at [phone] option 4 or send us a message through MyChart.   We are unable to tell what your co-pay for medications will be in advance as this is different depending on your insurance coverage. However, we may be able to find a substitute medication at lower cost or fill out paperwork to   get insurance to cover a needed medication.   If a prior authorization is required to get your medication covered by your insurance company, please allow [place] 1-2 business days to complete this process.  Drug prices often vary depending on where the prescription is filled and some pharmacies may offer cheaper prices.  The website www.goodrx.com contains coupons for medications through different pharmacies. The prices here do not account for what the cost may be with help from insurance (it may be cheaper with your insurance), but the website can  give you the price if you did not use any insurance.  - You can print the associated coupon and take it with your prescription to the pharmacy.  - You may also stop by our office during regular business hours and pick up a GoodRx coupon card.  - If you need your prescription sent electronically to a different pharmacy, notify our office through [facility] or by phone at [phone] option 4.   Doxycycline should be taken with food to prevent nausea. Do not lay down for 30 minutes after taking. Be cautious with sun exposure and use good sun protection while on this medication. Pregnant women should not take this medication.

## 2020-09-28 NOTE — Progress Notes (Signed)
Follow-Up Visit   Subjective  Jessica [name] is a 55 y.o. female who presents for the following: Procedure (Patient here today for excision of pilar cyst at scalp. ).  The following portions of the chart were reviewed this encounter and updated as appropriate:   Tobacco  Allergies  Meds  Problems  Med Hx  Surg Hx  Fam Hx      Review of Systems:  No other skin or systemic complaints except as noted in HPI or Assessment and Plan.  Objective  Well appearing patient in no apparent distress; mood and affect are within normal limits.  A focused examination was performed including scalp. Relevant physical exam findings are noted in the Assessment and Plan.  Objective  Right Parietal Scalp: 1.2cm Subcutaneous nodule at scalp   Assessment & Plan  Neoplasm of uncertain behavior of skin Right Parietal Scalp  Skin excision  Lesion length (cm):  1.2 Total excision diameter (cm):  1.2 Informed consent: discussed and consent obtained   Timeout: patient name, date of birth, surgical site, and procedure verified   Procedure prep:  Patient was prepped and draped in usual sterile fashion Prep type:  Chlorhexidine Anesthesia: the lesion was anesthetized in a standard fashion   Anesthetic:  1% lidocaine w/ epinephrine 1-100,000 buffered w/ 8.4% NaHCO3 (5cc) Instrument used comment:  15c Hemostasis achieved with: suture, pressure and electrodesiccation   Outcome: patient tolerated procedure well with no complications   Post-procedure details: wound care instructions given   Additional details:  Mupirocin and a pressure dressing applied  Skin repair Complexity:  Intermediate Final length (cm):  1.8 Informed consent: discussed and consent obtained   Timeout: patient name, date of birth, surgical site, and procedure verified   Procedure prep:  Patient was prepped and draped in usual sterile fashion Prep type:  Chlorhexidine Anesthesia: the lesion was anesthetized in a standard fashion    Anesthetic:  1% lidocaine w/ epinephrine 1-100,000 local infiltration Reason for type of repair: reduce tension to allow closure, reduce the risk of dehiscence, infection, and necrosis, reduce subcutaneous dead space and avoid a hematoma, allow closure of the large defect, allow side-to-side closure without requiring a flap or graft and enhance both functionality and cosmetic results   Undermining: area extensively undermined   Subcutaneous layers (deep stitches):  Suture size:  3-0 Suture type: Prolene (polypropylene)   Stitches:  Buried vertical mattress Fine/surface layer approximation (top stitches):  Suture size:  4-0 Suture type: Vicryl (polyglactin 910)   Suture removal (days):  14 Hemostasis achieved with: pressure and electrodesiccation Outcome: patient tolerated procedure well with no complications   Post-procedure details: wound care instructions given   Additional details:  Mupirocin and a pressure bandage applied   Specimen 1 - Surgical pathology Differential Diagnosis: Pilar Cyst vs Other  Check Margins: No 1.2cm Subcutaneous nodule at scalp  Start doxycycline monohydrate 100mg   1 PO BID with food x 5 days. Doxycycline should be taken with food to prevent nausea. Do not lay down for 30 minutes after taking. Be cautious with sun exposure and use good sun protection while on this medication. Pregnant women should not take this medication.    Ordered Medications: doxycycline (MONODOX) 100 MG capsule  Return in about 2 weeks (around 10/12/2020) for Suture Removal.  Graciella Belton, RMA, am acting as scribe for Forest Gleason, MD .   Documentation: I have reviewed the above documentation for accuracy and completeness, and I agree with the above.  Forest Gleason, MD

## 2020-09-29 ENCOUNTER — Telehealth: Payer: Self-pay

## 2020-09-29 NOTE — Telephone Encounter (Signed)
Left pt msg to call if any questions or issues following yesterday's surgery, JS

## 2020-10-03 ENCOUNTER — Encounter: Payer: Self-pay | Admitting: Dermatology

## 2020-10-12 ENCOUNTER — Ambulatory Visit (INDEPENDENT_AMBULATORY_CARE_PROVIDER_SITE_OTHER): Payer: No Typology Code available for payment source | Admitting: Dermatology

## 2020-10-12 ENCOUNTER — Other Ambulatory Visit: Payer: Self-pay

## 2020-10-12 DIAGNOSIS — Z4802 Encounter for removal of sutures: Secondary | ICD-10-CM

## 2020-10-12 NOTE — Patient Instructions (Signed)

## 2020-10-12 NOTE — Progress Notes (Signed)
   Follow-Up Visit   Subjective  Jessica [name] is a 55 y.o. female who presents for the following: Follow-up (Patient here today for suture removal s/p excision of pilar cyst at right parietal scalp.).    The following portions of the chart were reviewed this encounter and updated as appropriate:   Tobacco  Allergies  Meds  Problems  Med Hx  Surg Hx  Fam Hx      Review of Systems:  No other skin or systemic complaints except as noted in HPI or Assessment and Plan.  Objective  Well appearing patient in no apparent distress; mood and affect are within normal limits.  A focused examination was performed including scalp. Relevant physical exam findings are noted in the Assessment and Plan.    Assessment & Plan  Encounter for removal of sutures    Encounter for Removal of Sutures - Incision site at the right parietal scalp is clean, dry and intact - Wound cleansed, sutures removed, wound cleansed. - Discussed pathology results showing pilar cyst  - Scars remodel for a full year. - Patient advised to call with any concerns or if they notice any new or changing lesions.   Return 1-2 months, for TBSE.  Graciella Belton, RMA, am acting as scribe for Forest Gleason, MD .  Documentation: I have reviewed the above documentation for accuracy and completeness, and I agree with the above.  Forest Gleason, MD

## 2020-10-24 ENCOUNTER — Encounter: Payer: Self-pay | Admitting: Dermatology

## 2020-11-10 ENCOUNTER — Other Ambulatory Visit: Payer: Self-pay | Admitting: Family Medicine

## 2020-11-10 ENCOUNTER — Other Ambulatory Visit: Payer: Self-pay

## 2020-11-10 MED ORDER — HYDROXYZINE HCL 10 MG PO TABS
ORAL_TABLET | ORAL | 0 refills | Status: DC
  Filled 2020-11-10: qty 270, 90d supply, fill #0

## 2020-11-10 MED FILL — Cyclobenzaprine HCl Tab 5 MG: ORAL | 90 days supply | Qty: 90 | Fill #0 | Status: AC

## 2020-11-10 MED FILL — Duloxetine HCl Enteric Coated Pellets Cap 60 MG (Base Eq): ORAL | 90 days supply | Qty: 90 | Fill #0 | Status: AC

## 2020-11-10 MED FILL — Gabapentin Cap 100 MG: ORAL | 90 days supply | Qty: 180 | Fill #0 | Status: AC

## 2020-11-10 MED FILL — Quetiapine Fumarate Tab 200 MG: ORAL | 90 days supply | Qty: 90 | Fill #0 | Status: AC

## 2020-11-10 MED FILL — Atorvastatin Calcium Tab 40 MG (Base Equivalent): ORAL | 90 days supply | Qty: 90 | Fill #0 | Status: AC

## 2020-11-10 MED FILL — Levothyroxine Sodium Tab 25 MCG: ORAL | 90 days supply | Qty: 90 | Fill #0 | Status: AC

## 2020-11-11 ENCOUNTER — Other Ambulatory Visit: Payer: Self-pay

## 2020-11-14 ENCOUNTER — Other Ambulatory Visit: Payer: Self-pay

## 2020-11-14 NOTE — Telephone Encounter (Signed)
[place] with [facility] to see about samples of Trulicity 1.5 mg. Lvm

## 2020-11-16 ENCOUNTER — Other Ambulatory Visit: Payer: Self-pay

## 2020-11-17 ENCOUNTER — Other Ambulatory Visit: Payer: Self-pay | Admitting: Family Medicine

## 2020-11-17 ENCOUNTER — Other Ambulatory Visit: Payer: Self-pay

## 2020-11-17 MED ORDER — TRULICITY 1.5 MG/0.5ML ~~LOC~~ SOAJ
SUBCUTANEOUS | 1 refills | Status: DC
  Filled 2020-11-17: qty 6, 84d supply, fill #0
  Filled 2021-04-05: qty 6, 84d supply, fill #1

## 2020-11-22 ENCOUNTER — Ambulatory Visit: Payer: No Typology Code available for payment source | Admitting: Family Medicine

## 2020-12-02 ENCOUNTER — Other Ambulatory Visit: Payer: Self-pay

## 2020-12-02 DIAGNOSIS — Z111 Encounter for screening for respiratory tuberculosis: Secondary | ICD-10-CM

## 2020-12-05 LAB — QUANTIFERON-TB GOLD PLUS
Mitogen-NIL: >10 IU/mL
NIL: 0.06 IU/mL
QuantiFERON-TB Gold Plus: NEGATIVE
TB1-NIL: 0.04 IU/mL
TB2-NIL: 0.04 IU/mL

## 2021-01-02 NOTE — Progress Notes (Signed)
Name: Jessica [name]   MRN: [id number]    DOB: November 18, 1965   Date:01/03/2021       Progress Note  Subjective  Chief Complaint  Follow Up  HPI  History of obesity: she had bariatric surgery 03/2011, maximum weight prior to surgery was 270 lbs, she went down to 142 lbs and was stable for many years, however when she was  diagnosed with bipolar in 2017 and was placed on antipsychotic medications she started to gain weight, March 2018 weight was 183 lbs. She was on Levothyroxine and Metformin and went down to 165 lbs in May 2018 and we started her on Trulicity to help curb appetite and help with pre-diabetes/insulin resistance and she was  below her goal weight ( it was 140 lbs), and eventually down  to 129 lbs, but she was having  nausea and epigastric pain, she finally saw GI came off Nsaid's and was given carafate, omeprazole for treatment for jejunal ulcer. She felt better with medication and gained some weight. Her weight at home on 02/10/2020 was 168 lbs she joined weight watchers with her sister, cut down on sweets and carbs and was doing great, however in October 2021 she developed worsening of weight loss and epigastric pain, she was worried the peptic ulcer was back, she went back on carafate and pain resolved, able to eat again and has gained some weight back., today her weight is 154 lbs, states no longer vomiting or having epigastric , she would like to keep her weight around 150 lbs    Pre-diabetes:hgbA1C was back in 2013 was 6.3%, but down to 5.3 % with weight loss , she was gradually gaining weight after bariatric surgery ( 2012 )  and we started her on Metformin 08/2016 , followed by Trulicity but we stopped medication when she lost a lot of weight in 2021, but she has been back since early 2022 and weight is good at this time     B12 deficiency: not currently getting B12 injections , she is also not using SL B12, explained importance of resuming supplementation    Adult hypothyroidism: :  associated with fatigue, and mild weight gain, started on Levothyroxine Fall 2020 . She has noticed hair loss ( since COVID-19 back Feb), we will recheck labs today    Hodgkin's lymphoma:she went back to see Dr. Cathie Hoops in 2019 last CBC was normal, iron storage was low but she was not eating at the time, stomach pain resolved and she has gained weight. We will continue to monitor   Bipolar: no longer rseeing Dr.Eappen because of cost , last visit was 01/2019,she has insurance again, but prefers not going back yet, she has been more anxious lately, worried about her mother, she was diagnosed with Multiple Myeloma, she is still feeling anxious, not sleeping well, discussed at least to go for therapy and she is considering it   Atherosclerosis of aorta: she is on Atorvastatin and denies side effects, we will recheck labs today, no side effects of medications     Patient Active Problem List   Diagnosis Date Noted   Iron deficiency anemia 01/09/2018   Cervical high risk HPV (human papillomavirus) test positive 04/08/2017   Abdominal pain, epigastric    Bariatric surgery status    Ulcer jejunum    Adenoma of left adrenal gland 02/18/2017   Abnormal TSH 08/30/2016   Bipolar disorder (HCC) 12/02/2015   HAV (hallux abducto valgus) 11/22/2015   Bunion of great toe 11/08/2015  Attention-deficit hyperactivity disorder, other type 10/28/2015   Sciatica 04/22/2015   B12 deficiency 12/01/2014   History of bariatric surgery 12/01/2014   Anxiety, generalized 12/01/2014   Gastro-esophageal reflux disease without esophagitis 12/01/2014   H/O elevated lipids 12/01/2014   H/O: HTN (hypertension) 12/01/2014   H/O: obesity 12/01/2014   Hodgkin's disease in remission ([place]) 12/01/2014   Current tobacco use 12/01/2014   Insomnia 03/24/2008   Abnormal serum level of alkaline phosphatase 11/08/2006   Chronic recurrent major depressive disorder ([place]) 11/08/2006    Past Surgical History:  Procedure Laterality  Date   ABDOMINAL HYSTERECTOMY     BUNIONECTOMY Right    CARPAL TUNNEL RELEASE     COLONOSCOPY     multiple   COLONOSCOPY WITH PROPOFOL N/A 11/20/2019   Procedure: COLONOSCOPY WITH PROPOFOL;  Surgeon: Lin Landsman, MD;  Location: [place];  Service: Gastroenterology;  Laterality: N/A;   ESOPHAGOGASTRODUODENOSCOPY (EGD) WITH PROPOFOL N/A 02/21/2017   Procedure: ESOPHAGOGASTRODUODENOSCOPY (EGD) WITH PROPOFOL;  Surgeon: Lucilla Lame, MD;  Location: [place];  Service: Gastroenterology;  Laterality: N/A;  Pre-diabetic   excision of neck mass  09/1998   GASTRIC BYPASS  03/12/2011   LAPAROSCOPIC SUPRACERVICAL HYSTERECTOMY  2008   and unilateral salpingoophorectomy/ endometriosis-Dr Kincius   TONSILLECTOMY      Family History  Problem Relation Age of Onset   Cancer Mother 80       colon   Epilepsy Mother    Multiple myeloma Mother    Glaucoma Father    Breast cancer Maternal Grandmother 83       inflamitory breast ca    Social History   Tobacco Use   Smoking status: Every Day    Packs/day: 0.50    Years: 16.00    Pack years: 8.00    Types: Cigarettes, E-cigarettes    Start date: 06/11/2000    Last attempt to quit: 06/03/2020    Years since quitting: 0.5   Smokeless tobacco: Never   Tobacco comments:    Currently quitting, transitioned from cigarettes to e-cigarettes, started at 7 and is down to 3 mg   Substance Use Topics   Alcohol use: No    Alcohol/week: 0.0 standard drinks    Comment: rarely     Current Outpatient Medications:    Ascorbic Acid (VITAMIN C) 1000 MG tablet, Take 1,000 mg by mouth daily., Disp: , Rfl:    Cholecalciferol (VITAMIN D) 2000 units CAPS, Take 1 capsule (2,000 Units total) by mouth daily., Disp: 30 capsule, Rfl: 0   Dulaglutide (TRULICITY) 1.5 GL/8.7FI SOPN, INJECT 1.5 MG UNDER THE SKIN WEEKLY, Disp: 6 mL, Rfl: 1   hydrOXYzine (ATARAX/VISTARIL) 10 MG tablet, Take 1 tablet by mouth three times daily as needed, Disp: 270 tablet,  Rfl: 0   levothyroxine (SYNTHROID) 25 MCG tablet, TAKE 1 TABLET BY MOUTH ONCE DAILY IN THE MORNING ON AN EMPTY STOMACH, Disp: 90 tablet, Rfl: 1   Multiple Vitamins-Minerals (MULTI-VITAMIN GUMMIES) CHEW, Chew 2 capsules by mouth daily., Disp: 30 tablet, Rfl: 0   nystatin-triamcinolone ointment (MYCOLOG), APPLY 1 APPLICATION TOPICALLY 2 (TWO) TIMES DAILY., Disp: 30 g, Rfl: 0   atorvastatin (LIPITOR) 40 MG tablet, TAKE 1 TABLET BY MOUTH DAILY., Disp: 90 tablet, Rfl: 1   cyclobenzaprine (FLEXERIL) 5 MG tablet, TAKE 1 TABLET BY MOUTH AT BEDTIME AS NEEDED FOR MUSCLE SPASM, Disp: 90 tablet, Rfl: 1   DULoxetine (CYMBALTA) 60 MG capsule, TAKE 1 CAPSULE BY MOUTH DAILY., Disp: 90 capsule, Rfl: 1   gabapentin (NEURONTIN)  100 MG capsule, TAKE 2 CAPSULES (200 MG) BY MOUTH AT BEDTIME., Disp: 180 capsule, Rfl: 1   omeprazole (PRILOSEC) 40 MG capsule, TAKE 1 CAPSULE BY MOUTH EVERY DAY, Disp: 90 capsule, Rfl: 1   QUEtiapine (SEROQUEL) 200 MG tablet, TAKE 1 TABLET BY MOUTH AT BEDTIME., Disp: 90 tablet, Rfl: 1  Allergies  Allergen Reactions   Vicodin [Hydrocodone-Acetaminophen] Other (See Comments)    Has really bad headaches/ migraine      I personally reviewed active problem list, medication list, allergies, family history, social history, health maintenance with the patient/caregiver today.   ROS  Constitutional: Negative for fever , positive for  weight change.  Respiratory: Negative for cough and shortness of breath.   Cardiovascular: Negative for chest pain or palpitations.  Gastrointestinal: Negative for abdominal pain, no bowel changes.  Musculoskeletal: Negative for gait problem or joint swelling.  Skin: Negative for rash.  Neurological: Negative for dizziness or headache.  No other specific complaints in a complete review of systems (except as listed in HPI above).   Objective  Vitals:   01/03/21 0838  BP: 122/76  Pulse: 87  Resp: 16  Temp: 98.5 F (36.9 C)  TempSrc: Oral  SpO2: 98%   Weight: 154 lb (69.9 kg)  Height: _0  (1.651 m)    Body mass index is 25.63 kg/m.  Physical Exam  Constitutional: Patient appears well-developed and well-nourished.   No distress.  HEENT: head atraumatic, normocephalic, pupils equal and reactive to light,neck supple Cardiovascular: Normal rate, regular rhythm and normal heart sounds.  No murmur heard. No BLE edema. Pulmonary/Chest: Effort normal and breath sounds normal. No respiratory distress. Abdominal: Soft.  There is no tenderness. Psychiatric: Patient has a normal mood and affect. behavior is normal. Judgment and thought content normal.   Recent Results (from the past 2160 hour(s))  QuantiFERON-TB Gold Plus     Status: None   Collection Time: 12/02/20 11:54 AM  Result Value Ref Range   QuantiFERON-TB Gold Plus NEGATIVE NEGATIVE    Comment: Negative test result. M. tuberculosis complex  infection unlikely.    NIL 0.06 IU/mL   Mitogen-NIL >10.00 IU/mL   TB1-NIL 0.04 IU/mL   TB2-NIL 0.04 IU/mL    Comment: . The Nil tube value reflects the background interferon gamma immune response of the patient's blood sample. This value has been subtracted from the patient's displayed TB and Mitogen results. . Lower than expected results with the Mitogen tube prevent false-negative Quantiferon readings by detecting a patient with a potential immune suppressive condition and/or suboptimal pre-analytical specimen handling. . The TB1 Antigen tube is coated with the M. tuberculosis-specific antigens designed to elicit responses from TB antigen primed CD4+ helper T-lymphocytes. . The TB2 Antigen tube is coated with the M. tuberculosis-specific antigens designed to elicit responses from TB antigen primed CD4+ helper and CD8+ cytotoxic T-lymphocytes. . For additional information, please refer to https://education.quest[employer].com/faq/FAQ204 (This link is being provided for informational/ educational purposes only.) .       PHQ2/9: Depression screen [facility] 2/9 01/03/2021 08/05/2020 05/10/2020 11/23/2019 10/21/2019  Decreased Interest 0 1 1 0 0  Down, Depressed, Hopeless 0 1 1 0 0  PHQ - 2 Score 0 2 2 0 0  Altered sleeping 3 0 3 0 3  Tired, decreased energy _1 0 2  Change in appetite 0 0 3 0 0  Feeling bad or failure about yourself  0 0 0 0 0  Trouble concentrating 0 0 0 0 0  Moving slowly  or fidgety/restless 0 0 0 0 1  Suicidal thoughts 0 0 0 0 0  PHQ-9 Score _0 0 6  Difficult doing work/chores Not difficult at all Not difficult at all - - Somewhat difficult  Some recent data might be hidden    phq 9 is negative   Fall Risk: Fall Risk  01/03/2021 08/05/2020 05/10/2020 11/23/2019 10/21/2019  Falls in the past year? 0 0 0 0 0  Number falls in past yr: 0 0 0 0 0  Injury with Fall? 0 0 0 0 0  Follow up - - - - Falls evaluation completed      Functional Status Survey: Is the patient deaf or have difficulty hearing?: No Does the patient have difficulty seeing, even when wearing glasses/contacts?: No Does the patient have difficulty concentrating, remembering, or making decisions?: No Does the patient have difficulty walking or climbing stairs?: No Does the patient have difficulty dressing or bathing?: No Does the patient have difficulty doing errands alone such as visiting a doctor's office or shopping?: No    Assessment & Plan  1. GAD (generalized anxiety disorder)  - DULoxetine (CYMBALTA) 60 MG capsule; TAKE 1 CAPSULE BY MOUTH DAILY.  Dispense: 90 capsule; Refill: 1 - gabapentin (NEURONTIN) 100 MG capsule; TAKE 2 CAPSULES (200 MG) BY MOUTH AT BEDTIME.  Dispense: 180 capsule; Refill: 1 - QUEtiapine (SEROQUEL) 200 MG tablet; TAKE 1 TABLET BY MOUTH AT BEDTIME.  Dispense: 90 tablet; Refill: 1  2. Bipolar 1 disorder, mixed, moderate (HCC)  - DULoxetine (CYMBALTA) 60 MG capsule; TAKE 1 CAPSULE BY MOUTH DAILY.  Dispense: 90 capsule; Refill: 1 - gabapentin (NEURONTIN) 100 MG capsule; TAKE 2  CAPSULES (200 MG) BY MOUTH AT BEDTIME.  Dispense: 180 capsule; Refill: 1 - QUEtiapine (SEROQUEL) 200 MG tablet; TAKE 1 TABLET BY MOUTH AT BEDTIME.  Dispense: 90 tablet; Refill: 1  3. B12 deficiency  - Vitamin B12 - CBC with Differential/Platelet  4. Vitamin D deficiency  - VITAMIN D 25 Hydroxy (Vit-D Deficiency, Fractures)  5. History of peptic ulcer   6. Gastro-esophageal reflux disease without esophagitis  - omeprazole (PRILOSEC) 40 MG capsule; TAKE 1 CAPSULE BY MOUTH EVERY DAY  Dispense: 90 capsule; Refill: 1  7. Atherosclerosis of abdominal aorta (HCC)  - Lipid panel - atorvastatin (LIPITOR) 40 MG tablet; TAKE 1 TABLET BY MOUTH DAILY.  Dispense: 90 tablet; Refill: 1  8. Hodgkin's disease in remission ([place])   9. Insulin resistance  - Hemoglobin A1c  10. Recurrent low back pain  - cyclobenzaprine (FLEXERIL) 5 MG tablet; TAKE 1 TABLET BY MOUTH AT BEDTIME AS NEEDED FOR MUSCLE SPASM  Dispense: 90 tablet; Refill: 1  11. Adult hypothyroidism  - TSH  12. History of iron deficiency anemia  - CBC with Differential/Platelet - Iron, TIBC and Ferritin Panel  13. Long-term use of high-risk medication  - COMPLETE METABOLIC PANEL WITH GFR

## 2021-01-03 ENCOUNTER — Other Ambulatory Visit: Payer: Self-pay

## 2021-01-03 ENCOUNTER — Ambulatory Visit (INDEPENDENT_AMBULATORY_CARE_PROVIDER_SITE_OTHER): Payer: No Typology Code available for payment source | Admitting: Family Medicine

## 2021-01-03 ENCOUNTER — Encounter: Payer: Self-pay | Admitting: Family Medicine

## 2021-01-03 VITALS — BP 122/76 | HR 87 | Temp 98.5°F | Resp 16 | Ht 65.0 in | Wt 154.0 lb

## 2021-01-03 DIAGNOSIS — E559 Vitamin D deficiency, unspecified: Secondary | ICD-10-CM

## 2021-01-03 DIAGNOSIS — E039 Hypothyroidism, unspecified: Secondary | ICD-10-CM

## 2021-01-03 DIAGNOSIS — E538 Deficiency of other specified B group vitamins: Secondary | ICD-10-CM | POA: Diagnosis not present

## 2021-01-03 DIAGNOSIS — F411 Generalized anxiety disorder: Secondary | ICD-10-CM

## 2021-01-03 DIAGNOSIS — M545 Low back pain, unspecified: Secondary | ICD-10-CM

## 2021-01-03 DIAGNOSIS — Z79899 Other long term (current) drug therapy: Secondary | ICD-10-CM

## 2021-01-03 DIAGNOSIS — I700 Atherosclerosis of aorta: Secondary | ICD-10-CM

## 2021-01-03 DIAGNOSIS — F3162 Bipolar disorder, current episode mixed, moderate: Secondary | ICD-10-CM | POA: Diagnosis not present

## 2021-01-03 DIAGNOSIS — C8190 Hodgkin lymphoma, unspecified, unspecified site: Secondary | ICD-10-CM

## 2021-01-03 DIAGNOSIS — E8881 Metabolic syndrome: Secondary | ICD-10-CM

## 2021-01-03 DIAGNOSIS — K219 Gastro-esophageal reflux disease without esophagitis: Secondary | ICD-10-CM

## 2021-01-03 DIAGNOSIS — Z862 Personal history of diseases of the blood and blood-forming organs and certain disorders involving the immune mechanism: Secondary | ICD-10-CM

## 2021-01-03 DIAGNOSIS — Z8711 Personal history of peptic ulcer disease: Secondary | ICD-10-CM

## 2021-01-03 MED ORDER — GABAPENTIN 100 MG PO CAPS
ORAL_CAPSULE | ORAL | 1 refills | Status: DC
  Filled 2021-01-03: qty 180, fill #0
  Filled 2021-04-05: qty 180, 90d supply, fill #0

## 2021-01-03 MED ORDER — ATORVASTATIN CALCIUM 40 MG PO TABS
ORAL_TABLET | Freq: Every day | ORAL | 1 refills | Status: DC
Start: 2021-01-03 — End: 2021-07-12
  Filled 2021-01-03: qty 90, fill #0
  Filled 2021-04-05: qty 90, 90d supply, fill #0

## 2021-01-03 MED ORDER — QUETIAPINE FUMARATE 200 MG PO TABS
ORAL_TABLET | Freq: Every day | ORAL | 1 refills | Status: DC
Start: 2021-01-03 — End: 2021-07-12
  Filled 2021-01-03: qty 90, fill #0
  Filled 2021-01-21: qty 90, 90d supply, fill #0
  Filled 2021-04-05: qty 90, 90d supply, fill #1

## 2021-01-03 MED ORDER — CYCLOBENZAPRINE HCL 5 MG PO TABS
ORAL_TABLET | ORAL | 1 refills | Status: DC
  Filled 2021-01-03: qty 90, fill #0
  Filled 2021-04-05: qty 90, 90d supply, fill #0

## 2021-01-03 MED ORDER — DULOXETINE HCL 60 MG PO CPEP
ORAL_CAPSULE | Freq: Every day | ORAL | 1 refills | Status: DC
  Filled 2021-01-03: qty 90, fill #0
  Filled 2021-04-05: qty 90, 90d supply, fill #0

## 2021-01-03 MED ORDER — OMEPRAZOLE 40 MG PO CPDR
DELAYED_RELEASE_CAPSULE | ORAL | 1 refills | Status: DC
  Filled 2021-01-03: qty 90, 90d supply, fill #0

## 2021-01-04 LAB — COMPLETE METABOLIC PANEL WITH GFR
AG Ratio: 1.3 (calc) (ref 1.0–2.5)
ALT: 8 U/L (ref 6–29)
AST: 14 U/L (ref 10–35)
Albumin: 3.9 g/dL (ref 3.6–5.1)
Alkaline phosphatase (APISO): 220 U/L — ABNORMAL HIGH (ref 37–153)
BUN: 11 mg/dL (ref 7–25)
CO2: 29 mmol/L (ref 20–32)
Calcium: 9.2 mg/dL (ref 8.6–10.4)
Chloride: 104 mmol/L (ref 98–110)
Creat: 0.71 mg/dL (ref 0.50–1.03)
Globulin: 2.9 g/dL (calc) (ref 1.9–3.7)
Glucose, Bld: 80 mg/dL (ref 65–99)
Potassium: 4.4 mmol/L (ref 3.5–5.3)
Sodium: 141 mmol/L (ref 135–146)
Total Bilirubin: 0.7 mg/dL (ref 0.2–1.2)
Total Protein: 6.8 g/dL (ref 6.1–8.1)
eGFR: 102 mL/min/{1.73_m2} (ref >=60)

## 2021-01-04 LAB — VITAMIN B12: Vitamin B-12: 259 pg/mL (ref 200–1100)

## 2021-01-04 LAB — HEMOGLOBIN A1C
Hgb A1c MFr Bld: 5.5 % of total Hgb (ref <5.7)
Mean Plasma Glucose: 111 mg/dL
eAG (mmol/L): 6.2 mmol/L

## 2021-01-04 LAB — LIPID PANEL
Cholesterol: 146 mg/dL (ref <200)
HDL: 68 mg/dL (ref >=50)
LDL Cholesterol (Calc): 62 mg/dL (calc)
Non-HDL Cholesterol (Calc): 78 mg/dL (calc) (ref <130)
Total CHOL/HDL Ratio: 2.1 (calc) (ref <5.0)
Triglycerides: 77 mg/dL (ref <150)

## 2021-01-04 LAB — CBC WITH DIFFERENTIAL/PLATELET
Absolute Monocytes: 419 cells/uL (ref 200–950)
Basophils Absolute: 43 cells/uL (ref 0–200)
Basophils Relative: 0.6 %
Eosinophils Absolute: 256 cells/uL (ref 15–500)
Eosinophils Relative: 3.6 %
HCT: 38.3 % (ref 35.0–45.0)
Hemoglobin: 12.5 g/dL (ref 11.7–15.5)
Lymphs Abs: 2911 cells/uL (ref 850–3900)
MCH: 28.3 pg (ref 27.0–33.0)
MCHC: 32.6 g/dL (ref 32.0–36.0)
MCV: 86.8 fL (ref 80.0–100.0)
MPV: 9.4 fL (ref 7.5–12.5)
Monocytes Relative: 5.9 %
Neutro Abs: 3472 cells/uL (ref 1500–7800)
Neutrophils Relative %: 48.9 %
Platelets: 330 10*3/uL (ref 140–400)
RBC: 4.41 10*6/uL (ref 3.80–5.10)
RDW: 13.2 % (ref 11.0–15.0)
Total Lymphocyte: 41 %
WBC: 7.1 10*3/uL (ref 3.8–10.8)

## 2021-01-04 LAB — IRON,TIBC AND FERRITIN PANEL
%SAT: 31 % (calc) (ref 16–45)
Ferritin: 13 ng/mL — ABNORMAL LOW (ref 16–232)
Iron: 97 ug/dL (ref 45–160)
TIBC: 312 mcg/dL (calc) (ref 250–450)

## 2021-01-04 LAB — VITAMIN D 25 HYDROXY (VIT D DEFICIENCY, FRACTURES): Vit D, 25-Hydroxy: 37 ng/mL (ref 30–100)

## 2021-01-04 LAB — TSH: TSH: 2.48 mIU/L

## 2021-01-17 ENCOUNTER — Other Ambulatory Visit: Payer: Self-pay

## 2021-01-23 ENCOUNTER — Other Ambulatory Visit: Payer: Self-pay

## 2021-01-25 ENCOUNTER — Ambulatory Visit: Payer: No Typology Code available for payment source | Admitting: Dermatology

## 2021-04-05 ENCOUNTER — Other Ambulatory Visit: Payer: Self-pay | Admitting: Family Medicine

## 2021-04-05 ENCOUNTER — Other Ambulatory Visit: Payer: Self-pay

## 2021-04-05 DIAGNOSIS — E038 Other specified hypothyroidism: Secondary | ICD-10-CM

## 2021-04-06 ENCOUNTER — Other Ambulatory Visit: Payer: Self-pay

## 2021-04-06 MED FILL — Levothyroxine Sodium Tab 25 MCG: ORAL | 90 days supply | Qty: 90 | Fill #0 | Status: AC

## 2021-04-07 ENCOUNTER — Other Ambulatory Visit: Payer: Self-pay

## 2021-04-13 ENCOUNTER — Other Ambulatory Visit: Payer: Self-pay

## 2021-06-02 NOTE — Progress Notes (Deleted)
Name: Jessica [name]   MRN: [id number]    DOB: 1965/07/11   Date:06/02/2021       Progress Note  Subjective  Chief Complaint  Follow Up  HPI  History of obesity: she had bariatric surgery 03/2011, maximum weight prior to surgery was 270 lbs, she went down to 142 lbs and was stable for many years, however when she was  diagnosed with bipolar in 2017 and was placed on antipsychotic medications she started to gain weight, March 2018 weight was 183 lbs. She was on Levothyroxine and Metformin and went down to 165 lbs in May 2018 and we started her on Trulicity to help curb appetite and help with pre-diabetes/insulin resistance and she was  below her goal weight ( it was 140 lbs), and eventually down  to 129 lbs, but she was having  nausea and epigastric pain, she finally saw GI came off Nsaid's and was given carafate, omeprazole for treatment for jejunal ulcer. She felt better with medication and gained some weight. Her weight at home on 02/10/2020 was 168 lbs she joined weight watchers with her sister, cut down on sweets and carbs and was doing great, however in October 2021 she developed worsening of weight loss and epigastric pain, she was worried the peptic ulcer was back, she went back on carafate and pain resolved, able to eat again and has gained some weight back., today her weight is 154 lbs, states no longer vomiting or having epigastric , she would like to keep her weight around 150 lbs    Pre-diabetes:hgbA1C was back in 2013 was 6.3%, but down to 5.3 % with weight loss , she was gradually gaining weight after bariatric surgery ( 2012 )  and we started her on Metformin 08/2016 , followed by Trulicity but we stopped medication when she lost a lot of weight in 2021, but she has been back since early 2022 and weight is good at this time     B12 deficiency: not currently getting B12 injections , she is also not using SL B12, explained importance of resuming supplementation    Adult hypothyroidism:  : associated with fatigue, and mild weight gain, started on Levothyroxine Fall 2020 . She has noticed hair loss ( since COVID-19 back Feb), we will recheck labs today    Hodgkin's lymphoma:she went back to see Dr. Tasia Catchings in 2019 last CBC was normal, iron storage was low but she was not eating at the time, stomach pain resolved and she has gained weight. We will continue to monitor   Bipolar: no longer rseeing Dr.Eappen because of cost , last visit was 01/2019,she has insurance again, but prefers not going back yet, she has been more anxious lately, worried about her mother, she was diagnosed with Multiple Myeloma, she is still feeling anxious, not sleeping well, discussed at least to go for therapy and she is considering it   Atherosclerosis of aorta: she is on Atorvastatin and denies side effects, we will recheck labs today, no side effects of medications    Patient Active Problem List   Diagnosis Date Noted   Iron deficiency anemia 01/09/2018   Cervical high risk HPV (human papillomavirus) test positive 04/08/2017   Abdominal pain, epigastric    Bariatric surgery status    Ulcer jejunum    Adenoma of left adrenal gland 02/18/2017   Abnormal TSH 08/30/2016   Bipolar disorder ([place]) 12/02/2015   HAV (hallux abducto valgus) 11/22/2015   Bunion of great toe 11/08/2015   Attention-deficit  hyperactivity disorder, other type 10/28/2015   Sciatica 04/22/2015   B12 deficiency 12/01/2014   History of bariatric surgery 12/01/2014   Anxiety, generalized 12/01/2014   Gastro-esophageal reflux disease without esophagitis 12/01/2014   H/O elevated lipids 12/01/2014   H/O: HTN (hypertension) 12/01/2014   H/O: obesity 12/01/2014   Hodgkin's disease in remission ([place]) 12/01/2014   Current tobacco use 12/01/2014   Insomnia 03/24/2008   Abnormal serum level of alkaline phosphatase 11/08/2006   Chronic recurrent major depressive disorder ([place]) 11/08/2006    Past Surgical History:  Procedure Laterality  Date   ABDOMINAL HYSTERECTOMY     BUNIONECTOMY Right    CARPAL TUNNEL RELEASE     COLONOSCOPY     multiple   COLONOSCOPY WITH PROPOFOL N/A 11/20/2019   Procedure: COLONOSCOPY WITH PROPOFOL;  Surgeon: Lin Landsman, MD;  Location: [place];  Service: Gastroenterology;  Laterality: N/A;   ESOPHAGOGASTRODUODENOSCOPY (EGD) WITH PROPOFOL N/A 02/21/2017   Procedure: ESOPHAGOGASTRODUODENOSCOPY (EGD) WITH PROPOFOL;  Surgeon: Lucilla Lame, MD;  Location: [place];  Service: Gastroenterology;  Laterality: N/A;  Pre-diabetic   excision of neck mass  09/1998   GASTRIC BYPASS  03/12/2011   LAPAROSCOPIC SUPRACERVICAL HYSTERECTOMY  2008   and unilateral salpingoophorectomy/ endometriosis-Dr Kincius   TONSILLECTOMY      Family History  Problem Relation Age of Onset   Cancer Mother 46       colon   Epilepsy Mother    Multiple myeloma Mother    Glaucoma Father    Breast cancer Maternal Grandmother 83       inflamitory breast ca    Social History   Tobacco Use   Smoking status: Every Day    Packs/day: 0.50    Years: 16.00    Pack years: 8.00    Types: Cigarettes, E-cigarettes    Start date: 06/11/2000    Last attempt to quit: 06/03/2020    Years since quitting: 0.9   Smokeless tobacco: Never   Tobacco comments:    Currently quitting, transitioned from cigarettes to e-cigarettes, started at 7 and is down to 3 mg   Substance Use Topics   Alcohol use: No    Alcohol/week: 0.0 standard drinks    Comment: rarely     Current Outpatient Medications:    Ascorbic Acid (VITAMIN C) 1000 MG tablet, Take 1,000 mg by mouth daily., Disp: , Rfl:    atorvastatin (LIPITOR) 40 MG tablet, TAKE 1 TABLET BY MOUTH DAILY., Disp: 90 tablet, Rfl: 1   Cholecalciferol (VITAMIN D) 2000 units CAPS, Take 1 capsule (2,000 Units total) by mouth daily., Disp: 30 capsule, Rfl: 0   cyclobenzaprine (FLEXERIL) 5 MG tablet, TAKE 1 TABLET BY MOUTH AT BEDTIME AS NEEDED FOR MUSCLE SPASM, Disp: 90 tablet,  Rfl: 1   Dulaglutide (TRULICITY) 1.5 LT/6.4HD SOPN, INJECT 1.5 MG UNDER THE SKIN WEEKLY, Disp: 6 mL, Rfl: 1   DULoxetine (CYMBALTA) 60 MG capsule, TAKE 1 CAPSULE BY MOUTH DAILY., Disp: 90 capsule, Rfl: 1   gabapentin (NEURONTIN) 100 MG capsule, TAKE 2 CAPSULES (200 MG) BY MOUTH AT BEDTIME., Disp: 180 capsule, Rfl: 1   hydrOXYzine (ATARAX/VISTARIL) 10 MG tablet, Take 1 tablet by mouth three times daily as needed, Disp: 270 tablet, Rfl: 0   levothyroxine (SYNTHROID) 25 MCG tablet, TAKE 1 TABLET BY MOUTH ONCE DAILY IN THE MORNING ON AN EMPTY STOMACH, Disp: 90 tablet, Rfl: 0   Multiple Vitamins-Minerals (MULTI-VITAMIN GUMMIES) CHEW, Chew 2 capsules by mouth daily., Disp: 30 tablet, Rfl: 0  nystatin-triamcinolone ointment (MYCOLOG), APPLY 1 APPLICATION TOPICALLY 2 (TWO) TIMES DAILY., Disp: 30 g, Rfl: 0   omeprazole (PRILOSEC) 40 MG capsule, TAKE 1 CAPSULE BY MOUTH EVERY DAY, Disp: 90 capsule, Rfl: 1   QUEtiapine (SEROQUEL) 200 MG tablet, TAKE 1 TABLET BY MOUTH AT BEDTIME., Disp: 90 tablet, Rfl: 1  Allergies  Allergen Reactions   Vicodin [Hydrocodone-Acetaminophen] Other (See Comments)    Has really bad headaches/ migraine      I personally reviewed active problem list, medication list, allergies, family history, social history, health maintenance with the patient/caregiver today.   ROS  ***  Objective  There were no vitals filed for this visit.  There is no height or weight on file to calculate BMI.  Physical Exam ***  No results found for this or any previous visit (from the past 2160 hour(s)).   PHQ2/9: Depression screen [facility] 2/9 01/03/2021 08/05/2020 05/10/2020 11/23/2019 10/21/2019  Decreased Interest 0 1 1 0 0  Down, Depressed, Hopeless 0 1 1 0 0  PHQ - 2 Score 0 2 2 0 0  Altered sleeping 3 0 3 0 3  Tired, decreased energy _0 0 2  Change in appetite 0 0 3 0 0  Feeling bad or failure about yourself  0 0 0 0 0  Trouble concentrating 0 0 0 0 0  Moving slowly or  fidgety/restless 0 0 0 0 1  Suicidal thoughts 0 0 0 0 0  PHQ-9 Score _1 0 6  Difficult doing work/chores Not difficult at all Not difficult at all - - Somewhat difficult  Some recent data might be hidden    phq 9 is {gen pos [id number]}   Fall Risk: Fall Risk  01/03/2021 08/05/2020 05/10/2020 11/23/2019 10/21/2019  Falls in the past year? 0 0 0 0 0  Number falls in past yr: 0 0 0 0 0  Injury with Fall? 0 0 0 0 0  Follow up - - - - Falls evaluation completed      Functional Status Survey:      Assessment & Plan  *** There are no diagnoses linked to this encounter.

## 2021-06-06 ENCOUNTER — Ambulatory Visit: Payer: No Typology Code available for payment source | Admitting: Family Medicine

## 2021-07-05 ENCOUNTER — Encounter: Payer: Self-pay | Admitting: Oncology

## 2021-07-11 NOTE — Progress Notes (Signed)
Name: Jessica [name]   MRN: [id number]    DOB: 22-May-1966   Date:07/12/2021       Progress Note  Subjective  Chief Complaint  Follow Up  HPI  History of obesity: she had bariatric surgery 03/2011, maximum weight prior to surgery was 270 lbs, she went down to 142 lbs and was stable for many years, however when she was  diagnosed with bipolar in 2017 and was placed on antipsychotic medications she started to gain weight, March 2018 weight was 183 lbs. She was on Levothyroxine and Metformin and went down to 165 lbs in May 2018 and we started her on Trulicity to help curb appetite and help with pre-diabetes/insulin resistance and she was  below her goal weight ( it was 140 lbs), and eventually down  to 129 lbs, but she was having  nausea and epigastric pain, she finally saw GI came off Nsaid's and was given carafate, omeprazole for treatment for jejunal ulcer. She felt better with medication and gained some weight. Her weight at home on 02/10/2020 was 168 lbs she joined weight watchers with her sister, cut down on sweets and carbs and was doing great, however in October 2021 she developed worsening of weight loss and epigastric pain, she was worried the peptic ulcer was back, she went back on carafate and pain resolved., weight back in July was  154 lbs, states no longer vomiting or having epigastric , today it was 169 lbs she states appetite is good, no abdominal pain and has been eating more .    Pre-diabetes:hgbA1C was back in 2013 was 6.3%, but down to 5.3 % with weight loss , she was gradually gaining weight after bariatric surgery ( 2012 )  and we started her on Metformin 08/2016 , followed by Trulicity but we stopped medication when she lost a lot of weight in 2021, but she has been back since early 2022 , weight is trending up again , she states appetite has been good    B12 deficiency: not currently getting B12 injections , she is back on SL B12, we will recheck labs next visit and give her one  shot today    Adult hypothyroidism: : associated with fatigue, and mild weight gain, started on Levothyroxine Fall 2020 ., TSH at goal in July.    Hodgkin's lymphoma:she went back to see Dr. Tasia Catchings in 2019 , last CBC was normal, iron storage was low , she is now taking supplementation she wants to hold off until next visit to recheck labs   Bipolar: no longer seeing Dr.Eappen because of cost , last visit was 01/2019,she has insurance again, but prefers not going back yet, she has been more anxious lately, mother was buried this week - died complications of bone cancer, she has not been sleeping well and cries/grieving   Atherosclerosis of aorta: she is on Atorvastatin and denies side effects, last LDL at goal   Muscle Spasms: low back, she takes Flexeril prn    Patient Active Problem List   Diagnosis Date Noted   Iron deficiency anemia 01/09/2018   Cervical high risk HPV (human papillomavirus) test positive 04/08/2017   Abdominal pain, epigastric    Bariatric surgery status    Ulcer jejunum    Adenoma of left adrenal gland 02/18/2017   Abnormal TSH 08/30/2016   Bipolar disorder ([place]) 12/02/2015   HAV (hallux abducto valgus) 11/22/2015   Bunion of great toe 11/08/2015   Attention-deficit hyperactivity disorder, other type 10/28/2015   Sciatica  04/22/2015   B12 deficiency 12/01/2014   History of bariatric surgery 12/01/2014   Anxiety, generalized 12/01/2014   Gastro-esophageal reflux disease without esophagitis 12/01/2014   H/O elevated lipids 12/01/2014   H/O: HTN (hypertension) 12/01/2014   H/O: obesity 12/01/2014   Hodgkin's disease in remission ([place]) 12/01/2014   Current tobacco use 12/01/2014   Insomnia 03/24/2008   Abnormal serum level of alkaline phosphatase 11/08/2006   Chronic recurrent major depressive disorder ([place]) 11/08/2006    Past Surgical History:  Procedure Laterality Date   ABDOMINAL HYSTERECTOMY     BUNIONECTOMY Right    CARPAL TUNNEL RELEASE     COLONOSCOPY      multiple   COLONOSCOPY WITH PROPOFOL N/A 11/20/2019   Procedure: COLONOSCOPY WITH PROPOFOL;  Surgeon: Lin Landsman, MD;  Location: [facility];  Service: Gastroenterology;  Laterality: N/A;   ESOPHAGOGASTRODUODENOSCOPY (EGD) WITH PROPOFOL N/A 02/21/2017   Procedure: ESOPHAGOGASTRODUODENOSCOPY (EGD) WITH PROPOFOL;  Surgeon: Lucilla Lame, MD;  Location: [facility];  Service: Gastroenterology;  Laterality: N/A;  Pre-diabetic   excision of neck mass  09/1998   GASTRIC BYPASS  03/12/2011   LAPAROSCOPIC SUPRACERVICAL HYSTERECTOMY  2008   and unilateral salpingoophorectomy/ endometriosis-Dr Kincius   TONSILLECTOMY      Family History  Problem Relation Age of Onset   Cancer Mother 2       colon   Epilepsy Mother    Multiple myeloma Mother    Glaucoma Father    Breast cancer Maternal Grandmother 83       inflamitory breast ca    Social History   Tobacco Use   Smoking status: Every Day    Packs/day: 0.50    Years: 16.00    Pack years: 8.00    Types: Cigarettes, E-cigarettes    Start date: 06/11/2000    Last attempt to quit: 06/03/2020    Years since quitting: 1.1   Smokeless tobacco: Never   Tobacco comments:    Currently quitting, transitioned from cigarettes to e-cigarettes, started at 7 and is down to 3 mg   Substance Use Topics   Alcohol use: No    Alcohol/week: 0.0 standard drinks    Comment: rarely     Current Outpatient Medications:    Ascorbic Acid (VITAMIN C) 1000 MG tablet, Take 1,000 mg by mouth daily., Disp: , Rfl:    atorvastatin (LIPITOR) 40 MG tablet, TAKE 1 TABLET BY MOUTH DAILY., Disp: 90 tablet, Rfl: 1   Cholecalciferol (VITAMIN D) 2000 units CAPS, Take 1 capsule (2,000 Units total) by mouth daily., Disp: 30 capsule, Rfl: 0   cyclobenzaprine (FLEXERIL) 5 MG tablet, TAKE 1 TABLET BY MOUTH AT BEDTIME AS NEEDED FOR MUSCLE SPASM, Disp: 90 tablet, Rfl: 1   Dulaglutide (TRULICITY) 1.5 TL/5.7WI SOPN, INJECT 1.5 MG UNDER THE SKIN WEEKLY, Disp: 6  mL, Rfl: 1   DULoxetine (CYMBALTA) 60 MG capsule, TAKE 1 CAPSULE BY MOUTH DAILY., Disp: 90 capsule, Rfl: 1   gabapentin (NEURONTIN) 100 MG capsule, TAKE 2 CAPSULES (200 MG) BY MOUTH AT BEDTIME., Disp: 180 capsule, Rfl: 1   hydrOXYzine (ATARAX/VISTARIL) 10 MG tablet, Take 1 tablet by mouth three times daily as needed, Disp: 270 tablet, Rfl: 0   levothyroxine (SYNTHROID) 25 MCG tablet, TAKE 1 TABLET BY MOUTH ONCE DAILY IN THE MORNING ON AN EMPTY STOMACH, Disp: 90 tablet, Rfl: 0   Multiple Vitamins-Minerals (MULTI-VITAMIN GUMMIES) CHEW, Chew 2 capsules by mouth daily., Disp: 30 tablet, Rfl: 0   nystatin-triamcinolone ointment (MYCOLOG), APPLY 1 APPLICATION TOPICALLY 2 (  TWO) TIMES DAILY., Disp: 30 g, Rfl: 0   omeprazole (PRILOSEC) 40 MG capsule, TAKE 1 CAPSULE BY MOUTH EVERY DAY, Disp: 90 capsule, Rfl: 1   QUEtiapine (SEROQUEL) 200 MG tablet, TAKE 1 TABLET BY MOUTH AT BEDTIME., Disp: 90 tablet, Rfl: 1  Allergies  Allergen Reactions   Vicodin [Hydrocodone-Acetaminophen] Other (See Comments)    Has really bad headaches/ migraine      I personally reviewed active problem list, medication list, allergies, family history, social history, health maintenance with the patient/caregiver today.   ROS  Constitutional: Negative for fever , positive for weight change.  Respiratory: Negative for cough and shortness of breath.   Cardiovascular: Negative for chest pain or palpitations.  Gastrointestinal: Negative for abdominal pain, no bowel changes.  Musculoskeletal: Negative for gait problem or joint swelling.  Skin: Negative for rash.  Neurological: Negative for dizziness or headache.  No other specific complaints in a complete review of systems (except as listed in HPI above).   Objective  Vitals:   07/12/21 1350  BP: 122/78  Pulse: 93  Resp: 16  SpO2: 98%  Weight: 169 lb (76.7 kg)  Height: $Remove'5\' 5"'PzjVCuO$  (1.651 m)    Body mass index is 28.12 kg/m.  Physical Exam  Constitutional: Patient  appears well-developed and well-nourished. Overweight.  No distress.  HEENT: head atraumatic, normocephalic, pupils equal and reactive to light, neck supple Cardiovascular: Normal rate, regular rhythm and normal heart sounds.  No murmur heard. No BLE edema. Pulmonary/Chest: Effort normal and breath sounds normal. No respiratory distress. Abdominal: Soft.  There is no tenderness. Psychiatric: Patient has a normal mood and affect. behavior is normal. Judgment and thought content normal.   PHQ2/9: Depression screen [facility] 2/9 07/12/2021 01/03/2021 08/05/2020 05/10/2020 11/23/2019  Decreased Interest 1 0 1 1 0  Down, Depressed, Hopeless 1 0 1 1 0  PHQ - 2 Score 2 0 2 2 0  Altered sleeping 1 3 0 3 0  Tired, decreased energy $RemoveBeforeDE'1 3 1 2 'epZszcOhrLwCtZJ$ 0  Change in appetite 0 0 0 3 0  Feeling bad or failure about yourself  0 0 0 0 0  Trouble concentrating 0 0 0 0 0  Moving slowly or fidgety/restless 0 0 0 0 0  Suicidal thoughts 0 0 0 0 0  PHQ-9 Score $RemoveBef'4 6 3 10 'QogBbUituh$ 0  Difficult doing work/chores - Not difficult at all Not difficult at all - -  Some recent data might be hidden    phq 9 is positive   Fall Risk: Fall Risk  07/12/2021 01/03/2021 08/05/2020 05/10/2020 11/23/2019  Falls in the past year? 0 0 0 0 0  Number falls in past yr: 0 0 0 0 0  Injury with Fall? 0 0 0 0 0  Risk for fall due to : No Fall Risks - - - -  Follow up Falls prevention discussed - - - -      Functional Status Survey: Is the patient deaf or have difficulty hearing?: No Does the patient have difficulty seeing, even when wearing glasses/contacts?: No Does the patient have difficulty concentrating, remembering, or making decisions?: No Does the patient have difficulty walking or climbing stairs?: No Does the patient have difficulty dressing or bathing?: No Does the patient have difficulty doing errands alone such as visiting a doctor's office or shopping?: No    Assessment & Plan  1. GAD (generalized anxiety disorder)  - DULoxetine  (CYMBALTA) 60 MG capsule; TAKE 1 CAPSULE BY MOUTH DAILY.  Dispense: 90 capsule;  Refill: 1 - gabapentin (NEURONTIN) 100 MG capsule; TAKE 2 CAPSULES (200 MG) BY MOUTH AT BEDTIME.  Dispense: 180 capsule; Refill: 1 - QUEtiapine (SEROQUEL) 200 MG tablet; TAKE 1 TABLET BY MOUTH AT BEDTIME.  Dispense: 90 tablet; Refill: 1  2. Bipolar 1 disorder, mixed, moderate (HCC)  - DULoxetine (CYMBALTA) 60 MG capsule; TAKE 1 CAPSULE BY MOUTH DAILY.  Dispense: 90 capsule; Refill: 1 - gabapentin (NEURONTIN) 100 MG capsule; TAKE 2 CAPSULES (200 MG) BY MOUTH AT BEDTIME.  Dispense: 180 capsule; Refill: 1 - QUEtiapine (SEROQUEL) 200 MG tablet; TAKE 1 TABLET BY MOUTH AT BEDTIME.  Dispense: 90 tablet; Refill: 1  3. B12 deficiency  - cyanocobalamin ((VITAMIN B-12)) injection 1,000 mcg  4. Gastro-esophageal reflux disease without esophagitis  - omeprazole (PRILOSEC) 40 MG capsule; TAKE 1 CAPSULE BY MOUTH EVERY DAY  Dispense: 90 capsule; Refill: 1  5. History of peptic ulcer   6. Atherosclerosis of abdominal aorta (HCC)  - atorvastatin (LIPITOR) 40 MG tablet; TAKE 1 TABLET BY MOUTH DAILY.  Dispense: 90 tablet; Refill: 1  7. Hodgkin's disease in remission ([place])   8. Insulin resistance  - Dulaglutide (TRULICITY) 3 UG/8.1CW SOPN; Inject 3 mg as directed once a week.  Dispense: 6 mL; Refill: 1  9. Adult hypothyroidism   10. Long-term use of high-risk medication   11. Vitamin D deficiency   12. Iron deficiency   13. History of bariatric surgery   14. Recurrent low back pain  - cyclobenzaprine (FLEXERIL) 5 MG tablet; TAKE 1 TABLET BY MOUTH AT BEDTIME AS NEEDED FOR MUSCLE SPASM  Dispense: 90 tablet; Refill: 1  15. Abnormal urine odor  - CULTURE, URINE COMPREHENSIVE

## 2021-07-12 ENCOUNTER — Encounter: Payer: Self-pay | Admitting: Family Medicine

## 2021-07-12 ENCOUNTER — Other Ambulatory Visit: Payer: Self-pay

## 2021-07-12 ENCOUNTER — Ambulatory Visit (INDEPENDENT_AMBULATORY_CARE_PROVIDER_SITE_OTHER): Payer: BC Managed Care – PPO | Admitting: Family Medicine

## 2021-07-12 ENCOUNTER — Telehealth: Payer: Self-pay

## 2021-07-12 ENCOUNTER — Encounter: Payer: Self-pay | Admitting: Oncology

## 2021-07-12 VITALS — BP 122/78 | HR 93 | Resp 16 | Ht 65.0 in | Wt 169.0 lb

## 2021-07-12 DIAGNOSIS — E039 Hypothyroidism, unspecified: Secondary | ICD-10-CM

## 2021-07-12 DIAGNOSIS — K219 Gastro-esophageal reflux disease without esophagitis: Secondary | ICD-10-CM

## 2021-07-12 DIAGNOSIS — C8190 Hodgkin lymphoma, unspecified, unspecified site: Secondary | ICD-10-CM

## 2021-07-12 DIAGNOSIS — Z79899 Other long term (current) drug therapy: Secondary | ICD-10-CM

## 2021-07-12 DIAGNOSIS — F411 Generalized anxiety disorder: Secondary | ICD-10-CM

## 2021-07-12 DIAGNOSIS — I700 Atherosclerosis of aorta: Secondary | ICD-10-CM

## 2021-07-12 DIAGNOSIS — M545 Low back pain, unspecified: Secondary | ICD-10-CM

## 2021-07-12 DIAGNOSIS — E8881 Metabolic syndrome: Secondary | ICD-10-CM

## 2021-07-12 DIAGNOSIS — Z1211 Encounter for screening for malignant neoplasm of colon: Secondary | ICD-10-CM

## 2021-07-12 DIAGNOSIS — F3162 Bipolar disorder, current episode mixed, moderate: Secondary | ICD-10-CM | POA: Diagnosis not present

## 2021-07-12 DIAGNOSIS — E538 Deficiency of other specified B group vitamins: Secondary | ICD-10-CM

## 2021-07-12 DIAGNOSIS — Z8711 Personal history of peptic ulcer disease: Secondary | ICD-10-CM

## 2021-07-12 DIAGNOSIS — E611 Iron deficiency: Secondary | ICD-10-CM

## 2021-07-12 DIAGNOSIS — E559 Vitamin D deficiency, unspecified: Secondary | ICD-10-CM

## 2021-07-12 DIAGNOSIS — R8290 Unspecified abnormal findings in urine: Secondary | ICD-10-CM

## 2021-07-12 DIAGNOSIS — Z9884 Bariatric surgery status: Secondary | ICD-10-CM

## 2021-07-12 DIAGNOSIS — Z1231 Encounter for screening mammogram for malignant neoplasm of breast: Secondary | ICD-10-CM

## 2021-07-12 MED ORDER — CYCLOBENZAPRINE HCL 5 MG PO TABS
ORAL_TABLET | ORAL | 1 refills | Status: DC
  Filled 2021-07-12: qty 90, 90d supply, fill #0
  Filled 2021-09-27: qty 90, 90d supply, fill #1

## 2021-07-12 MED ORDER — ATORVASTATIN CALCIUM 40 MG PO TABS
ORAL_TABLET | Freq: Every day | ORAL | 1 refills | Status: DC
  Filled 2021-07-12: qty 90, 90d supply, fill #0
  Filled 2021-09-27: qty 90, 90d supply, fill #1

## 2021-07-12 MED ORDER — GABAPENTIN 100 MG PO CAPS
ORAL_CAPSULE | ORAL | 1 refills | Status: DC
  Filled 2021-07-12: qty 180, 90d supply, fill #0
  Filled 2021-09-27: qty 180, 90d supply, fill #1

## 2021-07-12 MED ORDER — CYANOCOBALAMIN 1000 MCG/ML IJ SOLN
1000.0000 ug | Freq: Once | INTRAMUSCULAR | Status: AC
Start: 2021-07-12 — End: 2021-07-12
  Administered 2021-07-12: 1000 ug via INTRAMUSCULAR

## 2021-07-12 MED ORDER — LEVOTHYROXINE SODIUM 25 MCG PO TABS
25.0000 ug | ORAL_TABLET | Freq: Every day | ORAL | 1 refills | Status: DC
Start: 2021-07-12 — End: 2022-03-05
  Filled 2021-07-12: qty 90, 90d supply, fill #0
  Filled 2021-09-27 – 2021-11-30 (×2): qty 90, 90d supply, fill #1

## 2021-07-12 MED ORDER — OMEPRAZOLE 40 MG PO CPDR
DELAYED_RELEASE_CAPSULE | ORAL | 1 refills | Status: DC
  Filled 2021-07-12: qty 90, 90d supply, fill #0
  Filled 2021-12-01: qty 90, 90d supply, fill #1

## 2021-07-12 MED ORDER — QUETIAPINE FUMARATE 200 MG PO TABS
ORAL_TABLET | Freq: Every day | ORAL | 1 refills | Status: DC
  Filled 2021-07-12: qty 90, 90d supply, fill #0
  Filled 2021-09-27: qty 90, 90d supply, fill #1

## 2021-07-12 MED ORDER — TRULICITY 3 MG/0.5ML ~~LOC~~ SOAJ
3.0000 mg | SUBCUTANEOUS | 1 refills | Status: DC
  Filled 2021-07-12: qty 2, 28d supply, fill #0
  Filled 2021-09-27: qty 2, 28d supply, fill #1
  Filled 2021-11-30: qty 2, 28d supply, fill #2
  Filled 2022-01-03: qty 2, 28d supply, fill #3

## 2021-07-12 MED ORDER — DULOXETINE HCL 60 MG PO CPEP
ORAL_CAPSULE | Freq: Every day | ORAL | 1 refills | Status: DC
  Filled 2021-07-12: qty 90, 90d supply, fill #0
  Filled 2021-09-27: qty 90, 90d supply, fill #1

## 2021-07-12 MED ORDER — HYDROXYZINE HCL 10 MG PO TABS
ORAL_TABLET | ORAL | 0 refills | Status: DC
  Filled 2021-07-12: qty 270, 90d supply, fill #0

## 2021-07-12 NOTE — Telephone Encounter (Signed)
CALLED PATIENT NO ANSWER LEFT VOICEMAIL FOR A CALL BACK ? ?

## 2021-07-13 ENCOUNTER — Other Ambulatory Visit: Payer: Self-pay

## 2021-07-14 ENCOUNTER — Other Ambulatory Visit: Payer: Self-pay | Admitting: Family Medicine

## 2021-07-14 DIAGNOSIS — N3090 Cystitis, unspecified without hematuria: Secondary | ICD-10-CM

## 2021-07-14 LAB — CULTURE, URINE COMPREHENSIVE
MICRO NUMBER:: 12953288
SPECIMEN QUALITY:: ADEQUATE

## 2021-07-14 MED ORDER — SULFAMETHOXAZOLE-TRIMETHOPRIM 400-80 MG PO TABS: 1.0000 | ORAL_TABLET | Freq: Two times a day (BID) | ORAL | 0 refills | Status: DC

## 2021-09-27 ENCOUNTER — Other Ambulatory Visit: Payer: Self-pay | Admitting: Family Medicine

## 2021-09-27 ENCOUNTER — Other Ambulatory Visit: Payer: Self-pay

## 2021-09-27 DIAGNOSIS — F411 Generalized anxiety disorder: Secondary | ICD-10-CM

## 2021-09-28 ENCOUNTER — Other Ambulatory Visit: Payer: Self-pay

## 2021-09-29 ENCOUNTER — Other Ambulatory Visit: Payer: Self-pay

## 2021-10-02 ENCOUNTER — Other Ambulatory Visit: Payer: Self-pay

## 2021-10-03 ENCOUNTER — Other Ambulatory Visit: Payer: Self-pay

## 2021-10-06 ENCOUNTER — Other Ambulatory Visit: Payer: Self-pay

## 2021-10-13 ENCOUNTER — Other Ambulatory Visit: Payer: Self-pay

## 2021-10-17 ENCOUNTER — Other Ambulatory Visit: Payer: Self-pay

## 2021-11-23 NOTE — Patient Instructions (Incomplete)
Preventive Care 27-55 Years Old, Female Preventive care refers to lifestyle choices and visits with your health care provider that can promote health and wellness. Preventive care visits are also called wellness exams. What can I expect for my preventive care visit? Counseling Your health care provider may ask you questions about your: Medical history, including: Past medical problems. Family medical history. Pregnancy history. Current health, including: Menstrual cycle. Method of birth control. Emotional well-being. Home life and relationship well-being. Sexual activity and sexual health. Lifestyle, including: Alcohol, nicotine or tobacco, and drug use. Access to firearms. Diet, exercise, and sleep habits. Work and work Statistician. Sunscreen use. Safety issues such as seatbelt and bike helmet use. Physical exam Your health care provider will check your: Height and weight. These may be used to calculate your BMI (body mass index). BMI is a measurement that tells if you are at a healthy weight. Waist circumference. This measures the distance around your waistline. This measurement also tells if you are at a healthy weight and may help predict your risk of certain diseases, such as type 2 diabetes and high blood pressure. Heart rate and blood pressure. Body temperature. Skin for abnormal spots. What immunizations do I need?  Vaccines are usually given at various ages, according to a schedule. Your health care provider will recommend vaccines for you based on your age, medical history, and lifestyle or other factors, such as travel or where you work. What tests do I need? Screening Your health care provider may recommend screening tests for certain conditions. This may include: Lipid and cholesterol levels. Diabetes screening. This is done by checking your blood sugar (glucose) after you have not eaten for a while (fasting). Pelvic exam and Pap test. Hepatitis B test. Hepatitis C  test. HIV (human immunodeficiency virus) test. STI (sexually transmitted infection) testing, if you are at risk. Lung cancer screening. Colorectal cancer screening. Mammogram. Talk with your health care provider about when you should start having regular mammograms. This may depend on whether you have a family history of breast cancer. BRCA-related cancer screening. This may be done if you have a family history of breast, ovarian, tubal, or peritoneal cancers. Bone density scan. This is done to screen for osteoporosis. Talk with your health care provider about your test results, treatment options, and if necessary, the need for more tests. Follow these instructions at home: Eating and drinking  Eat a diet that includes fresh fruits and vegetables, whole grains, lean protein, and low-fat dairy products. Take vitamin and mineral supplements as recommended by your health care provider. Do not drink alcohol if: Your health care provider tells you not to drink. You are pregnant, may be pregnant, or are planning to become pregnant. If you drink alcohol: Limit how much you have to 0-1 drink a day. Know how much alcohol is in your drink. In the [place] one drink equals one 12 oz bottle of beer (355 mL), one 5 oz glass of wine (148 mL), or one 1 oz glass of hard liquor (44 mL). Lifestyle Brush your teeth every morning and night with fluoride toothpaste. Floss one time each day. Exercise for at least 30 minutes 5 or more days each week. Do not use any products that contain nicotine or tobacco. These products include cigarettes, chewing tobacco, and vaping devices, such as e-cigarettes. If you need help quitting, ask your health care provider. Do not use drugs. If you are sexually active, practice safe sex. Use a condom or other form of protection to  prevent STIs. If you do not wish to become pregnant, use a form of birth control. If you plan to become pregnant, see your health care provider for a  prepregnancy visit. Take aspirin only as told by your health care provider. Make sure that you understand how much to take and what form to take. Work with your health care provider to find out whether it is safe and beneficial for you to take aspirin daily. Find healthy ways to manage stress, such as: Meditation, yoga, or listening to music. Journaling. Talking to a trusted person. Spending time with friends and family. Minimize exposure to UV radiation to reduce your risk of skin cancer. Safety Always wear your seat belt while driving or riding in a vehicle. Do not drive: If you have been drinking alcohol. Do not ride with someone who has been drinking. When you are tired or distracted. While texting. If you have been using any mind-altering substances or drugs. Wear a helmet and other protective equipment during sports activities. If you have firearms in your house, make sure you follow all gun safety procedures. Seek help if you have been physically or sexually abused. What's next? Visit your health care provider once a year for an annual wellness visit. Ask your health care provider how often you should have your eyes and teeth checked. Stay up to date on all vaccines. This information is not intended to replace advice given to you by your health care provider. Make sure you discuss any questions you have with your health care provider. Document Revised: 11/23/2020 Document Reviewed: 11/23/2020 Elsevier Patient Education  [employer].

## 2021-11-23 NOTE — Progress Notes (Deleted)
Name: Jessica [name]   MRN: [id number]    DOB: [birth year]   Date:11/23/2021       Progress Note  Subjective  Chief Complaint  Annual Exam  HPI  Patient presents for annual CPE.  Diet: *** Exercise: ***   Flowsheet Row Office Visit from 08/05/2020 in [facility]  AUDIT-C Score 0      Depression: Phq 9 is  {Desc; negative/positive:13464}    07/12/2021    1:44 PM 01/03/2021    8:37 AM 08/05/2020    2:04 PM 05/10/2020    1:36 PM 11/23/2019    2:37 PM  Depression screen PHQ 2/9  Decreased Interest 1 0 1 1 0  Down, Depressed, Hopeless 1 0 1 1 0  PHQ - 2 Score 2 0 2 2 0  Altered sleeping 1 3 0 3 0  Tired, decreased energy _0 0  Change in appetite 0 0 0 3 0  Feeling bad or failure about yourself  0 0 0 0 0  Trouble concentrating 0 0 0 0 0  Moving slowly or fidgety/restless 0 0 0 0 0  Suicidal thoughts 0 0 0 0 0  PHQ-9 Score _1 0  Difficult doing work/chores  Not difficult at all Not difficult at all     Hypertension: BP Readings from Last 3 Encounters:  07/12/21 122/78  01/03/21 122/76  08/05/20 128/72   Obesity: Wt Readings from Last 3 Encounters:  07/12/21 169 lb (76.7 kg)  01/03/21 154 lb (69.9 kg)  08/05/20 143 lb 12.8 oz (65.2 kg)   BMI Readings from Last 3 Encounters:  07/12/21 28.12 kg/m  01/03/21 25.63 kg/m  08/05/20 23.93 kg/m     Vaccines:   HPV: N/A Tdap: up to date Shingrix: N/A Pneumonia: N/A Flu: up to date COVID-19: N/A   Hep C Screening: 11/23/19 STD testing and prevention (HIV/chl/gon/syphilis): N/A Intimate partner violence: negative screen  Sexual History : Menstrual History/LMP/Abnormal Bleeding:  Discussed importance of follow up if any post-menopausal bleeding: {Response; yes/no/na:63}  Incontinence Symptoms: {Desc; negative/positive:13464} for symptoms   Breast cancer:  - Last Mammogram: Ordered 07/12/21 - BRCA gene screening: N/A  Osteoporosis Prevention : Discussed high calcium and vitamin D  supplementation, weight bearing exercises Bone density :not applicable   Cervical cancer screening: Ordered today  Skin cancer: Discussed monitoring for atypical lesions  Colorectal cancer: Referral to GI placed today   Lung cancer:  Low Dose CT Chest recommended if Age 28-80 years, 20 pack-year currently smoking OR have quit w/in 15years. Patient does not qualify for screen   ECG: 10/23/21  Advanced Care Planning: A voluntary discussion about advance care planning including the explanation and discussion of advance directives.  Discussed health care proxy and Living will, and the patient was able to identify a health care proxy as ***.  Patient does not have a living will and power of attorney of health care   Lipids: Lab Results  Component Value Date   CHOL 146 01/03/2021   CHOL 127 11/23/2019   CHOL 108 02/03/2019   Lab Results  Component Value Date   HDL 68 01/03/2021   HDL 54 11/23/2019   HDL 48 (L) 02/03/2019   Lab Results  Component Value Date   LDLCALC 62 01/03/2021   LDLCALC 54 11/23/2019   LDLCALC 42 02/03/2019   Lab Results  Component Value Date   TRIG 77 01/03/2021   TRIG 100 11/23/2019   TRIG 98 02/03/2019  Lab Results  Component Value Date   CHOLHDL 2.1 01/03/2021   CHOLHDL 2.4 11/23/2019   CHOLHDL 2.3 02/03/2019   No results found for: "LDLDIRECT"  Glucose: Glucose  Date Value Ref Range Status  03/03/2014 87 65 - 99 mg/dL Final  07/08/2013 103 (H) 65 - 99 mg/dL Final  12/03/2012 88 65 - 99 mg/dL Final   Glucose, Bld  Date Value Ref Range Status  01/03/2021 80 65 - 99 mg/dL Final    Comment:    .            Fasting reference interval .   11/23/2019 79 65 - 99 mg/dL Final    Comment:    .            Fasting reference interval .   02/03/2019 82 65 - 99 mg/dL Final    Comment:    .            Fasting reference interval .     Patient Active Problem List   Diagnosis Date Noted   Iron deficiency anemia 01/09/2018   Cervical high  risk HPV (human papillomavirus) test positive 04/08/2017   Abdominal pain, epigastric    Bariatric surgery status    Ulcer jejunum    Adenoma of left adrenal gland 02/18/2017   Abnormal TSH 08/30/2016   Bipolar disorder ([place]) 12/02/2015   HAV (hallux abducto valgus) 11/22/2015   Bunion of great toe 11/08/2015   Attention-deficit hyperactivity disorder, other type 10/28/2015   Sciatica 04/22/2015   B12 deficiency 12/01/2014   History of bariatric surgery 12/01/2014   Anxiety, generalized 12/01/2014   Gastro-esophageal reflux disease without esophagitis 12/01/2014   H/O elevated lipids 12/01/2014   H/O: HTN (hypertension) 12/01/2014   H/O: obesity 12/01/2014   Hodgkin's disease in remission ([place]) 12/01/2014   Current tobacco use 12/01/2014   Insomnia 03/24/2008   Abnormal serum level of alkaline phosphatase 11/08/2006   Chronic recurrent major depressive disorder ([place]) 11/08/2006    Past Surgical History:  Procedure Laterality Date   ABDOMINAL HYSTERECTOMY     BUNIONECTOMY Right    CARPAL TUNNEL RELEASE     COLONOSCOPY     multiple   COLONOSCOPY WITH PROPOFOL N/A 11/20/2019   Procedure: COLONOSCOPY WITH PROPOFOL;  Surgeon: Lin Landsman, MD;  [facility] ENDOSCOPY;  Service: Gastroenterology;  Laterality: N/A;   ESOPHAGOGASTRODUODENOSCOPY (EGD) WITH PROPOFOL N/A 02/21/2017   Procedure: ESOPHAGOGASTRODUODENOSCOPY (EGD) WITH PROPOFOL;  Surgeon: Lucilla Lame, MD;  Location: [place];  Service: Gastroenterology;  Laterality: N/A;  Pre-diabetic   excision of neck mass  09/1998   GASTRIC BYPASS  03/12/2011   LAPAROSCOPIC SUPRACERVICAL HYSTERECTOMY  2008   and unilateral salpingoophorectomy/ endometriosis-Dr Kincius   TONSILLECTOMY      Family History  Problem Relation Age of Onset   Cancer Mother 23       colon   Epilepsy Mother    Multiple myeloma Mother    Glaucoma Father    Breast cancer Maternal Grandmother 83       inflamitory breast ca    Social  History   Socioeconomic History   Marital status: Married    Spouse name: Engineer, materials   Number of children: 2   Years of education: Not on file   Highest education level: Associate degree: occupational, Hotel manager, or vocational program  Occupational History   Occupation: house wife   Tobacco Use   Smoking status: Every Day    Packs/day: 0.50    Years: 16.00  Total pack years: 8.00    Types: Cigarettes, E-cigarettes    Start date: 06/11/2000    Last attempt to quit: 06/03/2020    Years since quitting: 1.4   Smokeless tobacco: Never   Tobacco comments:    Currently quitting, transitioned from cigarettes to e-cigarettes, started at 7 and is down to 3 mg   Vaping Use   Vaping Use: Some days  Substance and Sexual Activity   Alcohol use: No    Alcohol/week: 0.0 standard drinks of alcohol    Comment: rarely   Drug use: No   Sexual activity: Yes    Partners: Male    Birth control/protection: Surgical  Other Topics Concern   Not on file  Social History Narrative   Not on file   Social Determinants of Health   Financial Resource Strain: Low Risk  (11/23/2019)   Overall Financial Resource Strain (CARDIA)    Difficulty of Paying Living Expenses: Not hard at all  Food Insecurity: No Food Insecurity (11/23/2019)   Hunger Vital Sign    Worried About Running Out of Food in the Last Year: Never true    Ran Out of Food in the Last Year: Never true  Transportation Needs: No Transportation Needs (11/23/2019)   PRAPARE - Hydrologist (Medical): No    Lack of Transportation (Non-Medical): No  Physical Activity: Insufficiently Active (11/23/2019)   Exercise Vital Sign    Days of Exercise per Week: 3 days    Minutes of Exercise per Session: 20 min  Stress: Stress Concern Present (11/23/2019)   [place]    Feeling of Stress : Rather much  Social Connections: Socially Integrated (11/23/2019)   Social  Connection and Isolation Panel [NHANES]    Frequency of Communication with Friends and Family: More than three times a week    Frequency of Social Gatherings with Friends and Family: More than three times a week    Attends Religious Services: More than 4 times per year    Active Member of [employer] or Organizations: Yes    Attends Music therapist: More than 4 times per year    Marital Status: Married  Human resources officer Violence: Not At Risk (11/23/2019)   Humiliation, Afraid, Rape, and Kick questionnaire    Fear of Current or Ex-Partner: No    Emotionally Abused: No    Physically Abused: No    Sexually Abused: No     Current Outpatient Medications:    Ascorbic Acid (VITAMIN C) 1000 MG tablet, Take 1,000 mg by mouth daily., Disp: , Rfl:    atorvastatin (LIPITOR) 40 MG tablet, TAKE 1 TABLET BY MOUTH DAILY., Disp: 90 tablet, Rfl: 1   Cholecalciferol (VITAMIN D) 2000 units CAPS, Take 1 capsule (2,000 Units total) by mouth daily., Disp: 30 capsule, Rfl: 0   cyclobenzaprine (FLEXERIL) 5 MG tablet, TAKE 1 TABLET BY MOUTH AT BEDTIME AS NEEDED FOR MUSCLE SPASM, Disp: 90 tablet, Rfl: 1   Dulaglutide (TRULICITY) 3 NL/9.7QB SOPN, Inject 3 mg as directed once a week., Disp: 6 mL, Rfl: 1   DULoxetine (CYMBALTA) 60 MG capsule, TAKE 1 CAPSULE BY MOUTH DAILY., Disp: 90 capsule, Rfl: 1   gabapentin (NEURONTIN) 100 MG capsule, TAKE 2 CAPSULES (200 MG) BY MOUTH AT BEDTIME., Disp: 180 capsule, Rfl: 1   hydrOXYzine (ATARAX) 10 MG tablet, Take 1 tablet by mouth three times daily as needed, Disp: 270 tablet, Rfl: 0   levothyroxine (SYNTHROID)  25 MCG tablet, Take 1 tablet (25 mcg total) by mouth daily before breakfast., Disp: 90 tablet, Rfl: 1   Multiple Vitamins-Minerals (MULTI-VITAMIN GUMMIES) CHEW, Chew 2 capsules by mouth daily., Disp: 30 tablet, Rfl: 0   omeprazole (PRILOSEC) 40 MG capsule, TAKE 1 CAPSULE BY MOUTH EVERY DAY, Disp: 90 capsule, Rfl: 1   QUEtiapine (SEROQUEL) 200 MG tablet, TAKE 1  TABLET BY MOUTH AT BEDTIME., Disp: 90 tablet, Rfl: 1   sulfamethoxazole-trimethoprim (BACTRIM) 400-80 MG tablet, Take 1 tablet by mouth 2 (two) times daily., Disp: 10 tablet, Rfl: 0  Allergies  Allergen Reactions   Vicodin [Hydrocodone-Acetaminophen] Other (See Comments)    Has really bad headaches/ migraine       ROS  ***  Objective  There were no vitals filed for this visit.  There is no height or weight on file to calculate BMI.  Physical Exam ***  No results found for this or any previous visit (from the past 2160 hour(s)).   Fall Risk:    07/12/2021    1:44 PM 01/03/2021    8:37 AM 08/05/2020    2:04 PM 05/10/2020    1:36 PM 11/23/2019    2:37 PM  Fall Risk   Falls in the past year? 0 0 0 0 0  Number falls in past yr: 0 0 0 0 0  Injury with Fall? 0 0 0 0 0  Risk for fall due to : No Fall Risks      Follow up Falls prevention discussed         Functional Status Survey:     Assessment & Plan  1. Well adult exam ***   -USPSTF grade A and B recommendations reviewed with patient; age-appropriate recommendations, preventive care, screening tests, etc discussed and encouraged; healthy living encouraged; see AVS for patient education given to patient -Discussed importance of 150 minutes of physical activity weekly, eat two servings of fish weekly, eat one serving of tree nuts ( cashews, pistachios, pecans, almonds.Marland Kitchen) every other day, eat 6 servings of fruit/vegetables daily and drink plenty of water and avoid sweet beverages.   -Reviewed Health Maintenance: Yes.

## 2021-11-24 ENCOUNTER — Encounter: Payer: BC Managed Care – PPO | Admitting: Family Medicine

## 2021-11-30 ENCOUNTER — Other Ambulatory Visit: Payer: Self-pay | Admitting: Family Medicine

## 2021-11-30 ENCOUNTER — Other Ambulatory Visit: Payer: Self-pay

## 2021-11-30 DIAGNOSIS — F411 Generalized anxiety disorder: Secondary | ICD-10-CM

## 2021-11-30 DIAGNOSIS — M545 Low back pain, unspecified: Secondary | ICD-10-CM

## 2021-11-30 DIAGNOSIS — I700 Atherosclerosis of aorta: Secondary | ICD-10-CM

## 2021-11-30 DIAGNOSIS — Z9884 Bariatric surgery status: Secondary | ICD-10-CM

## 2021-11-30 DIAGNOSIS — F3162 Bipolar disorder, current episode mixed, moderate: Secondary | ICD-10-CM

## 2021-12-01 ENCOUNTER — Other Ambulatory Visit: Payer: Self-pay

## 2021-12-01 ENCOUNTER — Other Ambulatory Visit: Payer: Self-pay | Admitting: Family Medicine

## 2021-12-01 DIAGNOSIS — F411 Generalized anxiety disorder: Secondary | ICD-10-CM

## 2021-12-01 DIAGNOSIS — F3162 Bipolar disorder, current episode mixed, moderate: Secondary | ICD-10-CM

## 2021-12-01 MED ORDER — VITAMIN C 1000 MG PO TABS
1000.0000 mg | ORAL_TABLET | Freq: Every day | ORAL | 1 refills | Status: DC
Start: 2021-12-01 — End: 2022-11-20
  Filled 2021-12-01 – 2022-05-07 (×11): qty 100, 100d supply, fill #0

## 2021-12-01 MED ORDER — VITAMIN D 50 MCG (2000 UT) PO CAPS: 1.0000 | ORAL_CAPSULE | Freq: Every day | ORAL | 0 refills | Status: DC

## 2021-12-04 ENCOUNTER — Other Ambulatory Visit: Payer: Self-pay | Admitting: Family Medicine

## 2021-12-04 ENCOUNTER — Other Ambulatory Visit: Payer: Self-pay

## 2021-12-04 DIAGNOSIS — F411 Generalized anxiety disorder: Secondary | ICD-10-CM

## 2021-12-05 ENCOUNTER — Other Ambulatory Visit: Payer: Self-pay | Admitting: Family Medicine

## 2021-12-05 ENCOUNTER — Encounter: Payer: Self-pay | Admitting: Family Medicine

## 2021-12-05 ENCOUNTER — Other Ambulatory Visit: Payer: Self-pay

## 2021-12-05 DIAGNOSIS — I700 Atherosclerosis of aorta: Secondary | ICD-10-CM

## 2021-12-05 DIAGNOSIS — F411 Generalized anxiety disorder: Secondary | ICD-10-CM

## 2021-12-05 DIAGNOSIS — F3162 Bipolar disorder, current episode mixed, moderate: Secondary | ICD-10-CM

## 2021-12-05 DIAGNOSIS — M545 Low back pain, unspecified: Secondary | ICD-10-CM

## 2021-12-06 ENCOUNTER — Other Ambulatory Visit: Payer: Self-pay | Admitting: Family Medicine

## 2021-12-06 ENCOUNTER — Other Ambulatory Visit: Payer: Self-pay

## 2021-12-06 DIAGNOSIS — F3162 Bipolar disorder, current episode mixed, moderate: Secondary | ICD-10-CM

## 2021-12-06 DIAGNOSIS — I700 Atherosclerosis of aorta: Secondary | ICD-10-CM

## 2021-12-06 DIAGNOSIS — M545 Low back pain, unspecified: Secondary | ICD-10-CM

## 2021-12-06 DIAGNOSIS — F411 Generalized anxiety disorder: Secondary | ICD-10-CM

## 2021-12-06 DIAGNOSIS — Z9884 Bariatric surgery status: Secondary | ICD-10-CM

## 2021-12-06 MED ORDER — HYDROXYZINE HCL 10 MG PO TABS
ORAL_TABLET | ORAL | 0 refills | Status: DC
  Filled 2021-12-06: qty 30, 10d supply, fill #0

## 2021-12-06 NOTE — Telephone Encounter (Signed)
Requested medication (s) are due for refill today: no -all too soon (except for hydroxyzine)  Requested medication (s) are on the active medication list: yes  Last refill:  all last refilled 07/12/21   Future visit scheduled: yes  Notes to clinic:  Can pt have a refill of her hydroxyzine: last RF 07/12/21 #270 no RF    Requested Prescriptions  Pending Prescriptions Disp Refills   QUEtiapine (SEROQUEL) 200 MG tablet 90 tablet     Sig: TAKE 1 TABLET BY MOUTH AT BEDTIME.     Not Delegated - Psychiatry:  Antipsychotics - Second Generation (Atypical) - quetiapine Failed - 12/06/2021 12:31 PM      Failed - This refill cannot be delegated      Failed - Lipid Panel in normal range within the last 12 months    Cholesterol  Date Value Ref Range Status  01/03/2021 146 <200 mg/dL Final  07/08/2013 121 0 - 200 mg/dL Final   Ldl Cholesterol, Calc  Date Value Ref Range Status  07/08/2013 53 0 - 100 mg/dL Final   LDL Cholesterol (Calc)  Date Value Ref Range Status  01/03/2021 62 mg/dL (calc) Final    Comment:    Reference range: <100 . Desirable range <100 mg/dL for primary prevention;   <70 mg/dL for patients with CHD or diabetic patients  with > or = 2 CHD risk factors. Marland Kitchen LDL-C is now calculated using the Martin-Hopkins  calculation, which is a validated novel method providing  better accuracy than the Friedewald equation in the  estimation of LDL-C.  Cresenciano Genre et al. Annamaria Helling. 5573;220(25): 2061-2068  (http://education.[facility].com/faq/FAQ164)    HDL Cholesterol  Date Value Ref Range Status  07/08/2013 42 40 - 60 mg/dL Final   HDL  Date Value Ref Range Status  01/03/2021 68 > OR = 50 mg/dL Final   Triglycerides  Date Value Ref Range Status  01/03/2021 77 <150 mg/dL Final  07/08/2013 132 0 - 200 mg/dL Final         Failed - CMP within normal limits and completed in the last 12 months    Albumin  Date Value Ref Range Status  01/09/2018 3.5 3.5 - 5.0 g/dL Final   03/03/2014 3.7 3.4 - 5.0 g/dL Final   Alkaline Phosphatase  Date Value Ref Range Status  01/09/2018 206 (H) 38 - 126 U/L Final  03/03/2014 231 (H) Unit/L Final    Comment:    46-116 NOTE: New Reference Range 12/29/13    Alkaline phosphatase (APISO)  Date Value Ref Range Status  01/03/2021 220 (H) 37 - 153 U/L Final   ALT  Date Value Ref Range Status  01/03/2021 8 6 - 29 U/L Final   SGPT (ALT)  Date Value Ref Range Status  03/03/2014 28 U/L Final    Comment:    14-63 NOTE: New Reference Range 12/29/13    AST  Date Value Ref Range Status  01/03/2021 14 10 - 35 U/L Final   SGOT(AST)  Date Value Ref Range Status  03/03/2014 38 (H) 15 - 37 Unit/L Final   BUN  Date Value Ref Range Status  01/03/2021 11 7 - 25 mg/dL Final  03/03/2014 6 (L) 7 - 18 mg/dL Final   Calcium  Date Value Ref Range Status  01/03/2021 9.2 8.6 - 10.4 mg/dL Final   Calcium, Total  Date Value Ref Range Status  03/03/2014 8.9 8.5 - 10.1 mg/dL Final   CO2  Date Value Ref Range Status  01/03/2021 29 20 -  32 mmol/L Final   Co2  Date Value Ref Range Status  03/03/2014 29 21 - 32 mmol/L Final   Creat  Date Value Ref Range Status  01/03/2021 0.71 0.50 - 1.03 mg/dL Final   Glucose  Date Value Ref Range Status  03/03/2014 87 65 - 99 mg/dL Final   Glucose, Bld  Date Value Ref Range Status  01/03/2021 80 65 - 99 mg/dL Final    Comment:    .            Fasting reference interval .    Potassium  Date Value Ref Range Status  01/03/2021 4.4 3.5 - 5.3 mmol/L Final  03/03/2014 4.6 3.5 - 5.1 mmol/L Final   Sodium  Date Value Ref Range Status  01/03/2021 141 135 - 146 mmol/L Final  03/03/2014 141 136 - 145 mmol/L Final   Total Bilirubin  Date Value Ref Range Status  01/03/2021 0.7 0.2 - 1.2 mg/dL Final   Bilirubin,Total  Date Value Ref Range Status  03/03/2014 0.5 0.2 - 1.0 mg/dL Final   Bilirubin, Direct  Date Value Ref Range Status  10/24/2011 0.1 0.00 - 0.20 mg/dL Final    Protein, ur  Date Value Ref Range Status  02/11/2015 NEGATIVE NEGATIVE mg/dL Final   Protein, UA  Date Value Ref Range Status  05/08/2016 2+  Final   Total Protein  Date Value Ref Range Status  01/03/2021 6.8 6.1 - 8.1 g/dL Final  03/03/2014 7.6 6.4 - 8.2 g/dL Final   GFR, Est African American  Date Value Ref Range Status  11/23/2019 123 > OR = 60 mL/min/1.32m2 Final   eGFR  Date Value Ref Range Status  01/03/2021 102 > OR = 60 mL/min/1.41m2 Final    Comment:    The eGFR is based on the CKD-EPI 2021 equation. To calculate  the new eGFR from a previous Creatinine or Cystatin C result, go to https://www.kidney.org/professionals/ kdoqi/gfr%5Fcalculator    GFR, Est Non African American  Date Value Ref Range Status  11/23/2019 106 > OR = 60 mL/min/1.48m2 Final         Passed - TSH in normal range and within 360 days    TSH  Date Value Ref Range Status  01/03/2021 2.48 mIU/L Final    Comment:              Reference Range .           > or = 20 Years  0.40-4.50 .                Pregnancy Ranges           First trimester    0.26-2.66           Second trimester   0.55-2.73           Third trimester    0.43-2.91          Passed - Completed PHQ-2 or PHQ-9 in the last 360 days      Passed - Last BP in normal range    BP Readings from Last 1 Encounters:  07/12/21 122/78         Passed - Last Heart Rate in normal range    Pulse Readings from Last 1 Encounters:  07/12/21 93         Passed - Valid encounter within last 6 months    Recent Outpatient Visits           4 months ago GAD (generalized anxiety disorder)  [facility] Steele Sizer, [facility]   11 months ago GAD (generalized anxiety disorder)   [facility] Steele Sizer, MD   1 year ago Bipolar 1 disorder, mixed, moderate ([place])   [facility] Steele Sizer, [facility]   1 year ago Bipolar 1 disorder, mixed, moderate ([place])   [place] Steele Sizer, MD   2 years ago Well adult exam   [facility] Steele Sizer, [facility]       Future Appointments             In 2 days Steele Sizer, MD [facility], [place]   In 1 month Steele Sizer, MD [facility], [place]   In 2 months Steele Sizer, MD [facility], [place] within normal limits and completed in the last 12 months    WBC  Date Value Ref Range Status  01/03/2021 7.1 3.8 - 10.8 Thousand/uL Final   RBC  Date Value Ref Range Status  01/03/2021 4.41 3.80 - 5.10 Million/uL Final   Hemoglobin  Date Value Ref Range Status  01/03/2021 12.5 11.7 - 15.5 g/dL Final   HGB  Date Value Ref Range Status  03/03/2014 15.0 12.0 - 16.0 g/dL Final   HCT  Date Value Ref Range Status  01/03/2021 38.3 35.0 - 45.0 % Final  03/03/2014 44.6 35.0 - 47.0 % Final   MCHC  Date Value Ref Range Status  01/03/2021 32.6 32.0 - 36.0 g/dL Final   [facility]  Date Value Ref Range Status  01/03/2021 28.3 27.0 - 33.0 pg Final   MCV  Date Value Ref Range Status  01/03/2021 86.8 80.0 - 100.0 fL Final  03/03/2014 96 80 - 100 fL Final   No results found for: "PLTCOUNTKUC", "LABPLAT", "POCPLA" RDW  Date Value Ref Range Status  01/03/2021 13.2 11.0 - 15.0 % Final  03/03/2014 13.3 11.5 - 14.5 % Final          cyclobenzaprine (FLEXERIL) 5 MG tablet 90 tablet     Sig: TAKE 1 TABLET BY MOUTH AT BEDTIME AS NEEDED FOR MUSCLE SPASM     Not Delegated - Analgesics:  Muscle Relaxants Failed - 12/06/2021 12:31 PM      Failed - This refill cannot be delegated      Passed - Valid encounter within last 6 months    Recent Outpatient Visits           4 months ago GAD (generalized anxiety disorder)   [facility] Steele Sizer, MD   11 months ago GAD (generalized anxiety disorder)   [facility] Steele Sizer, [facility]   1 year ago Bipolar 1 disorder,  mixed, moderate [facility])   [facility] Steele Sizer, [facility]   1 year ago Bipolar 1 disorder, mixed, moderate [facility])   [facility] Steele Sizer, [facility]   2 years ago Well adult exam   [facility]izer, [facility]       Future Appointments             In 2 days Steele Sizer, MD [facility], [place]   In 1 month Steele Sizer, MD [facility], [place]   In 2 months Steele Sizer, MD [facility], [facility]  hydrOXYzine (ATARAX) 10 MG tablet 270 tablet     Sig: Take 1 tablet by mouth three times daily as needed     Ear, Nose, and Throat:  Antihistamines 2 Passed - 12/06/2021 12:31 PM      Passed - Cr in normal range and within 360 days    Creat  Date Value Ref Range Status  01/03/2021 0.71 0.50 - 1.03 mg/dL Final         Passed - Valid encounter within last 12 months    Recent Outpatient Visits           4 months ago GAD (generalized anxiety disorder)   [facility] Steele Sizer, MD   11 months ago GAD (generalized anxiety disorder)   [facility] Steele Sizer, MD   1 year ago Bipolar 1 disorder, mixed, moderate [facility])   [facility] Steele Sizer, MD   1 year ago Bipolar 1 disorder, mixed, moderate [facility])   [facility] Steele Sizer, MD   2 years ago Well adult exam   [facility] Steele Sizer, MD       Future Appointments             In 2 days Steele Sizer, MD [facility], [place]   In 1 month Steele Sizer, MD [facility], [place]   In 2 months Steele Sizer, MD [facility], [facility]            Refused Prescriptions Disp Refills   atorvastatin (LIPITOR) 40 MG tablet 90 tablet     Sig: TAKE 1 TABLET BY MOUTH DAILY.     Cardiovascular:  Antilipid - Statins Failed - 12/06/2021 12:31  PM      Failed - Lipid Panel in normal range within the last 12 months    Cholesterol  Date Value Ref Range Status  01/03/2021 146 <200 mg/dL Final  07/08/2013 121 0 - 200 mg/dL Final   Ldl Cholesterol, Calc  Date Value Ref Range Status  07/08/2013 53 0 - 100 mg/dL Final   LDL Cholesterol (Calc)  Date Value Ref Range Status  01/03/2021 62 mg/dL (calc) Final    Comment:    Reference range: <100 . Desirable range <100 mg/dL for primary prevention;   <70 mg/dL for patients with CHD or diabetic patients  with > or = 2 CHD risk factors. Marland Kitchen LDL-C is now calculated using the Martin-Hopkins  calculation, which is a validated novel method providing  better accuracy than the Friedewald equation in the  estimation of LDL-C.  Cresenciano Genre et al. Annamaria Helling. 8325;498(26): 2061-2068  (http://education.[facility].com/faq/FAQ164)    HDL Cholesterol  Date Value Ref Range Status  07/08/2013 42 40 - 60 mg/dL Final   HDL  Date Value Ref Range Status  01/03/2021 68 > OR = 50 mg/dL Final   Triglycerides  Date Value Ref Range Status  01/03/2021 77 <150 mg/dL Final  07/08/2013 132 0 - 200 mg/dL Final         Passed - Patient is not pregnant      Passed - Valid encounter within last 12 months    Recent Outpatient Visits           4 months ago GAD (generalized anxiety disorder)   [facility] Steele Sizer, [facility]   11 months ago GAD (generalized anxiety disorder)   [facility] Steele Sizer, MD   1 year ago Bipolar 1  disorder, mixed, moderate ([place])   [facility] Steele Sizer, MD   1 year ago Bipolar 1 disorder, mixed, moderate [facility])   [facility] Steele Sizer, MD   2 years ago Well adult exam   [facility] Steele Sizer, [facility]       Future Appointments             In 2 days Steele Sizer, MD [facility], [place]   In 1 month Steele Sizer, MD [facility], [place]   In 2 months Steele Sizer, MD [facility], [facility]             DULoxetine (CYMBALTA) 60 MG capsule 90 capsule     Sig: TAKE 1 CAPSULE BY MOUTH DAILY.     Psychiatry: Antidepressants - SNRI - duloxetine Passed - 12/06/2021 12:31 PM      Passed - Cr in normal range and within 360 days    Creat  Date Value Ref Range Status  01/03/2021 0.71 0.50 - 1.03 mg/dL Final         Passed - eGFR is 30 or above and within 360 days    GFR, Est African American  Date Value Ref Range Status  11/23/2019 123 > OR = 60 mL/min/1.31m2 Final   GFR, Est Non African American  Date Value Ref Range Status  11/23/2019 106 > OR = 60 mL/min/1.67m2 Final   eGFR  Date Value Ref Range Status  01/03/2021 102 > OR = 60 mL/min/1.49m2 Final    Comment:    The eGFR is based on the CKD-EPI 2021 equation. To calculate  the new eGFR from a previous Creatinine or Cystatin C result, go to https://www.kidney.org/professionals/ kdoqi/gfr%5Fcalculator          Passed - Completed PHQ-2 or PHQ-9 in the last 360 days      Passed - Last BP in normal range    BP Readings from Last 1 Encounters:  07/12/21 122/78         Passed - Valid encounter within last 6 months    Recent Outpatient Visits           4 months ago GAD (generalized anxiety disorder)   [facility] Steele [facility]izer, MD   11 months ago GAD (generalized anxiety disorder)   [facility]izer, MD   1 year ago Bipolar 1 disorder, mixed, moderate [facility])   [facility] Steele Sizer, MD   1 year ago Bipolar 1 disorder, mixed, moderate [facility])   [facility] Steele Sizer, MD   2 years ago Well adult exam   [facility] Steele Sizer, [facility]       Future Appointments             In 2 days Steele Sizer, MD [facility], [place]   In 1 month Steele Sizer, MD [facility], [place]   In 2 months Steele Sizer, MD [facility], [facility]             gabapentin (NEURONTIN) 100 MG capsule 180 capsule     Sig: TAKE 2 CAPSULES (200 MG) BY MOUTH AT BEDTIME.     Neurology: Anticonvulsants - gabapentin Passed - 12/06/2021 12:31 PM      Passed - Cr in normal range and within 360 days    Creat  Date Value Ref Range Status  01/03/2021 0.71  0.50 - 1.03 mg/dL Final         Passed - Completed PHQ-2 or PHQ-9 in the last 360 days      Passed - Valid encounter within last 12 months    Recent Outpatient Visits           4 months ago GAD (generalized anxiety disorder)   [facility] Steele Sizer, MD   11 months ago GAD (generalized anxiety disorder)   [facility] Steele Sizer, MD   1 year ago Bipolar 1 disorder, mixed, moderate [facility])   [facility], [facility]   1 year ago Bipolar 1 disorder, mixed, moderate [facility])   [facility] Steele Sizer, MD   2 years ago Well adult exam   [facility] Steele Sizer, MD       Future Appointments             In 2 days Steele Sizer, MD [facility], [place]   In 1 month Steele Sizer, MD [facility], [place]   In 2 months Steele Sizer, MD [facility], [facility]

## 2021-12-06 NOTE — Telephone Encounter (Signed)
Spoke to Jessica [name] at [facility]. Stated she is still waiting on approval for cyclobenzaprine,gabapentin, atorvastatin, duloxetine,hydroxyzine and Seroquel

## 2021-12-06 NOTE — Telephone Encounter (Signed)
Requested Prescriptions  Pending Prescriptions Disp Refills  . QUEtiapine (SEROQUEL) 200 MG tablet 90 tablet     Sig: TAKE 1 TABLET BY MOUTH AT BEDTIME.     Not Delegated - Psychiatry:  Antipsychotics - Second Generation (Atypical) - quetiapine Failed - 12/06/2021 12:31 PM      Failed - This refill cannot be delegated      Failed - Lipid Panel in normal range within the last 12 months    Cholesterol  Date Value Ref Range Status  01/03/2021 146 <200 mg/dL Final  07/08/2013 121 0 - 200 mg/dL Final   Ldl Cholesterol, Calc  Date Value Ref Range Status  07/08/2013 53 0 - 100 mg/dL Final   LDL Cholesterol (Calc)  Date Value Ref Range Status  01/03/2021 62 mg/dL (calc) Final    Comment:    Reference range: <100 . Desirable range <100 mg/dL for primary prevention;   <70 mg/dL for patients with CHD or diabetic patients  with > or = 2 CHD risk factors. Marland Kitchen LDL-C is now calculated using the Martin-Hopkins  calculation, which is a validated novel method providing  better accuracy than the Friedewald equation in the  estimation of LDL-C.  Cresenciano Genre et al. Annamaria Helling. 1610;960(45): 2061-2068  (http://education.[facility].com/faq/FAQ164)    HDL Cholesterol  Date Value Ref Range Status  07/08/2013 42 40 - 60 mg/dL Final   HDL  Date Value Ref Range Status  01/03/2021 68 > OR = 50 mg/dL Final   Triglycerides  Date Value Ref Range Status  01/03/2021 77 <150 mg/dL Final  07/08/2013 132 0 - 200 mg/dL Final         Failed - CMP within normal limits and completed in the last 12 months    Albumin  Date Value Ref Range Status  01/09/2018 3.5 3.5 - 5.0 g/dL Final  03/03/2014 3.7 3.4 - 5.0 g/dL Final   Alkaline Phosphatase  Date Value Ref Range Status  01/09/2018 206 (H) 38 - 126 U/L Final  03/03/2014 231 (H) Unit/L Final    Comment:    46-116 NOTE: New Reference Range 12/29/13    Alkaline phosphatase (APISO)  Date Value Ref Range Status  01/03/2021 220 (H) 37 - 153 U/L Final    ALT  Date Value Ref Range Status  01/03/2021 8 6 - 29 U/L Final   SGPT (ALT)  Date Value Ref Range Status  03/03/2014 28 U/L Final    Comment:    14-63 NOTE: New Reference Range 12/29/13    AST  Date Value Ref Range Status  01/03/2021 14 10 - 35 U/L Final   SGOT(AST)  Date Value Ref Range Status  03/03/2014 38 (H) 15 - 37 Unit/L Final   BUN  Date Value Ref Range Status  01/03/2021 11 7 - 25 mg/dL Final  03/03/2014 6 (L) 7 - 18 mg/dL Final   Calcium  Date Value Ref Range Status  01/03/2021 9.2 8.6 - 10.4 mg/dL Final   Calcium, Total  Date Value Ref Range Status  03/03/2014 8.9 8.5 - 10.1 mg/dL Final   CO2  Date Value Ref Range Status  01/03/2021 29 20 - 32 mmol/L Final   Co2  Date Value Ref Range Status  03/03/2014 29 21 - 32 mmol/L Final   Creat  Date Value Ref Range Status  01/03/2021 0.71 0.50 - 1.03 mg/dL Final   Glucose  Date Value Ref Range Status  03/03/2014 87 65 - 99 mg/dL Final   Glucose, Bld  Date Value  Ref Range Status  01/03/2021 80 65 - 99 mg/dL Final    Comment:    .            Fasting reference interval .    Potassium  Date Value Ref Range Status  01/03/2021 4.4 3.5 - 5.3 mmol/L Final  03/03/2014 4.6 3.5 - 5.1 mmol/L Final   Sodium  Date Value Ref Range Status  01/03/2021 141 135 - 146 mmol/L Final  03/03/2014 141 136 - 145 mmol/L Final   Total Bilirubin  Date Value Ref Range Status  01/03/2021 0.7 0.2 - 1.2 mg/dL Final   Bilirubin,Total  Date Value Ref Range Status  03/03/2014 0.5 0.2 - 1.0 mg/dL Final   Bilirubin, Direct  Date Value Ref Range Status  10/24/2011 0.1 0.00 - 0.20 mg/dL Final   Protein, ur  Date Value Ref Range Status  02/11/2015 NEGATIVE NEGATIVE mg/dL Final   Protein, UA  Date Value Ref Range Status  05/08/2016 2+  Final   Total Protein  Date Value Ref Range Status  01/03/2021 6.8 6.1 - 8.1 g/dL Final  03/03/2014 7.6 6.4 - 8.2 g/dL Final   GFR, Est African American  Date Value Ref Range  Status  11/23/2019 123 > OR = 60 mL/min/1.31m Final   eGFR  Date Value Ref Range Status  01/03/2021 102 > OR = 60 mL/min/1.733mFinal    Comment:    The eGFR is based on the CKD-EPI 2021 equation. To calculate  the new eGFR from a previous Creatinine or Cystatin C result, go to https://www.kidney.org/professionals/ kdoqi/gfr%5Fcalculator    GFR, Est Non African American  Date Value Ref Range Status  11/23/2019 106 > OR = 60 mL/min/1.7338minal         Passed - TSH in normal range and within 360 days    TSH  Date Value Ref Range Status  01/03/2021 2.48 mIU/L Final    Comment:              Reference Range .           > or = 20 Years  0.40-4.50 .                Pregnancy Ranges           First trimester    0.26-2.66           Second trimester   0.55-2.73           Third trimester    0.43-2.91          Passed - Completed PHQ-2 or PHQ-9 in the last 360 days      Passed - Last BP in normal range    BP Readings from Last 1 Encounters:  07/12/21 122/78         Passed - Last Heart Rate in normal range    Pulse Readings from Last 1 Encounters:  07/12/21 93         Passed - Valid encounter within last 6 months    Recent Outpatient Visits          4 months ago GAD (generalized anxiety disorder)   [facility]   11 months ago GAD [facility]generalized anxiety disorder)   [facility]   1 year ago [facility], mixed, [facility]   1 year ago [facility] 1 disorder, mixed, [facility]   2 years ago Well adult exam   [facility] Steele Sizer, MD      Future Appointments            In 2 days Steele Sizer, MD [facility], [place]   In 1 month Steele Sizer, MD [facility], [place]   In 2 months Steele Sizer, MD [facility], [place] within normal limits and completed in the last 12 months    WBC  Date Value Ref Range Status  01/03/2021 7.1 3.8 - 10.8 Thousand/uL Final   RBC  Date Value Ref Range Status  01/03/2021 4.41 3.80 - 5.10 Million/uL Final   Hemoglobin  Date Value Ref Range Status  01/03/2021 12.5 11.7 - 15.5 g/dL Final   HGB  Date Value Ref Range Status  03/03/2014 15.0 12.0 - 16.0 g/dL Final   HCT  Date Value Ref Range Status  01/03/2021 38.3 35.0 - 45.0 % Final  03/03/2014 44.6 35.0 - 47.0 % Final   MCHC  Date Value Ref Range Status  01/03/2021 32.6 32.0 - 36.0 g/dL Final   [facility]  Date Value Ref Range Status  01/03/2021 28.3 27.0 - 33.0 pg Final   MCV  Date Value Ref Range Status  01/03/2021 86.8 80.0 - 100.0 fL Final  03/03/2014 96 80 - 100 fL Final   No results found for: "PLTCOUNTKUC", "LABPLAT", "POCPLA" RDW  Date Value Ref Range Status  01/03/2021 13.2 11.0 - 15.0 % Final  03/03/2014 13.3 11.5 - 14.5 % Final         . atorvastatin (LIPITOR) 40 MG tablet 90 tablet     Sig: TAKE 1 TABLET BY MOUTH DAILY.     Cardiovascular:  Antilipid - Statins Failed - 12/06/2021 12:31 PM      Failed - Lipid Panel in normal range within the last 12 months    Cholesterol  Date Value Ref Range Status  01/03/2021 146 <200 mg/dL Final  07/08/2013 121 0 - 200 mg/dL Final   Ldl Cholesterol, Calc  Date Value Ref Range Status  07/08/2013 53 0 - 100 mg/dL Final   LDL Cholesterol (Calc)  Date Value Ref Range Status  01/03/2021 62 mg/dL (calc) Final    Comment:    Reference range: <100 . Desirable range <100 mg/dL for primary prevention;   <70 mg/dL for patients with CHD or diabetic patients  with > or = 2 CHD risk factors. Marland Kitchen LDL-C is now calculated using the Martin-Hopkins  calculation, which is a validated novel method providing  better accuracy than the Friedewald equation in the  estimation of LDL-C.  Cresenciano Genre et al. Annamaria Helling.  0814;481(85): 2061-2068  (http://education.[facility].com/faq/FAQ164)    HDL Cholesterol  Date Value Ref Range Status  07/08/2013 42 40 - 60 mg/dL Final   HDL  Date Value Ref Range Status  01/03/2021 68 > OR = 50 mg/dL Final   Triglycerides  Date Value Ref Range Status  01/03/2021 77 <150 mg/dL Final  07/08/2013 132 0 - 200 mg/dL Final         Passed - Patient is not pregnant      Passed - Valid encounter within last 12 months    Recent Outpatient Visits          4 months ago GAD (generalized anxiety disorder)   [facility] Sizer, [facility]  11 months ago GAD (generalized anxiety disorder)   [facility] Steele Sizer, [facility]   1 year ago Bipolar 1 disorder, mixed, moderate ([place])   [facility] Steele Sizer, MD   1 year ago Bipolar 1 disorder, mixed, moderate [facility])   [facility] Steele Sizer, MD   2 years ago Well adult exam   [facility] Steele Sizer, MD      Future Appointments            In 2 days Steele Sizer, MD [facility], [place]   In 1 month Steele Sizer, MD [facility], [employer]   In 2 months Steele Sizer, MD [facility], [facility]           . cyclobenzaprine (FLEXERIL) 5 MG tablet 90 tablet     Sig: TAKE 1 TABLET BY MOUTH AT BEDTIME AS NEEDED FOR MUSCLE SPASM     Not Delegated - Analgesics:  Muscle Relaxants Failed - 12/06/2021 12:31 PM      Failed - This refill cannot be delegated      Passed - Valid encounter within last 6 months    Recent Outpatient Visits          4 months ago GAD (generalized anxiety disorder)   [facility] Steele Sizer, MD   11 months ago GAD (generalized anxiety disorder)   [facility] Sizer, MD   1 year ago Bipolar 1 disorder, mixed, moderate [facility])   [facility]izer, MD    1 year ago Bipolar 1 disorder, mixed, moderate [facility])   [facility] Steele Sizer, MD   2 years ago Well adult exam   [facility] Steele Sizer, MD      Future Appointments            In 2 days [facility] [place]   In 1 month Steele Sizer, MD [facility], [place]   In 2 months Steele Sizer, MD [facility], [place]           . DULoxetine (CYMBALTA) 60 MG capsule 90 capsule     Sig: TAKE 1 CAPSULE BY MOUTH DAILY.     Psychiatry: Antidepressants - SNRI - duloxetine Passed - 12/06/2021 12:31 PM      Passed - Cr in normal range and within 360 days    Creat  Date Value Ref Range Status  01/03/2021 0.71 0.50 - 1.03 mg/dL Final         Passed - eGFR is 30 or above and within 360 days    GFR, Est African American  Date Value Ref Range Status  11/23/2019 123 > OR = 60 mL/min/1.30m Final   GFR, Est Non African American  Date Value Ref Range Status  11/23/2019 106 > OR = 60 mL/min/1.777mFinal   eGFR  Date Value Ref Range Status  01/03/2021 102 > OR = 60 mL/min/1.7369minal    Comment:    The eGFR is based on the CKD-EPI 2021 equation. To calculate  the new eGFR from a previous Creatinine or Cystatin C result, go to https://www.kidney.org/professionals/ kdoqi/gfr%5Fcalculator          Passed - Completed PHQ-2 or PHQ-9 in the last 360 days      Passed - Last BP in normal range    BP Readings from Last 1 Encounters:  07/12/21 122/78         Passed - Valid encounter within last 6 months    Recent Outpatient Visits          4 months ago GAD (generalized anxiety disorder)   [facility] Steele Sizer, MD   11 months ago GAD (generalized anxiety disorder)   [facility] Steele Sizer, MD   1 year ago Bipolar 1 disorder, mixed, moderate [facility])   [facility] Steele Sizer, MD   1 year ago  Bipolar 1 disorder, mixed, moderate [facility])   [facility] Steele Sizer, MD   2 years ago Well adult exam   [facility] Steele Sizer, MD      Future Appointments            In 2 days Steele Sizer, MD [facility], [place]   In 1 month Steele Sizer, MD [facility], [place]   In 2 months Steele Sizer, MD [facility], [facility]           . gabapentin (NEURONTIN) 100 MG capsule 180 capsule     Sig: TAKE 2 CAPSULES (200 MG) BY MOUTH AT BEDTIME.     Neurology: Anticonvulsants - gabapentin Passed - 12/06/2021 12:31 PM      Passed - Cr in normal range and within 360 days    Creat  Date Value Ref Range Status  01/03/2021 0.71 0.50 - 1.03 mg/dL Final         Passed - Completed PHQ-2 or PHQ-9 in the last 360 days      Passed - Valid encounter within last 12 months    Recent Outpatient Visits          4 months ago GAD (generalized anxiety disorder)   [facility] Steele Sizer, MD   11 months ago GAD (generalized anxiety disorder)   [facility] Steele Sizer, MD   1 year ago Bipolar 1 disorder, mixed, moderate ([place])   [facility] Steele Sizer, MD   1 year ago Bipolar 1 disorder, mixed, moderate [facility])   [facility] Steele Sizer, MD   2 years ago Well adult exam   [facility] Steele Sizer, MD      Future Appointments            In 2 days Steele Sizer, MD [facility], [place]   In 1 month Steele Sizer, MD [facility], [place]   In 2 months Steele Sizer, MD [facility], [facility]           . hydrOXYzine (ATARAX) 10 MG tablet 270 tablet     Sig: Take 1 tablet by mouth three times daily as needed     Ear, Nose, and Throat:  Antihistamines 2 Passed - 12/06/2021 12:31 PM      Passed - Cr in normal range and within 360  days    Creat  Date Value Ref Range Status  01/03/2021 0.71 0.50 - 1.03 mg/dL Final         Passed - Valid encounter within last 12 months    Recent Outpatient Visits          4 months ago GAD (generalized anxiety disorder)   [facility] Steele Sizer, MD   11 months ago GAD (generalized anxiety disorder)   [facility] Steele Sizer, MD  1 year ago Bipolar 1 disorder, mixed, moderate ([place])   [facility] Steele Sizer, MD   1 year ago Bipolar 1 disorder, mixed, moderate [facility])   [facility] Steele Sizer, MD   2 years ago Well adult exam   [facility] Steele Sizer, MD      Future Appointments            In 2 days Steele Sizer, MD [facility], [place]   In 1 month Steele Sizer, MD [facility], [place]   In 2 months Steele Sizer, MD [facility], [facility]

## 2021-12-07 ENCOUNTER — Other Ambulatory Visit: Payer: Self-pay

## 2021-12-08 ENCOUNTER — Encounter: Payer: Self-pay | Admitting: Family Medicine

## 2021-12-08 ENCOUNTER — Ambulatory Visit: Payer: BC Managed Care – PPO | Admitting: Family Medicine

## 2021-12-08 ENCOUNTER — Other Ambulatory Visit: Payer: Self-pay

## 2021-12-08 VITALS — BP 122/80 | HR 91 | Resp 16 | Ht 65.0 in | Wt 173.0 lb

## 2021-12-08 DIAGNOSIS — I700 Atherosclerosis of aorta: Secondary | ICD-10-CM

## 2021-12-08 DIAGNOSIS — E538 Deficiency of other specified B group vitamins: Secondary | ICD-10-CM

## 2021-12-08 DIAGNOSIS — F3162 Bipolar disorder, current episode mixed, moderate: Secondary | ICD-10-CM

## 2021-12-08 DIAGNOSIS — F411 Generalized anxiety disorder: Secondary | ICD-10-CM

## 2021-12-08 DIAGNOSIS — Z9884 Bariatric surgery status: Secondary | ICD-10-CM

## 2021-12-08 DIAGNOSIS — F1990 Other psychoactive substance use, unspecified, uncomplicated: Secondary | ICD-10-CM

## 2021-12-08 DIAGNOSIS — E611 Iron deficiency: Secondary | ICD-10-CM

## 2021-12-08 DIAGNOSIS — Z79899 Other long term (current) drug therapy: Secondary | ICD-10-CM

## 2021-12-08 DIAGNOSIS — C8190 Hodgkin lymphoma, unspecified, unspecified site: Secondary | ICD-10-CM

## 2021-12-08 DIAGNOSIS — E559 Vitamin D deficiency, unspecified: Secondary | ICD-10-CM

## 2021-12-08 DIAGNOSIS — E039 Hypothyroidism, unspecified: Secondary | ICD-10-CM

## 2021-12-08 MED ORDER — QUETIAPINE FUMARATE 200 MG PO TABS
200.0000 mg | ORAL_TABLET | Freq: Every day | ORAL | 0 refills | Status: DC
  Filled 2021-12-08: qty 30, 30d supply, fill #0

## 2021-12-08 NOTE — Progress Notes (Signed)
Name: Jessica [name]   MRN: [id number]    DOB: 05/31/1966   Date:12/08/2021       Progress Note  Subjective  Chief Complaint  Medication Management   HPI  History of obesity: she had bariatric surgery 03/2011, maximum weight prior to surgery was 270 lbs, she went down to 142 lbs and was stable for many years, however when she was  diagnosed with bipolar in 07-18-15 and was placed on antipsychotic medications she started to gain weight, March 2018 weight was 183 lbs. She was on Levothyroxine and Metformin and went down to 165 lbs in May 2018 and we started her on Trulicity to help curb appetite and help with pre-diabetes/insulin resistance and she was  below her goal weight ( it was 140 lbs), and eventually down  to 129 lbs, but she was having  nausea and epigastric pain, she finally saw GI came off Nsaid's and was given carafate, omeprazole for treatment for jejunal ulcer. She felt better with medication and gained some weight. Her weight at home on 02/10/2020 was 168 lbs she joined weight watchers with her sister, cut down on sweets and carbs and was doing great, however in October 2021 she developed worsening of weight loss and epigastric pain, she was worried the peptic ulcer was back, she went back on carafate and pain resolved., weight back in July was  154 lbs, states no longer vomiting or having epigastric , today  weight is up to 173 lbs.    Pre-diabetes:hgbA1C was back in 07/18/11 was 6.3%, but down to 5.3 % with weight loss , she was gradually gaining weight after bariatric surgery Jul 17, 2010 )  and we started her on Metformin 08/2016 , followed by Trulicity but we stopped medication when she lost a lot of weight in 18-Jul-2019, but she has been back since early July 17, 2020 , weight is trending up again, she is still grieving the loss of her mother , is depressed and has been eating junk    B12 deficiency: not currently getting B12 injections , she is back on SL B12, occasionally  we will recheck labs today    Adult  hypothyroidism: : associated with fatigue, and mild weight gain, started on Levothyroxine Fall 2020 ., we will recheck TSH    Hodgkin's lymphoma:she went back to see Dr. Tasia Catchings in 07/17/2017 , last CBC was normal, iron storage was low ,we will recheck labs today   Bipolar: no longer seeing Dr.Eappen because of cost , last visit was 01/2019,she has insurance again, but prefers not going back yet, she has been more anxious lately, mother died July 17, 2021 and she moved to her mother's house. She is not sleeping well, she smells her scent, she cries, she has lack of energy and motivation, she is willing to go back to see Dr. Shea Evans again. She is moody towards her husband, no motivation, does not want to shower   Atherosclerosis of aorta: she is on Atorvastatin and denies side effects, last LDL at goal , but is due for repeat labs.     Patient Active Problem List   Diagnosis Date Noted   Iron deficiency anemia 01/09/2018   Cervical high risk HPV (human papillomavirus) test positive 04/08/2017   Abdominal pain, epigastric    Bariatric surgery status    Ulcer jejunum    Adenoma of left adrenal gland 02/18/2017   Abnormal TSH 08/30/2016   Bipolar disorder ([place]) 12/02/2015   HAV (hallux abducto valgus) 11/22/2015   Bunion of  great toe 11/08/2015   Attention-deficit hyperactivity disorder, other type 10/28/2015   Sciatica 04/22/2015   B12 deficiency 12/01/2014   History of bariatric surgery 12/01/2014   Anxiety, generalized 12/01/2014   Gastro-esophageal reflux disease without esophagitis 12/01/2014   H/O elevated lipids 12/01/2014   H/O: HTN (hypertension) 12/01/2014   H/O: obesity 12/01/2014   Hodgkin's disease in remission ([place]) 12/01/2014   Current tobacco use 12/01/2014   Insomnia 03/24/2008   Abnormal serum level of alkaline phosphatase 11/08/2006   Chronic recurrent major depressive disorder ([place]) 11/08/2006    Past Surgical History:  Procedure Laterality Date   ABDOMINAL HYSTERECTOMY      BUNIONECTOMY Right    CARPAL TUNNEL RELEASE     COLONOSCOPY     multiple   COLONOSCOPY WITH PROPOFOL N/A 11/20/2019   Procedure: COLONOSCOPY WITH PROPOFOL;  Surgeon: Lin Landsman, MD;  Location: [facility];  Service: Gastroenterology;  Laterality: N/A;   ESOPHAGOGASTRODUODENOSCOPY (EGD) WITH PROPOFOL N/A 02/21/2017   Procedure: ESOPHAGOGASTRODUODENOSCOPY (EGD) WITH PROPOFOL;  Surgeon: Lucilla Lame, MD;  Location: [facility];  Service: Gastroenterology;  Laterality: N/A;  Pre-diabetic   excision of neck mass  09/1998   GASTRIC BYPASS  03/12/2011   LAPAROSCOPIC SUPRACERVICAL HYSTERECTOMY  2008   and unilateral salpingoophorectomy/ endometriosis-Dr Kincius   TONSILLECTOMY      Family History  Problem Relation Age of Onset   Cancer Mother 66       colon   Epilepsy Mother    Multiple myeloma Mother    Glaucoma Father    Breast cancer Maternal Grandmother 83       inflamitory breast ca    Social History   Tobacco Use   Smoking status: Every Day    Packs/day: 0.50    Years: 16.00    Total pack years: 8.00    Types: Cigarettes, E-cigarettes    Start date: 06/11/2000    Last attempt to quit: 06/03/2020    Years since quitting: 1.5   Smokeless tobacco: Never   Tobacco comments:    Currently quitting, transitioned from cigarettes to e-cigarettes, started at 7 and is down to 3 mg   Substance Use Topics   Alcohol use: No    Alcohol/week: 0.0 standard drinks of alcohol    Comment: rarely     Current Outpatient Medications:    Ascorbic Acid (VITAMIN C) 1000 MG tablet, Take 1 tablet (1,000 mg total) by mouth daily., Disp: 100 tablet, Rfl: 1   atorvastatin (LIPITOR) 40 MG tablet, TAKE 1 TABLET BY MOUTH DAILY., Disp: 90 tablet, Rfl: 1   Cholecalciferol (VITAMIN D) 50 MCG (2000 UT) CAPS, Take 1 capsule (2,000 Units total) by mouth daily., Disp: 30 capsule, Rfl: 0   cyclobenzaprine (FLEXERIL) 5 MG tablet, TAKE 1 TABLET BY MOUTH AT BEDTIME AS NEEDED FOR MUSCLE SPASM,  Disp: 90 tablet, Rfl: 1   Dulaglutide (TRULICITY) 3 QZ/3.0QT SOPN, Inject 3 mg as directed once a week., Disp: 6 mL, Rfl: 1   DULoxetine (CYMBALTA) 60 MG capsule, TAKE 1 CAPSULE BY MOUTH DAILY., Disp: 90 capsule, Rfl: 1   gabapentin (NEURONTIN) 100 MG capsule, TAKE 2 CAPSULES (200 MG) BY MOUTH AT BEDTIME., Disp: 180 capsule, Rfl: 1   hydrOXYzine (ATARAX) 10 MG tablet, Take 1 tablet by mouth three times daily as needed, Disp: 30 tablet, Rfl: 0   levothyroxine (SYNTHROID) 25 MCG tablet, Take 1 tablet (25 mcg total) by mouth daily before breakfast., Disp: 90 tablet, Rfl: 1   Multiple Vitamins-Minerals (MULTI-VITAMIN GUMMIES) CHEW,  Chew 2 capsules by mouth daily., Disp: 30 tablet, Rfl: 0   omeprazole (PRILOSEC) 40 MG capsule, TAKE 1 CAPSULE BY MOUTH EVERY DAY, Disp: 90 capsule, Rfl: 1   QUEtiapine (SEROQUEL) 200 MG tablet, TAKE 1 TABLET BY MOUTH AT BEDTIME., Disp: 90 tablet, Rfl: 1  Allergies  Allergen Reactions   Vicodin [Hydrocodone-Acetaminophen] Other (See Comments)    Has really bad headaches/ migraine      I personally reviewed active problem list, medication list, allergies, family history, social history, health maintenance with the patient/caregiver today.   ROS  Constitutional: Negative for fever , positive for weight change.  Respiratory: Negative for cough and shortness of breath.   Cardiovascular: Negative for chest pain or palpitations.  Gastrointestinal: Negative for abdominal pain, no bowel changes.  Musculoskeletal: Negative for gait problem or joint swelling.  Skin: Negative for rash.  Neurological: Negative for dizziness or headache.  No other specific complaints in a complete review of systems (except as listed in HPI above).   Objective  Vitals:   12/08/21 1446  BP: 122/80  Pulse: 91  Resp: 16  SpO2: 97%  Weight: 173 lb (78.5 kg)  Height: 5' 5" (1.651 m)    Body mass index is 28.79 kg/m.  Physical Exam  Constitutional: Patient appears well-developed and  well-nourished. Overweight.  No distress.  HEENT: head atraumatic, normocephalic, pupils equal and reactive to light, neck supple Cardiovascular: Normal rate, regular rhythm and normal heart sounds.  No murmur heard. No BLE edema. Pulmonary/Chest: Effort normal and breath sounds normal. No respiratory distress. Abdominal: Soft.  There is no tenderness. Psychiatric: Patient has a normal mood and affect. behavior is normal. Judgment and thought content normal.    PHQ2/9:    12/08/2021    2:46 PM 07/12/2021    1:44 PM 01/03/2021    8:37 AM 08/05/2020    2:04 PM 05/10/2020    1:36 PM  Depression screen PHQ 2/9  Decreased Interest 3 1 0 1 1  Down, Depressed, Hopeless 2 1 0 1 1  PHQ - 2 Score 5 2 0 2 2  Altered sleeping _0 0 3  Tired, decreased energy _1 Change in appetite 0 0 0 0 3  Feeling bad or failure about yourself  0 0 0 0 0  Trouble concentrating 0 0 0 0 0  Moving slowly or fidgety/restless 0 0 0 0 0  Suicidal thoughts 0 0 0 0 0  PHQ-9 Score _2 Difficult doing work/chores   Not difficult at all Not difficult at all     phq 9 is positive   Fall Risk:    12/08/2021    2:46 PM 07/12/2021    1:44 PM 01/03/2021    8:37 AM 08/05/2020    2:04 PM 05/10/2020    1:36 PM  Fall Risk   Falls in the past year? 0 0 0 0 0  Number falls in past yr: 0 0 0 0 0  Injury with Fall? 0 0 0 0 0  Risk for fall due to : No Fall Risks No Fall Risks     Follow up Falls prevention discussed Falls prevention discussed         Functional Status Survey: Is the patient deaf or have difficulty hearing?: No Does the patient have difficulty seeing, even when wearing glasses/contacts?: No Does the patient have difficulty concentrating, remembering, or making decisions?: No Does the patient have difficulty walking or  climbing stairs?: No Does the patient have difficulty dressing or bathing?: No Does the patient have difficulty doing errands alone such as visiting a doctor's office or  shopping?: No    Assessment & Plan  1. B12 deficiency  - B12 and Folate Panel  2. Bipolar 1 disorder, mixed, moderate (HCC)  - QUEtiapine (SEROQUEL) 200 MG tablet; Take 1 tablet (200 mg total) by mouth at bedtime.  Dispense: 30 tablet; Refill: 0  Referral placed for her to go back to psychiatrist   3. GAD (generalized anxiety disorder)  - QUEtiapine (SEROQUEL) 200 MG tablet; Take 1 tablet (200 mg total) by mouth at bedtime.  Dispense: 30 tablet; Refill: 0  4. Atherosclerosis of abdominal aorta (HCC)  - Lipid panel  5. Hodgkin's disease in remission [facility])  Needs to follow up with oncologist   6. Vitamin D deficiency  - VITAMIN D 25 Hydroxy (Vit-D Deficiency, Fractures)  7. Iron deficiency  - CBC with Differential/Platelet - Iron, TIBC and Ferritin Panel  8. History of bariatric surgery   9. Adult hypothyroidism  - TSH  10. Long-term use of high-risk medication  - COMPLETE METABOLIC PANEL WITH GFR  11. Misuse of medication  She is very depressed, may have misplaced seroquel, she is out for the past 2 days, but had 90 day filled at [facility] - we just contacted pharmacy, we will give her a 30 day supply and refer her back to Dr. Shea Evans

## 2021-12-12 LAB — CBC WITH DIFFERENTIAL/PLATELET
Absolute Monocytes: 419 cells/uL (ref 200–950)
Basophils Absolute: 50 cells/uL (ref 0–200)
Basophils Relative: 0.7 %
Eosinophils Absolute: 263 cells/uL (ref 15–500)
Eosinophils Relative: 3.7 %
HCT: 36.9 % (ref 35.0–45.0)
Hemoglobin: 11.8 g/dL (ref 11.7–15.5)
Lymphs Abs: 3110 cells/uL (ref 850–3900)
MCH: 27.4 pg (ref 27.0–33.0)
MCHC: 32 g/dL (ref 32.0–36.0)
MCV: 85.8 fL (ref 80.0–100.0)
MPV: 9.5 fL (ref 7.5–12.5)
Monocytes Relative: 5.9 %
Neutro Abs: 3259 cells/uL (ref 1500–7800)
Neutrophils Relative %: 45.9 %
Platelets: 421 10*3/uL — ABNORMAL HIGH (ref 140–400)
RBC: 4.3 10*6/uL (ref 3.80–5.10)
RDW: 13.3 % (ref 11.0–15.0)
Total Lymphocyte: 43.8 %
WBC: 7.1 10*3/uL (ref 3.8–10.8)

## 2021-12-12 LAB — LIPID PANEL
Cholesterol: 155 mg/dL (ref <200)
HDL: 80 mg/dL (ref >=50)
LDL Cholesterol (Calc): 61 mg/dL (calc)
Non-HDL Cholesterol (Calc): 75 mg/dL (calc) (ref <130)
Total CHOL/HDL Ratio: 1.9 (calc) (ref <5.0)
Triglycerides: 63 mg/dL (ref <150)

## 2021-12-12 LAB — COMPLETE METABOLIC PANEL WITH GFR
AG Ratio: 1.2 (calc) (ref 1.0–2.5)
ALT: 10 U/L (ref 6–29)
AST: 14 U/L (ref 10–35)
Albumin: 3.9 g/dL (ref 3.6–5.1)
Alkaline phosphatase (APISO): 221 U/L — ABNORMAL HIGH (ref 37–153)
BUN: 15 mg/dL (ref 7–25)
CO2: 27 mmol/L (ref 20–32)
Calcium: 9.3 mg/dL (ref 8.6–10.4)
Chloride: 105 mmol/L (ref 98–110)
Creat: 0.79 mg/dL (ref 0.50–1.03)
Globulin: 3.2 g/dL (calc) (ref 1.9–3.7)
Glucose, Bld: 88 mg/dL (ref 65–99)
Potassium: 4.6 mmol/L (ref 3.5–5.3)
Sodium: 141 mmol/L (ref 135–146)
Total Bilirubin: 0.5 mg/dL (ref 0.2–1.2)
Total Protein: 7.1 g/dL (ref 6.1–8.1)
eGFR: 89 mL/min/{1.73_m2} (ref >=60)

## 2021-12-12 LAB — B12 AND FOLATE PANEL
Folate: 7.1 ng/mL
Vitamin B-12: 247 pg/mL (ref 200–1100)

## 2021-12-12 LAB — IRON,TIBC AND FERRITIN PANEL
%SAT: 11 % (calc) — ABNORMAL LOW (ref 16–45)
Ferritin: 4 ng/mL — ABNORMAL LOW (ref 16–232)
Iron: 43 ug/dL — ABNORMAL LOW (ref 45–160)
TIBC: 402 mcg/dL (calc) (ref 250–450)

## 2021-12-12 LAB — VITAMIN D 25 HYDROXY (VIT D DEFICIENCY, FRACTURES): Vit D, 25-Hydroxy: 27 ng/mL — ABNORMAL LOW (ref 30–100)

## 2021-12-12 LAB — TSH: TSH: 1.96 mIU/L

## 2022-01-03 ENCOUNTER — Other Ambulatory Visit: Payer: Self-pay | Admitting: Family Medicine

## 2022-01-03 ENCOUNTER — Other Ambulatory Visit: Payer: Self-pay

## 2022-01-03 DIAGNOSIS — M545 Low back pain, unspecified: Secondary | ICD-10-CM

## 2022-01-03 DIAGNOSIS — F411 Generalized anxiety disorder: Secondary | ICD-10-CM

## 2022-01-03 DIAGNOSIS — Z9884 Bariatric surgery status: Secondary | ICD-10-CM

## 2022-01-03 DIAGNOSIS — F3162 Bipolar disorder, current episode mixed, moderate: Secondary | ICD-10-CM

## 2022-01-03 DIAGNOSIS — I700 Atherosclerosis of aorta: Secondary | ICD-10-CM

## 2022-01-03 MED ORDER — GABAPENTIN 100 MG PO CAPS
ORAL_CAPSULE | ORAL | 1 refills | Status: DC
  Filled 2022-01-03: qty 180, 90d supply, fill #0
  Filled 2022-03-18: qty 180, 90d supply, fill #1

## 2022-01-03 MED ORDER — QUETIAPINE FUMARATE 200 MG PO TABS
200.0000 mg | ORAL_TABLET | Freq: Every day | ORAL | 0 refills | Status: DC
  Filled 2022-01-03: qty 30, 30d supply, fill #0

## 2022-01-03 MED ORDER — VITAMIN D 50 MCG (2000 UT) PO CAPS: 1.0000 | ORAL_CAPSULE | Freq: Every day | ORAL | 0 refills | Status: DC

## 2022-01-03 MED ORDER — ATORVASTATIN CALCIUM 40 MG PO TABS
ORAL_TABLET | Freq: Every day | ORAL | 1 refills | Status: DC
  Filled 2022-01-03: qty 90, 90d supply, fill #0
  Filled 2022-03-18: qty 90, 90d supply, fill #1

## 2022-01-03 MED ORDER — HYDROXYZINE HCL 10 MG PO TABS
ORAL_TABLET | ORAL | 0 refills | Status: DC
  Filled 2022-01-03: qty 30, 10d supply, fill #0

## 2022-01-03 MED ORDER — DULOXETINE HCL 60 MG PO CPEP
ORAL_CAPSULE | Freq: Every day | ORAL | 1 refills | Status: DC
  Filled 2022-01-03: qty 90, 90d supply, fill #0
  Filled 2022-03-18: qty 90, 90d supply, fill #1

## 2022-01-03 MED ORDER — CYCLOBENZAPRINE HCL 5 MG PO TABS
ORAL_TABLET | ORAL | 1 refills | Status: DC
  Filled 2022-01-03: qty 90, 90d supply, fill #0
  Filled 2022-02-15 – 2022-03-18 (×2): qty 90, 90d supply, fill #1

## 2022-01-08 NOTE — Progress Notes (Unsigned)
Name: Jessica [name]   MRN: [id number]    DOB: [birth year]   Date:01/09/2022       Progress Note  Subjective  Chief Complaint  Follow Up  HPI   Pre-diabetes:hgbA1C was back in 2013 was 6.3%, but down to 5.3 % with weight loss , she was gradually gaining weight after bariatric surgery ( 2012 )  and we started her on Metformin 08/2016 , followed by Trulicity but we stopped medication when she lost a lot of weight in 2021, but she has been back since early 2022 , weight is trending up again, she is still grieving the loss of her mother , but not as depressed since resumed taking her medications one month ago. She states stopped eating sugar and frustrated since unable to lose any more weight. She asked me about [place] or going up on dose of Trulicity, explained a lot of insurances are not covering for prediabetes, she may want to contact insurance to find out if they cover [facility] for weight loss prior to changing medication since she may have been grandfather in with    B12 deficiency: not currently getting B12 injections , she was taking B12 SL occasionally and last level was low . We will give her an injection today   Bipolar: no longer seeing Dr.Eappen , had a gap due to cost but since last visit 06/23 we advised her to go back due to increase in depression triggered by her mother's death and compound grief. During her last visit she was not  sleeping well, she had  lack of energy and motivation. She has been taking medications daily, spent two weeks at the beach and is starting to feel like her normal self.   Patient Active Problem List   Diagnosis Date Noted   Iron deficiency anemia 01/09/2018   Cervical high risk HPV (human papillomavirus) test positive 04/08/2017   Bariatric surgery status    Ulcer jejunum    Adenoma of left adrenal gland 02/18/2017   Abnormal TSH 08/30/2016   Bipolar disorder ([place]) 12/02/2015   HAV (hallux abducto valgus) 11/22/2015   Bunion of great toe 11/08/2015    Attention-deficit hyperactivity disorder, other type 10/28/2015   Sciatica 04/22/2015   B12 deficiency 12/01/2014   History of bariatric surgery 12/01/2014   Anxiety, generalized 12/01/2014   Gastro-esophageal reflux disease without esophagitis 12/01/2014   H/O elevated lipids 12/01/2014   H/O: HTN (hypertension) 12/01/2014   H/O: obesity 12/01/2014   Hodgkin's disease in remission ([place]) 12/01/2014   Current tobacco use 12/01/2014   Insomnia 03/24/2008   Abnormal serum level of alkaline phosphatase 11/08/2006   Chronic recurrent major depressive disorder ([place]) 11/08/2006    Past Surgical History:  Procedure Laterality Date   ABDOMINAL HYSTERECTOMY     BUNIONECTOMY Right    CARPAL TUNNEL RELEASE     COLONOSCOPY     multiple   COLONOSCOPY WITH PROPOFOL N/A 11/20/2019   Procedure: COLONOSCOPY WITH PROPOFOL;  Surgeon: Lin Landsman, MD;  Location: [facility] ENDOSCOPY;  Service: Gastroenterology;  Laterality: N/A;   ESOPHAGOGASTRODUODENOSCOPY (EGD) WITH PROPOFOL N/A 02/21/2017   Procedure: ESOPHAGOGASTRODUODENOSCOPY (EGD) WITH PROPOFOL;  Surgeon: Lucilla Lame, MD;  Location: [place];  Service: Gastroenterology;  Laterality: N/A;  Pre-diabetic   excision of neck mass  09/1998   GASTRIC BYPASS  03/12/2011   LAPAROSCOPIC SUPRACERVICAL HYSTERECTOMY  2008   and unilateral salpingoophorectomy/ endometriosis-Dr Kincius   TONSILLECTOMY      Family History  Problem Relation Age of Onset  Cancer Mother 51       colon   Epilepsy Mother    Multiple myeloma Mother    Glaucoma Father    Breast cancer Maternal Grandmother 83       inflamitory breast ca    Social History   Tobacco Use   Smoking status: Every Day    Packs/day: 0.50    Years: 16.00    Total pack years: 8.00    Types: Cigarettes, E-cigarettes    Start date: 06/11/2000    Last attempt to quit: 06/03/2020    Years since quitting: 1.6   Smokeless tobacco: Never   Tobacco comments:    Currently quitting,  transitioned from cigarettes to e-cigarettes, started at 7 and is down to 3 mg   Substance Use Topics   Alcohol use: No    Alcohol/week: 0.0 standard drinks of alcohol    Comment: rarely     Current Outpatient Medications:    Ascorbic Acid (VITAMIN C) 1000 MG tablet, Take 1 tablet (1,000 mg total) by mouth daily., Disp: 100 tablet, Rfl: 1   atorvastatin (LIPITOR) 40 MG tablet, TAKE 1 TABLET BY MOUTH DAILY., Disp: 90 tablet, Rfl: 1   Cholecalciferol (VITAMIN D) 50 MCG (2000 UT) CAPS, Take 1 capsule (2,000 Units total) by mouth daily., Disp: 30 capsule, Rfl: 0   cyclobenzaprine (FLEXERIL) 5 MG tablet, TAKE 1 TABLET BY MOUTH AT BEDTIME AS NEEDED FOR MUSCLE SPASM, Disp: 90 tablet, Rfl: 1   Dulaglutide (TRULICITY) 3 OP/9.2TW SOPN, Inject 3 mg as directed once a week., Disp: 6 mL, Rfl: 1   DULoxetine (CYMBALTA) 60 MG capsule, TAKE 1 CAPSULE BY MOUTH DAILY., Disp: 90 capsule, Rfl: 1   gabapentin (NEURONTIN) 100 MG capsule, TAKE 2 CAPSULES (200 MG) BY MOUTH AT BEDTIME., Disp: 180 capsule, Rfl: 1   hydrOXYzine (ATARAX) 10 MG tablet, Take 1 tablet by mouth three times daily as needed, Disp: 30 tablet, Rfl: 0   levothyroxine (SYNTHROID) 25 MCG tablet, Take 1 tablet (25 mcg total) by mouth daily before breakfast., Disp: 90 tablet, Rfl: 1   Multiple Vitamins-Minerals (MULTI-VITAMIN GUMMIES) CHEW, Chew 2 capsules by mouth daily., Disp: 30 tablet, Rfl: 0   omeprazole (PRILOSEC) 40 MG capsule, TAKE 1 CAPSULE BY MOUTH EVERY DAY, Disp: 90 capsule, Rfl: 1   QUEtiapine (SEROQUEL) 200 MG tablet, Take 1 tablet (200 mg total) by mouth at bedtime., Disp: 30 tablet, Rfl: 0  Allergies  Allergen Reactions   Vicodin [Hydrocodone-Acetaminophen] Other (See Comments)    Has really bad headaches/ migraine      I personally reviewed active problem list, medication list, allergies, family history, social history, health maintenance with the patient/caregiver today.   ROS   Constitutional: Negative for fever or  weight change.  Respiratory: Negative for cough and shortness of breath.   Cardiovascular: Negative for chest pain or palpitations.  Gastrointestinal: Negative for abdominal pain, no bowel changes.  Musculoskeletal: Negative for gait problem or joint swelling.  Skin: Negative for rash.  Neurological: Negative for dizziness or headache.  No other specific complaints in a complete review of systems (except as listed in HPI above).   Objective  Vitals:   01/09/22 1307  BP: 116/70  Pulse: 93  Resp: 16  SpO2: 98%  Weight: 172 lb (78 kg)  Height: $Remove'5\' 5"'cldfbUH$  (1.651 m)    Body mass index is 28.62 kg/m.  Physical Exam  Constitutional: Patient appears well-developed and well-nourished.  No distress.  HEENT: head atraumatic, normocephalic, pupils equal and reactive  to light, neck supple, throat within normal limits Cardiovascular: Normal rate, regular rhythm and normal heart sounds.  No murmur heard. No BLE edema. Pulmonary/Chest: Effort normal and breath sounds normal. No respiratory distress. Abdominal: Soft.  There is no tenderness. Psychiatric: Patient has a normal mood and affect. behavior is normal. Judgment and thought content normal.   Recent Results (from the past 2160 hour(s))  Lipid panel     Status: None   Collection Time: 12/11/21 10:58 AM  Result Value Ref Range   Cholesterol 155 <200 mg/dL   HDL 80 > OR = 50 mg/dL   Triglycerides 63 <150 mg/dL   LDL Cholesterol (Calc) 61 mg/dL (calc)    Comment: Reference range: <100 . Desirable range <100 mg/dL for primary prevention;   <70 mg/dL for patients with CHD or diabetic patients  with > or = 2 CHD risk factors. Marland Kitchen LDL-C is now calculated using the Martin-Hopkins  calculation, which is a validated novel method providing  better accuracy than the Friedewald equation in the  estimation of LDL-C.  Cresenciano Genre et al. Annamaria Helling. 9528;413(24): 2061-2068  (http://education.[facility].com/faq/FAQ164)    Total CHOL/HDL Ratio 1.9  <5.0 (calc)   Non-HDL Cholesterol (Calc) 75 <130 mg/dL (calc)    Comment: For patients with diabetes plus 1 major ASCVD risk  factor, treating to a non-HDL-C goal of <100 mg/dL  (LDL-C of <70 mg/dL) is considered a therapeutic  option.   CBC with Differential/Platelet     Status: Abnormal   Collection Time: 12/11/21 10:58 AM  Result Value Ref Range   WBC 7.1 3.8 - 10.8 Thousand/uL   RBC 4.30 3.80 - 5.10 Million/uL   Hemoglobin 11.8 11.7 - 15.5 g/dL   HCT 36.9 35.0 - 45.0 %   MCV 85.8 80.0 - 100.0 fL   MCH 27.4 27.0 - 33.0 pg   MCHC 32.0 32.0 - 36.0 g/dL   RDW 13.3 11.0 - 15.0 %   Platelets 421 (H) 140 - 400 Thousand/uL   MPV 9.5 7.5 - 12.5 fL   Neutro Abs 3,259 1,500 - 7,800 cells/uL   Lymphs Abs 3,110 850 - 3,900 cells/uL   Absolute Monocytes 419 200 - 950 cells/uL   Eosinophils Absolute 263 15 - 500 cells/uL   Basophils Absolute 50 0 - 200 cells/uL   Neutrophils Relative % 45.9 %   Total Lymphocyte 43.8 %   Monocytes Relative 5.9 %   Eosinophils Relative 3.7 %   Basophils Relative 0.7 %  COMPLETE METABOLIC PANEL WITH GFR     Status: Abnormal   Collection Time: 12/11/21 10:58 AM  Result Value Ref Range   Glucose, Bld 88 65 - 99 mg/dL    Comment: .            Fasting reference interval .    BUN 15 7 - 25 mg/dL   Creat 0.79 0.50 - 1.03 mg/dL   eGFR 89 > OR = 60 mL/min/1.47m2    Comment: The eGFR is based on the CKD-EPI 2021 equation. To calculate  the new eGFR from a previous Creatinine or Cystatin C result, go to https://www.kidney.org/professionals/ kdoqi/gfr%5Fcalculator    BUN/Creatinine Ratio NOT APPLICABLE 6 - 22 (calc)   Sodium 141 135 - 146 mmol/L   Potassium 4.6 3.5 - 5.3 mmol/L   Chloride 105 98 - 110 mmol/L   CO2 27 20 - 32 mmol/L   Calcium 9.3 8.6 - 10.4 mg/dL   Total Protein 7.1 6.1 - 8.1 g/dL   Albumin 3.9 3.6 -  5.1 g/dL   Globulin 3.2 1.9 - 3.7 g/dL (calc)   AG Ratio 1.2 1.0 - 2.5 (calc)   Total Bilirubin 0.5 0.2 - 1.2 mg/dL   Alkaline  phosphatase (APISO) 221 (H) 37 - 153 U/L   AST 14 10 - 35 U/L   ALT 10 6 - 29 U/L  VITAMIN D 25 Hydroxy (Vit-D Deficiency, Fractures)     Status: Abnormal   Collection Time: 12/11/21 10:58 AM  Result Value Ref Range   Vit D, 25-Hydroxy 27 (L) 30 - 100 ng/mL    Comment: Vitamin D Status         25-OH Vitamin D: . Deficiency:                    <20 ng/mL Insufficiency:             20 - 29 ng/mL Optimal:                 > or = 30 ng/mL . For 25-OH Vitamin D testing on patients on  D2-supplementation and patients for whom quantitation  of D2 and D3 fractions is required, the [facility](TM) 25-OH VIT D, (D2,D3), LC/MS/MS is recommended: order  code [id number] (patients >29yrs). See Note 1 . Note 1 . For additional information, please refer to  http://education.[facility].com/faq/FAQ199  (This link is being provided for informational/ educational purposes only.)   B12 and Folate Panel     Status: None   Collection Time: 12/11/21 10:58 AM  Result Value Ref Range   Vitamin B-12 247 200 - 1,100 pg/mL    Comment: . Please Note: Although the reference range for vitamin B12 is [phone] pg/mL, it has been reported that between 5 and 10% of patients with values between 200 and 400 pg/mL may experience neuropsychiatric and hematologic abnormalities due to occult B12 deficiency; less than 1% of patients with values above 400 pg/mL will have symptoms. .    Folate 7.1 ng/mL    Comment:                            Reference Range                            Low:           <3.4                            Borderline:    3.4-5.4                            Normal:        >5.4 .   TSH     Status: None   Collection Time: 12/11/21 10:58 AM  Result Value Ref Range   TSH 1.96 mIU/L    Comment:           Reference Range .           > or = 20 Years  0.40-4.50 .                Pregnancy Ranges           First trimester    0.26-2.66           Second trimester   0.55-2.73           Third  trimester    0.43-2.91   Iron, TIBC and Ferritin Panel     Status: Abnormal   Collection Time: 12/11/21 10:58 AM  Result Value Ref Range   Iron 43 (L) 45 - 160 mcg/dL   TIBC 402 250 - 450 mcg/dL (calc)   %SAT 11 (L) 16 - 45 % (calc)   Ferritin 4 (L) 16 - 232 ng/mL    PHQ2/9:    01/09/2022    1:10 PM 12/08/2021    2:46 PM 07/12/2021    1:44 PM 01/03/2021    8:37 AM 08/05/2020    2:04 PM  Depression screen PHQ 2/9  Decreased Interest 1 3 1  0 1  Down, Depressed, Hopeless 1 2 1  0 1  PHQ - 2 Score 2 5 2  0 2  Altered sleeping 0 3 1 3  0  Tired, decreased energy 0 3 1 3 1   Change in appetite 0 0 0 0 0  Feeling bad or failure about yourself  0 0 0 0 0  Trouble concentrating 0 0 0 0 0  Moving slowly or fidgety/restless 0 0 0 0 0  Suicidal thoughts 0 0 0 0 0  PHQ-9 Score 2 11 4 6 3   Difficult doing work/chores    Not difficult at all Not difficult at all    phq 9 is positive   Fall Risk:    01/09/2022    1:07 PM 12/08/2021    2:46 PM 07/12/2021    1:44 PM 01/03/2021    8:37 AM 08/05/2020    2:04 PM  Fall Risk   Falls in the past year? 0 0 0 0 0  Number falls in past yr: 0 0 0 0 0  Injury with Fall? 0 0 0 0 0  Risk for fall due to : No Fall Risks No Fall Risks No Fall Risks    Follow up Falls prevention discussed Falls prevention discussed Falls prevention discussed        Functional Status Survey: Is the patient deaf or have difficulty hearing?: No Does the patient have difficulty seeing, even when wearing glasses/contacts?: No Does the patient have difficulty concentrating, remembering, or making decisions?: No Does the patient have difficulty walking or climbing stairs?: No Does the patient have difficulty dressing or bathing?: No Does the patient have difficulty doing errands alone such as visiting a doctor's office or shopping?: No    Assessment & Plan   1. Bipolar 1 disorder, mixed, moderate (HCC)  Continue medications and schedule visit with Dr. Shea Evans, she states  she missed the phone call while at the beach   2. B12 deficiency  Today   3. History of bariatric surgery   4. Insulin resistance  - Dulaglutide (TRULICITY) 3 FS/1.4EL SOPN; Inject 3 mg as directed once a week.  Dispense: 6 mL; Refill: 1 - POCT glycosylated hemoglobin (Hb A1C)

## 2022-01-09 ENCOUNTER — Other Ambulatory Visit: Payer: Self-pay

## 2022-01-09 ENCOUNTER — Ambulatory Visit: Payer: BC Managed Care – PPO | Admitting: Family Medicine

## 2022-01-09 ENCOUNTER — Encounter: Payer: Self-pay | Admitting: Family Medicine

## 2022-01-09 VITALS — BP 116/70 | HR 93 | Resp 16 | Ht 65.0 in | Wt 172.0 lb

## 2022-01-09 DIAGNOSIS — Z9884 Bariatric surgery status: Secondary | ICD-10-CM | POA: Diagnosis not present

## 2022-01-09 DIAGNOSIS — F3162 Bipolar disorder, current episode mixed, moderate: Secondary | ICD-10-CM

## 2022-01-09 DIAGNOSIS — E8881 Metabolic syndrome: Secondary | ICD-10-CM

## 2022-01-09 DIAGNOSIS — E538 Deficiency of other specified B group vitamins: Secondary | ICD-10-CM | POA: Diagnosis not present

## 2022-01-09 LAB — POCT GLYCOSYLATED HEMOGLOBIN (HGB A1C): Hemoglobin A1C: 5.4 % (ref 4.0–5.6)

## 2022-01-09 MED ORDER — TRULICITY 3 MG/0.5ML ~~LOC~~ SOAJ
3.0000 mg | SUBCUTANEOUS | 1 refills | Status: DC
  Filled 2022-01-09: qty 6, 84d supply, fill #0

## 2022-01-09 MED ORDER — CYANOCOBALAMIN 1000 MCG/ML IJ SOLN
1000.0000 ug | Freq: Once | INTRAMUSCULAR | Status: AC
  Administered 2022-01-09: 1000 ug via INTRAMUSCULAR

## 2022-01-10 ENCOUNTER — Other Ambulatory Visit: Payer: Self-pay | Admitting: Family Medicine

## 2022-01-10 ENCOUNTER — Encounter: Payer: Self-pay | Admitting: Family Medicine

## 2022-01-10 ENCOUNTER — Other Ambulatory Visit: Payer: Self-pay

## 2022-01-10 DIAGNOSIS — E8881 Metabolic syndrome: Secondary | ICD-10-CM

## 2022-01-10 MED ORDER — SEMAGLUTIDE (1 MG/DOSE) 4 MG/3ML ~~LOC~~ SOPN
1.0000 mg | PEN_INJECTOR | SUBCUTANEOUS | 0 refills | Status: DC
  Filled 2022-01-10: qty 3, 28d supply, fill #0

## 2022-01-11 ENCOUNTER — Encounter: Payer: Self-pay | Admitting: Family Medicine

## 2022-01-15 ENCOUNTER — Other Ambulatory Visit: Payer: Self-pay

## 2022-01-15 ENCOUNTER — Telehealth: Payer: BC Managed Care – PPO | Admitting: Physician Assistant

## 2022-01-15 DIAGNOSIS — R3989 Other symptoms and signs involving the genitourinary system: Secondary | ICD-10-CM | POA: Diagnosis not present

## 2022-01-15 MED ORDER — CEPHALEXIN 500 MG PO CAPS
500.0000 mg | ORAL_CAPSULE | Freq: Two times a day (BID) | ORAL | 0 refills | Status: DC
  Filled 2022-01-15: qty 14, 7d supply, fill #0

## 2022-01-15 NOTE — Progress Notes (Signed)

## 2022-01-18 ENCOUNTER — Encounter: Payer: Self-pay | Admitting: Oncology

## 2022-01-30 ENCOUNTER — Other Ambulatory Visit: Payer: Self-pay | Admitting: Family Medicine

## 2022-01-30 ENCOUNTER — Other Ambulatory Visit: Payer: Self-pay

## 2022-01-30 DIAGNOSIS — F411 Generalized anxiety disorder: Secondary | ICD-10-CM

## 2022-01-30 DIAGNOSIS — F3162 Bipolar disorder, current episode mixed, moderate: Secondary | ICD-10-CM

## 2022-01-31 ENCOUNTER — Other Ambulatory Visit: Payer: Self-pay

## 2022-01-31 MED FILL — Quetiapine Fumarate Tab 100 MG: ORAL | 30 days supply | Qty: 30 | Fill #0 | Status: AC

## 2022-02-13 ENCOUNTER — Other Ambulatory Visit: Payer: Self-pay | Admitting: Family Medicine

## 2022-02-13 ENCOUNTER — Encounter: Payer: Self-pay | Admitting: Family Medicine

## 2022-02-13 ENCOUNTER — Other Ambulatory Visit: Payer: Self-pay

## 2022-02-13 DIAGNOSIS — E8881 Metabolic syndrome: Secondary | ICD-10-CM

## 2022-02-13 DIAGNOSIS — F3162 Bipolar disorder, current episode mixed, moderate: Secondary | ICD-10-CM

## 2022-02-13 DIAGNOSIS — F411 Generalized anxiety disorder: Secondary | ICD-10-CM

## 2022-02-13 MED ORDER — OZEMPIC (1 MG/DOSE) 4 MG/3ML ~~LOC~~ SOPN
1.0000 mg | PEN_INJECTOR | SUBCUTANEOUS | 0 refills | Status: DC
  Filled 2022-02-13: qty 3, 28d supply, fill #0

## 2022-02-14 ENCOUNTER — Other Ambulatory Visit: Payer: Self-pay

## 2022-02-15 ENCOUNTER — Other Ambulatory Visit: Payer: Self-pay | Admitting: Family Medicine

## 2022-02-15 ENCOUNTER — Other Ambulatory Visit: Payer: Self-pay

## 2022-02-15 DIAGNOSIS — F411 Generalized anxiety disorder: Secondary | ICD-10-CM

## 2022-02-27 NOTE — Progress Notes (Unsigned)
Name: Jessica [name]   MRN: [id number]    DOB: 1966-03-17   Date:02/28/2022       Progress Note  Subjective  Chief Complaint  Annual Exam  HPI  Patient presents for annual CPE.  Diet: she stopped eating sugar and lost weight again  Exercise: discussed 150 minutes per week  Last Eye Exam: yearly  Last Dental Exam: every 6 months   [place] Visit from 08/05/2020 in [facility]  AUDIT-C Score 0      Depression: Phq 9 is  positive     02/28/2022    8:21 AM 01/09/2022    1:10 PM 12/08/2021    2:46 PM 07/12/2021    1:44 PM 01/03/2021    8:37 AM  Depression screen PHQ 2/9  Decreased Interest 0 1 3 1  0  Down, Depressed, Hopeless 1 1 2 1  0  PHQ - 2 Score 1 2 5 2  0  Altered sleeping 3 0 3 1 3   Tired, decreased energy 3 0 3 1 3   Change in appetite 0 0 0 0 0  Feeling bad or failure about yourself  0 0 0 0 0  Trouble concentrating 0 0 0 0 0  Moving slowly or fidgety/restless 0 0 0 0 0  Suicidal thoughts 0 0 0 0 0  PHQ-9 Score 7 2 11 4 6   Difficult doing work/chores     Not difficult at all   Hypertension: BP Readings from Last 3 Encounters:  02/28/22 122/70  01/09/22 116/70  12/08/21 122/80   Obesity: Wt Readings from Last 3 Encounters:  02/28/22 159 lb (72.1 kg)  01/09/22 172 lb (78 kg)  12/08/21 173 lb (78.5 kg)   BMI Readings from Last 3 Encounters:  02/28/22 26.46 kg/m  01/09/22 28.62 kg/m  12/08/21 28.79 kg/m     Vaccines:   HPV: N/A Tdap: up to date Shingrix: refused  Pneumonia: N/A Flu: up to date COVID-19: refused    Hep C Screening: 11/23/19 STD testing and prevention (HIV/chl/gon/syphilis): N/A Intimate partner violence: negative screen  Sexual History :one partner , lack of libido , she has vaginal dryness  Menstrual History/LMP/Abnormal Bleeding: s/p hysterectomy supra - cervical  Discussed importance of follow up if any post-menopausal bleeding: yes  Incontinence Symptoms: negative for symptoms   Breast cancer:   - Last Mammogram: Ordered 07/12/21 - explained importance of scheduling it  - BRCA gene screening: N/A  Osteoporosis Prevention : Discussed high calcium and vitamin D supplementation, weight bearing exercises Bone density: N/A  Cervical cancer screening: Ordered today  Skin cancer: Discussed monitoring for atypical lesions  Colorectal cancer: 11/20/19   Lung cancer:  Low Dose CT Chest recommended if Age 70-80 years, 28 pack-year currently smoking OR have quit w/in 15years. Patient does not qualify for screen   ECG: 10/24/11  Advanced Care Planning: A voluntary discussion about advance care planning including the explanation and discussion of advance directives.  Discussed health care proxy and Living will, and the patient was able to identify a health care proxy as husband .  Patient does not have a living will and power of attorney of health care   Lipids: Lab Results  Component Value Date   CHOL 155 12/11/2021   CHOL 146 01/03/2021   CHOL 127 11/23/2019   Lab Results  Component Value Date   HDL 80 12/11/2021   HDL 68 01/03/2021   HDL 54 11/23/2019   Lab Results  Component Value Date   LDLCALC  61 12/11/2021   LDLCALC 62 01/03/2021   LDLCALC 54 11/23/2019   Lab Results  Component Value Date   TRIG 63 12/11/2021   TRIG 77 01/03/2021   TRIG 100 11/23/2019   Lab Results  Component Value Date   CHOLHDL 1.9 12/11/2021   CHOLHDL 2.1 01/03/2021   CHOLHDL 2.4 11/23/2019   No results found for: "LDLDIRECT"  Glucose: Glucose  Date Value Ref Range Status  03/03/2014 87 65 - 99 mg/dL Final  07/08/2013 103 (H) 65 - 99 mg/dL Final  12/03/2012 88 65 - 99 mg/dL Final   Glucose, Bld  Date Value Ref Range Status  12/11/2021 88 65 - 99 mg/dL Final    Comment:    .            Fasting reference interval .   01/03/2021 80 65 - 99 mg/dL Final    Comment:    .            Fasting reference interval .   11/23/2019 79 65 - 99 mg/dL Final    Comment:    .            Fasting  reference interval .     Patient Active Problem List   Diagnosis Date Noted   Iron deficiency anemia 01/09/2018   Cervical high risk HPV (human papillomavirus) test positive 04/08/2017   Bariatric surgery status    Ulcer jejunum    Adenoma of left adrenal gland 02/18/2017   Abnormal TSH 08/30/2016   Bipolar disorder ([place]) 12/02/2015   HAV (hallux abducto valgus) 11/22/2015   Bunion of great toe 11/08/2015   Attention-deficit hyperactivity disorder, other type 10/28/2015   Sciatica 04/22/2015   B12 deficiency 12/01/2014   History of bariatric surgery 12/01/2014   Anxiety, generalized 12/01/2014   Gastro-esophageal reflux disease without esophagitis 12/01/2014   H/O elevated lipids 12/01/2014   H/O: HTN (hypertension) 12/01/2014   H/O: obesity 12/01/2014   Hodgkin's disease in remission ([place]) 12/01/2014   Current tobacco use 12/01/2014   Insomnia 03/24/2008   Abnormal serum level of alkaline phosphatase 11/08/2006   Chronic recurrent major depressive disorder ([place]) 11/08/2006    Past Surgical History:  Procedure Laterality Date   ABDOMINAL HYSTERECTOMY     BUNIONECTOMY Right    CARPAL TUNNEL RELEASE     COLONOSCOPY     multiple   COLONOSCOPY WITH PROPOFOL N/A 11/20/2019   Procedure: COLONOSCOPY WITH PROPOFOL;  Surgeon: Lin Landsman, MD;  Location[facility] ENDOSCOPY;  Service: Gastroenterology;  Laterality: N/A;   ESOPHAGOGASTRODUODENOSCOPY (EGD) WITH PROPOFOL N/A 02/21/2017   Procedure: ESOPHAGOGASTRODUODENOSCOPY (EGD) WITH PROPOFOL;  Surgeon: Lucilla Lame, MD;  Location: [place];  Service: Gastroenterology;  Laterality: N/A;  Pre-diabetic   excision of neck mass  09/1998   GASTRIC BYPASS  03/12/2011   LAPAROSCOPIC SUPRACERVICAL HYSTERECTOMY  2008   and unilateral salpingoophorectomy/ endometriosis-Dr Kincius   TONSILLECTOMY      Family History  Problem Relation Age of Onset   Cancer Mother 90       colon   Epilepsy Mother    Multiple myeloma Mother     Glaucoma Father    Breast cancer Maternal Grandmother 83       inflamitory breast ca    Social History   Socioeconomic History   Marital status: Married    Spouse name: allen   Number of children: 2   Years of education: Not on file   Highest education level: Associate degree: occupational, Hotel manager, or  vocational program  Occupational History   Occupation: house wife   Tobacco Use   Smoking status: Every Day    Packs/day: 0.50    Years: 16.00    Total pack years: 8.00    Types: Cigarettes, E-cigarettes    Start date: 06/11/2000    Last attempt to quit: 06/03/2020    Years since quitting: 1.7   Smokeless tobacco: Never   Tobacco comments:    Currently quitting, transitioned from cigarettes to e-cigarettes, started at 7 and is down to 3 mg   Vaping Use   Vaping Use: Some days  Substance and Sexual Activity   Alcohol use: No    Alcohol/week: 0.0 standard drinks of alcohol    Comment: rarely   Drug use: No   Sexual activity: Yes    Partners: Male    Birth control/protection: Surgical  Other Topics Concern   Not on file  Social History Narrative   Not on file   Social Determinants of Health   Financial Resource Strain: Low Risk  (02/28/2022)   Overall Financial Resource Strain (CARDIA)    Difficulty of Paying Living Expenses: Not hard at all  Food Insecurity: No Food Insecurity (02/28/2022)   Hunger Vital Sign    Worried About Running Out of Food in the Last Year: Never true    Ran Out of Food in the Last Year: Never true  Transportation Needs: No Transportation Needs (02/28/2022)   PRAPARE - Hydrologist (Medical): No    Lack of Transportation (Non-Medical): No  Physical Activity: Insufficiently Active (02/28/2022)   Exercise Vital Sign    Days of Exercise per Week: 2 days    Minutes of Exercise per Session: 10 min  Stress: Stress Concern Present (02/28/2022)   [place]    Feeling of Stress : Rather much  Social Connections: Socially Integrated (02/28/2022)   Social Connection and Isolation Panel [NHANES]    Frequency of Communication with Friends and Family: More than three times a week    Frequency of Social Gatherings with Friends and Family: Once a week    Attends Religious Services: More than 4 times per year    Active Member of [employer] or Organizations: Yes    Attends Music therapist: More than 4 times per year    Marital Status: Married  Human resources officer Violence: Not At Risk (02/28/2022)   Humiliation, Afraid, Rape, and Kick questionnaire    Fear of Current or Ex-Partner: No    Emotionally Abused: No    Physically Abused: No    Sexually Abused: No     Current Outpatient Medications:    Ascorbic Acid (VITAMIN C) 1000 MG tablet, Take 1 tablet (1,000 mg total) by mouth daily., Disp: 100 tablet, Rfl: 1   atorvastatin (LIPITOR) 40 MG tablet, TAKE 1 TABLET BY MOUTH DAILY., Disp: 90 tablet, Rfl: 1   Cholecalciferol (VITAMIN D) 50 MCG (2000 UT) CAPS, Take 1 capsule (2,000 Units total) by mouth daily., Disp: 30 capsule, Rfl: 0   cyclobenzaprine (FLEXERIL) 5 MG tablet, TAKE 1 TABLET BY MOUTH AT BEDTIME AS NEEDED FOR MUSCLE SPASM, Disp: 90 tablet, Rfl: 1   DULoxetine (CYMBALTA) 60 MG capsule, TAKE 1 CAPSULE BY MOUTH DAILY., Disp: 90 capsule, Rfl: 1   gabapentin (NEURONTIN) 100 MG capsule, TAKE 2 CAPSULES (200 MG) BY MOUTH AT BEDTIME., Disp: 180 capsule, Rfl: 1   hydrOXYzine (ATARAX) 10 MG tablet, Take 1 tablet  by mouth three times daily as needed, Disp: 30 tablet, Rfl: 0   levothyroxine (SYNTHROID) 25 MCG tablet, Take 1 tablet (25 mcg total) by mouth daily before breakfast., Disp: 90 tablet, Rfl: 1   Multiple Vitamins-Minerals (MULTI-VITAMIN GUMMIES) CHEW, Chew 2 capsules by mouth daily., Disp: 30 tablet, Rfl: 0   omeprazole (PRILOSEC) 40 MG capsule, TAKE 1 CAPSULE BY MOUTH EVERY DAY, Disp: 90 capsule, Rfl: 1   QUEtiapine  (SEROQUEL) 100 MG tablet, Take 1 tablet (100 mg total) by mouth at bedtime., Disp: 30 tablet, Rfl: 0   Semaglutide, 1 MG/DOSE, (OZEMPIC, 1 MG/DOSE,) 4 MG/3ML SOPN, Inject 1 mg as directed once a week., Disp: 3 mL, Rfl: 0  Allergies  Allergen Reactions   Vicodin [Hydrocodone-Acetaminophen] Other (See Comments)    Has really bad headaches/ migraine       ROS  Constitutional: Negative for fever or weight change.  Respiratory: Negative for cough and shortness of breath.   Cardiovascular: Negative for chest pain or palpitations.  Gastrointestinal: Negative for abdominal pain, no bowel changes.  Musculoskeletal: Negative for gait problem or joint swelling.  Skin: Negative for rash.  Neurological: Negative for dizziness or headache.  No other specific complaints in a complete review of systems (except as listed in HPI above).   Objective  Vitals:   02/28/22 0815  BP: 122/70  Pulse: (!) 118  Resp: 16  SpO2: 98%  Weight: 159 lb (72.1 kg)  Height: $Remove'5\' 5"'kgxTSzd$  (1.651 m)    Body mass index is 26.46 kg/m.  Physical Exam  Constitutional: Patient appears well-developed and well-nourished. No distress.  HENT: Head: Normocephalic and atraumatic. Ears: B TMs ok, no erythema or effusion; Nose: Nose normal. Mouth/Throat: Oropharynx is clear and moist. No oropharyngeal exudate.  Eyes: Conjunctivae and EOM are normal. Pupils are equal, round, and reactive to light. No scleral icterus.  Neck: Normal range of motion. Neck supple. No JVD present. No thyromegaly present.  Cardiovascular: Normal rate, regular rhythm and normal heart sounds.  No murmur heard. No BLE edema. Pulmonary/Chest: Effort normal and breath sounds normal. No respiratory distress. Abdominal: Soft. Bowel sounds are normal, no distension. There is no tenderness. no masses Breast: no lumps or masses, no nipple discharge or rashes FEMALE GENITALIA:  She will go to gyn  RECTAL:not done  Musculoskeletal: Normal range of motion, no  joint effusions. No gross deformities Neurological: he is alert and oriented to person, place, and time. No cranial nerve deficit. Coordination, balance, strength, speech and gait are normal.  Skin: Skin is warm and dry. No rash noted. No erythema.  Psychiatric: Patient has a normal mood and affect. behavior is normal. Judgment and thought content normal.   Recent Results (from the past 2160 hour(s))  Lipid panel     Status: None   Collection Time: 12/11/21 10:58 AM  Result Value Ref Range   Cholesterol 155 <200 mg/dL   HDL 80 > OR = 50 mg/dL   Triglycerides 63 <150 mg/dL   LDL Cholesterol (Calc) 61 mg/dL (calc)    Comment: Reference range: <100 . Desirable range <100 mg/dL for primary prevention;   <70 mg/dL for patients with CHD or diabetic patients  with > or = 2 CHD risk factors. Marland Kitchen LDL-C is now calculated using the Martin-Hopkins  calculation, which is a validated novel method providing  better accuracy than the Friedewald equation in the  estimation of LDL-C.  Cresenciano Genre et al. Annamaria Helling. 2500;370(48): 2061-2068  (http://education.[facility].com/faq/FAQ164)    Total CHOL/HDL Ratio 1.9 <  5.0 (calc)   Non-HDL Cholesterol (Calc) 75 <130 mg/dL (calc)    Comment: For patients with diabetes plus 1 major ASCVD risk  factor, treating to a non-HDL-C goal of <100 mg/dL  (LDL-C of <70 mg/dL) is considered a therapeutic  option.   CBC with Differential/Platelet     Status: Abnormal   Collection Time: 12/11/21 10:58 AM  Result Value Ref Range   WBC 7.1 3.8 - 10.8 Thousand/uL   RBC 4.30 3.80 - 5.10 Million/uL   Hemoglobin 11.8 11.7 - 15.5 g/dL   HCT 36.9 35.0 - 45.0 %   MCV 85.8 80.0 - 100.0 fL   MCH 27.4 27.0 - 33.0 pg   MCHC 32.0 32.0 - 36.0 g/dL   RDW 13.3 11.0 - 15.0 %   Platelets 421 (H) 140 - 400 Thousand/uL   MPV 9.5 7.5 - 12.5 fL   Neutro Abs 3,259 1,500 - 7,800 cells/uL   Lymphs Abs 3,110 850 - 3,900 cells/uL   Absolute Monocytes 419 200 - 950 cells/uL   Eosinophils  Absolute 263 15 - 500 cells/uL   Basophils Absolute 50 0 - 200 cells/uL   Neutrophils Relative % 45.9 %   Total Lymphocyte 43.8 %   Monocytes Relative 5.9 %   Eosinophils Relative 3.7 %   Basophils Relative 0.7 %  COMPLETE METABOLIC PANEL WITH GFR     Status: Abnormal   Collection Time: 12/11/21 10:58 AM  Result Value Ref Range   Glucose, Bld 88 65 - 99 mg/dL    Comment: .            Fasting reference interval .    BUN 15 7 - 25 mg/dL   Creat 0.79 0.50 - 1.03 mg/dL   eGFR 89 > OR = 60 mL/min/1.68m2    Comment: The eGFR is based on the CKD-EPI 2021 equation. To calculate  the new eGFR from a previous Creatinine or Cystatin C result, go to https://www.kidney.org/professionals/ kdoqi/gfr%5Fcalculator    BUN/Creatinine Ratio NOT APPLICABLE 6 - 22 (calc)   Sodium 141 135 - 146 mmol/L   Potassium 4.6 3.5 - 5.3 mmol/L   Chloride 105 98 - 110 mmol/L   CO2 27 20 - 32 mmol/L   Calcium 9.3 8.6 - 10.4 mg/dL   Total Protein 7.1 6.1 - 8.1 g/dL   Albumin 3.9 3.6 - 5.1 g/dL   Globulin 3.2 1.9 - 3.7 g/dL (calc)   AG Ratio 1.2 1.0 - 2.5 (calc)   Total Bilirubin 0.5 0.2 - 1.2 mg/dL   Alkaline phosphatase (APISO) 221 (H) 37 - 153 U/L   AST 14 10 - 35 U/L   ALT 10 6 - 29 U/L  VITAMIN D 25 Hydroxy (Vit-D Deficiency, Fractures)     Status: Abnormal   Collection Time: 12/11/21 10:58 AM  Result Value Ref Range   Vit D, 25-Hydroxy 27 (L) 30 - 100 ng/mL    Comment: Vitamin D Status         25-OH Vitamin D: . Deficiency:                    <20 ng/mL Insufficiency:             20 - 29 ng/mL Optimal:                 > or = 30 ng/mL . For 25-OH Vitamin D testing on patients on  D2-supplementation and patients for whom quantitation  of D2 and D3 fractions is required, the  [facility](TM) 25-OH VIT D, (D2,D3), LC/MS/MS is recommended: order  code [id number] (patients >21yrs). See Note 1 . Note 1 . For additional information, please refer to  http://education.[facility].com/faq/FAQ199  (This  link is being provided for informational/ educational purposes only.)   B12 and Folate Panel     Status: None   Collection Time: 12/11/21 10:58 AM  Result Value Ref Range   Vitamin B-12 247 200 - 1,100 pg/mL    Comment: . Please Note: Although the reference range for vitamin B12 is [phone] pg/mL, it has been reported that between 5 and 10% of patients with values between 200 and 400 pg/mL may experience neuropsychiatric and hematologic abnormalities due to occult B12 deficiency; less than 1% of patients with values above 400 pg/mL will have symptoms. .    Folate 7.1 ng/mL    Comment:                            Reference Range                            Low:           <3.4                            Borderline:    3.4-5.4                            Normal:        >5.4 .   TSH     Status: None   Collection Time: 12/11/21 10:58 AM  Result Value Ref Range   TSH 1.96 mIU/L    Comment:           Reference Range .           > or = 20 Years  0.40-4.50 .                Pregnancy Ranges           First trimester    0.26-2.66           Second trimester   0.55-2.73           Third trimester    0.43-2.91   Iron, TIBC and Ferritin Panel     Status: Abnormal   Collection Time: 12/11/21 10:58 AM  Result Value Ref Range   Iron 43 (L) 45 - 160 mcg/dL   TIBC 402 250 - 450 mcg/dL (calc)   %SAT 11 (L) 16 - 45 % (calc)   Ferritin 4 (L) 16 - 232 ng/mL  POCT glycosylated hemoglobin (Hb A1C)     Status: None   Collection Time: 01/09/22  1:51 PM  Result Value Ref Range   Hemoglobin A1C 5.4 4.0 - 5.6 %   HbA1c POC (<> result, manual entry)     HbA1c, POC (prediabetic range)     HbA1c, POC (controlled diabetic range)       Fall Risk:    02/28/2022    8:14 AM 01/09/2022    1:07 PM 12/08/2021    2:46 PM 07/12/2021    1:44 PM 01/03/2021    8:37 AM  Fall Risk   Falls in the past year? 0 0 0 0 0  Number falls in past yr: 0 0 0 0 0  Injury with Fall? 0 0  0 0 0  Risk for fall due to : No Fall  Risks No Fall Risks No Fall Risks No Fall Risks   Follow up Falls prevention discussed Falls prevention discussed Falls prevention discussed Falls prevention discussed      Functional Status Survey: Is the patient deaf or have difficulty hearing?: No Does the patient have difficulty seeing, even when wearing glasses/contacts?: No Does the patient have difficulty concentrating, remembering, or making decisions?: No Does the patient have difficulty walking or climbing stairs?: No Does the patient have difficulty dressing or bathing?: No Does the patient have difficulty doing errands alone such as visiting a doctor's office or shopping?: No   Assessment & Plan  1. Well adult exam   2. Bipolar 1 disorder, mixed, moderate ([place])  - Ambulatory referral to Pediatric Psychiatry  3. Hodgkin's disease in remission [facility])  - Ambulatory referral to Hematology / Oncology  4. Iron deficiency  - Ambulatory referral to Hematology / Oncology    -USPSTF grade A and B recommendations reviewed with patient; age-appropriate recommendations, preventive care, screening tests, etc discussed and encouraged; healthy living encouraged; see AVS for patient education given to patient -Discussed importance of 150 minutes of physical activity weekly, eat two servings of fish weekly, eat one serving of tree nuts ( cashews, pistachios, pecans, almonds.Marland Kitchen) every other day, eat 6 servings of fruit/vegetables daily and drink plenty of water and avoid sweet beverages.   -Reviewed Health Maintenance: Yes.

## 2022-02-27 NOTE — Patient Instructions (Signed)
Preventive Care 40-55 Years Old, Female Preventive care refers to lifestyle choices and visits with your health care provider that can promote health and wellness. Preventive care visits are also called wellness exams. What can I expect for my preventive care visit? Counseling Your health care provider may ask you questions about your: Medical history, including: Past medical problems. Family medical history. Pregnancy history. Current health, including: Menstrual cycle. Method of birth control. Emotional well-being. Home life and relationship well-being. Sexual activity and sexual health. Lifestyle, including: Alcohol, nicotine or tobacco, and drug use. Access to firearms. Diet, exercise, and sleep habits. Work and work environment. Sunscreen use. Safety issues such as seatbelt and bike helmet use. Physical exam Your health care provider will check your: Height and weight. These may be used to calculate your BMI (body mass index). BMI is a measurement that tells if you are at a healthy weight. Waist circumference. This measures the distance around your waistline. This measurement also tells if you are at a healthy weight and may help predict your risk of certain diseases, such as type 2 diabetes and high blood pressure. Heart rate and blood pressure. Body temperature. Skin for abnormal spots. What immunizations do I need?  Vaccines are usually given at various ages, according to a schedule. Your health care provider will recommend vaccines for you based on your age, medical history, and lifestyle or other factors, such as travel or where you work. What tests do I need? Screening Your health care provider may recommend screening tests for certain conditions. This may include: Lipid and cholesterol levels. Diabetes screening. This is done by checking your blood sugar (glucose) after you have not eaten for a while (fasting). Pelvic exam and Pap test. Hepatitis B test. Hepatitis C  test. HIV (human immunodeficiency virus) test. STI (sexually transmitted infection) testing, if you are at risk. Lung cancer screening. Colorectal cancer screening. Mammogram. Talk with your health care provider about when you should start having regular mammograms. This may depend on whether you have a family history of breast cancer. BRCA-related cancer screening. This may be done if you have a family history of breast, ovarian, tubal, or peritoneal cancers. Bone density scan. This is done to screen for osteoporosis. Talk with your health care provider about your test results, treatment options, and if necessary, the need for more tests. Follow these instructions at home: Eating and drinking  Eat a diet that includes fresh fruits and vegetables, whole grains, lean protein, and low-fat dairy products. Take vitamin and mineral supplements as recommended by your health care provider. Do not drink alcohol if: Your health care provider tells you not to drink. You are pregnant, may be pregnant, or are planning to become pregnant. If you drink alcohol: Limit how much you have to 0-1 drink a day. Know how much alcohol is in your drink. In the [place] one drink equals one 12 oz bottle of beer (355 mL), one 5 oz glass of wine (148 mL), or one 1 oz glass of hard liquor (44 mL). Lifestyle Brush your teeth every morning and night with fluoride toothpaste. Floss one time each day. Exercise for at least 30 minutes 5 or more days each week. Do not use any products that contain nicotine or tobacco. These products include cigarettes, chewing tobacco, and vaping devices, such as e-cigarettes. If you need help quitting, ask your health care provider. Do not use drugs. If you are sexually active, practice safe sex. Use a condom or other form of protection to   prevent STIs. If you do not wish to become pregnant, use a form of birth control. If you plan to become pregnant, see your health care provider for a  prepregnancy visit. Take aspirin only as told by your health care provider. Make sure that you understand how much to take and what form to take. Work with your health care provider to find out whether it is safe and beneficial for you to take aspirin daily. Find healthy ways to manage stress, such as: Meditation, yoga, or listening to music. Journaling. Talking to a trusted person. Spending time with friends and family. Minimize exposure to UV radiation to reduce your risk of skin cancer. Safety Always wear your seat belt while driving or riding in a vehicle. Do not drive: If you have been drinking alcohol. Do not ride with someone who has been drinking. When you are tired or distracted. While texting. If you have been using any mind-altering substances or drugs. Wear a helmet and other protective equipment during sports activities. If you have firearms in your house, make sure you follow all gun safety procedures. Seek help if you have been physically or sexually abused. What's next? Visit your health care provider once a year for an annual wellness visit. Ask your health care provider how often you should have your eyes and teeth checked. Stay up to date on all vaccines. This information is not intended to replace advice given to you by your health care provider. Make sure you discuss any questions you have with your health care provider. Document Revised: 11/23/2020 Document Reviewed: 11/23/2020 Elsevier Patient Education  [place].

## 2022-02-28 ENCOUNTER — Encounter: Payer: Self-pay | Admitting: Family Medicine

## 2022-02-28 ENCOUNTER — Ambulatory Visit (INDEPENDENT_AMBULATORY_CARE_PROVIDER_SITE_OTHER): Payer: BC Managed Care – PPO | Admitting: Family Medicine

## 2022-02-28 VITALS — BP 122/70 | HR 118 | Resp 16 | Ht 65.0 in | Wt 159.0 lb

## 2022-02-28 DIAGNOSIS — Z0000 Encounter for general adult medical examination without abnormal findings: Secondary | ICD-10-CM

## 2022-02-28 DIAGNOSIS — E611 Iron deficiency: Secondary | ICD-10-CM

## 2022-02-28 DIAGNOSIS — F3162 Bipolar disorder, current episode mixed, moderate: Secondary | ICD-10-CM | POA: Diagnosis not present

## 2022-02-28 DIAGNOSIS — C8190 Hodgkin lymphoma, unspecified, unspecified site: Secondary | ICD-10-CM

## 2022-02-28 DIAGNOSIS — Z124 Encounter for screening for malignant neoplasm of cervix: Secondary | ICD-10-CM

## 2022-03-02 NOTE — Progress Notes (Unsigned)
Name: Jessica [name]   MRN: [id number]    DOB: 01/28/1966   Date:03/05/2022       Progress Note  Subjective  Chief Complaint  Follow Up  HPI  Pre-diabetes:hgbA1C was back in 2013 was 6.3%, but down to 5.3 % with weight loss , she was gradually gaining weight after bariatric surgery ( 2012 )  and we started her on Metformin 08/2016 , followed by Trulicity but we stopped medication when she lost a lot of weight in 2021, gradually started to gain weight again and we started her back on Ozempic July 2023, she will go up on dose in 2 weeks. Weight is down 13 lbs since resumed GLP-1 agonist in July    B12 deficiency: she is taking SL B12 last level was 247. She would like get a B12 injections today  Bipolar: no longer seeing Dr.Eappen , had a gap due to cost but since last visit 06/23 we advised her to go back due to increase in depression triggered by her mother's death and compound grief, however she has been dismissed from Dr. Elvera Maria practice from lack of follow up. We placed a new referral for a [facility] and appointment not schedule yet. Currently on Duloxetine 60 mg , gabapentin 200 mg at night , hydroxizine prn   Adult hypothyroidism: : she has been on medication since 2020, last TSH was at goal at 1.96 . She has fatigue but likely multifactorial. No change in bowel movements .    Hodgkin's lymphoma:she went back to see Dr. Cathie Hoops in 2019 , last CBC was normal, iron storage was low ,we will recheck labs today    Atherosclerosis of aorta: she is on Atorvastatin and denies side effects, last LDL at goal , last LDL was 61   Iron deficiency anemia: history of jejunum ulcer, going to see Dr. Cathie Hoops on Monday  Vitamin D deficiency: she is taking supplementation.    Patient Active Problem List   Diagnosis Date Noted   Iron deficiency anemia 01/09/2018   Cervical high risk HPV (human papillomavirus) test positive 04/08/2017   Bariatric surgery status    Ulcer jejunum    Adenoma of left  adrenal gland 02/18/2017   Abnormal TSH 08/30/2016   Bipolar disorder (HCC) 12/02/2015   HAV (hallux abducto valgus) 11/22/2015   Bunion of great toe 11/08/2015   Attention-deficit hyperactivity disorder, other type 10/28/2015   Sciatica 04/22/2015   B12 deficiency 12/01/2014   History of bariatric surgery 12/01/2014   Anxiety, generalized 12/01/2014   Gastro-esophageal reflux disease without esophagitis 12/01/2014   H/O elevated lipids 12/01/2014   H/O: HTN (hypertension) 12/01/2014   H/O: obesity 12/01/2014   Hodgkin's disease in remission (HCC) 12/01/2014   Current tobacco use 12/01/2014   Insomnia 03/24/2008   Abnormal serum level of alkaline phosphatase 11/08/2006   Chronic recurrent major depressive disorder (HCC) 11/08/2006    Past Surgical History:  Procedure Laterality Date   ABDOMINAL HYSTERECTOMY     BUNIONECTOMY Right    CARPAL TUNNEL RELEASE     COLONOSCOPY     multiple   COLONOSCOPY WITH PROPOFOL N/A 11/20/2019   Procedure: COLONOSCOPY WITH PROPOFOL;  Surgeon: Toney Reil, MD;  Location[facility] ENDOSCOPY;  Service: Gastroenterology;  Laterality: N/A;   ESOPHAGOGASTRODUODENOSCOPY (EGD) WITH PROPOFOL N/A 02/21/2017   Procedure: ESOPHAGOGASTRODUODENOSCOPY (EGD) WITH PROPOFOL;  Surgeon: Midge Minium, MD;  Location: [facility];  Service: Gastroenterology;  Laterality: N/A;  Pre-diabetic   excision of neck mass  09/1998  GASTRIC BYPASS  03/12/2011   LAPAROSCOPIC SUPRACERVICAL HYSTERECTOMY  2008   and unilateral salpingoophorectomy/ endometriosis-Dr Kincius   TONSILLECTOMY      Family History  Problem Relation Age of Onset   Cancer Mother 37       colon   Epilepsy Mother    Multiple myeloma Mother    Glaucoma Father    Breast cancer Maternal Grandmother 83       inflamitory breast ca    Social History   Tobacco Use   Smoking status: Every Day    Packs/day: 0.50    Years: 16.00    Total pack years: 8.00    Types: Cigarettes, E-cigarettes     Start date: 06/11/2000    Last attempt to quit: 06/03/2020    Years since quitting: 1.7   Smokeless tobacco: Never   Tobacco comments:    Currently quitting, transitioned from cigarettes to e-cigarettes, started at 7 and is down to 3 mg   Substance Use Topics   Alcohol use: No    Alcohol/week: 0.0 standard drinks of alcohol    Comment: rarely     Current Outpatient Medications:    Ascorbic Acid (VITAMIN C) 1000 MG tablet, Take 1 tablet (1,000 mg total) by mouth daily., Disp: 100 tablet, Rfl: 1   atorvastatin (LIPITOR) 40 MG tablet, TAKE 1 TABLET BY MOUTH DAILY., Disp: 90 tablet, Rfl: 1   Cholecalciferol (VITAMIN D) 50 MCG (2000 UT) CAPS, Take 1 capsule (2,000 Units total) by mouth daily., Disp: 30 capsule, Rfl: 0   cyclobenzaprine (FLEXERIL) 5 MG tablet, TAKE 1 TABLET BY MOUTH AT BEDTIME AS NEEDED FOR MUSCLE SPASM, Disp: 90 tablet, Rfl: 1   DULoxetine (CYMBALTA) 60 MG capsule, TAKE 1 CAPSULE BY MOUTH DAILY., Disp: 90 capsule, Rfl: 1   gabapentin (NEURONTIN) 100 MG capsule, TAKE 2 CAPSULES (200 MG) BY MOUTH AT BEDTIME., Disp: 180 capsule, Rfl: 1   hydrOXYzine (ATARAX) 10 MG tablet, Take 1 tablet by mouth three times daily as needed, Disp: 30 tablet, Rfl: 0   levothyroxine (SYNTHROID) 25 MCG tablet, Take 1 tablet (25 mcg total) by mouth daily before breakfast., Disp: 90 tablet, Rfl: 1   Multiple Vitamins-Minerals (MULTI-VITAMIN GUMMIES) CHEW, Chew 2 capsules by mouth daily., Disp: 30 tablet, Rfl: 0   omeprazole (PRILOSEC) 40 MG capsule, TAKE 1 CAPSULE BY MOUTH EVERY DAY, Disp: 90 capsule, Rfl: 1   QUEtiapine (SEROQUEL) 100 MG tablet, Take 1 tablet (100 mg total) by mouth at bedtime., Disp: 30 tablet, Rfl: 0   Semaglutide, 1 MG/DOSE, (OZEMPIC, 1 MG/DOSE,) 4 MG/3ML SOPN, Inject 1 mg as directed once a week., Disp: 3 mL, Rfl: 0  Allergies  Allergen Reactions   Vicodin [Hydrocodone-Acetaminophen] Other (See Comments)    Has really bad headaches/ migraine      I personally reviewed active  problem list, medication list, allergies, family history, social history, health maintenance with the patient/caregiver today.   ROS  Ten systems reviewed and is negative except as mentioned in HPI   Objective  Vitals:   03/05/22 1544  BP: 132/74  Pulse: 98  Resp: 14  Temp: 98.5 F (36.9 C)  TempSrc: Oral  SpO2: 100%  Weight: 160 lb 6.4 oz (72.8 kg)  Height: $Remove'5\' 5"'qbZamGG$  (1.651 m)    Body mass index is 26.69 kg/m.  Physical Exam   Constitutional: Patient appears well-developed and well-nourished.  No distress.  HEENT: head atraumatic, normocephalic, pupils equal and reactive to light,  neck supple Cardiovascular: Normal rate, regular rhythm  and normal heart sounds.  No murmur heard. No BLE edema. Pulmonary/Chest: Effort normal and breath sounds normal. No respiratory distress. Abdominal: Soft.  There is no tenderness. Psychiatric: Patient has a normal mood and affect. behavior is normal. Judgment and thought content normal.    PHQ2/9:    02/28/2022    8:21 AM 01/09/2022    1:10 PM 12/08/2021    2:46 PM 07/12/2021    1:44 PM 01/03/2021    8:37 AM  Depression screen PHQ 2/9  Decreased Interest 0 1 3 1  0  Down, Depressed, Hopeless 1 1 2 1  0  PHQ - 2 Score 1 2 5 2  0  Altered sleeping 3 0 3 1 3   Tired, decreased energy 3 0 3 1 3   Change in appetite 0 0 0 0 0  Feeling bad or failure about yourself  0 0 0 0 0  Trouble concentrating 0 0 0 0 0  Moving slowly or fidgety/restless 0 0 0 0 0  Suicidal thoughts 0 0 0 0 0  PHQ-9 Score 7 2 11 4 6   Difficult doing work/chores     Not difficult at all    phq 9 is positive   Fall Risk:    03/05/2022    3:40 PM 02/28/2022    8:14 AM 01/09/2022    1:07 PM 12/08/2021    2:46 PM 07/12/2021    1:44 PM  Fall Risk   Falls in the past year? 0 0 0 0 0  Number falls in past yr: 0 0 0 0 0  Injury with Fall? 0 0 0 0 0  Risk for fall due to : No Fall Risks No Fall Risks No Fall Risks No Fall Risks No Fall Risks  Follow up Falls prevention  discussed Falls prevention discussed Falls prevention discussed Falls prevention discussed Falls prevention discussed      Functional Status Survey: Is the patient deaf or have difficulty hearing?: No Does the patient have difficulty seeing, even when wearing glasses/contacts?: No Does the patient have difficulty concentrating, remembering, or making decisions?: No Does the patient have difficulty walking or climbing stairs?: No Does the patient have difficulty dressing or bathing?: No Does the patient have difficulty doing errands alone such as visiting a doctor's office or shopping?: No    Assessment & Plan  1. Bipolar 1 disorder, mixed, moderate (HCC)  - QUEtiapine (SEROQUEL) 100 MG tablet; Take 1 tablet (100 mg total) by mouth at bedtime.  Dispense: 90 tablet; Refill: 0  2. GAD (generalized anxiety disorder)  - QUEtiapine (SEROQUEL) 100 MG tablet; Take 1 tablet (100 mg total) by mouth at bedtime.  Dispense: 90 tablet; Refill: 0  3. Hodgkin's disease in remission [facility])  Follow with hematologist   4. Adult hypothyroidism  - levothyroxine (SYNTHROID) 25 MCG tablet; Take 1 tablet (25 mcg total) by mouth daily before breakfast.  Dispense: 90 tablet; Refill: 1  5. Vitamin D deficiency   6. Gastro-esophageal reflux disease without esophagitis   7. History of bariatric surgery

## 2022-03-05 ENCOUNTER — Ambulatory Visit (INDEPENDENT_AMBULATORY_CARE_PROVIDER_SITE_OTHER): Payer: BC Managed Care – PPO | Admitting: Family Medicine

## 2022-03-05 ENCOUNTER — Other Ambulatory Visit: Payer: Self-pay

## 2022-03-05 ENCOUNTER — Encounter: Payer: Self-pay | Admitting: Family Medicine

## 2022-03-05 VITALS — BP 132/74 | HR 98 | Temp 98.5°F | Resp 14 | Ht 65.0 in | Wt 160.4 lb

## 2022-03-05 DIAGNOSIS — Z9884 Bariatric surgery status: Secondary | ICD-10-CM

## 2022-03-05 DIAGNOSIS — C8190 Hodgkin lymphoma, unspecified, unspecified site: Secondary | ICD-10-CM | POA: Diagnosis not present

## 2022-03-05 DIAGNOSIS — F411 Generalized anxiety disorder: Secondary | ICD-10-CM | POA: Diagnosis not present

## 2022-03-05 DIAGNOSIS — F3162 Bipolar disorder, current episode mixed, moderate: Secondary | ICD-10-CM

## 2022-03-05 DIAGNOSIS — K219 Gastro-esophageal reflux disease without esophagitis: Secondary | ICD-10-CM

## 2022-03-05 DIAGNOSIS — E039 Hypothyroidism, unspecified: Secondary | ICD-10-CM | POA: Diagnosis not present

## 2022-03-05 DIAGNOSIS — C819A Hodgkin lymphoma, unspecified, in remission: Secondary | ICD-10-CM

## 2022-03-05 DIAGNOSIS — E559 Vitamin D deficiency, unspecified: Secondary | ICD-10-CM

## 2022-03-05 MED ORDER — QUETIAPINE FUMARATE 100 MG PO TABS
100.0000 mg | ORAL_TABLET | Freq: Every day | ORAL | 0 refills | Status: DC
  Filled 2022-03-05: qty 90, 90d supply, fill #0

## 2022-03-05 MED ORDER — LEVOTHYROXINE SODIUM 25 MCG PO TABS
25.0000 ug | ORAL_TABLET | Freq: Every day | ORAL | 1 refills | Status: DC
  Filled 2022-03-05: qty 90, 90d supply, fill #0
  Filled 2022-03-18 – 2022-05-07 (×3): qty 90, 90d supply, fill #1

## 2022-03-05 NOTE — Patient Instructions (Signed)
Referral has been sent to [facility]   P: [phone] F: [phone]   Release ID # [id number]

## 2022-03-12 ENCOUNTER — Encounter: Payer: Self-pay | Admitting: Oncology

## 2022-03-12 ENCOUNTER — Inpatient Hospital Stay: Payer: BC Managed Care – PPO | Attending: Oncology | Admitting: Oncology

## 2022-03-12 ENCOUNTER — Inpatient Hospital Stay: Payer: BC Managed Care – PPO

## 2022-03-12 VITALS — BP 153/95 | HR 104 | Temp 98.1°F | Resp 16 | Ht 65.0 in | Wt 159.0 lb

## 2022-03-12 DIAGNOSIS — Z9884 Bariatric surgery status: Secondary | ICD-10-CM | POA: Diagnosis not present

## 2022-03-12 DIAGNOSIS — Z79899 Other long term (current) drug therapy: Secondary | ICD-10-CM | POA: Insufficient documentation

## 2022-03-12 DIAGNOSIS — D509 Iron deficiency anemia, unspecified: Secondary | ICD-10-CM | POA: Insufficient documentation

## 2022-03-12 DIAGNOSIS — C8190 Hodgkin lymphoma, unspecified, unspecified site: Secondary | ICD-10-CM | POA: Insufficient documentation

## 2022-03-12 DIAGNOSIS — D508 Other iron deficiency anemias: Secondary | ICD-10-CM | POA: Diagnosis not present

## 2022-03-12 DIAGNOSIS — F17210 Nicotine dependence, cigarettes, uncomplicated: Secondary | ICD-10-CM | POA: Insufficient documentation

## 2022-03-12 LAB — RETIC PANEL
Immature Retic Fract: 7.3 % (ref 2.3–15.9)
RBC.: 4.45 MIL/uL (ref 3.87–5.11)
Retic Count, Absolute: 45.8 10*3/uL (ref 19.0–186.0)
Retic Ct Pct: 1 % (ref 0.4–3.1)
Reticulocyte Hemoglobin: 33.2 pg (ref >27.9)

## 2022-03-12 LAB — CBC WITH DIFFERENTIAL/PLATELET
Abs Immature Granulocytes: 0.05 10*3/uL (ref 0.00–0.07)
Basophils Absolute: 0 10*3/uL (ref 0.0–0.1)
Basophils Relative: 1 %
Eosinophils Absolute: 0.2 10*3/uL (ref 0.0–0.5)
Eosinophils Relative: 3 %
HCT: 38.8 % (ref 36.0–46.0)
Hemoglobin: 12.3 g/dL (ref 12.0–15.0)
Immature Granulocytes: 1 %
Lymphocytes Relative: 44 %
Lymphs Abs: 3.2 10*3/uL (ref 0.7–4.0)
MCH: 27.5 pg (ref 26.0–34.0)
MCHC: 31.7 g/dL (ref 30.0–36.0)
MCV: 86.6 fL (ref 80.0–100.0)
Monocytes Absolute: 0.4 10*3/uL (ref 0.1–1.0)
Monocytes Relative: 6 %
Neutro Abs: 3.1 10*3/uL (ref 1.7–7.7)
Neutrophils Relative %: 45 %
Platelets: 284 10*3/uL (ref 150–400)
RBC: 4.48 MIL/uL (ref 3.87–5.11)
RDW: 15 % (ref 11.5–15.5)
WBC: 7 10*3/uL (ref 4.0–10.5)
nRBC: 0 % (ref 0.0–0.2)

## 2022-03-12 LAB — IRON AND TIBC
Iron: 52 ug/dL (ref 28–170)
Saturation Ratios: 14 % (ref 10.4–31.8)
TIBC: 384 ug/dL (ref 250–450)
UIBC: 332 ug/dL

## 2022-03-12 LAB — FERRITIN: Ferritin: 6 ng/mL — ABNORMAL LOW (ref 11–307)

## 2022-03-12 NOTE — Progress Notes (Signed)
Hematology/Oncology Consult note [phone] [phone]         Patient Care Team: Steele Sizer, MD as PCP - General (Family Medicine) Dalia Heading, CNM (Inactive) as Midwife (Certified Nurse Midwife) Lucilla Lame, MD as Consulting Physician (Gastroenterology) Cathi Roan, [facility] (Inactive) (Pharmacist)  REFERRING PROVIDER: Steele Sizer, MD   CHIEF COMPLAINTS/REASON FOR VISIT:  Evaluation of Iron deficiency anemia, history of Hodgkin's lymphoma.   HISTORY OF PRESENTING ILLNESS:   Jessica [name] is a  56 y.o.  female with PMH listed below was seen in consultation at the request of  Steele Sizer, MD  for evaluation of Iron deficiency anemia, history of Hodgkin's lymphoma  Patient was last seen by me on 02/26/2018. Lost follow up . She presents to re establish care.  12/11/2021 cbc showed Hb 11.8, ferritin 4, iron saturation 11.   History of Hodgkin lymphoma, diagnosed in 2000, treated with ABVD x 4 cycles and mantle radiation. History of gastric bypass in 2013, reports feeling chronic intermittent epigastric pain for the past 12 months.  In September 2019 she went to ER for epigastric pain evaluation. CT abdomen Pelvis showed essentric wall thickening at the gastrojejunal anastomosis. Non specific new mild gastrohepatic ligament lymphadenopathy.  Stable left adrenal adenoma.   Denies hematochezia, hematuria, hematemesis, epistaxis, black tarry stool or easy bruising.    MEDICAL HISTORY:  Past Medical History:  Diagnosis Date   ADHD (attention deficit hyperactivity disorder)    Anxiety    Bipolar 1 disorder ([place])    Cancer ([place])    Hodgkin's Lymphoma 09/1998   Depression    Endometriosis    GERD (gastroesophageal reflux disease)    History of dysplastic nevus 05/04/2020   right abdomen (side) upper/severe   History of gastric bypass 2012   Hodgkin's lymphoma ([place]) 2000   Hypothyroidism    Pre-diabetes    Tobacco use disorder     Ulcer jejunum     SURGICAL HISTORY: Past Surgical History:  Procedure Laterality Date   ABDOMINAL HYSTERECTOMY     BUNIONECTOMY Right    CARPAL TUNNEL RELEASE     COLONOSCOPY     multiple   COLONOSCOPY WITH PROPOFOL N/A 11/20/2019   Procedure: COLONOSCOPY WITH PROPOFOL;  Surgeon: Lin Landsman, MD;  Location: [place];  Service: Gastroenterology;  Laterality: N/A;   ESOPHAGOGASTRODUODENOSCOPY (EGD) WITH PROPOFOL N/A 02/21/2017   Procedure: ESOPHAGOGASTRODUODENOSCOPY (EGD) WITH PROPOFOL;  Surgeon: Lucilla Lame, MD;  Location: [place];  Service: Gastroenterology;  Laterality: N/A;  Pre-diabetic   excision of neck mass  09/1998   GASTRIC BYPASS  03/12/2011   LAPAROSCOPIC SUPRACERVICAL HYSTERECTOMY  2008   and unilateral salpingoophorectomy/ endometriosis-Dr Kincius   TONSILLECTOMY      SOCIAL HISTORY: Social History   Socioeconomic History   Marital status: Married    Spouse name: Engineer, materials   Number of children: 2   Years of education: Not on file   Highest education level: Associate degree: occupational, Hotel manager, or vocational program  Occupational History   Occupation: house wife   Tobacco Use   Smoking status: Every Day    Packs/day: 0.50    Years: 16.00    Total pack years: 8.00    Types: Cigarettes, E-cigarettes    Start date: 06/11/2000    Last attempt to quit: 06/03/2020    Years since quitting: 1.7   Smokeless tobacco: Never   Tobacco comments:    Currently quitting, transitioned from cigarettes to e-cigarettes, started at 7 and is down to  3 mg   Vaping Use   Vaping Use: Some days  Substance and Sexual Activity   Alcohol use: No    Alcohol/week: 0.0 standard drinks of alcohol    Comment: rarely   Drug use: No   Sexual activity: Yes    Partners: Male    Birth control/protection: Surgical  Other Topics Concern   Not on file  Social History Narrative   Not on file   Social Determinants of Health   Financial Resource Strain: Low Risk   (02/28/2022)   Overall Financial Resource Strain (CARDIA)    Difficulty of Paying Living Expenses: Not hard at all  Food Insecurity: No Food Insecurity (02/28/2022)   Hunger Vital Sign    Worried About Running Out of Food in the Last Year: Never true    Ran Out of Food in the Last Year: Never true  Transportation Needs: No Transportation Needs (02/28/2022)   PRAPARE - Hydrologist (Medical): No    Lack of Transportation (Non-Medical): No  Physical Activity: Insufficiently Active (02/28/2022)   Exercise Vital Sign    Days of Exercise per Week: 2 days    Minutes of Exercise per Session: 10 min  Stress: Stress Concern Present (02/28/2022)   [place]    Feeling of Stress : Rather much  Social Connections: Socially Integrated (02/28/2022)   Social Connection and Isolation Panel [NHANES]    Frequency of Communication with Friends and Family: More than three times a week    Frequency of Social Gatherings with Friends and Family: Once a week    Attends Religious Services: More than 4 times per year    Active Member of [employer] or Organizations: Yes    Attends Music therapist: More than 4 times per year    Marital Status: Married  Human resources officer Violence: Not At Risk (02/28/2022)   Humiliation, Afraid, Rape, and Kick questionnaire    Fear of Current or Ex-Partner: No    Emotionally Abused: No    Physically Abused: No    Sexually Abused: No    FAMILY HISTORY: Family History  Problem Relation Age of Onset   Cancer Mother 47       colon   Epilepsy Mother    Multiple myeloma Mother    Glaucoma Father    Breast cancer Maternal Grandmother 83       inflamitory breast ca    ALLERGIES:  is allergic to vicodin [hydrocodone-acetaminophen].  MEDICATIONS:  Current Outpatient Medications  Medication Sig Dispense Refill   Ascorbic Acid (VITAMIN C) 1000 MG tablet Take 1 tablet (1,000 mg  total) by mouth daily. 100 tablet 1   atorvastatin (LIPITOR) 40 MG tablet TAKE 1 TABLET BY MOUTH DAILY. 90 tablet 1   Cholecalciferol (VITAMIN D) 50 MCG (2000 UT) CAPS Take 1 capsule (2,000 Units total) by mouth daily. 30 capsule 0   cyclobenzaprine (FLEXERIL) 5 MG tablet TAKE 1 TABLET BY MOUTH AT BEDTIME AS NEEDED FOR MUSCLE SPASM 90 tablet 1   DULoxetine (CYMBALTA) 60 MG capsule TAKE 1 CAPSULE BY MOUTH DAILY. 90 capsule 1   gabapentin (NEURONTIN) 100 MG capsule TAKE 2 CAPSULES (200 MG) BY MOUTH AT BEDTIME. 180 capsule 1   hydrOXYzine (ATARAX) 10 MG tablet Take 1 tablet by mouth three times daily as needed 30 tablet 0   levothyroxine (SYNTHROID) 25 MCG tablet Take 1 tablet (25 mcg total) by mouth daily before breakfast. 90 tablet 1  Multiple Vitamins-Minerals (MULTI-VITAMIN GUMMIES) CHEW Chew 2 capsules by mouth daily. 30 tablet 0   omeprazole (PRILOSEC) 40 MG capsule TAKE 1 CAPSULE BY MOUTH EVERY DAY 90 capsule 1   QUEtiapine (SEROQUEL) 100 MG tablet Take 1 tablet (100 mg total) by mouth at bedtime. 90 tablet 0   Semaglutide, 1 MG/DOSE, (OZEMPIC, 1 MG/DOSE,) 4 MG/3ML SOPN Inject 1 mg as directed once a week. 3 mL 0   No current facility-administered medications for this visit.    Review of Systems  Constitutional:  Positive for fatigue. Negative for appetite change, chills and fever.  HENT:   Negative for hearing loss and voice change.   Eyes:  Negative for eye problems.  Respiratory:  Negative for chest tightness and cough.   Cardiovascular:  Negative for chest pain.  Gastrointestinal:  Negative for abdominal distention, abdominal pain and blood in stool.  Endocrine: Negative for hot flashes.  Genitourinary:  Negative for difficulty urinating and frequency.   Musculoskeletal:  Negative for arthralgias.  Skin:  Negative for itching and rash.  Neurological:  Negative for extremity weakness.  Hematological:  Negative for adenopathy.  Psychiatric/Behavioral:  Negative for confusion.     PHYSICAL EXAMINATION: ECOG PERFORMANCE STATUS: 0 - Asymptomatic Vitals:   03/12/22 1541  BP: (!) 153/95  Pulse: (!) 104  Resp: 16  Temp: 98.1 F (36.7 C)  SpO2: 100%   Filed Weights   03/12/22 1541  Weight: 159 lb (72.1 kg)    Physical Exam Constitutional:      General: She is not in acute distress. HENT:     Head: Normocephalic and atraumatic.  Eyes:     General: No scleral icterus. Cardiovascular:     Rate and Rhythm: Normal rate and regular rhythm.     Heart sounds: Normal heart sounds.  Pulmonary:     Effort: Pulmonary effort is normal. No respiratory distress.     Breath sounds: No wheezing.  Abdominal:     General: Bowel sounds are normal. There is no distension.     Palpations: Abdomen is soft.     Comments: Epigastric tenderness  Musculoskeletal:        General: No deformity. Normal range of motion.     Cervical back: Normal range of motion and neck supple.  Skin:    General: Skin is warm and dry.     Findings: No erythema or rash.  Neurological:     Mental Status: She is alert and oriented to person, place, and time. Mental status is at baseline.     Cranial Nerves: No cranial nerve deficit.     Coordination: Coordination normal.  Psychiatric:        Mood and Affect: Mood normal.     LABORATORY DATA:  I have reviewed the data as listed    Latest Ref Rng & Units 03/12/2022    4:03 PM 12/11/2021   10:58 AM 01/03/2021    9:14 AM  CBC  WBC 4.0 - 10.5 K/uL 7.0  7.1  7.1   Hemoglobin 12.0 - 15.0 g/dL 12.3  11.8  12.5   Hematocrit 36.0 - 46.0 % 38.8  36.9  38.3   Platelets 150 - 400 K/uL 284  421  330       Latest Ref Rng & Units 12/11/2021   10:58 AM 01/03/2021    9:14 AM 11/23/2019    3:45 PM  CMP  Glucose 65 - 99 mg/dL 88  80  79   BUN 7 - 25 mg/dL  $'15  11  9   'M$ Creatinine 0.50 - 1.03 mg/dL 0.79  0.71  0.58   Sodium 135 - 146 mmol/L 141  141  139   Potassium 3.5 - 5.3 mmol/L 4.6  4.4  4.5   Chloride 98 - 110 mmol/L 105  104  105   CO2 20 - 32  mmol/L $Remov'27  29  29   'wgdtXQ$ Calcium 8.6 - 10.4 mg/dL 9.3  9.2  8.9   Total Protein 6.1 - 8.1 g/dL 7.1  6.8  6.8   Total Bilirubin 0.2 - 1.2 mg/dL 0.5  0.7  0.5   AST 10 - 35 U/L $Remo'14  14  17   'oRFZC$ ALT 6 - 29 U/L $Remo'10  8  15       'McnhM$ RADIOGRAPHIC STUDIES: I have personally reviewed the radiological images as listed and agreed with the findings in the report. No results found.     ASSESSMENT & PLAN:   Iron deficiency anemia Check cbc cmp iron tibc ferritin.  Labs are reviewed, consistent with iron deficiency anemia.  Recommend IV Venofer weekly x 2   Hodgkin's disease in remission ([place]) Clinically she has no constitutional symptoms.    History of gastric bypass Will check B12 and folate level.    Orders Placed This Encounter  Procedures   Ferritin    Standing Status:   Future    Number of Occurrences:   1    Standing Expiration Date:   09/11/2022   CBC with Differential/Platelet    Standing Status:   Future    Number of Occurrences:   1    Standing Expiration Date:   03/13/2023   Retic Panel    Standing Status:   Future    Number of Occurrences:   1    Standing Expiration Date:   03/13/2023   Iron and TIBC    Standing Status:   Future    Number of Occurrences:   1    Standing Expiration Date:   03/13/2023   Follow up in 6 months.  All questions were answered. The patient knows to call the clinic with any problems, questions or concerns.  Steele Sizer, MD   Return of visit:  Thank you for this kind referral and the opportunity to participate in the care of this patient. A copy of today's note is routed to referring provider   Earlie Server, MD, PhD [facility] Hematology Oncology 03/12/2022

## 2022-03-12 NOTE — Assessment & Plan Note (Signed)
Check cbc cmp iron tibc ferritin.  Labs are reviewed, consistent with iron deficiency anemia.  Recommend IV Venofer weekly x 2

## 2022-03-12 NOTE — Assessment & Plan Note (Signed)
Will check B12 and folate level.

## 2022-03-12 NOTE — Assessment & Plan Note (Signed)
Clinically she has no constitutional symptoms.

## 2022-03-13 ENCOUNTER — Telehealth: Payer: Self-pay

## 2022-03-13 NOTE — Telephone Encounter (Signed)
Please schedule and inform pt of appts:   Venofer weekly x2 Labs in 6 months MD+ venofer 1-2 days AFTER labs

## 2022-03-13 NOTE — Telephone Encounter (Signed)
-----   Message from Earlie Server, MD sent at 03/12/2022 11:33 PM EDT ----- Please arrange patient to get IV venofer weekly x2.  Follow up plan. 6 months, lab prior to MD + Venofer zy

## 2022-03-18 ENCOUNTER — Other Ambulatory Visit: Payer: Self-pay

## 2022-03-18 ENCOUNTER — Other Ambulatory Visit: Payer: Self-pay | Admitting: Family Medicine

## 2022-03-18 DIAGNOSIS — F3162 Bipolar disorder, current episode mixed, moderate: Secondary | ICD-10-CM

## 2022-03-18 DIAGNOSIS — F411 Generalized anxiety disorder: Secondary | ICD-10-CM

## 2022-03-18 DIAGNOSIS — E88819 Insulin resistance, unspecified: Secondary | ICD-10-CM

## 2022-03-19 ENCOUNTER — Other Ambulatory Visit: Payer: Self-pay

## 2022-03-19 ENCOUNTER — Inpatient Hospital Stay: Payer: BC Managed Care – PPO

## 2022-03-19 ENCOUNTER — Other Ambulatory Visit: Payer: Self-pay | Admitting: Family Medicine

## 2022-03-19 VITALS — BP 109/80 | HR 90 | Temp 98.7°F | Resp 16

## 2022-03-19 DIAGNOSIS — D509 Iron deficiency anemia, unspecified: Secondary | ICD-10-CM | POA: Diagnosis not present

## 2022-03-19 DIAGNOSIS — F3162 Bipolar disorder, current episode mixed, moderate: Secondary | ICD-10-CM

## 2022-03-19 DIAGNOSIS — F411 Generalized anxiety disorder: Secondary | ICD-10-CM

## 2022-03-19 DIAGNOSIS — K219 Gastro-esophageal reflux disease without esophagitis: Secondary | ICD-10-CM

## 2022-03-19 MED ORDER — SODIUM CHLORIDE 0.9 % IV SOLN
INTRAVENOUS | Status: DC
  Filled 2022-03-19: qty 250

## 2022-03-19 MED ORDER — SODIUM CHLORIDE 0.9 % IV SOLN
200.0000 mg | INTRAVENOUS | Status: DC
  Administered 2022-03-19: 200 mg via INTRAVENOUS
  Filled 2022-03-19: qty 200

## 2022-03-19 NOTE — Patient Instructions (Signed)

## 2022-03-20 ENCOUNTER — Other Ambulatory Visit: Payer: Self-pay

## 2022-03-20 ENCOUNTER — Other Ambulatory Visit: Payer: Self-pay | Admitting: Family Medicine

## 2022-03-20 DIAGNOSIS — F411 Generalized anxiety disorder: Secondary | ICD-10-CM

## 2022-03-20 DIAGNOSIS — E88819 Insulin resistance, unspecified: Secondary | ICD-10-CM

## 2022-03-20 DIAGNOSIS — K219 Gastro-esophageal reflux disease without esophagitis: Secondary | ICD-10-CM

## 2022-03-20 MED FILL — Hydroxyzine HCl Tab 10 MG: ORAL | 10 days supply | Qty: 30 | Fill #0 | Status: AC

## 2022-03-20 MED FILL — Omeprazole Cap Delayed Release 40 MG: ORAL | 30 days supply | Qty: 30 | Fill #0 | Status: AC

## 2022-03-20 MED FILL — Semaglutide Soln Pen-inj 1 MG/DOSE (4 MG/3ML): SUBCUTANEOUS | 28 days supply | Qty: 3 | Fill #0 | Status: AC

## 2022-03-26 ENCOUNTER — Inpatient Hospital Stay: Payer: BC Managed Care – PPO

## 2022-03-26 VITALS — BP 123/84 | HR 92 | Temp 98.2°F | Resp 16

## 2022-03-26 DIAGNOSIS — D509 Iron deficiency anemia, unspecified: Secondary | ICD-10-CM

## 2022-03-26 MED ORDER — SODIUM CHLORIDE 0.9 % IV SOLN
200.0000 mg | Freq: Once | INTRAVENOUS | Status: AC
  Administered 2022-03-26: 200 mg via INTRAVENOUS
  Filled 2022-03-26: qty 10

## 2022-03-26 MED ORDER — SODIUM CHLORIDE 0.9 % IV SOLN
Freq: Once | INTRAVENOUS | Status: AC
  Filled 2022-03-26: qty 250

## 2022-04-02 ENCOUNTER — Other Ambulatory Visit: Payer: Self-pay

## 2022-04-02 MED ORDER — PROPRANOLOL HCL 10 MG PO TABS
ORAL_TABLET | ORAL | 0 refills | Status: DC
  Filled 2022-04-02: qty 60, 30d supply, fill #0

## 2022-04-02 MED ORDER — QUETIAPINE FUMARATE 100 MG PO TABS
200.0000 mg | ORAL_TABLET | Freq: Every evening | ORAL | 0 refills | Status: DC
  Filled 2022-04-02: qty 180, 90d supply, fill #0

## 2022-04-09 ENCOUNTER — Encounter: Payer: Self-pay | Admitting: Oncology

## 2022-04-09 ENCOUNTER — Other Ambulatory Visit: Payer: Self-pay

## 2022-04-09 MED ORDER — LISDEXAMFETAMINE DIMESYLATE 30 MG PO CAPS
ORAL_CAPSULE | ORAL | 0 refills | Status: DC
  Filled 2022-04-09 (×2): qty 30, 30d supply, fill #0

## 2022-04-10 ENCOUNTER — Other Ambulatory Visit: Payer: Self-pay

## 2022-04-23 ENCOUNTER — Other Ambulatory Visit: Payer: Self-pay

## 2022-04-23 ENCOUNTER — Other Ambulatory Visit: Payer: Self-pay | Admitting: Family Medicine

## 2022-04-23 DIAGNOSIS — F411 Generalized anxiety disorder: Secondary | ICD-10-CM

## 2022-04-23 DIAGNOSIS — K219 Gastro-esophageal reflux disease without esophagitis: Secondary | ICD-10-CM

## 2022-04-23 DIAGNOSIS — E88819 Insulin resistance, unspecified: Secondary | ICD-10-CM

## 2022-04-23 MED ORDER — OMEPRAZOLE 40 MG PO CPDR
DELAYED_RELEASE_CAPSULE | ORAL | 0 refills | Status: DC
  Filled 2022-04-23 – 2022-06-15 (×3): qty 30, 30d supply, fill #0

## 2022-04-23 MED ORDER — OZEMPIC (1 MG/DOSE) 4 MG/3ML ~~LOC~~ SOPN
1.0000 mg | PEN_INJECTOR | SUBCUTANEOUS | 0 refills | Status: DC
  Filled 2022-04-23: qty 3, 28d supply, fill #0

## 2022-04-23 MED ORDER — HYDROXYZINE HCL 10 MG PO TABS
10.0000 mg | ORAL_TABLET | Freq: Three times a day (TID) | ORAL | 0 refills | Status: DC | PRN
  Filled 2022-04-23: qty 30, 10d supply, fill #0

## 2022-04-24 ENCOUNTER — Other Ambulatory Visit: Payer: Self-pay

## 2022-04-25 ENCOUNTER — Other Ambulatory Visit: Payer: Self-pay

## 2022-04-26 ENCOUNTER — Other Ambulatory Visit: Payer: Self-pay

## 2022-04-29 ENCOUNTER — Other Ambulatory Visit: Payer: Self-pay

## 2022-04-30 ENCOUNTER — Other Ambulatory Visit: Payer: Self-pay

## 2022-05-01 ENCOUNTER — Other Ambulatory Visit: Payer: Self-pay

## 2022-05-04 ENCOUNTER — Other Ambulatory Visit: Payer: Self-pay

## 2022-05-06 DIAGNOSIS — R87610 Atypical squamous cells of undetermined significance on cytologic smear of cervix (ASC-US): Secondary | ICD-10-CM | POA: Insufficient documentation

## 2022-05-06 DIAGNOSIS — D126 Benign neoplasm of colon, unspecified: Secondary | ICD-10-CM | POA: Insufficient documentation

## 2022-05-06 NOTE — Progress Notes (Deleted)
PCP: Steele Sizer, MD   No chief complaint on file.   HPI:      Ms. Jessica [name] is a 56 y.o. [id number] whose LMP was No LMP recorded. Patient has had a hysterectomy., presents today for her annual examination.  Her menses are absent due to hyst She {does:18564} have vasomotor sx.   Sex activity: {sex active: 315163}. She {does:18564} have vaginal dryness.  Last Pap: 11/23/19 Results were: ASCUS with POSITIVE high risk HPV ; 04/08/17 Results were neg cells, POS HPV DNA. repeat pap due Hx of STDs: HPV  Last mammogram: 12/16/19 Results were: normal--routine follow-up in 12 months There is no FH of breast cancer. There is no FH of ovarian cancer. The patient {does:18564} do self-breast exams.  Colonoscopy: 11/20/19 with Dr. Marius Ditch, with polyps; Repeat due after 1 years.   Tobacco use: {tob:20664} Alcohol use: {Alcohol:11675} No drug use Exercise: {exercise:31265}  She {does:18564} get adequate calcium and Vitamin D in her diet.  Labs with PCP.   Patient Active Problem List   Diagnosis Date Noted   Iron deficiency anemia 01/09/2018   Cervical high risk HPV (human papillomavirus) test positive 04/08/2017   History of gastric bypass    Ulcer jejunum    Adenoma of left adrenal gland 02/18/2017   Abnormal TSH 08/30/2016   Bipolar disorder ([place]) 12/02/2015   HAV (hallux abducto valgus) 11/22/2015   Bunion of great toe 11/08/2015   Attention-deficit hyperactivity disorder, other type 10/28/2015   Sciatica 04/22/2015   B12 deficiency 12/01/2014   History of bariatric surgery 12/01/2014   Anxiety, generalized 12/01/2014   Gastro-esophageal reflux disease without esophagitis 12/01/2014   H/O elevated lipids 12/01/2014   H/O: HTN (hypertension) 12/01/2014   H/O: obesity 12/01/2014   Hodgkin's disease in remission ([place]) 12/01/2014   Current tobacco use 12/01/2014   Insomnia 03/24/2008   Abnormal serum level of alkaline phosphatase 11/08/2006   Chronic recurrent major  depressive disorder ([place]) 11/08/2006    Past Surgical History:  Procedure Laterality Date   ABDOMINAL HYSTERECTOMY     BUNIONECTOMY Right    CARPAL TUNNEL RELEASE     COLONOSCOPY     multiple   COLONOSCOPY WITH PROPOFOL N/A 11/20/2019   Procedure: COLONOSCOPY WITH PROPOFOL;  Surgeon: Lin Landsman, MD;  Location: [facility];  Service: Gastroenterology;  Laterality: N/A;   ESOPHAGOGASTRODUODENOSCOPY (EGD) WITH PROPOFOL N/A 02/21/2017   Procedure: ESOPHAGOGASTRODUODENOSCOPY (EGD) WITH PROPOFOL;  Surgeon: Lucilla Lame, MD;  Location: [place];  Service: Gastroenterology;  Laterality: N/A;  Pre-diabetic   excision of neck mass  09/1998   GASTRIC BYPASS  03/12/2011   LAPAROSCOPIC SUPRACERVICAL HYSTERECTOMY  2008   and unilateral salpingoophorectomy/ endometriosis-Dr Kincius   TONSILLECTOMY      Family History  Problem Relation Age of Onset   Cancer Mother 26       colon   Epilepsy Mother    Multiple myeloma Mother    Glaucoma Father    Breast cancer Maternal Grandmother 83       inflamitory breast ca    Social History   Socioeconomic History   Marital status: Married    Spouse name: Engineer, materials   Number of children: 2   Years of education: Not on file   Highest education level: Associate degree: occupational, Hotel manager, or vocational program  Occupational History   Occupation: house wife   Tobacco Use   Smoking status: Every Day    Packs/day: 0.50    Years: 16.00    Total  pack years: 8.00    Types: Cigarettes, E-cigarettes    Start date: 06/11/2000    Last attempt to quit: 06/03/2020    Years since quitting: 1.9   Smokeless tobacco: Never   Tobacco comments:    Currently quitting, transitioned from cigarettes to e-cigarettes, started at 7 and is down to 3 mg   Vaping Use   Vaping Use: Some days  Substance and Sexual Activity   Alcohol use: No    Alcohol/week: 0.0 standard drinks of alcohol    Comment: rarely   Drug use: No   Sexual activity: Yes     Partners: Male    Birth control/protection: Surgical  Other Topics Concern   Not on file  Social History Narrative   Not on file   Social Determinants of Health   Financial Resource Strain: Low Risk  (02/28/2022)   Overall Financial Resource Strain (CARDIA)    Difficulty of Paying Living Expenses: Not hard at all  Food Insecurity: No Food Insecurity (02/28/2022)   Hunger Vital Sign    Worried About Running Out of Food in the Last Year: Never true    Ran Out of Food in the Last Year: Never true  Transportation Needs: No Transportation Needs (02/28/2022)   PRAPARE - Hydrologist (Medical): No    Lack of Transportation (Non-Medical): No  Physical Activity: Insufficiently Active (02/28/2022)   Exercise Vital Sign    Days of Exercise per Week: 2 days    Minutes of Exercise per Session: 10 min  Stress: Stress Concern Present (02/28/2022)   [place]    Feeling of Stress : Rather much  Social Connections: Socially Integrated (02/28/2022)   Social Connection and Isolation Panel [NHANES]    Frequency of Communication with Friends and Family: More than three times a week    Frequency of Social Gatherings with Friends and Family: Once a week    Attends Religious Services: More than 4 times per year    Active Member of [employer] or Organizations: Yes    Attends Music therapist: More than 4 times per year    Marital Status: Married  Human resources officer Violence: Not At Risk (02/28/2022)   Humiliation, Afraid, Rape, and Kick questionnaire    Fear of Current or Ex-Partner: No    Emotionally Abused: No    Physically Abused: No    Sexually Abused: No     Current Outpatient Medications:    Ascorbic Acid (VITAMIN C) 1000 MG tablet, Take 1 tablet (1,000 mg total) by mouth daily., Disp: 100 tablet, Rfl: 1   atorvastatin (LIPITOR) 40 MG tablet, TAKE 1 TABLET BY MOUTH DAILY., Disp: 90 tablet, Rfl:  1   Cholecalciferol (VITAMIN D) 50 MCG (2000 UT) CAPS, Take 1 capsule (2,000 Units total) by mouth daily., Disp: 30 capsule, Rfl: 0   cyclobenzaprine (FLEXERIL) 5 MG tablet, TAKE 1 TABLET BY MOUTH AT BEDTIME AS NEEDED FOR MUSCLE SPASM, Disp: 90 tablet, Rfl: 1   DULoxetine (CYMBALTA) 60 MG capsule, TAKE 1 CAPSULE BY MOUTH DAILY., Disp: 90 capsule, Rfl: 1   gabapentin (NEURONTIN) 100 MG capsule, TAKE 2 CAPSULES (200 MG) BY MOUTH AT BEDTIME., Disp: 180 capsule, Rfl: 1   hydrOXYzine (ATARAX) 10 MG tablet, Take 1 tablet (10 mg total) by mouth 3 (three) times daily as needed., Disp: 30 tablet, Rfl: 0   levothyroxine (SYNTHROID) 25 MCG tablet, Take 1 tablet (25 mcg total) by mouth daily before  breakfast., Disp: 90 tablet, Rfl: 1   lisdexamfetamine (VYVANSE) 30 MG capsule, Take 1 capsule by mouth each morning, Disp: 30 capsule, Rfl: 0   Multiple Vitamins-Minerals (MULTI-VITAMIN GUMMIES) CHEW, Chew 2 capsules by mouth daily., Disp: 30 tablet, Rfl: 0   omeprazole (PRILOSEC) 40 MG capsule, TAKE 1 CAPSULE BY MOUTH EVERY DAY, Disp: 30 capsule, Rfl: 0   propranolol (INDERAL) 10 MG tablet, Take 1 tablet by mouth once or twice daily as needed for anxiety, Disp: 60 tablet, Rfl: 0   QUEtiapine (SEROQUEL) 100 MG tablet, Take 2 tablets (200 mg total) by mouth nightly., Disp: 180 tablet, Rfl: 0   Semaglutide, 1 MG/DOSE, (OZEMPIC, 1 MG/DOSE,) 4 MG/3ML SOPN, Inject 1 mg as directed once a week., Disp: 3 mL, Rfl: 0     ROS:  Review of Systems BREAST: No symptoms    Objective: There were no vitals taken for this visit.   OBGyn Exam  Results: No results found for this or any previous visit (from the past 24 hour(s)).  Assessment/Plan:  No diagnosis found.   No orders of the defined types were placed in this encounter.           GYN counsel {counseling: 16159}    F/U  No follow-ups on file.  Wave Calzada B. Rainie Crenshaw, PA-C 05/06/2022 8:29 PM

## 2022-05-07 ENCOUNTER — Encounter: Payer: Self-pay | Admitting: Oncology

## 2022-05-07 ENCOUNTER — Other Ambulatory Visit: Payer: Self-pay | Admitting: Family Medicine

## 2022-05-07 ENCOUNTER — Other Ambulatory Visit: Payer: Self-pay

## 2022-05-07 DIAGNOSIS — E88819 Insulin resistance, unspecified: Secondary | ICD-10-CM

## 2022-05-07 DIAGNOSIS — F411 Generalized anxiety disorder: Secondary | ICD-10-CM

## 2022-05-07 MED ORDER — LISDEXAMFETAMINE DIMESYLATE 40 MG PO CAPS
40.0000 mg | ORAL_CAPSULE | Freq: Every morning | ORAL | 0 refills | Status: DC
  Filled 2022-05-07 – 2022-05-15 (×5): qty 30, 30d supply, fill #0

## 2022-05-07 MED ORDER — QUETIAPINE FUMARATE 300 MG PO TABS
300.0000 mg | ORAL_TABLET | Freq: Every evening | ORAL | 0 refills | Status: DC
  Filled 2022-05-07: qty 30, 30d supply, fill #0

## 2022-05-08 ENCOUNTER — Ambulatory Visit: Payer: BC Managed Care – PPO | Admitting: Obstetrics and Gynecology

## 2022-05-08 ENCOUNTER — Other Ambulatory Visit: Payer: Self-pay

## 2022-05-08 DIAGNOSIS — Z1211 Encounter for screening for malignant neoplasm of colon: Secondary | ICD-10-CM

## 2022-05-08 DIAGNOSIS — Z1151 Encounter for screening for human papillomavirus (HPV): Secondary | ICD-10-CM

## 2022-05-08 DIAGNOSIS — D126 Benign neoplasm of colon, unspecified: Secondary | ICD-10-CM

## 2022-05-08 DIAGNOSIS — Z1231 Encounter for screening mammogram for malignant neoplasm of breast: Secondary | ICD-10-CM

## 2022-05-08 DIAGNOSIS — R87610 Atypical squamous cells of undetermined significance on cytologic smear of cervix (ASC-US): Secondary | ICD-10-CM

## 2022-05-08 DIAGNOSIS — Z01419 Encounter for gynecological examination (general) (routine) without abnormal findings: Secondary | ICD-10-CM

## 2022-05-08 DIAGNOSIS — Z124 Encounter for screening for malignant neoplasm of cervix: Secondary | ICD-10-CM

## 2022-05-09 ENCOUNTER — Other Ambulatory Visit: Payer: Self-pay | Admitting: Family Medicine

## 2022-05-09 DIAGNOSIS — I700 Atherosclerosis of aorta: Secondary | ICD-10-CM

## 2022-05-09 DIAGNOSIS — F3162 Bipolar disorder, current episode mixed, moderate: Secondary | ICD-10-CM

## 2022-05-09 DIAGNOSIS — F411 Generalized anxiety disorder: Secondary | ICD-10-CM

## 2022-05-09 DIAGNOSIS — M545 Low back pain, unspecified: Secondary | ICD-10-CM

## 2022-05-09 DIAGNOSIS — E88819 Insulin resistance, unspecified: Secondary | ICD-10-CM

## 2022-05-09 NOTE — Progress Notes (Unsigned)
Name: Jessica [name]   MRN: [id number]    DOB: Dec 14, 1965   Date:05/10/2022       Progress Note  Subjective  Chief Complaint  Follow Up  HPI  Pre-diabetes:hgbA1C was back in 2013 was 6.3%, but down to 5.3 % with weight loss , she was gradually gaining weight after bariatric surgery ( 2012 )  and we started her on Metformin 08/2016 , followed by Trulicity but we stopped medication when she lost a lot of weight in 2021, gradually started to gain weight again and we started her back on Ozempic July 2023, she states Ozempic is not curbing her appetite lost more weight since started on Vyvanse for new diagnosis of ADHD by [facility].    B12 deficiency: she is taking SL B12 last level was 247. We will give her IM today   Bipolar: no longer seeing Dr.Eappen , had a gap due to cost but since last visit 06/23 we advised her to go back due to increase in depression triggered by her mother's death and compound grief, however she has been dismissed from Dr. Charlcie Cradle practice from lack of follow up. We placed a new referral for [facility] and appointment and she is currently seeing Ander Slade  PA and seroquel dose was increased to 300 mg at night, still taking Duloxetine 60 mg and now on Vyvanse 40 mg for diagnoses of ADD. She has lack of impulse control and has been fired from 4 jobs in the past 5 years. 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   Adult hypothyroidism: : she has been on medication since 2020, last TSH was at goal at 1.96 . She has fatigue but likely multifactorial. No change in bowel movements . Continue current dose    Hodgkin's lymphoma:she went back to see Dr. Tasia Catchings in 2019 , last CBC was normal, iron storage was low she as seen by hematologist and had two iron infusions and is going back in April for repeat levels    Atherosclerosis of aorta: she is on Atorvastatin and denies side effects, last LDL at goal , last LDL was 61  , continue medication  Vitamin D deficiency: she is taking supplementation.   Patient Active Problem List   Diagnosis Date Noted   ASCUS with positive high risk HPV cervical 05/06/2022   Tubular adenoma of colon 05/06/2022   Iron deficiency anemia 01/09/2018   Cervical high risk HPV (human papillomavirus) test positive 04/08/2017   History of gastric bypass    Ulcer jejunum    Adenoma of left adrenal gland 02/18/2017   Abnormal TSH 08/30/2016   Bipolar disorder ([place]) 12/02/2015   HAV (hallux abducto valgus) 11/22/2015   Bunion of great toe 11/08/2015   Attention-deficit hyperactivity disorder, other type 10/28/2015   Sciatica 04/22/2015   B12 deficiency 12/01/2014   History of bariatric surgery 12/01/2014   Anxiety, generalized 12/01/2014   Gastro-esophageal reflux disease without esophagitis 12/01/2014   H/O elevated lipids 12/01/2014   H/O: HTN (hypertension) 12/01/2014   H/O: obesity 12/01/2014   Hodgkin's disease in remission ([place]) 12/01/2014   Current tobacco use 12/01/2014   Insomnia 03/24/2008   Abnormal serum level of alkaline phosphatase 11/08/2006   Chronic recurrent major depressive disorder ([place]) 11/08/2006    Past Surgical History:  Procedure Laterality Date   ABDOMINAL HYSTERECTOMY     BUNIONECTOMY Right    CARPAL TUNNEL RELEASE     COLONOSCOPY  multiple   COLONOSCOPY WITH PROPOFOL N/A 11/20/2019   Procedure: COLONOSCOPY WITH PROPOFOL;  Surgeon: Lin Landsman, MD;  Location: [facility];  Service: Gastroenterology;  Laterality: N/A;   ESOPHAGOGASTRODUODENOSCOPY (EGD) WITH PROPOFOL N/A 02/21/2017   Procedure: ESOPHAGOGASTRODUODENOSCOPY (EGD) WITH PROPOFOL;  Surgeon: Lucilla Lame, MD;  Location: [facility];  Service: Gastroenterology;  Laterality: N/A;  Pre-diabetic   excision of neck mass  09/1998   GASTRIC BYPASS  03/12/2011   LAPAROSCOPIC SUPRACERVICAL HYSTERECTOMY  2008   and unilateral salpingoophorectomy/ endometriosis-Dr Kincius    TONSILLECTOMY      Family History  Problem Relation Age of Onset   Cancer Mother 87       colon   Epilepsy Mother    Multiple myeloma Mother    Glaucoma Father    Breast cancer Maternal Grandmother 83       inflamitory breast ca    Social History   Tobacco Use   Smoking status: Every Day    Packs/day: 0.50    Years: 16.00    Total pack years: 8.00    Types: Cigarettes, E-cigarettes    Start date: 06/11/2000    Last attempt to quit: 06/03/2020    Years since quitting: 1.9   Smokeless tobacco: Never   Tobacco comments:    Only on e-cigarettes   Substance Use Topics   Alcohol use: No    Alcohol/week: 0.0 standard drinks of alcohol    Comment: rarely     Current Outpatient Medications:    cyclobenzaprine (FLEXERIL) 5 MG tablet, TAKE 1 TABLET BY MOUTH AT BEDTIME AS NEEDED FOR MUSCLE SPASM, Disp: 90 tablet, Rfl: 1   DULoxetine (CYMBALTA) 60 MG capsule, Take 60 mg by mouth daily., Disp: , Rfl:    lisdexamfetamine (VYVANSE) 40 MG capsule, Take 40 mg by mouth every morning., Disp: , Rfl:    omeprazole (PRILOSEC) 40 MG capsule, TAKE 1 CAPSULE BY MOUTH EVERY DAY, Disp: 30 capsule, Rfl: 0   QUEtiapine (SEROQUEL) 300 MG tablet, Take 300 mg by mouth at bedtime., Disp: , Rfl:    Ascorbic Acid (VITAMIN C) 1000 MG tablet, Take 1 tablet (1,000 mg total) by mouth daily., Disp: 100 tablet, Rfl: 1   atorvastatin (LIPITOR) 40 MG tablet, Take 1 tablet (40 mg total) by mouth daily., Disp: 90 tablet, Rfl: 1   Cholecalciferol (VITAMIN D) 50 MCG (2000 UT) CAPS, Take 1 capsule (2,000 Units total) by mouth daily., Disp: 30 capsule, Rfl: 0   gabapentin (NEURONTIN) 100 MG capsule, TAKE 2 CAPSULES (200 MG) BY MOUTH AT BEDTIME., Disp: 180 capsule, Rfl: 1   levothyroxine (SYNTHROID) 25 MCG tablet, Take 1 tablet (25 mcg total) by mouth daily before breakfast., Disp: 90 tablet, Rfl: 1   Multiple Vitamins-Minerals (MULTI-VITAMIN GUMMIES) CHEW, Chew 2 capsules by mouth daily., Disp: 30 tablet, Rfl: 0    Semaglutide, 1 MG/DOSE, (OZEMPIC, 1 MG/DOSE,) 4 MG/3ML SOPN, Inject 1 mg as directed once a week., Disp: 9 mL, Rfl: 1  Current Facility-Administered Medications:    cyanocobalamin (VITAMIN B12) injection 1,000 mcg, 1,000 mcg, Intramuscular, Once, Steele Sizer, MD  Allergies  Allergen Reactions   Vicodin [Hydrocodone-Acetaminophen] Other (See Comments)    Has really bad headaches/ migraine      I personally reviewed active problem list, medication list, allergies, family history, social history, health maintenance with the patient/caregiver today.   ROS  Ten systems reviewed and is negative except as mentioned in HPI   Objective  Vitals:   05/10/22 1029  BP: 102/64  Pulse: 94  Resp: 16  Temp: 98.6 F (37 C)  TempSrc: Oral  SpO2: 99%  Weight: 144 lb 6.4 oz (65.5 kg)  Height: _0  (1.651 m)    Body mass index is 24.03 kg/m.  Physical Exam  Constitutional: Patient appears well-developed and well-nourished.  No distress.  HEENT: head atraumatic, normocephalic, pupils equal and reactive to light, neck supple Cardiovascular: Normal rate, regular rhythm and normal heart sounds.  No murmur heard. No BLE edema. Pulmonary/Chest: Effort normal and breath sounds normal. No respiratory distress. Abdominal: Soft.  There is no tenderness. Psychiatric: Patient has a normal mood and affect. behavior is normal. Judgment and thought content normal.   Recent Results (from the past 2160 hour(s))  Iron and TIBC     Status: None   Collection Time: 03/12/22  4:03 PM  Result Value Ref Range   Iron 52 28 - 170 ug/dL   TIBC 384 250 - 450 ug/dL   Saturation Ratios 14 10.4 - 31.8 %   UIBC 332 ug/dL    Comment: Performed at [facility], [address] [place], [place] [address]  Retic Panel     Status: None   Collection Time: 03/12/22  4:03 PM  Result Value Ref Range   Retic Ct Pct 1.0 0.4 - 3.1 %   RBC. 4.45 3.87 - 5.11 MIL/uL   Retic Count, Absolute 45.8 19.0 - 186.0  K/uL   Immature Retic Fract 7.3 2.3 - 15.9 %   Reticulocyte Hemoglobin 33.2 >27.9 pg    Comment:        Given the high negative predictive value of a RET-He result > 32 pg iron deficiency is essentially excluded. If this patient is anemic other etiologies should be considered. Performed at [facility], [address] [place], [place] [address]   CBC with Differential/Platelet     Status: None   Collection Time: 03/12/22  4:03 PM  Result Value Ref Range   WBC 7.0 4.0 - 10.5 K/uL   RBC 4.48 3.87 - 5.11 MIL/uL   Hemoglobin 12.3 12.0 - 15.0 g/dL   HCT 38.8 36.0 - 46.0 %   MCV 86.6 80.0 - 100.0 fL   MCH 27.5 26.0 - 34.0 pg   MCHC 31.7 30.0 - 36.0 g/dL   RDW 15.0 11.5 - 15.5 %   Platelets 284 150 - 400 K/uL   nRBC 0.0 0.0 - 0.2 %   Neutrophils Relative % 45 %   Neutro Abs 3.1 1.7 - 7.7 K/uL   Lymphocytes Relative 44 %   Lymphs Abs 3.2 0.7 - 4.0 K/uL   Monocytes Relative 6 %   Monocytes Absolute 0.4 0.1 - 1.0 K/uL   Eosinophils Relative 3 %   Eosinophils Absolute 0.2 0.0 - 0.5 K/uL   Basophils Relative 1 %   Basophils Absolute 0.0 0.0 - 0.1 K/uL   Immature Granulocytes 1 %   Abs Immature Granulocytes 0.05 0.00 - 0.07 K/uL    Comment: Performed at [facility], [address] [place], [place] [address]  Ferritin     Status: Abnormal   Collection Time: 03/12/22  4:03 PM  Result Value Ref Range   Ferritin 6 (L) 11 - 307 ng/mL    Comment: Performed at [facility], [address] [place], [place] [address]    PHQ2/9:    05/10/2022   10:28 AM 02/28/2022    8:21 AM 01/09/2022    1:10 PM 12/08/2021    2:46 PM  07/12/2021    1:44 PM  Depression screen PHQ 2/9  Decreased Interest 1 0 _0 Down, Depressed, Hopeless _1 PHQ - 2 Score _2 Altered sleeping 1 3 0 3 1  Tired, decreased energy 1 3 0 3 1  Change in appetite 1 0 0 0 0  Feeling bad or failure about yourself  0 0 0 0 0  Trouble concentrating 1 0 0 0 0  Moving slowly or  fidgety/restless 0 0 0 0 0  Suicidal thoughts 0 0 0 0 0  PHQ-9 Score _3 Difficult doing work/chores Somewhat difficult        phq 9 is positive   Fall Risk:    05/10/2022   10:28 AM 03/05/2022    3:40 PM 02/28/2022    8:14 AM 01/09/2022    1:07 PM 12/08/2021    2:46 PM  Fall Risk   Falls in the past year? 0 0 0 0 0  Number falls in past yr: 0 0 0 0 0  Injury with Fall? 0 0 0 0 0  Risk for fall due to : _4   Follow up Falls prevention discussed;Education provided;Falls evaluation completed Falls prevention discussed Falls prevention discussed Falls prevention discussed Falls prevention discussed    Assessment & Plan  1. Atherosclerosis of abdominal aorta (HCC)  - atorvastatin (LIPITOR) 40 MG tablet; Take 1 tablet (40 mg total) by mouth daily.  Dispense: 90 tablet; Refill: 1  2. GAD (generalized anxiety disorder)  - gabapentin (NEURONTIN) 100 MG capsule; TAKE 2 CAPSULES (200 MG) BY MOUTH AT BEDTIME.  Dispense: 180 capsule; Refill: 1  3. Bipolar 1 disorder, mixed, moderate (HCC)  - gabapentin (NEURONTIN) 100 MG capsule; TAKE 2 CAPSULES (200 MG) BY MOUTH AT BEDTIME.  Dispense: 180 capsule; Refill: 1  4. Insulin resistance  - Semaglutide, 1 MG/DOSE, (OZEMPIC, 1 MG/DOSE,) 4 MG/3ML SOPN; Inject 1 mg as directed once a week.  Dispense: 9 mL; Refill: 1  5. History of bariatric surgery   6. Adult hypothyroidism  - levothyroxine (SYNTHROID) 25 MCG tablet; Take 1 tablet (25 mcg total) by mouth daily before breakfast.  Dispense: 90 tablet; Refill: 1  7. Gastro-esophageal reflux disease without esophagitis   8. B12 deficiency  - cyanocobalamin (VITAMIN B12) injection 1,000 mcg

## 2022-05-10 ENCOUNTER — Ambulatory Visit (INDEPENDENT_AMBULATORY_CARE_PROVIDER_SITE_OTHER): Payer: BC Managed Care – PPO | Admitting: Family Medicine

## 2022-05-10 ENCOUNTER — Encounter: Payer: Self-pay | Admitting: Family Medicine

## 2022-05-10 ENCOUNTER — Other Ambulatory Visit: Payer: Self-pay

## 2022-05-10 VITALS — BP 102/64 | HR 94 | Temp 98.6°F | Resp 16 | Ht 65.0 in | Wt 144.4 lb

## 2022-05-10 DIAGNOSIS — I700 Atherosclerosis of aorta: Secondary | ICD-10-CM | POA: Diagnosis not present

## 2022-05-10 DIAGNOSIS — K219 Gastro-esophageal reflux disease without esophagitis: Secondary | ICD-10-CM

## 2022-05-10 DIAGNOSIS — E039 Hypothyroidism, unspecified: Secondary | ICD-10-CM

## 2022-05-10 DIAGNOSIS — F411 Generalized anxiety disorder: Secondary | ICD-10-CM

## 2022-05-10 DIAGNOSIS — Z9884 Bariatric surgery status: Secondary | ICD-10-CM

## 2022-05-10 DIAGNOSIS — E88819 Insulin resistance, unspecified: Secondary | ICD-10-CM

## 2022-05-10 DIAGNOSIS — F3162 Bipolar disorder, current episode mixed, moderate: Secondary | ICD-10-CM

## 2022-05-10 DIAGNOSIS — E538 Deficiency of other specified B group vitamins: Secondary | ICD-10-CM

## 2022-05-10 MED ORDER — LEVOTHYROXINE SODIUM 25 MCG PO TABS
25.0000 ug | ORAL_TABLET | Freq: Every day | ORAL | 1 refills | Status: DC
  Filled 2022-05-10 – 2022-05-14 (×2): qty 90, 90d supply, fill #0

## 2022-05-10 MED ORDER — ATORVASTATIN CALCIUM 40 MG PO TABS
40.0000 mg | ORAL_TABLET | Freq: Every day | ORAL | 1 refills | Status: DC
  Filled 2022-05-10 – 2022-05-14 (×2): qty 90, 90d supply, fill #0

## 2022-05-10 MED ORDER — CYANOCOBALAMIN 1000 MCG/ML IJ SOLN
1000.0000 ug | Freq: Once | INTRAMUSCULAR | Status: AC
  Administered 2022-05-10: 1000 ug via INTRAMUSCULAR

## 2022-05-10 MED ORDER — GABAPENTIN 100 MG PO CAPS
ORAL_CAPSULE | ORAL | 1 refills | Status: DC
  Filled 2022-05-10: qty 180, fill #0
  Filled 2022-05-14: qty 180, 90d supply, fill #0
  Filled 2022-06-15 – 2022-08-11 (×2): qty 180, 90d supply, fill #1

## 2022-05-10 MED ORDER — OZEMPIC (1 MG/DOSE) 4 MG/3ML ~~LOC~~ SOPN
1.0000 mg | PEN_INJECTOR | SUBCUTANEOUS | 1 refills | Status: DC
  Filled 2022-05-10: qty 9, 84d supply, fill #0
  Filled 2022-05-14 – 2022-05-15 (×3): qty 3, 28d supply, fill #0

## 2022-05-14 ENCOUNTER — Other Ambulatory Visit: Payer: Self-pay | Admitting: Family Medicine

## 2022-05-14 ENCOUNTER — Encounter: Payer: Self-pay | Admitting: Oncology

## 2022-05-14 ENCOUNTER — Other Ambulatory Visit: Payer: Self-pay

## 2022-05-14 DIAGNOSIS — M545 Low back pain, unspecified: Secondary | ICD-10-CM

## 2022-05-15 ENCOUNTER — Other Ambulatory Visit: Payer: Self-pay

## 2022-05-15 ENCOUNTER — Encounter: Payer: Self-pay | Admitting: Family Medicine

## 2022-05-15 DIAGNOSIS — E88819 Insulin resistance, unspecified: Secondary | ICD-10-CM

## 2022-05-15 DIAGNOSIS — I700 Atherosclerosis of aorta: Secondary | ICD-10-CM

## 2022-05-15 DIAGNOSIS — M545 Low back pain, unspecified: Secondary | ICD-10-CM

## 2022-05-15 DIAGNOSIS — E039 Hypothyroidism, unspecified: Secondary | ICD-10-CM

## 2022-05-15 MED ORDER — OZEMPIC (1 MG/DOSE) 4 MG/3ML ~~LOC~~ SOPN
1.0000 mg | PEN_INJECTOR | SUBCUTANEOUS | 1 refills | Status: DC
  Filled 2022-05-15: qty 9, 84d supply, fill #0
  Filled 2022-06-15: qty 3, 28d supply, fill #0
  Filled 2022-06-15: qty 9, 84d supply, fill #0
  Filled 2022-06-19 – 2022-06-28 (×2): qty 3, 28d supply, fill #0
  Filled 2022-07-02: qty 9, 84d supply, fill #0
  Filled 2022-08-11: qty 3, 28d supply, fill #0
  Filled 2022-08-14 – 2022-08-16 (×2): qty 9, 84d supply, fill #0
  Filled 2022-08-16: qty 3, 28d supply, fill #0
  Filled 2022-08-30: qty 9, 84d supply, fill #0
  Filled 2022-10-17: qty 3, 28d supply, fill #0

## 2022-05-15 MED ORDER — ATORVASTATIN CALCIUM 40 MG PO TABS
40.0000 mg | ORAL_TABLET | Freq: Every day | ORAL | 1 refills | Status: DC
  Filled 2022-05-15 – 2022-08-11 (×3): qty 90, 90d supply, fill #0

## 2022-05-15 MED ORDER — LEVOTHYROXINE SODIUM 25 MCG PO TABS
25.0000 ug | ORAL_TABLET | Freq: Every day | ORAL | 1 refills | Status: DC
  Filled 2022-05-15 – 2022-08-11 (×3): qty 90, 90d supply, fill #0
  Filled 2022-11-28: qty 90, 90d supply, fill #1

## 2022-05-16 ENCOUNTER — Other Ambulatory Visit: Payer: Self-pay

## 2022-05-27 NOTE — Progress Notes (Deleted)
PCP: Steele Sizer, MD   No chief complaint on file.   HPI:      Ms. Jessica [name] is a 56 y.o. [id number] whose LMP was No LMP recorded. Patient has had a hysterectomy., presents today for her annual examination.  Her menses are absent due to hyst She {does:18564} have vasomotor sx.   Sex activity: {sex active: 315163}. She {does:18564} have vaginal dryness.  Last Pap: 11/23/19 Results were: ASCUS with POSITIVE high risk HPV ; 04/08/17 Results were neg cells, POS HPV DNA. repeat pap due Hx of STDs: HPV  Last mammogram: 12/16/19 Results were: normal--routine follow-up in 12 months There is no FH of breast cancer. There is no FH of ovarian cancer. The patient {does:18564} do self-breast exams.  Colonoscopy: 11/20/19 with Dr. Marius Ditch, with polyps; Repeat due after 1 years.   Tobacco use: {tob:20664} Alcohol use: {Alcohol:11675} No drug use Exercise: {exercise:31265}  She {does:18564} get adequate calcium and Vitamin D in her diet.  Labs with PCP.   Patient Active Problem List   Diagnosis Date Noted   ASCUS with positive high risk HPV cervical 05/06/2022   Tubular adenoma of colon 05/06/2022   Iron deficiency anemia 01/09/2018   Cervical high risk HPV (human papillomavirus) test positive 04/08/2017   History of gastric bypass    Ulcer jejunum    Adenoma of left adrenal gland 02/18/2017   Abnormal TSH 08/30/2016   Bipolar disorder ([employer]) 12/02/2015   HAV (hallux abducto valgus) 11/22/2015   Bunion of great toe 11/08/2015   Attention-deficit hyperactivity disorder, other type 10/28/2015   Sciatica 04/22/2015   B12 deficiency 12/01/2014   History of bariatric surgery 12/01/2014   Anxiety, generalized 12/01/2014   Gastro-esophageal reflux disease without esophagitis 12/01/2014   H/O elevated lipids 12/01/2014   H/O: HTN (hypertension) 12/01/2014   H/O: obesity 12/01/2014   Hodgkin's disease in remission ([place]) 12/01/2014   Current tobacco use 12/01/2014   Insomnia  03/24/2008   Abnormal serum level of alkaline phosphatase 11/08/2006   Chronic recurrent major depressive disorder ([place]) 11/08/2006    Past Surgical History:  Procedure Laterality Date   ABDOMINAL HYSTERECTOMY     BUNIONECTOMY Right    CARPAL TUNNEL RELEASE     COLONOSCOPY     multiple   COLONOSCOPY WITH PROPOFOL N/A 11/20/2019   Procedure: COLONOSCOPY WITH PROPOFOL;  Surgeon: Lin Landsman, MD;  Location: [facility] ENDOSCOPY;  Service: Gastroenterology;  Laterality: N/A;   ESOPHAGOGASTRODUODENOSCOPY (EGD) WITH PROPOFOL N/A 02/21/2017   Procedure: ESOPHAGOGASTRODUODENOSCOPY (EGD) WITH PROPOFOL;  Surgeon: Lucilla Lame, MD;  Location: [facility];  Service: Gastroenterology;  Laterality: N/A;  Pre-diabetic   excision of neck mass  09/1998   GASTRIC BYPASS  03/12/2011   LAPAROSCOPIC SUPRACERVICAL HYSTERECTOMY  2008   and unilateral salpingoophorectomy/ endometriosis-Dr Kincius   TONSILLECTOMY      Family History  Problem Relation Age of Onset   Cancer Mother 40       colon   Epilepsy Mother    Multiple myeloma Mother    Glaucoma Father    Breast cancer Maternal Grandmother 83       inflamitory breast ca    Social History   Socioeconomic History   Marital status: Married    Spouse name: Engineer, materials   Number of children: 2   Years of education: Not on file   Highest education level: Associate degree: occupational, Hotel manager, or vocational program  Occupational History   Occupation: house wife   Tobacco Use   Smoking  status: Every Day    Packs/day: 0.50    Years: 16.00    Total pack years: 8.00    Types: Cigarettes, E-cigarettes    Start date: 06/11/2000    Last attempt to quit: 06/03/2020    Years since quitting: 1.9   Smokeless tobacco: Never   Tobacco comments:    Only on e-cigarettes   Vaping Use   Vaping Use: Some days  Substance and Sexual Activity   Alcohol use: No    Alcohol/week: 0.0 standard drinks of alcohol    Comment: rarely   Drug use: No   Sexual  activity: Yes    Partners: Male    Birth control/protection: Surgical  Other Topics Concern   Not on file  Social History Narrative   Not on file   Social Determinants of Health   Financial Resource Strain: Low Risk  (02/28/2022)   Overall Financial Resource Strain (CARDIA)    Difficulty of Paying Living Expenses: Not hard at all  Food Insecurity: No Food Insecurity (02/28/2022)   Hunger Vital Sign    Worried About Running Out of Food in the Last Year: Never true    Ran Out of Food in the Last Year: Never true  Transportation Needs: No Transportation Needs (02/28/2022)   PRAPARE - Hydrologist (Medical): No    Lack of Transportation (Non-Medical): No  Physical Activity: Insufficiently Active (02/28/2022)   Exercise Vital Sign    Days of Exercise per Week: 2 days    Minutes of Exercise per Session: 10 min  Stress: Stress Concern Present (02/28/2022)   [place]    Feeling of Stress : Rather much  Social Connections: Socially Integrated (02/28/2022)   Social Connection and Isolation Panel [NHANES]    Frequency of Communication with Friends and Family: More than three times a week    Frequency of Social Gatherings with Friends and Family: Once a week    Attends Religious Services: More than 4 times per year    Active Member of [employer] or Organizations: Yes    Attends Music therapist: More than 4 times per year    Marital Status: Married  Human resources officer Violence: Not At Risk (02/28/2022)   Humiliation, Afraid, Rape, and Kick questionnaire    Fear of Current or Ex-Partner: No    Emotionally Abused: No    Physically Abused: No    Sexually Abused: No     Current Outpatient Medications:    Ascorbic Acid (VITAMIN C) 1000 MG tablet, Take 1 tablet (1,000 mg total) by mouth daily., Disp: 100 tablet, Rfl: 1   atorvastatin (LIPITOR) 40 MG tablet, Take 1 tablet (40 mg total) by mouth  daily., Disp: 90 tablet, Rfl: 1   Cholecalciferol (VITAMIN D) 50 MCG (2000 UT) CAPS, Take 1 capsule (2,000 Units total) by mouth daily., Disp: 30 capsule, Rfl: 0   cyclobenzaprine (FLEXERIL) 5 MG tablet, TAKE 1 TABLET BY MOUTH AT BEDTIME AS NEEDED FOR MUSCLE SPASM, Disp: 90 tablet, Rfl: 1   DULoxetine (CYMBALTA) 60 MG capsule, Take 60 mg by mouth daily., Disp: , Rfl:    gabapentin (NEURONTIN) 100 MG capsule, TAKE 2 CAPSULES (200 MG) BY MOUTH AT BEDTIME., Disp: 180 capsule, Rfl: 1   levothyroxine (SYNTHROID) 25 MCG tablet, Take 1 tablet (25 mcg total) by mouth daily before breakfast., Disp: 90 tablet, Rfl: 1   lisdexamfetamine (VYVANSE) 40 MG capsule, Take 1 capsule (40 mg total) by  mouth every morning., Disp: 30 capsule, Rfl: 0   lisdexamfetamine (VYVANSE) 40 MG capsule, Take 40 mg by mouth every morning., Disp: , Rfl:    Multiple Vitamins-Minerals (MULTI-VITAMIN GUMMIES) CHEW, Chew 2 capsules by mouth daily., Disp: 30 tablet, Rfl: 0   omeprazole (PRILOSEC) 40 MG capsule, TAKE 1 CAPSULE BY MOUTH EVERY DAY, Disp: 30 capsule, Rfl: 0   QUEtiapine (SEROQUEL) 300 MG tablet, Take 300 mg by mouth at bedtime., Disp: , Rfl:    Semaglutide, 1 MG/DOSE, (OZEMPIC, 1 MG/DOSE,) 4 MG/3ML SOPN, Inject 1 mg as directed once a week., Disp: 9 mL, Rfl: 1     ROS:  Review of Systems BREAST: No symptoms    Objective: There were no vitals taken for this visit.   OBGyn Exam  Results: No results found for this or any previous visit (from the past 24 hour(s)).  Assessment/Plan:  No diagnosis found.   No orders of the defined types were placed in this encounter.           GYN counsel {counseling: 16159}    F/U  No follow-ups on file.  Jabri Blancett B. Ulyana Pitones, PA-C 05/27/2022 7:54 PM

## 2022-05-28 ENCOUNTER — Encounter: Payer: Self-pay | Admitting: Oncology

## 2022-05-28 ENCOUNTER — Ambulatory Visit: Payer: BC Managed Care – PPO | Admitting: Obstetrics and Gynecology

## 2022-05-28 ENCOUNTER — Other Ambulatory Visit: Payer: Self-pay

## 2022-05-28 DIAGNOSIS — Z124 Encounter for screening for malignant neoplasm of cervix: Secondary | ICD-10-CM

## 2022-05-28 DIAGNOSIS — R87610 Atypical squamous cells of undetermined significance on cytologic smear of cervix (ASC-US): Secondary | ICD-10-CM

## 2022-05-28 DIAGNOSIS — Z1151 Encounter for screening for human papillomavirus (HPV): Secondary | ICD-10-CM

## 2022-05-28 DIAGNOSIS — Z1211 Encounter for screening for malignant neoplasm of colon: Secondary | ICD-10-CM

## 2022-05-28 DIAGNOSIS — Z01419 Encounter for gynecological examination (general) (routine) without abnormal findings: Secondary | ICD-10-CM

## 2022-05-28 DIAGNOSIS — Z1231 Encounter for screening mammogram for malignant neoplasm of breast: Secondary | ICD-10-CM

## 2022-05-28 MED ORDER — LISDEXAMFETAMINE DIMESYLATE 40 MG PO CAPS
40.0000 mg | ORAL_CAPSULE | Freq: Every morning | ORAL | 0 refills | Status: DC
  Filled 2022-06-15: qty 30, 30d supply, fill #0

## 2022-05-28 MED ORDER — QUETIAPINE FUMARATE 300 MG PO TABS
300.0000 mg | ORAL_TABLET | Freq: Every day | ORAL | 0 refills | Status: DC
  Filled 2022-05-28 – 2022-06-15 (×2): qty 90, 90d supply, fill #0

## 2022-05-28 MED ORDER — DULOXETINE HCL 60 MG PO CPEP
60.0000 mg | ORAL_CAPSULE | Freq: Every day | ORAL | 0 refills | Status: DC
  Filled 2022-05-28: qty 90, 90d supply, fill #0

## 2022-05-28 MED ORDER — LISDEXAMFETAMINE DIMESYLATE 40 MG PO CAPS
40.0000 mg | ORAL_CAPSULE | Freq: Every morning | ORAL | 0 refills | Status: DC
  Filled 2022-07-02 – 2022-07-25 (×2): qty 30, 30d supply, fill #0

## 2022-05-28 MED ORDER — QUETIAPINE FUMARATE 300 MG PO TABS
300.0000 mg | ORAL_TABLET | Freq: Every evening | ORAL | 0 refills | Status: DC
  Filled 2022-05-28: qty 30, 30d supply, fill #0

## 2022-05-29 ENCOUNTER — Other Ambulatory Visit: Payer: Self-pay

## 2022-06-01 ENCOUNTER — Ambulatory Visit: Payer: BC Managed Care – PPO | Admitting: Family Medicine

## 2022-06-15 ENCOUNTER — Other Ambulatory Visit: Payer: Self-pay | Admitting: Family Medicine

## 2022-06-15 ENCOUNTER — Other Ambulatory Visit: Payer: Self-pay

## 2022-06-15 DIAGNOSIS — M545 Low back pain, unspecified: Secondary | ICD-10-CM

## 2022-06-15 MED FILL — Cyclobenzaprine HCl Tab 5 MG: ORAL | 90 days supply | Qty: 90 | Fill #0 | Status: AC

## 2022-06-18 ENCOUNTER — Other Ambulatory Visit: Payer: Self-pay

## 2022-06-19 ENCOUNTER — Other Ambulatory Visit: Payer: Self-pay | Admitting: Family Medicine

## 2022-06-19 ENCOUNTER — Other Ambulatory Visit: Payer: Self-pay

## 2022-06-20 ENCOUNTER — Other Ambulatory Visit: Payer: Self-pay

## 2022-06-22 ENCOUNTER — Other Ambulatory Visit: Payer: Self-pay

## 2022-06-25 ENCOUNTER — Other Ambulatory Visit: Payer: Self-pay

## 2022-06-28 ENCOUNTER — Other Ambulatory Visit: Payer: Self-pay

## 2022-06-29 ENCOUNTER — Encounter: Payer: Self-pay | Admitting: Family Medicine

## 2022-07-01 NOTE — Progress Notes (Deleted)
PCP: Steele Sizer, MD   No chief complaint on file.   HPI:      Ms. Jessica [name] is a 57 y.o. [id number] whose LMP was No LMP recorded. Patient has had a hysterectomy., presents today for her NP> 3 yrs annual examination.  Her menses are absent due to lap supracx hyst with unilateral SO due to endometriosis, 2008 with Dr Rayford Halsted  She {does:18564} have vasomotor sx.   Sex activity: {sex active: 315163}. She {does:18564} have vaginal dryness.  Last Pap: 11/23/19  Results were: ASCUS with POSITIVE high risk HPV with PCP. Repeat pap due. Still has cx.  Hx of STDs: {STD hx:14358}  Last mammogram: 12/16/19  Results were: normal--routine follow-up in 12 months There is no FH of breast cancer. There is no FH of ovarian cancer. The patient {does:18564} do self-breast exams.  Colonoscopy: 6/21 at [place] with polyps;  Repeat due after 1 years.   Tobacco use: {tob:20664} Alcohol use: {Alcohol:11675} No drug use Exercise: {exercise:31265}  She {does:18564} get adequate calcium and Vitamin D in her diet.  Labs with PCP.   Patient Active Problem List   Diagnosis Date Noted   ASCUS with positive high risk HPV cervical 05/06/2022   Tubular adenoma of colon 05/06/2022   Iron deficiency anemia 01/09/2018   Cervical high risk HPV (human papillomavirus) test positive 04/08/2017   History of gastric bypass    Ulcer jejunum    Adenoma of left adrenal gland 02/18/2017   Abnormal TSH 08/30/2016   Bipolar disorder ([place]) 12/02/2015   HAV (hallux abducto valgus) 11/22/2015   Bunion of great toe 11/08/2015   Attention-deficit hyperactivity disorder, other type 10/28/2015   Sciatica 04/22/2015   B12 deficiency 12/01/2014   History of bariatric surgery 12/01/2014   Anxiety, generalized 12/01/2014   Gastro-esophageal reflux disease without esophagitis 12/01/2014   H/O elevated lipids 12/01/2014   H/O: HTN (hypertension) 12/01/2014   H/O: obesity 12/01/2014   Hodgkin's disease in  remission ([place]) 12/01/2014   Current tobacco use 12/01/2014   Insomnia 03/24/2008   Abnormal serum level of alkaline phosphatase 11/08/2006   Chronic recurrent major depressive disorder ([place]) 11/08/2006    Past Surgical History:  Procedure Laterality Date   ABDOMINAL HYSTERECTOMY     BUNIONECTOMY Right    CARPAL TUNNEL RELEASE     COLONOSCOPY     multiple   COLONOSCOPY WITH PROPOFOL N/A 11/20/2019   Procedure: COLONOSCOPY WITH PROPOFOL;  Surgeon: Lin Landsman, MD;  Location[facility] ENDOSCOPY;  Service: Gastroenterology;  Laterality: N/A;   ESOPHAGOGASTRODUODENOSCOPY (EGD) WITH PROPOFOL N/A 02/21/2017   Procedure: ESOPHAGOGASTRODUODENOSCOPY (EGD) WITH PROPOFOL;  Surgeon: Lucilla Lame, MD;  Location: [facility];  Service: Gastroenterology;  Laterality: N/A;  Pre-diabetic   excision of neck mass  09/1998   GASTRIC BYPASS  03/12/2011   LAPAROSCOPIC SUPRACERVICAL HYSTERECTOMY  2008   and unilateral salpingoophorectomy/ endometriosis-Dr Kincius   TONSILLECTOMY      Family History  Problem Relation Age of Onset   Cancer Mother 33       colon   Epilepsy Mother    Multiple myeloma Mother    Glaucoma Father    Breast cancer Maternal Grandmother 83       inflamitory breast ca    Social History   Socioeconomic History   Marital status: Married    Spouse name: allen   Number of children: 2   Years of education: Not on file   Highest education level: Associate degree: occupational, Hotel manager, or  vocational program  Occupational History   Occupation: house wife   Tobacco Use   Smoking status: Every Day    Packs/day: 0.50    Years: 16.00    Total pack years: 8.00    Types: Cigarettes, E-cigarettes    Start date: 06/11/2000    Last attempt to quit: 06/03/2020    Years since quitting: 2.0   Smokeless tobacco: Never   Tobacco comments:    Only on e-cigarettes   Vaping Use   Vaping Use: Some days  Substance and Sexual Activity   Alcohol use: No    Alcohol/week: 0.0  standard drinks of alcohol    Comment: rarely   Drug use: No   Sexual activity: Yes    Partners: Male    Birth control/protection: Surgical  Other Topics Concern   Not on file  Social History Narrative   Not on file   Social Determinants of Health   Financial Resource Strain: Low Risk  (02/28/2022)   Overall Financial Resource Strain (CARDIA)    Difficulty of Paying Living Expenses: Not hard at all  Food Insecurity: No Food Insecurity (02/28/2022)   Hunger Vital Sign    Worried About Running Out of Food in the Last Year: Never true    Ran Out of Food in the Last Year: Never true  Transportation Needs: No Transportation Needs (02/28/2022)   PRAPARE - Hydrologist (Medical): No    Lack of Transportation (Non-Medical): No  Physical Activity: Insufficiently Active (02/28/2022)   Exercise Vital Sign    Days of Exercise per Week: 2 days    Minutes of Exercise per Session: 10 min  Stress: Stress Concern Present (02/28/2022)   [place]    Feeling of Stress : Rather much  Social Connections: Socially Integrated (02/28/2022)   Social Connection and Isolation Panel [NHANES]    Frequency of Communication with Friends and Family: More than three times a week    Frequency of Social Gatherings with Friends and Family: Once a week    Attends Religious Services: More than 4 times per year    Active Member of [employer] or Organizations: Yes    Attends Music therapist: More than 4 times per year    Marital Status: Married  Human resources officer Violence: Not At Risk (02/28/2022)   Humiliation, Afraid, Rape, and Kick questionnaire    Fear of Current or Ex-Partner: No    Emotionally Abused: No    Physically Abused: No    Sexually Abused: No     Current Outpatient Medications:    Ascorbic Acid (VITAMIN C) 1000 MG tablet, Take 1 tablet (1,000 mg total) by mouth daily., Disp: 100 tablet, Rfl: 1    atorvastatin (LIPITOR) 40 MG tablet, Take 1 tablet (40 mg total) by mouth daily., Disp: 90 tablet, Rfl: 1   Cholecalciferol (VITAMIN D) 50 MCG (2000 UT) CAPS, Take 1 capsule (2,000 Units total) by mouth daily., Disp: 30 capsule, Rfl: 0   cyclobenzaprine (FLEXERIL) 5 MG tablet, TAKE 1 TABLET BY MOUTH AT BEDTIME AS NEEDED FOR MUSCLE SPASM, Disp: 90 tablet, Rfl: 1   DULoxetine (CYMBALTA) 60 MG capsule, Take 60 mg by mouth daily., Disp: , Rfl:    DULoxetine (CYMBALTA) 60 MG capsule, Take 1 capsule (60 mg total) by mouth daily., Disp: 90 capsule, Rfl: 0   gabapentin (NEURONTIN) 100 MG capsule, TAKE 2 CAPSULES (200 MG) BY MOUTH AT BEDTIME., Disp: 180 capsule, Rfl:  1   levothyroxine (SYNTHROID) 25 MCG tablet, Take 1 tablet (25 mcg total) by mouth daily before breakfast., Disp: 90 tablet, Rfl: 1   lisdexamfetamine (VYVANSE) 40 MG capsule, Take 1 capsule (40 mg total) by mouth every morning., Disp: 30 capsule, Rfl: 0   lisdexamfetamine (VYVANSE) 40 MG capsule, Take 40 mg by mouth every morning., Disp: , Rfl:    [START ON 07/02/2022] lisdexamfetamine (VYVANSE) 40 MG capsule, Take 1 capsule (40 mg total) by mouth daily in the morning., Disp: 30 capsule, Rfl: 0   lisdexamfetamine (VYVANSE) 40 MG capsule, Take 1 capsule (40 mg total) by mouth every morning., Disp: 30 capsule, Rfl: 0   Multiple Vitamins-Minerals (MULTI-VITAMIN GUMMIES) CHEW, Chew 2 capsules by mouth daily., Disp: 30 tablet, Rfl: 0   omeprazole (PRILOSEC) 40 MG capsule, TAKE 1 CAPSULE BY MOUTH EVERY DAY, Disp: 30 capsule, Rfl: 0   QUEtiapine (SEROQUEL) 300 MG tablet, Take 300 mg by mouth at bedtime., Disp: , Rfl:    QUEtiapine (SEROQUEL) 300 MG tablet, Take 1 tablet (300 mg total) by mouth nightly., Disp: 30 tablet, Rfl: 0   QUEtiapine (SEROQUEL) 300 MG tablet, Take 1 tablet (300 mg total) by mouth at bedtime., Disp: 90 tablet, Rfl: 0   Semaglutide, 1 MG/DOSE, (OZEMPIC, 1 MG/DOSE,) 4 MG/3ML SOPN, Inject 1 mg as directed once a week., Disp: 9 mL,  Rfl: 1     ROS:  Review of Systems BREAST: No symptoms    Objective: There were no vitals taken for this visit.   OBGyn Exam  Results: No results found for this or any previous visit (from the past 24 hour(s)).  Assessment/Plan:  No diagnosis found.   No orders of the defined types were placed in this encounter.           GYN counsel {counseling: 16159}    F/U  No follow-ups on file.  Yerik Zeringue B. Josefa [place], PA-C 07/01/2022 4:42 PM

## 2022-07-02 ENCOUNTER — Other Ambulatory Visit: Payer: Self-pay

## 2022-07-02 ENCOUNTER — Ambulatory Visit: Payer: BC Managed Care – PPO | Admitting: Obstetrics and Gynecology

## 2022-07-06 ENCOUNTER — Other Ambulatory Visit: Payer: Self-pay

## 2022-07-06 MED ORDER — QUETIAPINE FUMARATE 300 MG PO TABS
300.0000 mg | ORAL_TABLET | Freq: Every evening | ORAL | 0 refills | Status: DC
  Filled 2022-07-06 – 2022-08-30 (×4): qty 30, 30d supply, fill #0

## 2022-07-25 ENCOUNTER — Other Ambulatory Visit: Payer: Self-pay

## 2022-07-26 ENCOUNTER — Other Ambulatory Visit: Payer: Self-pay

## 2022-07-30 ENCOUNTER — Other Ambulatory Visit: Payer: Self-pay

## 2022-08-01 ENCOUNTER — Other Ambulatory Visit: Payer: Self-pay

## 2022-08-08 ENCOUNTER — Other Ambulatory Visit: Payer: Self-pay

## 2022-08-08 MED ORDER — LISDEXAMFETAMINE DIMESYLATE 40 MG PO CAPS: 40.0000 mg | ORAL_CAPSULE | Freq: Every morning | ORAL | 0 refills | Status: DC

## 2022-08-08 MED ORDER — DULOXETINE HCL 60 MG PO CPEP
60.0000 mg | ORAL_CAPSULE | Freq: Every day | ORAL | 0 refills | Status: DC
  Filled 2022-08-08: qty 90, 90d supply, fill #0

## 2022-08-08 MED ORDER — QUETIAPINE FUMARATE 300 MG PO TABS
300.0000 mg | ORAL_TABLET | Freq: Every evening | ORAL | 0 refills | Status: DC
  Filled 2022-08-08 – 2022-10-17 (×5): qty 90, 90d supply, fill #0

## 2022-08-08 MED ORDER — AMPHETAMINE-DEXTROAMPHET ER 20 MG PO CP24
20.0000 mg | ORAL_CAPSULE | Freq: Every morning | ORAL | 0 refills | Status: DC
  Filled 2022-08-08 – 2022-08-09 (×2): qty 30, 30d supply, fill #0

## 2022-08-09 ENCOUNTER — Other Ambulatory Visit: Payer: Self-pay

## 2022-08-11 ENCOUNTER — Other Ambulatory Visit: Payer: Self-pay | Admitting: Family Medicine

## 2022-08-11 ENCOUNTER — Other Ambulatory Visit: Payer: Self-pay

## 2022-08-11 DIAGNOSIS — K219 Gastro-esophageal reflux disease without esophagitis: Secondary | ICD-10-CM

## 2022-08-11 MED ORDER — QUETIAPINE FUMARATE 300 MG PO TABS
300.0000 mg | ORAL_TABLET | Freq: Every day | ORAL | 0 refills | Status: DC
  Filled 2022-08-11 – 2022-11-12 (×3): qty 90, 90d supply, fill #0

## 2022-08-11 MED FILL — Cyclobenzaprine HCl Tab 5 MG: ORAL | 90 days supply | Qty: 90 | Fill #1 | Status: CN

## 2022-08-12 ENCOUNTER — Other Ambulatory Visit: Payer: Self-pay

## 2022-08-12 MED ORDER — OMEPRAZOLE 40 MG PO CPDR
40.0000 mg | DELAYED_RELEASE_CAPSULE | Freq: Every day | ORAL | 0 refills | Status: DC
  Filled 2022-08-12: qty 30, 30d supply, fill #0

## 2022-08-14 ENCOUNTER — Other Ambulatory Visit: Payer: Self-pay

## 2022-08-16 ENCOUNTER — Other Ambulatory Visit: Payer: Self-pay

## 2022-08-21 ENCOUNTER — Other Ambulatory Visit: Payer: Self-pay

## 2022-08-30 ENCOUNTER — Other Ambulatory Visit: Payer: Self-pay

## 2022-08-30 MED FILL — Cyclobenzaprine HCl Tab 5 MG: ORAL | 90 days supply | Qty: 90 | Fill #1 | Status: AC

## 2022-09-02 ENCOUNTER — Other Ambulatory Visit: Payer: Self-pay

## 2022-09-04 ENCOUNTER — Other Ambulatory Visit: Payer: Self-pay

## 2022-09-06 ENCOUNTER — Other Ambulatory Visit: Payer: Self-pay

## 2022-09-07 ENCOUNTER — Other Ambulatory Visit: Payer: Self-pay

## 2022-09-10 ENCOUNTER — Other Ambulatory Visit: Payer: Self-pay

## 2022-09-12 ENCOUNTER — Inpatient Hospital Stay: Payer: BC Managed Care – PPO | Attending: Oncology

## 2022-09-14 MED FILL — Iron Sucrose Inj 20 MG/ML (Fe Equiv): INTRAVENOUS | Qty: 10 | Status: AC

## 2022-09-17 ENCOUNTER — Inpatient Hospital Stay: Payer: BC Managed Care – PPO

## 2022-09-17 ENCOUNTER — Other Ambulatory Visit: Payer: Self-pay

## 2022-09-17 ENCOUNTER — Inpatient Hospital Stay: Payer: BC Managed Care – PPO | Admitting: Oncology

## 2022-09-19 ENCOUNTER — Other Ambulatory Visit: Payer: Self-pay

## 2022-09-19 MED ORDER — AMPHETAMINE-DEXTROAMPHET ER 20 MG PO CP24
20.0000 mg | ORAL_CAPSULE | ORAL | 0 refills | Status: DC
  Filled 2022-09-19: qty 30, 30d supply, fill #0

## 2022-09-19 MED ORDER — ATOMOXETINE HCL 40 MG PO CAPS
ORAL_CAPSULE | ORAL | 0 refills | Status: DC
  Filled 2022-09-19 – 2022-09-20 (×2): qty 60, 30d supply, fill #0

## 2022-09-20 ENCOUNTER — Other Ambulatory Visit: Payer: Self-pay

## 2022-09-21 ENCOUNTER — Other Ambulatory Visit: Payer: Self-pay

## 2022-10-17 ENCOUNTER — Other Ambulatory Visit: Payer: Self-pay

## 2022-10-17 ENCOUNTER — Other Ambulatory Visit: Payer: Self-pay | Admitting: Family Medicine

## 2022-10-17 DIAGNOSIS — K219 Gastro-esophageal reflux disease without esophagitis: Secondary | ICD-10-CM

## 2022-10-17 MED ORDER — OXCARBAZEPINE 150 MG PO TABS
150.0000 mg | ORAL_TABLET | Freq: Two times a day (BID) | ORAL | 0 refills | Status: DC
  Filled 2022-10-17: qty 180, 90d supply, fill #0

## 2022-10-17 MED ORDER — AMPHETAMINE-DEXTROAMPHET ER 20 MG PO CP24
20.0000 mg | ORAL_CAPSULE | Freq: Every morning | ORAL | 0 refills | Status: DC
  Filled 2022-10-17: qty 30, 30d supply, fill #0

## 2022-10-17 MED ORDER — QUETIAPINE FUMARATE 400 MG PO TABS
400.0000 mg | ORAL_TABLET | Freq: Every evening | ORAL | 0 refills | Status: DC
  Filled 2022-10-17: qty 90, 90d supply, fill #0

## 2022-10-17 MED ORDER — OMEPRAZOLE 40 MG PO CPDR
40.0000 mg | DELAYED_RELEASE_CAPSULE | Freq: Every day | ORAL | 0 refills | Status: DC
Start: 2022-10-17 — End: 2022-11-12
  Filled 2022-10-17: qty 30, 30d supply, fill #0

## 2022-10-18 ENCOUNTER — Other Ambulatory Visit: Payer: Self-pay

## 2022-10-19 ENCOUNTER — Other Ambulatory Visit: Payer: Self-pay

## 2022-10-23 ENCOUNTER — Encounter: Payer: Self-pay | Admitting: Oncology

## 2022-10-29 MED FILL — Iron Sucrose Inj 20 MG/ML (Fe Equiv): INTRAVENOUS | Qty: 10 | Status: AC

## 2022-10-30 ENCOUNTER — Other Ambulatory Visit: Payer: Self-pay

## 2022-10-30 ENCOUNTER — Inpatient Hospital Stay: Payer: Commercial Managed Care - HMO | Attending: Oncology | Admitting: Oncology

## 2022-10-30 ENCOUNTER — Inpatient Hospital Stay: Payer: Commercial Managed Care - HMO

## 2022-10-30 ENCOUNTER — Encounter: Payer: Self-pay | Admitting: Oncology

## 2022-10-30 VITALS — BP 102/85 | HR 99 | Temp 98.1°F | Resp 18 | Wt 128.4 lb

## 2022-10-30 DIAGNOSIS — C8190 Hodgkin lymphoma, unspecified, unspecified site: Secondary | ICD-10-CM | POA: Diagnosis not present

## 2022-10-30 DIAGNOSIS — E538 Deficiency of other specified B group vitamins: Secondary | ICD-10-CM | POA: Diagnosis not present

## 2022-10-30 DIAGNOSIS — D500 Iron deficiency anemia secondary to blood loss (chronic): Secondary | ICD-10-CM

## 2022-10-30 DIAGNOSIS — Z8571 Personal history of Hodgkin lymphoma: Secondary | ICD-10-CM | POA: Diagnosis not present

## 2022-10-30 DIAGNOSIS — R634 Abnormal weight loss: Secondary | ICD-10-CM

## 2022-10-30 DIAGNOSIS — Z9884 Bariatric surgery status: Secondary | ICD-10-CM

## 2022-10-30 DIAGNOSIS — D509 Iron deficiency anemia, unspecified: Secondary | ICD-10-CM | POA: Diagnosis present

## 2022-10-30 DIAGNOSIS — F17210 Nicotine dependence, cigarettes, uncomplicated: Secondary | ICD-10-CM | POA: Diagnosis not present

## 2022-10-30 DIAGNOSIS — D508 Other iron deficiency anemias: Secondary | ICD-10-CM

## 2022-10-30 LAB — COMPREHENSIVE METABOLIC PANEL
ALT: 9 U/L (ref 0–44)
AST: 13 U/L — ABNORMAL LOW (ref 15–41)
Albumin: 3.3 g/dL — ABNORMAL LOW (ref 3.5–5.0)
Alkaline Phosphatase: 204 U/L — ABNORMAL HIGH (ref 38–126)
Anion gap: 8 (ref 5–15)
BUN: 13 mg/dL (ref 6–20)
CO2: 29 mmol/L (ref 22–32)
Calcium: 9.1 mg/dL (ref 8.9–10.3)
Chloride: 101 mmol/L (ref 98–111)
Creatinine, Ser: 0.81 mg/dL (ref 0.44–1.00)
GFR, Estimated: >60 mL/min (ref >60)
Glucose, Bld: 94 mg/dL (ref 70–99)
Potassium: 3.6 mmol/L (ref 3.5–5.1)
Sodium: 138 mmol/L (ref 135–145)
Total Bilirubin: 0.5 mg/dL (ref 0.3–1.2)
Total Protein: 7.3 g/dL (ref 6.5–8.1)

## 2022-10-30 LAB — CBC WITH DIFFERENTIAL/PLATELET
Abs Immature Granulocytes: 0.01 10*3/uL (ref 0.00–0.07)
Basophils Absolute: 0 10*3/uL (ref 0.0–0.1)
Basophils Relative: 1 %
Eosinophils Absolute: 0.1 10*3/uL (ref 0.0–0.5)
Eosinophils Relative: 2 %
HCT: 38.1 % (ref 36.0–46.0)
Hemoglobin: 12.4 g/dL (ref 12.0–15.0)
Immature Granulocytes: 0 %
Lymphocytes Relative: 40 %
Lymphs Abs: 2.6 10*3/uL (ref 0.7–4.0)
MCH: 31.7 pg (ref 26.0–34.0)
MCHC: 32.5 g/dL (ref 30.0–36.0)
MCV: 97.4 fL (ref 80.0–100.0)
Monocytes Absolute: 0.4 10*3/uL (ref 0.1–1.0)
Monocytes Relative: 6 %
Neutro Abs: 3.4 10*3/uL (ref 1.7–7.7)
Neutrophils Relative %: 51 %
Platelets: 336 10*3/uL (ref 150–400)
RBC: 3.91 MIL/uL (ref 3.87–5.11)
RDW: 14.4 % (ref 11.5–15.5)
WBC: 6.5 10*3/uL (ref 4.0–10.5)
nRBC: 0 % (ref 0.0–0.2)

## 2022-10-30 LAB — IRON AND TIBC
Iron: 52 ug/dL (ref 28–170)
Saturation Ratios: 19 % (ref 10.4–31.8)
TIBC: 267 ug/dL (ref 250–450)
UIBC: 215 ug/dL

## 2022-10-30 LAB — LACTATE DEHYDROGENASE: LDH: 107 U/L (ref 98–192)

## 2022-10-30 LAB — FERRITIN: Ferritin: 49 ng/mL (ref 11–307)

## 2022-10-30 LAB — FOLATE: Folate: 2.4 ng/mL — ABNORMAL LOW (ref >5.9)

## 2022-10-30 MED ORDER — FOLIC ACID 1 MG PO TABS
1.0000 mg | ORAL_TABLET | Freq: Every day | ORAL | 0 refills | Status: DC
  Filled 2022-10-30: qty 90, 90d supply, fill #0

## 2022-10-30 NOTE — Progress Notes (Signed)
Hematology/Oncology Consult note [phone] [phone]         Patient Care Team: Alba Cory, MD as PCP - General (Family Medicine) Farrel Conners, CNM (Inactive) as Midwife (Certified Nurse Midwife) Midge Minium, MD as Consulting Physician (Gastroenterology) Andee Poles, [facility] (Inactive) (Pharmacist) Rickard Patience, MD as Consulting Physician (Oncology)  REFERRING PROVIDER: Alba Cory, MD    ASSESSMENT & PLAN:   Iron deficiency anemia Labs are reviewed and discussed with patient.' Lab Results  Component Value Date   HGB 12.4 10/30/2022   TIBC 267 10/30/2022   IRONPCTSAT 19 10/30/2022   FERRITIN 49 10/30/2022  Recommend IV Venofer weekly x 2   Hodgkin's disease in remission (HCC) Clinically she has no constitutional symptoms except weight loss which was felt to be due to gastric bypass and medication.     Folate deficiency Recommend Folic acid 1mg  daily. Rx sent.   Weight loss ? Side effects due to Strattera.she is on cymbalta which may increase Strattera.level. Recommend her to discuss with prescribing provider.   Orders Placed This Encounter  Procedures   CBC with Differential (Cancer Center Only)    Standing Status:   Future    Standing Expiration Date:   10/30/2023   Ferritin    Standing Status:   Future    Standing Expiration Date:   10/30/2023   Iron and TIBC    Standing Status:   Future    Standing Expiration Date:   10/30/2023   Folate    Standing Status:   Future    Standing Expiration Date:   10/30/2023   Follow-up in 3 months All questions were answered. The patient knows to call the clinic with any problems, questions or concerns.  Rickard Patience, MD, PhD [facility] Hematology Oncology 10/30/2022   CHIEF COMPLAINTS/REASON FOR VISIT:  Iron deficiency anemia, history of Hodgkin's lymphoma.   HISTORY OF PRESENTING ILLNESS:   Jessica [name] is a  57 y.o.  female with PMH listed below was seen in consultation at  the request of  Alba Cory, MD  for evaluation of Iron deficiency anemia, history of Hodgkin's lymphoma  Patient was last seen by me on 02/26/2018. Lost follow up . She presents to re establish care.  12/11/2021 cbc showed Hb 11.8, ferritin 4, iron saturation 11.   History of Hodgkin lymphoma, diagnosed in 2000, treated with ABVD x 4 cycles and mantle radiation. History of gastric bypass in 2013, reports feeling chronic intermittent epigastric pain for the past 12 months.  In September 2019 she went to ER for epigastric pain evaluation. CT abdomen Pelvis showed essentric wall thickening at the gastrojejunal anastomosis. Non specific new mild gastrohepatic ligament lymphadenopathy.  Stable left adrenal adenoma.   Denies hematochezia, hematuria, hematemesis, epistaxis, black tarry stool or easy bruising.   INTERVAL HISTORY Jessica [name] is a 57 y.o. female who has above history reviewed by me today presents for follow up visit for iron deficiency anemia, history of Hodgkin's lymphoma. Patient has had IV iron treatments and tolerated well. Per patient, she was started on Strattera for 2 months.  Since the start of the medication, she has not felt well.  She has lost 16 pounds of weight, decreased appetite.  Denies night sweats, fever.  MEDICAL HISTORY:  Past Medical History:  Diagnosis Date   ADHD (attention deficit hyperactivity disorder)    Anxiety    Bipolar 1 disorder (HCC)    Cancer (HCC)    Hodgkin's Lymphoma 09/1998   Depression  Endometriosis    GERD (gastroesophageal reflux disease)    History of dysplastic nevus 05/04/2020   right abdomen (side) upper/severe   History of gastric bypass 2012   Hodgkin's lymphoma (HCC) 2000   Hypothyroidism    Pre-diabetes    Tobacco use disorder    Ulcer jejunum     SURGICAL HISTORY: Past Surgical History:  Procedure Laterality Date   ABDOMINAL HYSTERECTOMY     BUNIONECTOMY Right    CARPAL TUNNEL RELEASE     COLONOSCOPY      multiple   COLONOSCOPY WITH PROPOFOL N/A 11/20/2019   Procedure: COLONOSCOPY WITH PROPOFOL;  Surgeon: Toney Reil, MD;  Location: [facility] END[facility];  Service: Gastroenterology;  Laterality: N/A;   ESOPHAGOGASTRODUODENOSCOPY (EGD) WITH PROPOFOL N/A 02/21/2017   Procedure: ESOPHAGOGASTRODUODENOSCOPY (EGD) WITH PROPOFOL;  Surgeon: Midge Minium, MD;  Location: [facility];  Service: Gastroenterology;  Laterality: N/A;  Pre-diabetic   excision of neck mass  09/1998   GASTRIC BYPASS  03/12/2011   LAPAROSCOPIC SUPRACERVICAL HYSTERECTOMY  2008   and unilateral salpingoophorectomy/ endometriosis-Dr Kincius   TONSILLECTOMY      SOCIAL HISTORY: Social History   Socioeconomic History   Marital status: Married    Spouse name: Educational psychologist   Number of children: 2   Years of education: Not on file   Highest education level: Associate degree: occupational, Scientist, product/process development, or vocational program  Occupational History   Occupation: house wife   Tobacco Use   Smoking status: Every Day    Packs/day: 0.50    Years: 16.00    Additional pack years: 0.00    Total pack years: 8.00    Types: Cigarettes, E-cigarettes    Start date: 06/11/2000    Last attempt to quit: 06/03/2020    Years since quitting: 2.4   Smokeless tobacco: Never   Tobacco comments:    Only on e-cigarettes   Vaping Use   Vaping Use: Some days  Substance and Sexual Activity   Alcohol use: No    Alcohol/week: 0.0 standard drinks of alcohol    Comment: rarely   Drug use: No   Sexual activity: Yes    Partners: Male    Birth control/protection: Surgical  Other Topics Concern   Not on file  Social History Narrative   Not on file   Social Determinants of Health   Financial Resource Strain: Low Risk  (02/28/2022)   Overall Financial Resource Strain (CARDIA)    Difficulty of Paying Living Expenses: Not hard at all  Food Insecurity: No Food Insecurity (02/28/2022)   Hunger Vital Sign    Worried About Running Out of Food in the Last  Year: Never true    Ran Out of Food in the Last Year: Never true  Transportation Needs: No Transportation Needs (02/28/2022)   PRAPARE - Administrator, Civil Service (Medical): No    Lack of Transportation (Non-Medical): No  Physical Activity: Insufficiently Active (02/28/2022)   Exercise Vital Sign    Days of Exercise per Week: 2 days    Minutes of Exercise per Session: 10 min  Stress: Stress Concern Present (02/28/2022)   [employer] of Occupational Health - Occupational Stress Questionnaire    Feeling of Stress : Rather much  Social Connections: Socially Integrated (02/28/2022)   Social Connection and Isolation Panel [NHANES]    Frequency of Communication with Friends and Family: More than three times a week    Frequency of Social Gatherings with Friends and Family: Once a week  Attends Religious Services: More than 4 times per year    Active Member of Clubs or Organizations: Yes    Attends Banker Meetings: More than 4 times per year    Marital Status: Married  Catering manager Violence: Not At Risk (02/28/2022)   Humiliation, Afraid, Rape, and Kick questionnaire    Fear of Current or Ex-Partner: No    Emotionally Abused: No    Physically Abused: No    Sexually Abused: No    FAMILY HISTORY: Family History  Problem Relation Age of Onset   Cancer Mother 36       colon   Epilepsy Mother    Multiple myeloma Mother    Glaucoma Father    Breast cancer Maternal Grandmother 33       inflamitory breast ca    ALLERGIES:  is allergic to vicodin [hydrocodone-acetaminophen].  MEDICATIONS:  Current Outpatient Medications  Medication Sig Dispense Refill   amphetamine-dextroamphetamine (ADDERALL XR) 20 MG 24 hr capsule Take 1 capsule (20 mg total) by mouth every morning. 30 capsule 0   Ascorbic Acid (VITAMIN C) 1000 MG tablet Take 1 tablet (1,000 mg total) by mouth daily. 100 tablet 1   atorvastatin (LIPITOR) 40 MG tablet Take 1 tablet (40 mg total) by  mouth daily. 90 tablet 1   Cholecalciferol (VITAMIN D) 50 MCG (2000 UT) CAPS Take 1 capsule (2,000 Units total) by mouth daily. 30 capsule 0   cyclobenzaprine (FLEXERIL) 5 MG tablet TAKE 1 TABLET BY MOUTH AT BEDTIME AS NEEDED FOR MUSCLE SPASM 90 tablet 1   DULoxetine (CYMBALTA) 60 MG capsule Take 1 capsule (60 mg total) by mouth daily. 90 capsule 0   gabapentin (NEURONTIN) 100 MG capsule TAKE 2 CAPSULES (200 MG) BY MOUTH AT BEDTIME. 180 capsule 1   levothyroxine (SYNTHROID) 25 MCG tablet Take 1 tablet (25 mcg total) by mouth daily before breakfast. 90 tablet 1   Multiple Vitamins-Minerals (MULTI-VITAMIN GUMMIES) CHEW Chew 2 capsules by mouth daily. 30 tablet 0   omeprazole (PRILOSEC) 40 MG capsule Take 1 capsule (40 mg total) by mouth daily. 30 capsule 0   QUEtiapine (SEROQUEL) 400 MG tablet Take 1 tablet (400 mg total) by mouth Nightly. 90 tablet 0   atomoxetine (STRATTERA) 40 MG capsule Take 1 capsule (40 mg total) by mouth every morning for 7 days, THEN 2 capsules (80 mg total) every morning (Patient not taking: Reported on 10/30/2022) 60 capsule 0   DULoxetine (CYMBALTA) 60 MG capsule Take 60 mg by mouth daily. (Patient not taking: Reported on 10/30/2022)     lisdexamfetamine (VYVANSE) 40 MG capsule Take 1 capsule (40 mg total) by mouth every morning. (Patient not taking: Reported on 10/30/2022) 30 capsule 0   lisdexamfetamine (VYVANSE) 40 MG capsule Take 40 mg by mouth every morning. (Patient not taking: Reported on 10/30/2022)     lisdexamfetamine (VYVANSE) 40 MG capsule Take 1 capsule (40 mg total) by mouth every morning. (Patient not taking: Reported on 10/30/2022) 30 capsule 0   OXcarbazepine (TRILEPTAL) 150 MG tablet Take 1 tablet (150 mg total) by mouth 2 (two) times daily. (Patient not taking: Reported on 10/30/2022) 180 tablet 0   QUEtiapine (SEROQUEL) 300 MG tablet Take 300 mg by mouth at bedtime. (Patient not taking: Reported on 10/30/2022)     QUEtiapine (SEROQUEL) 300 MG tablet Take 1  tablet (300 mg total) by mouth at bedtime. (Patient not taking: Reported on 10/30/2022) 30 tablet 0   QUEtiapine (SEROQUEL) 300 MG tablet Take 1 tablet (  300 mg total) by mouth nightly. (Patient not taking: Reported on 10/30/2022) 90 tablet 0   QUEtiapine (SEROQUEL) 300 MG tablet Take 1 tablet (300 mg total) by mouth at bedtime. (Patient not taking: Reported on 10/30/2022) 90 tablet 0   No current facility-administered medications for this visit.    Review of Systems  Constitutional:  Positive for fatigue. Negative for appetite change, chills and fever.  HENT:   Negative for hearing loss and voice change.   Eyes:  Negative for eye problems.  Respiratory:  Negative for chest tightness and cough.   Cardiovascular:  Negative for chest pain.  Gastrointestinal:  Negative for abdominal distention, abdominal pain and blood in stool.  Endocrine: Negative for hot flashes.  Genitourinary:  Negative for difficulty urinating and frequency.   Musculoskeletal:  Negative for arthralgias.  Skin:  Negative for itching and rash.  Neurological:  Negative for extremity weakness.  Hematological:  Negative for adenopathy.  Psychiatric/Behavioral:  Negative for confusion.    PHYSICAL EXAMINATION: ECOG PERFORMANCE STATUS: 1 - Symptomatic but completely ambulatory Vitals:   10/30/22 1344  BP: 102/85  Pulse: 99  Resp: 18  Temp: 98.1 F (36.7 C)   Filed Weights   10/30/22 1344  Weight: 128 lb 6.4 oz (58.2 kg)    Physical Exam Constitutional:      General: She is not in acute distress. HENT:     Head: Normocephalic and atraumatic.  Eyes:     General: No scleral icterus. Cardiovascular:     Rate and Rhythm: Normal rate and regular rhythm.     Heart sounds: Normal heart sounds.  Pulmonary:     Effort: Pulmonary effort is normal. No respiratory distress.     Breath sounds: No wheezing.  Abdominal:     General: Bowel sounds are normal. There is no distension.     Palpations: Abdomen is soft.   Musculoskeletal:        General: No deformity. Normal range of motion.     Cervical back: Normal range of motion and neck supple.  Skin:    General: Skin is warm and dry.     Findings: No erythema or rash.  Neurological:     Mental Status: She is alert and oriented to person, place, and time. Mental status is at baseline.     Cranial Nerves: No cranial nerve deficit.     Coordination: Coordination normal.  Psychiatric:        Mood and Affect: Mood normal.     LABORATORY DATA:  I have reviewed the data as listed    Latest Ref Rng & Units 10/30/2022    8:40 AM 03/12/2022    4:03 PM 12/11/2021   10:58 AM  CBC  WBC 4.0 - 10.5 K/uL 6.5  7.0  7.1   Hemoglobin 12.0 - 15.0 g/dL 16.1  09.6  04.5   Hematocrit 36.0 - 46.0 % 38.1  38.8  36.9   Platelets 150 - 400 K/uL 336  284  421       Latest Ref Rng & Units 10/30/2022    8:40 AM 12/11/2021   10:58 AM 01/03/2021    9:14 AM  CMP  Glucose 70 - 99 mg/dL 94  88  80   BUN 6 - 20 mg/dL 13  15  11    Creatinine 0.44 - 1.00 mg/dL 4.09  8.11  9.14   Sodium 135 - 145 mmol/L 138  141  141   Potassium 3.5 - 5.1 mmol/L 3.6  4.6  4.4   Chloride 98 - 111 mmol/L 101  105  104   CO2 22 - 32 mmol/L 29  27  29    Calcium 8.9 - 10.3 mg/dL 9.1  9.3  9.2   Total Protein 6.5 - 8.1 g/dL 7.3  7.1  6.8   Total Bilirubin 0.3 - 1.2 mg/dL 0.5  0.5  0.7   Alkaline Phos 38 - 126 U/L 204     AST 15 - 41 U/L 13  14  14    ALT 0 - 44 U/L 9  10  8        RADIOGRAPHIC STUDIES: I have personally reviewed the radiological images as listed and agreed with the findings in the report. No results found.

## 2022-10-30 NOTE — Assessment & Plan Note (Signed)
?   Side effects due to Strattera.she is on cymbalta which may increase Strattera.level. Recommend her to discuss with prescribing provider.

## 2022-10-30 NOTE — Assessment & Plan Note (Signed)
Recommend Folic acid 1mg  daily. Rx sent.

## 2022-10-30 NOTE — Progress Notes (Signed)
Pt here for follow up. Reports that psychiatrist started her on a new medication and her balance and concentration have been off. Pt has had a 16 pound weight loss since Nov, she states it due to medication

## 2022-10-30 NOTE — Assessment & Plan Note (Signed)
Clinically she has no constitutional symptoms except weight loss which was felt to be due to gastric bypass and medication.

## 2022-10-30 NOTE — Assessment & Plan Note (Addendum)
Labs are reviewed and discussed with patient.' Lab Results  Component Value Date   HGB 12.4 10/30/2022   TIBC 267 10/30/2022   IRONPCTSAT 19 10/30/2022   FERRITIN 49 10/30/2022  Recommend IV Venofer weekly x 2

## 2022-10-31 ENCOUNTER — Encounter: Payer: Self-pay | Admitting: Oncology

## 2022-10-31 ENCOUNTER — Telehealth: Payer: Self-pay

## 2022-10-31 LAB — VITAMIN B12: Vitamin B-12: 123 pg/mL — ABNORMAL LOW (ref 180–914)

## 2022-10-31 NOTE — Addendum Note (Signed)
Addended by: Rickard Patience on: 10/31/2022 08:18 AM   Modules accepted: Orders

## 2022-10-31 NOTE — Assessment & Plan Note (Signed)
B12 deficiency, recommend B12 IM injections.

## 2022-10-31 NOTE — Telephone Encounter (Signed)
Called pt x3 and it went straight to VM. Detailed message left with CB#. Asked her to give [place] a call and let us know if she would like to have b12 injections done here.

## 2022-10-31 NOTE — Telephone Encounter (Signed)
-----   Message from Rickard Patience, MD sent at 10/31/2022  8:21 AM EDT ----- B12 is low.  Please check with patient if she is getting B12 injections with primary care physician and how often?.  I recommend her to get weekly B12 injeciton x 4 followed by monthly injection.  She can ask pcp to give or get injections with [place].  Thanks.

## 2022-10-31 NOTE — Addendum Note (Signed)
Addended by: Leone Putman on: 10/31/2022 08:18 AM   Modules accepted: Orders  

## 2022-11-01 ENCOUNTER — Other Ambulatory Visit: Payer: Self-pay

## 2022-11-01 ENCOUNTER — Encounter: Payer: Self-pay | Admitting: Oncology

## 2022-11-01 MED ORDER — NEEDLES & SYRINGES MISC
0 refills | Status: DC
  Filled 2022-11-01 – 2022-12-04 (×4): qty 4, fill #0

## 2022-11-01 MED ORDER — CYANOCOBALAMIN 1000 MCG/ML IJ SOLN
1000.0000 ug | INTRAMUSCULAR | 0 refills | Status: DC
  Filled 2022-11-01: qty 4, 28d supply, fill #0

## 2022-11-02 ENCOUNTER — Other Ambulatory Visit: Payer: Self-pay | Admitting: Family Medicine

## 2022-11-02 DIAGNOSIS — M545 Low back pain, unspecified: Secondary | ICD-10-CM

## 2022-11-02 DIAGNOSIS — F3162 Bipolar disorder, current episode mixed, moderate: Secondary | ICD-10-CM

## 2022-11-02 DIAGNOSIS — F411 Generalized anxiety disorder: Secondary | ICD-10-CM

## 2022-11-03 MED ORDER — CYCLOBENZAPRINE HCL 5 MG PO TABS
5.0000 mg | ORAL_TABLET | Freq: Every day | ORAL | 0 refills | Status: DC
Start: 2022-11-03 — End: 2022-11-20
  Filled 2022-11-03 – 2022-11-12 (×2): qty 30, 30d supply, fill #0

## 2022-11-03 MED ORDER — GABAPENTIN 100 MG PO CAPS
200.0000 mg | ORAL_CAPSULE | Freq: Every day | ORAL | 0 refills | Status: DC
Start: 2022-11-03 — End: 2022-11-20
  Filled 2022-11-03: qty 60, 30d supply, fill #0

## 2022-11-05 ENCOUNTER — Other Ambulatory Visit: Payer: Self-pay

## 2022-11-06 ENCOUNTER — Other Ambulatory Visit: Payer: Self-pay

## 2022-11-12 ENCOUNTER — Other Ambulatory Visit: Payer: Self-pay | Admitting: Oncology

## 2022-11-12 ENCOUNTER — Other Ambulatory Visit: Payer: Self-pay

## 2022-11-12 ENCOUNTER — Other Ambulatory Visit: Payer: Self-pay | Admitting: Family Medicine

## 2022-11-12 DIAGNOSIS — K219 Gastro-esophageal reflux disease without esophagitis: Secondary | ICD-10-CM

## 2022-11-12 DIAGNOSIS — F3162 Bipolar disorder, current episode mixed, moderate: Secondary | ICD-10-CM

## 2022-11-12 DIAGNOSIS — F411 Generalized anxiety disorder: Secondary | ICD-10-CM

## 2022-11-12 MED ORDER — OMEPRAZOLE 40 MG PO CPDR
40.0000 mg | DELAYED_RELEASE_CAPSULE | Freq: Every day | ORAL | 0 refills | Status: DC
Start: 2022-11-12 — End: 2022-11-20
  Filled 2022-11-12: qty 30, 30d supply, fill #0

## 2022-11-13 ENCOUNTER — Other Ambulatory Visit: Payer: Self-pay

## 2022-11-13 ENCOUNTER — Encounter: Payer: Self-pay | Admitting: Oncology

## 2022-11-14 ENCOUNTER — Other Ambulatory Visit: Payer: Self-pay

## 2022-11-14 MED ORDER — DULOXETINE HCL 60 MG PO CPEP
60.0000 mg | ORAL_CAPSULE | Freq: Every day | ORAL | 0 refills | Status: DC
  Filled 2022-11-14: qty 90, 90d supply, fill #0

## 2022-11-14 MED ORDER — AMPHETAMINE-DEXTROAMPHET ER 25 MG PO CP24
25.0000 mg | ORAL_CAPSULE | Freq: Every morning | ORAL | 0 refills | Status: DC
  Filled 2022-11-14 – 2022-11-15 (×2): qty 30, 30d supply, fill #0

## 2022-11-15 ENCOUNTER — Other Ambulatory Visit: Payer: Self-pay

## 2022-11-15 ENCOUNTER — Other Ambulatory Visit: Payer: Self-pay | Admitting: Family Medicine

## 2022-11-16 ENCOUNTER — Other Ambulatory Visit: Payer: Self-pay

## 2022-11-19 NOTE — Progress Notes (Unsigned)
Name: Jessica [name]   MRN: [id number]    DOB: 1965-06-29   Date:11/20/2022       Progress Note  Subjective  Chief Complaint  Follow Up  HPI  Pre-diabetes:hgbA1C was back in 2013 was 6.3%, but down to 5.3 % with weight loss , she was gradually gaining weight after bariatric surgery ( 2012 )  and we started her on Metformin 08/2016 , followed by Trulicity but we stopped medication when she lost a lot of weight in 2021, gradually started to gain weight again and we started her back on Ozempic July 2023, weight is down to 128 lbs, she has been out of Ozempic since Spring of 2024 since no longer covered by insurance, but weight is staying stable.   Malnutrition: she lost over 10 % of her weight over the past 6 months. She also has low albumin and gets dizzy when she stands up , causing her to fall.   B12 deficiency/Low B12 : she is now getting B12 injections and also folic acid daily , reviewed labs done by Dr. Cathie Hoops recently   Bipolar: no longer seeing Dr.Eappen , had a gap due to cost but since last visit 06/23 we advised her to go back due to increase in depression triggered by her mother's death and compound grief, however she has been dismissed from Dr. Elvera Maria practice from lack of follow up. We placed a new referral for a Beautiful mind and appointment and she is currently seeing Zorita Pang  PA and seroquel dose was increased to 400 mg at night, still taking Duloxetine 60 mg and now off Vyvanse and on Adderal XR 25 mg  for diagnoses of ADD, gabapentin 200 mg for anxiety. She has lack of impulse control and has been fired from 4 jobs in the past 5 years. 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   Adult hypothyroidism:  she has been on medication since 2020, last TSH was at goal at 1.96 . She has fatigue but likely multifactorial. No change in bowel movements . We will recheck labs    Hodgkin's lymphoma:she went back to see Dr. Cathie Hoops in 2019 ,  last CBC was normal, iron storage was low she as seen by hematologist and had iron infusions but doing well now    Atherosclerosis of aorta: she is on Atorvastatin and denies side effects, last LDL at goal , last LDL was 61 , continue medication. Recheck levels yearly   Vitamin D deficiency: she is taking supplementation.   Senile purpura: both arms   Patient Active Problem List   Diagnosis Date Noted   Senile purpura (HCC) 11/20/2022   Folate deficiency 10/30/2022   Weight loss 10/30/2022   ASCUS with positive high risk HPV cervical 05/06/2022   Tubular adenoma of colon 05/06/2022   Iron deficiency anemia 01/09/2018   Cervical high risk HPV (human papillomavirus) test positive 04/08/2017   History of gastric bypass    Ulcer jejunum    Adenoma of left adrenal gland 02/18/2017   Abnormal TSH 08/30/2016   Bipolar disorder (HCC) 12/02/2015   HAV (hallux abducto valgus) 11/22/2015   Bunion of great toe 11/08/2015   Attention-deficit hyperactivity disorder, other type 10/28/2015   Sciatica 04/22/2015   B12 deficiency 12/01/2014   History of bariatric surgery 12/01/2014   Anxiety, generalized 12/01/2014   Gastro-esophageal reflux disease without esophagitis 12/01/2014   H/O elevated lipids 12/01/2014   H/O: HTN (hypertension) 12/01/2014  H/O: obesity 12/01/2014   Hodgkin's disease in remission (HCC) 12/01/2014   Current tobacco use 12/01/2014   Insomnia 03/24/2008   Abnormal serum level of alkaline phosphatase 11/08/2006   Chronic recurrent major depressive disorder (HCC) 11/08/2006    Past Surgical History:  Procedure Laterality Date   ABDOMINAL HYSTERECTOMY     BUNIONECTOMY Right    CARPAL TUNNEL RELEASE     COLONOSCOPY     multiple   COLONOSCOPY WITH PROPOFOL N/A 11/20/2019   Procedure: COLONOSCOPY WITH PROPOFOL;  Surgeon: Toney Reil, MD;  Location: [facility] END[facility];  Service: Gastroenterology;  Laterality: N/A;   ESOPHAGOGASTRODUODENOSCOPY (EGD) WITH PROPOFOL N/A  02/21/2017   Procedure: ESOPHAGOGASTRODUODENOSCOPY (EGD) WITH PROPOFOL;  Surgeon: Midge Minium, MD;  Location: [facility];  Service: Gastroenterology;  Laterality: N/A;  Pre-diabetic   excision of neck mass  09/1998   GASTRIC BYPASS  03/12/2011   LAPAROSCOPIC SUPRACERVICAL HYSTERECTOMY  2008   and unilateral salpingoophorectomy/ endometriosis-Dr Kincius   TONSILLECTOMY      Family History  Problem Relation Age of Onset   Cancer Mother 77       colon   Epilepsy Mother    Multiple myeloma Mother    Glaucoma Father    Breast cancer Maternal Grandmother 63       inflamitory breast ca    Social History   Tobacco Use   Smoking status: Every Day    Packs/day: 0.50    Years: 16.00    Additional pack years: 0.00    Total pack years: 8.00    Types: Cigarettes, E-cigarettes    Start date: 06/11/2000    Last attempt to quit: 06/03/2020    Years since quitting: 2.4   Smokeless tobacco: Never   Tobacco comments:    Only on e-cigarettes   Substance Use Topics   Alcohol use: No    Alcohol/week: 0.0 standard drinks of alcohol    Comment: rarely     Current Outpatient Medications:    amphetamine-dextroamphetamine (ADDERALL XR) 20 MG 24 hr capsule, Take 1 capsule (20 mg total) by mouth every morning., Disp: 30 capsule, Rfl: 0   amphetamine-dextroamphetamine (ADDERALL XR) 25 MG 24 hr capsule, Take 1 capsule by mouth every morning., Disp: 30 capsule, Rfl: 0   Cholecalciferol (VITAMIN D) 50 MCG (2000 UT) CAPS, Take 1 capsule (2,000 Units total) by mouth daily., Disp: 30 capsule, Rfl: 0   cyanocobalamin (VITAMIN B12) 1000 MCG/ML injection, Inject 1 mL (1,000 mcg total) into the muscle See admin instructions. Inject 1mL IM, once a week for 4 weeks, Disp: 4 mL, Rfl: 0   DULoxetine (CYMBALTA) 60 MG capsule, Take 1 capsule (60 mg total) by mouth daily., Disp: 90 capsule, Rfl: 0   folic acid (FOLVITE) 1 MG tablet, Take 1 tablet (1 mg total) by mouth daily., Disp: 90 tablet, Rfl: 0    levothyroxine (SYNTHROID) 25 MCG tablet, Take 1 tablet (25 mcg total) by mouth daily before breakfast., Disp: 90 tablet, Rfl: 1   Multiple Vitamins-Minerals (MULTI-VITAMIN GUMMIES) CHEW, Chew 2 capsules by mouth daily., Disp: 30 tablet, Rfl: 0   Needles & Syringes MISC, Inject 1mL weekly for 4 weeks, Disp: 4 each, Rfl: 0   QUEtiapine (SEROQUEL) 400 MG tablet, Take 400 mg by mouth at bedtime., Disp: , Rfl:    atorvastatin (LIPITOR) 40 MG tablet, Take 1 tablet (40 mg total) by mouth daily., Disp: 90 tablet, Rfl: 1   cyclobenzaprine (FLEXERIL) 5 MG tablet, Take 1 tablet (5 mg total) by mouth  at bedtime., Disp: 90 tablet, Rfl: 0   gabapentin (NEURONTIN) 100 MG capsule, Take 2 capsules (200 mg total) by mouth at bedtime., Disp: 180 capsule, Rfl: 0   omeprazole (PRILOSEC) 40 MG capsule, Take 1 capsule (40 mg total) by mouth daily., Disp: 90 capsule, Rfl: 0  Allergies  Allergen Reactions   Vicodin [Hydrocodone-Acetaminophen] Other (See Comments)    Has really bad headaches/ migraine      I personally reviewed active problem list, medication list, allergies, family history, social history, health maintenance with the patient/caregiver today.   ROS  Constitutional: Negative for fever or weight change.  Respiratory: Negative for cough and shortness of breath.   Cardiovascular: Negative for chest pain or palpitations.  Gastrointestinal: Negative for abdominal pain, no bowel changes.  Musculoskeletal: Negative for gait problem or joint swelling.  Skin: Negative for rash.  Neurological: Negative for dizziness or headache.  No other specific complaints in a complete review of systems (except as listed in HPI above).   Objective  Orthostatic VS for the past 72 hrs (Last 3 readings):  Orthostatic BP Patient Position BP Location Cuff Size Orthostatic Pulse  11/20/22 1036 110/68 Standing Left Arm Normal 95  11/20/22 1035 118/72 Sitting Left Arm Normal 92  11/20/22 1034 128/76 Supine Left Arm Normal  80    Vitals:   11/20/22 0936  BP: 118/70  Pulse: 96  Resp: 16  SpO2: 99%  Weight: 128 lb (58.1 kg)  Height: 5\' 5"  (1.651 m)    Body mass index is 21.3 kg/m.  Physical Exam  Constitutional: Patient appears well-developed and  malnourished No distress.  HEENT: head atraumatic, normocephalic, pupils equal and reactive to light, neck supple Cardiovascular: Normal rate, regular rhythm and normal heart sounds.  No murmur heard. No BLE edema. Pulmonary/Chest: Effort normal and breath sounds normal. No respiratory distress. Abdominal: Soft.  There is no tenderness. Psychiatric: Patient has a normal mood and affect. behavior is normal. Judgment and thought content normal.    PHQ2/9:    11/20/2022    9:35 AM 05/10/2022   10:28 AM 02/28/2022    8:21 AM 01/09/2022    1:10 PM 12/08/2021    2:46 PM  Depression screen PHQ 2/9  Decreased Interest 1 1 0 1 3  Down, Depressed, Hopeless 1 1 1 1 2   PHQ - 2 Score 2 2 1 2 5   Altered sleeping 3 1 3  0 3  Tired, decreased energy 3 1 3  0 3  Change in appetite 0 1 0 0 0  Feeling bad or failure about yourself  0 0 0 0 0  Trouble concentrating 1 1 0 0 0  Moving slowly or fidgety/restless 0 0 0 0 0  Suicidal thoughts 0 0 0 0 0  PHQ-9 Score 9 6 7 2 11   Difficult doing work/chores  Somewhat difficult       phq 9 is positive   Fall Risk:    11/20/2022    9:35 AM 05/10/2022   10:28 AM 03/05/2022    3:40 PM 02/28/2022    8:14 AM 01/09/2022    1:07 PM  Fall Risk   Falls in the past year? 1 0 0 0 0  Number falls in past yr: 1 0 0 0 0  Injury with Fall? 0 0 0 0 0  Risk for fall due to : Impaired balance/gait No Fall Risks No Fall Risks No Fall Risks No Fall Risks  Follow up Falls prevention discussed Falls prevention discussed;Education provided;Falls evaluation  completed Falls prevention discussed Falls prevention discussed Falls prevention discussed      Functional Status Survey: Is the patient deaf or have difficulty hearing?: No Does the  patient have difficulty seeing, even when wearing glasses/contacts?: No Does the patient have difficulty concentrating, remembering, or making decisions?: Yes Does the patient have difficulty walking or climbing stairs?: No Does the patient have difficulty dressing or bathing?: No Does the patient have difficulty doing errands alone such as visiting a doctor's office or shopping?: No    Assessment & Plan  1. Atherosclerosis of abdominal aorta (HCC)  - atorvastatin (LIPITOR) 40 MG tablet; Take 1 tablet (40 mg total) by mouth daily.  Dispense: 90 tablet; Refill: 1  2. Hodgkin's disease in remission [facility])  Keep follow up with oncologist   3. Mild protein-calorie malnutrition (HCC)  Must eat more, eating mostly protein shakes and not enough   4. Bipolar 1 disorder, mixed, moderate (HCC)  - gabapentin (NEURONTIN) 100 MG capsule; Take 2 capsules (200 mg total) by mouth at bedtime.  Dispense: 180 capsule; Refill: 0  5. Vitamin D deficiency   6. Breast cancer screening by mammogram  - MM 3D SCREENING MAMMOGRAM BILATERAL BREAST; Future  7. B12 deficiency  Continue supplementation   8. Adult hypothyroidism  Recheck labs next visit   9. Recurrent falls  From orthostatic hypotension, advised to gain weight, drink fluids and get up slowly   10. Recurrent low back pain  - cyclobenzaprine (FLEXERIL) 5 MG tablet; Take 1 tablet (5 mg total) by mouth at bedtime.  Dispense: 90 tablet; Refill: 0  11. GAD (generalized anxiety disorder)  - gabapentin (NEURONTIN) 100 MG capsule; Take 2 capsules (200 mg total) by mouth at bedtime.  Dispense: 180 capsule; Refill: 0  12. Gastro-esophageal reflux disease without esophagitis  - omeprazole (PRILOSEC) 40 MG capsule; Take 1 capsule (40 mg total) by mouth daily.  Dispense: 90 capsule; Refill: 0   13. Senile purpura (HCC)

## 2022-11-20 ENCOUNTER — Other Ambulatory Visit: Payer: Self-pay

## 2022-11-20 ENCOUNTER — Encounter: Payer: Self-pay | Admitting: Family Medicine

## 2022-11-20 ENCOUNTER — Ambulatory Visit (INDEPENDENT_AMBULATORY_CARE_PROVIDER_SITE_OTHER): Payer: Commercial Managed Care - HMO | Admitting: Family Medicine

## 2022-11-20 ENCOUNTER — Encounter: Payer: Self-pay | Admitting: Oncology

## 2022-11-20 VITALS — BP 118/70 | HR 96 | Resp 16 | Ht 65.0 in | Wt 128.0 lb

## 2022-11-20 DIAGNOSIS — C8190 Hodgkin lymphoma, unspecified, unspecified site: Secondary | ICD-10-CM | POA: Diagnosis not present

## 2022-11-20 DIAGNOSIS — R296 Repeated falls: Secondary | ICD-10-CM

## 2022-11-20 DIAGNOSIS — I700 Atherosclerosis of aorta: Secondary | ICD-10-CM

## 2022-11-20 DIAGNOSIS — E039 Hypothyroidism, unspecified: Secondary | ICD-10-CM

## 2022-11-20 DIAGNOSIS — E441 Mild protein-calorie malnutrition: Secondary | ICD-10-CM

## 2022-11-20 DIAGNOSIS — D692 Other nonthrombocytopenic purpura: Secondary | ICD-10-CM | POA: Diagnosis not present

## 2022-11-20 DIAGNOSIS — F3162 Bipolar disorder, current episode mixed, moderate: Secondary | ICD-10-CM

## 2022-11-20 DIAGNOSIS — K219 Gastro-esophageal reflux disease without esophagitis: Secondary | ICD-10-CM

## 2022-11-20 DIAGNOSIS — Z1231 Encounter for screening mammogram for malignant neoplasm of breast: Secondary | ICD-10-CM

## 2022-11-20 DIAGNOSIS — M545 Low back pain, unspecified: Secondary | ICD-10-CM

## 2022-11-20 MED ORDER — OMEPRAZOLE 40 MG PO CPDR
40.0000 mg | DELAYED_RELEASE_CAPSULE | Freq: Every day | ORAL | 0 refills | Status: DC
Start: 2022-11-20 — End: 2023-02-20
  Filled 2022-11-20 – 2022-12-31 (×3): qty 90, 90d supply, fill #0

## 2022-11-20 MED ORDER — CYCLOBENZAPRINE HCL 5 MG PO TABS
5.0000 mg | ORAL_TABLET | Freq: Every day | ORAL | 0 refills | Status: DC
Start: 2022-11-20 — End: 2023-02-20
  Filled 2022-11-20 – 2022-11-28 (×2): qty 90, 90d supply, fill #0

## 2022-11-20 MED ORDER — GABAPENTIN 100 MG PO CAPS
200.0000 mg | ORAL_CAPSULE | Freq: Every day | ORAL | 0 refills | Status: AC
Start: 2022-11-20 — End: ?
  Filled 2022-11-20: qty 180, 90d supply, fill #0
  Filled 2022-11-28: qty 42, 21d supply, fill #0
  Filled 2022-11-28: qty 138, 69d supply, fill #0

## 2022-11-20 MED ORDER — ATORVASTATIN CALCIUM 40 MG PO TABS
40.0000 mg | ORAL_TABLET | Freq: Every day | ORAL | 1 refills | Status: DC
Start: 2022-11-20 — End: 2023-05-27
  Filled 2022-11-20 – 2022-12-04 (×2): qty 90, 90d supply, fill #0

## 2022-11-28 ENCOUNTER — Other Ambulatory Visit: Payer: Self-pay

## 2022-11-28 ENCOUNTER — Other Ambulatory Visit: Payer: Self-pay | Admitting: Oncology

## 2022-12-04 ENCOUNTER — Encounter: Payer: Self-pay | Admitting: Oncology

## 2022-12-04 ENCOUNTER — Other Ambulatory Visit: Payer: Self-pay | Admitting: Family Medicine

## 2022-12-04 ENCOUNTER — Other Ambulatory Visit: Payer: Self-pay

## 2022-12-04 MED ORDER — CYANOCOBALAMIN 1000 MCG/ML IJ SOLN
1000.0000 ug | INTRAMUSCULAR | 0 refills | Status: DC
  Filled 2022-12-04: qty 2, 60d supply, fill #0

## 2022-12-04 MED ORDER — NEEDLES & SYRINGES MISC
0 refills | Status: AC
  Filled 2022-12-04 – 2023-01-06 (×6): qty 2, fill #0

## 2022-12-04 NOTE — Progress Notes (Signed)
Error

## 2022-12-04 NOTE — Telephone Encounter (Signed)
Prescriber not at this practice.

## 2022-12-04 NOTE — Addendum Note (Signed)
Addended by: Coralee Rud on: 12/04/2022 01:25 PM   Modules accepted: Orders

## 2022-12-06 ENCOUNTER — Other Ambulatory Visit: Payer: Self-pay

## 2022-12-07 ENCOUNTER — Other Ambulatory Visit: Payer: Self-pay

## 2022-12-09 ENCOUNTER — Other Ambulatory Visit: Payer: Self-pay

## 2022-12-11 ENCOUNTER — Other Ambulatory Visit: Payer: Self-pay

## 2022-12-11 MED ORDER — DULOXETINE HCL 60 MG PO CPEP
60.0000 mg | ORAL_CAPSULE | Freq: Every day | ORAL | 0 refills | Status: DC
  Filled 2022-12-11: qty 90, 90d supply, fill #0

## 2022-12-11 MED ORDER — AMPHETAMINE-DEXTROAMPHET ER 25 MG PO CP24
25.0000 mg | ORAL_CAPSULE | Freq: Every morning | ORAL | 0 refills | Status: DC
  Filled 2022-12-31: qty 30, 30d supply, fill #0

## 2022-12-12 ENCOUNTER — Other Ambulatory Visit: Payer: Self-pay

## 2022-12-12 MED ORDER — QUETIAPINE FUMARATE 400 MG PO TABS
800.0000 mg | ORAL_TABLET | Freq: Every evening | ORAL | 0 refills | Status: DC
  Filled 2022-12-12: qty 180, 90d supply, fill #0

## 2022-12-12 MED ORDER — DULOXETINE HCL 20 MG PO CPEP
20.0000 mg | ORAL_CAPSULE | Freq: Two times a day (BID) | ORAL | 1 refills | Status: DC
  Filled 2022-12-12: qty 60, 30d supply, fill #0

## 2022-12-31 ENCOUNTER — Other Ambulatory Visit: Payer: Self-pay | Admitting: Oncology

## 2022-12-31 ENCOUNTER — Other Ambulatory Visit: Payer: Self-pay | Admitting: Family Medicine

## 2022-12-31 ENCOUNTER — Other Ambulatory Visit: Payer: Self-pay

## 2022-12-31 DIAGNOSIS — M545 Low back pain, unspecified: Secondary | ICD-10-CM

## 2022-12-31 DIAGNOSIS — F3162 Bipolar disorder, current episode mixed, moderate: Secondary | ICD-10-CM

## 2023-01-01 ENCOUNTER — Encounter: Payer: Self-pay | Admitting: Oncology

## 2023-01-01 ENCOUNTER — Other Ambulatory Visit: Payer: Self-pay

## 2023-01-01 MED FILL — Cyanocobalamin Inj 1000 MCG/ML: INTRAMUSCULAR | 60 days supply | Qty: 2 | Fill #0 | Status: CN

## 2023-01-02 ENCOUNTER — Encounter: Payer: Self-pay | Admitting: Oncology

## 2023-01-02 ENCOUNTER — Other Ambulatory Visit: Payer: Self-pay

## 2023-01-02 ENCOUNTER — Encounter: Payer: Self-pay | Admitting: Pharmacist

## 2023-01-02 MED ORDER — METHYLPHENIDATE HCL ER (OSM) 72 MG PO TBCR
72.0000 mg | EXTENDED_RELEASE_TABLET | Freq: Every morning | ORAL | 0 refills | Status: DC
Start: 2023-01-02 — End: 2023-02-20
  Filled 2023-01-02 – 2023-01-07 (×4): qty 30, 30d supply, fill #0

## 2023-01-02 MED FILL — Cyanocobalamin Inj 1000 MCG/ML: INTRAMUSCULAR | 60 days supply | Qty: 2 | Fill #0 | Status: CN

## 2023-01-03 ENCOUNTER — Other Ambulatory Visit: Payer: Self-pay

## 2023-01-03 ENCOUNTER — Other Ambulatory Visit: Payer: Self-pay | Admitting: Family Medicine

## 2023-01-03 DIAGNOSIS — K219 Gastro-esophageal reflux disease without esophagitis: Secondary | ICD-10-CM

## 2023-01-03 DIAGNOSIS — M545 Low back pain, unspecified: Secondary | ICD-10-CM

## 2023-01-03 DIAGNOSIS — F3162 Bipolar disorder, current episode mixed, moderate: Secondary | ICD-10-CM

## 2023-01-03 MED FILL — Cyanocobalamin Inj 1000 MCG/ML: INTRAMUSCULAR | 60 days supply | Qty: 2 | Fill #0 | Status: CN

## 2023-01-04 ENCOUNTER — Other Ambulatory Visit: Payer: Self-pay

## 2023-01-04 ENCOUNTER — Encounter: Payer: Self-pay | Admitting: Pharmacist

## 2023-01-06 ENCOUNTER — Other Ambulatory Visit: Payer: Self-pay

## 2023-01-06 MED FILL — Cyanocobalamin Inj 1000 MCG/ML: INTRAMUSCULAR | 60 days supply | Qty: 2 | Fill #0 | Status: CN

## 2023-01-07 ENCOUNTER — Other Ambulatory Visit: Payer: Self-pay

## 2023-01-08 ENCOUNTER — Other Ambulatory Visit (HOSPITAL_COMMUNITY): Payer: Self-pay

## 2023-01-08 ENCOUNTER — Other Ambulatory Visit: Payer: Self-pay

## 2023-01-09 ENCOUNTER — Other Ambulatory Visit: Payer: Self-pay

## 2023-01-25 ENCOUNTER — Encounter: Payer: Self-pay | Admitting: Oncology

## 2023-01-28 ENCOUNTER — Other Ambulatory Visit: Payer: Self-pay

## 2023-01-28 MED ORDER — AMPHETAMINE-DEXTROAMPHET ER 25 MG PO CP24
25.0000 mg | ORAL_CAPSULE | Freq: Every morning | ORAL | 0 refills | Status: DC
  Filled 2023-01-28: qty 30, 30d supply, fill #0

## 2023-02-01 ENCOUNTER — Inpatient Hospital Stay: Payer: Commercial Managed Care - HMO | Attending: Oncology

## 2023-02-05 ENCOUNTER — Ambulatory Visit: Payer: Commercial Managed Care - HMO

## 2023-02-05 ENCOUNTER — Inpatient Hospital Stay: Payer: Commercial Managed Care - HMO

## 2023-02-05 ENCOUNTER — Inpatient Hospital Stay: Payer: Commercial Managed Care - HMO | Admitting: Oncology

## 2023-02-05 ENCOUNTER — Other Ambulatory Visit: Payer: Self-pay

## 2023-02-13 ENCOUNTER — Other Ambulatory Visit: Payer: Self-pay

## 2023-02-19 NOTE — Progress Notes (Unsigned)
Name: Jessica [name]   MRN: [id number]    DOB: [birth year]   Date:02/20/2023       Progress Note  Subjective  Chief Complaint  Follow Up  HPI  Pre-diabetes: hgbA1C was back in 2013 was 6.3%, but down to 5.3 % with weight loss , she was gradually gaining weight after bariatric surgery ( 2012 )  and we started her on Metformin 08/2016 , followed by Trulicity but we stopped medication when she lost a lot of weight in 2021, gradually started to gain weight again and we started her back on Ozempic July 2023, weight is down to 128 lbs, she has been out of Ozempic since Spring of 2024 but weight is going down, today is 120 lbs.   GERD/history of gastric ulcer: eating smaller portions because it causes stomach pain, taking PPI, she is eating very small portions. Explained the need to have another EGD but she states too costly, she will discuss it with Dr. Cathie Hoops   Malnutrition: she lost over 10 % of her weight over the previous 6  months and now another 8 lbs weight loss, she is eating very small portions , able to eat soup, not eating much meat.  B12 deficiency/Low B12 : she is now getting B12 injections but stopped taking folic acid supplementation , reviewed labs done by Dr. Cathie Hoops and she is due for a follow up soon  Bipolar: no longer seeing Dr.Eappen , had a gap due to cost but since last visit 06/23 we advised her to go back due to increase in depression triggered by her mother's death and compound grief, however she has been dismissed from Dr. Elvera Maria practice from lack of follow up. We placed a new referral for a Beautiful mind and appointment and she is currently seeing Zorita Pang PA and seroquel dose was increased to 400 mg at night,  Adderal XR 25 mg for diagnoses of ADD, gabapentin 200 mg for anxiety, recently was advised to stop taking Duloxetine and she states she has been very moody and unable to sleep. She has lack of impulse control and has been fired from 4 jobs in the past 5 years. 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   Adult hypothyroidism:  she has been on medication since 2020, last TSH was at goal at 1.96 . She has fatigue but likely multifactorial. No change in bowel movements. She has dry skin    Hodgkin's lymphoma:she went back to see Dr. Cathie Hoops in 2019 , last CBC was normal, iron storage was low she as seen by hematologist and had iron infusions last level was at goal    Atherosclerosis of aorta: she is on Atorvastatin and denies side effects, last LDL at goal , last LDL was 61 , continue medication. We will recheck labs today   Vitamin D deficiency: she is taking supplementation, we will recheck labs   Senile purpura: both arms , reassurance given  Patient Active Problem List   Diagnosis Date Noted   Senile purpura (HCC) 11/20/2022   Folate deficiency 10/30/2022   Weight loss 10/30/2022   ASCUS with positive high risk HPV cervical 05/06/2022   Tubular adenoma of colon 05/06/2022   Iron deficiency anemia 01/09/2018   Cervical high risk HPV (human papillomavirus) test positive 04/08/2017   History of gastric bypass    Ulcer jejunum    Adenoma of left adrenal gland 02/18/2017   Abnormal TSH 08/30/2016  Bipolar disorder (HCC) 12/02/2015   HAV (hallux abducto valgus) 11/22/2015   Bunion of great toe 11/08/2015   Attention-deficit hyperactivity disorder, other type 10/28/2015   Sciatica 04/22/2015   B12 deficiency 12/01/2014   History of bariatric surgery 12/01/2014   Anxiety, generalized 12/01/2014   Gastro-esophageal reflux disease without esophagitis 12/01/2014   H/O elevated lipids 12/01/2014   H/O: HTN (hypertension) 12/01/2014   H/O: obesity 12/01/2014   Hodgkin's disease in remission (HCC) 12/01/2014   Current tobacco use 12/01/2014   Insomnia 03/24/2008   Abnormal serum level of alkaline phosphatase 11/08/2006   Chronic recurrent major depressive disorder (HCC) 11/08/2006    Past Surgical  History:  Procedure Laterality Date   ABDOMINAL HYSTERECTOMY     BUNIONECTOMY Right    CARPAL TUNNEL RELEASE     COLONOSCOPY     multiple   COLONOSCOPY WITH PROPOFOL N/A 11/20/2019   Procedure: COLONOSCOPY WITH PROPOFOL;  Surgeon: Toney Reil, MD;  Location: [facility] END[facility];  Service: Gastroenterology;  Laterality: N/A;   ESOPHAGOGASTRODUODENOSCOPY (EGD) WITH PROPOFOL N/A 02/21/2017   Procedure: ESOPHAGOGASTRODUODENOSCOPY (EGD) WITH PROPOFOL;  Surgeon: Midge Minium, MD;  Location: [facility];  Service: Gastroenterology;  Laterality: N/A;  Pre-diabetic   excision of neck mass  09/1998   GASTRIC BYPASS  03/12/2011   LAPAROSCOPIC SUPRACERVICAL HYSTERECTOMY  2008   and unilateral salpingoophorectomy/ endometriosis-Dr Kincius   TONSILLECTOMY      Family History  Problem Relation Age of Onset   Cancer Mother 67       colon   Epilepsy Mother    Multiple myeloma Mother    Glaucoma Father    Breast cancer Maternal Grandmother 50       inflamitory breast ca    Social History   Tobacco Use   Smoking status: Every Day    Current packs/day: 0.00    Average packs/day: 0.5 packs/day for 20.0 years (10.0 ttl pk-yrs)    Types: Cigarettes, E-cigarettes    Start date: 06/11/2000    Last attempt to quit: 06/03/2020    Years since quitting: 2.7   Smokeless tobacco: Never   Tobacco comments:    Only on e-cigarettes   Substance Use Topics   Alcohol use: No    Alcohol/week: 0.0 standard drinks of alcohol    Comment: rarely     Current Outpatient Medications:    amphetamine-dextroamphetamine (ADDERALL XR) 20 MG 24 hr capsule, Take 1 capsule (20 mg total) by mouth every morning., Disp: 30 capsule, Rfl: 0   amphetamine-dextroamphetamine (ADDERALL XR) 25 MG 24 hr capsule, Take 1 capsule by mouth every morning., Disp: 30 capsule, Rfl: 0   atorvastatin (LIPITOR) 40 MG tablet, Take 1 tablet (40 mg total) by mouth daily., Disp: 90 tablet, Rfl: 1   Cholecalciferol (VITAMIN D) 50 MCG  (2000 UT) CAPS, Take 1 capsule (2,000 Units total) by mouth daily., Disp: 30 capsule, Rfl: 0   cyanocobalamin (VITAMIN B12) 1000 MCG/ML injection, Inject 1 mL (1,000 mcg total) into the muscle once monthly for 2 months, Disp: 2 mL, Rfl: 0   cyclobenzaprine (FLEXERIL) 5 MG tablet, Take 1 tablet (5 mg total) by mouth at bedtime., Disp: 90 tablet, Rfl: 0   DULoxetine (CYMBALTA) 20 MG capsule, Take 1 capsule (20 mg total) by mouth 2 (two) times daily., Disp: 60 capsule, Rfl: 1   DULoxetine (CYMBALTA) 60 MG capsule, Take 1 capsule (60 mg total) by mouth daily., Disp: 90 capsule, Rfl: 0   gabapentin (NEURONTIN) 100 MG capsule, Take 2 capsules (  200 mg total) by mouth at bedtime., Disp: 180 capsule, Rfl: 0   levothyroxine (SYNTHROID) 25 MCG tablet, Take 1 tablet (25 mcg total) by mouth daily before breakfast., Disp: 90 tablet, Rfl: 1   Methylphenidate HCl ER, OSM, 72 MG TBCR, Take 1 tablet (72 mg total) by mouth every morning., Disp: 30 tablet, Rfl: 0   Multiple Vitamins-Minerals (MULTI-VITAMIN GUMMIES) CHEW, Chew 2 capsules by mouth daily., Disp: 30 tablet, Rfl: 0   Needles & Syringes MISC, Inject 1mL monthly for 2 months, Disp: 2 each, Rfl: 0   omeprazole (PRILOSEC) 40 MG capsule, Take 1 capsule (40 mg total) by mouth daily., Disp: 90 capsule, Rfl: 0   QUEtiapine (SEROQUEL) 400 MG tablet, Take 400 mg by mouth at bedtime., Disp: , Rfl:   Allergies  Allergen Reactions   Vicodin [Hydrocodone-Acetaminophen] Other (See Comments)    Has really bad headaches/ migraine      I personally reviewed active problem list, medication list, allergies, family history, social history, health maintenance with the patient/caregiver today.   ROS  Ten systems reviewed and is negative except as mentioned in HPI    Objective  Vitals:   02/20/23 0742  BP: 118/68  Pulse: 72  Resp: 16  Weight: 120 lb (54.4 kg)  Height: 5\' 5"  (1.651 m)    Body mass index is 19.97 kg/m.  Physical Exam  Constitutional: Patient  appears well-developed and malnourished  No distress.  HEENT: head atraumatic, normocephalic, pupils equal and reactive to light, neck supple Cardiovascular: Normal rate, regular rhythm and normal heart sounds.  No murmur heard. No BLE edema. Pulmonary/Chest: Effort normal and breath sounds normal. No respiratory distress. Abdominal: Soft.  No tenderness. Psychiatric: Patient has a normal mood and affect. behavior is normal. Judgment and thought content normal.    PHQ2/9:    02/20/2023    7:42 AM 11/20/2022    9:35 AM 05/10/2022   10:28 AM 02/28/2022    8:21 AM 01/09/2022    1:10 PM  Depression screen PHQ 2/9  Decreased Interest 3 1 1  0 1  Down, Depressed, Hopeless 3 1 1 1 1   PHQ - 2 Score 6 2 2 1 2   Altered sleeping 3 3 1 3  0  Tired, decreased energy 3 3 1 3  0  Change in appetite 3 0 1 0 0  Feeling bad or failure about yourself  3 0 0 0 0  Trouble concentrating 3 1 1  0 0  Moving slowly or fidgety/restless 0 0 0 0 0  Suicidal thoughts 0 0 0 0 0  PHQ-9 Score 21 9 6 7 2   Difficult doing work/chores Extremely dIfficult  Somewhat difficult      phq 9 is positive   Fall Risk:    02/20/2023    7:42 AM 11/20/2022    9:35 AM 05/10/2022   10:28 AM 03/05/2022    3:40 PM 02/28/2022    8:14 AM  Fall Risk   Falls in the past year? 1 1 0 0 0  Number falls in past yr: 0 1 0 0 0  Injury with Fall? 1 0 0 0 0  Risk for fall due to : No Fall Risks Impaired balance/gait No Fall Risks No Fall Risks No Fall Risks  Follow up Falls prevention discussed Falls prevention discussed Falls prevention discussed;Education provided;Falls evaluation completed Falls prevention discussed Falls prevention discussed      Functional Status Survey: Is the patient deaf or have difficulty hearing?: No Does the patient have difficulty  seeing, even when wearing glasses/contacts?: No Does the patient have difficulty concentrating, remembering, or making decisions?: No Does the patient have difficulty walking or  climbing stairs?: No Does the patient have difficulty dressing or bathing?: No Does the patient have difficulty doing errands alone such as visiting a doctor's office or shopping?: No    Assessment & Plan  1. Atherosclerosis of abdominal aorta (HCC)  - Lipid panel  2. Hodgkin's disease in remission [facility])  Continue follow up with Dr. Cathie Hoops  3. Bipolar 1 disorder, mixed, moderate (HCC)  Seeing Psychiatrist and recent adjustment of medication and is not doing well  4. Mild protein-calorie malnutrition (HCC)  - COMPLETE METABOLIC PANEL WITH GFR  5. Senile purpura (HCC)  Reassurance given  6. Vitamin D deficiency  - VITAMIN D 25 Hydroxy (Vit-D Deficiency, Fractures)  7. Adult hypothyroidism  - TSH  8. Gastro-esophageal reflux disease without esophagitis  - omeprazole (PRILOSEC) 40 MG capsule; Take 1 capsule (40 mg total) by mouth daily.  Dispense: 90 capsule; Refill: 1  9. GAD (generalized anxiety disorder)   10. Urinary urgency  - CULTURE, URINE COMPREHENSIVE - POCT urinalysis dipstick  Positive nitrites, sending macrobid to pharmacy   11. Recurrent low back pain  - cyclobenzaprine (FLEXERIL) 5 MG tablet; Take 1 tablet (5 mg total) by mouth at bedtime.  Dispense: 90 tablet; Refill: 1  12. Folic acid deficiency  - folic acid (FOLVITE) 1 MG tablet; Take 1 tablet (1 mg total) by mouth daily.  Dispense: 90 tablet; Refill: 0

## 2023-02-20 ENCOUNTER — Ambulatory Visit (INDEPENDENT_AMBULATORY_CARE_PROVIDER_SITE_OTHER): Payer: Commercial Managed Care - HMO | Admitting: Family Medicine

## 2023-02-20 ENCOUNTER — Encounter: Payer: Self-pay | Admitting: Family Medicine

## 2023-02-20 VITALS — BP 118/68 | HR 72 | Resp 16 | Ht 65.0 in | Wt 120.0 lb

## 2023-02-20 DIAGNOSIS — D692 Other nonthrombocytopenic purpura: Secondary | ICD-10-CM

## 2023-02-20 DIAGNOSIS — E559 Vitamin D deficiency, unspecified: Secondary | ICD-10-CM

## 2023-02-20 DIAGNOSIS — E441 Mild protein-calorie malnutrition: Secondary | ICD-10-CM | POA: Diagnosis not present

## 2023-02-20 DIAGNOSIS — I700 Atherosclerosis of aorta: Secondary | ICD-10-CM | POA: Diagnosis not present

## 2023-02-20 DIAGNOSIS — F411 Generalized anxiety disorder: Secondary | ICD-10-CM

## 2023-02-20 DIAGNOSIS — F3162 Bipolar disorder, current episode mixed, moderate: Secondary | ICD-10-CM

## 2023-02-20 DIAGNOSIS — C8190 Hodgkin lymphoma, unspecified, unspecified site: Secondary | ICD-10-CM | POA: Diagnosis not present

## 2023-02-20 DIAGNOSIS — E039 Hypothyroidism, unspecified: Secondary | ICD-10-CM

## 2023-02-20 DIAGNOSIS — M545 Low back pain, unspecified: Secondary | ICD-10-CM

## 2023-02-20 DIAGNOSIS — R3915 Urgency of urination: Secondary | ICD-10-CM

## 2023-02-20 DIAGNOSIS — K219 Gastro-esophageal reflux disease without esophagitis: Secondary | ICD-10-CM

## 2023-02-20 DIAGNOSIS — C819A Hodgkin lymphoma, unspecified, in remission: Secondary | ICD-10-CM

## 2023-02-20 DIAGNOSIS — E538 Deficiency of other specified B group vitamins: Secondary | ICD-10-CM

## 2023-02-20 LAB — POCT URINALYSIS DIPSTICK
Appearance: NORMAL
Bilirubin, UA: NEGATIVE
Blood, UA: NEGATIVE
Glucose, UA: NEGATIVE
Ketones, UA: NEGATIVE
Leukocytes, UA: NEGATIVE
Nitrite, UA: POSITIVE — AB
Protein, UA: POSITIVE — AB
Spec Grav, UA: >=1.03 — AB (ref 1.010–1.025)
Urobilinogen, UA: 0.2 U/dL
pH, UA: 6 (ref 5.0–8.0)

## 2023-02-20 MED ORDER — CYCLOBENZAPRINE HCL 5 MG PO TABS
5.0000 mg | ORAL_TABLET | Freq: Every day | ORAL | 1 refills | Status: DC
Start: 2023-02-20 — End: 2023-09-12

## 2023-02-20 MED ORDER — OMEPRAZOLE 40 MG PO CPDR
40.0000 mg | DELAYED_RELEASE_CAPSULE | Freq: Every day | ORAL | 1 refills | Status: DC
Start: 2023-02-20 — End: 2023-09-12

## 2023-02-20 MED ORDER — FOLIC ACID 1 MG PO TABS
1.0000 mg | ORAL_TABLET | Freq: Every day | ORAL | 0 refills | Status: DC
Start: 2023-02-20 — End: 2023-07-24

## 2023-02-20 MED ORDER — NITROFURANTOIN MONOHYD MACRO 100 MG PO CAPS
100.0000 mg | ORAL_CAPSULE | Freq: Two times a day (BID) | ORAL | 0 refills | Status: DC
Start: 2023-02-20 — End: 2023-02-25

## 2023-02-23 LAB — LIPID PANEL
Cholesterol: 153 mg/dL (ref <200)
HDL: 84 mg/dL (ref >=50)
LDL Cholesterol (Calc): 53 mg/dL
Non-HDL Cholesterol (Calc): 69 mg/dL (ref <130)
Total CHOL/HDL Ratio: 1.8 (calc) (ref <5.0)
Triglycerides: 80 mg/dL (ref <150)

## 2023-02-23 LAB — TSH: TSH: 2.12 m[IU]/L (ref 0.40–4.50)

## 2023-02-23 LAB — CULTURE, URINE COMPREHENSIVE
MICRO NUMBER:: 15452875
SPECIMEN QUALITY:: ADEQUATE

## 2023-02-23 LAB — VITAMIN D 25 HYDROXY (VIT D DEFICIENCY, FRACTURES): Vit D, 25-Hydroxy: 38 ng/mL (ref 30–100)

## 2023-02-23 LAB — COMPLETE METABOLIC PANEL WITH GFR
AG Ratio: 1.1 (calc) (ref 1.0–2.5)
ALT: 15 U/L (ref 6–29)
AST: 11 U/L (ref 10–35)
Albumin: 3.7 g/dL (ref 3.6–5.1)
Alkaline phosphatase (APISO): 238 U/L — ABNORMAL HIGH (ref 37–153)
BUN: 11 mg/dL (ref 7–25)
CO2: 24 mmol/L (ref 20–32)
Calcium: 9.3 mg/dL (ref 8.6–10.4)
Chloride: 103 mmol/L (ref 98–110)
Creat: 0.86 mg/dL (ref 0.50–1.03)
Globulin: 3.3 g/dL (ref 1.9–3.7)
Glucose, Bld: 83 mg/dL (ref 65–99)
Potassium: 3.9 mmol/L (ref 3.5–5.3)
Sodium: 138 mmol/L (ref 135–146)
Total Bilirubin: 0.6 mg/dL (ref 0.2–1.2)
Total Protein: 7 g/dL (ref 6.1–8.1)
eGFR: 79 mL/min/{1.73_m2} (ref >=60)

## 2023-02-25 ENCOUNTER — Other Ambulatory Visit: Payer: Self-pay | Admitting: Family Medicine

## 2023-02-25 ENCOUNTER — Other Ambulatory Visit: Payer: Self-pay

## 2023-02-25 MED ORDER — SULFAMETHOXAZOLE-TRIMETHOPRIM 400-80 MG PO TABS: 1.0000 | ORAL_TABLET | Freq: Two times a day (BID) | ORAL | 0 refills | Status: DC

## 2023-02-25 MED ORDER — DULOXETINE HCL 30 MG PO CPEP
ORAL_CAPSULE | ORAL | 1 refills | Status: AC
  Filled 2023-02-25: qty 60, 34d supply, fill #0

## 2023-03-06 ENCOUNTER — Encounter: Payer: BC Managed Care – PPO | Admitting: Family Medicine

## 2023-03-07 ENCOUNTER — Inpatient Hospital Stay: Payer: Commercial Managed Care - HMO | Attending: Oncology

## 2023-03-07 DIAGNOSIS — Z8571 Personal history of Hodgkin lymphoma: Secondary | ICD-10-CM | POA: Insufficient documentation

## 2023-03-07 DIAGNOSIS — Z9884 Bariatric surgery status: Secondary | ICD-10-CM | POA: Insufficient documentation

## 2023-03-07 DIAGNOSIS — D509 Iron deficiency anemia, unspecified: Secondary | ICD-10-CM | POA: Insufficient documentation

## 2023-03-07 DIAGNOSIS — E538 Deficiency of other specified B group vitamins: Secondary | ICD-10-CM | POA: Insufficient documentation

## 2023-03-11 ENCOUNTER — Inpatient Hospital Stay (HOSPITAL_BASED_OUTPATIENT_CLINIC_OR_DEPARTMENT_OTHER): Payer: Commercial Managed Care - HMO | Admitting: Oncology

## 2023-03-11 ENCOUNTER — Other Ambulatory Visit: Payer: Commercial Managed Care - HMO

## 2023-03-11 ENCOUNTER — Telehealth: Payer: Self-pay

## 2023-03-11 ENCOUNTER — Encounter: Payer: Self-pay | Admitting: Oncology

## 2023-03-11 ENCOUNTER — Inpatient Hospital Stay: Payer: Commercial Managed Care - HMO

## 2023-03-11 ENCOUNTER — Inpatient Hospital Stay: Payer: Commercial Managed Care - HMO | Admitting: Oncology

## 2023-03-11 VITALS — BP 105/73 | HR 97 | Temp 97.8°F | Resp 18 | Wt 120.6 lb

## 2023-03-11 DIAGNOSIS — E538 Deficiency of other specified B group vitamins: Secondary | ICD-10-CM

## 2023-03-11 DIAGNOSIS — C8190 Hodgkin lymphoma, unspecified, unspecified site: Secondary | ICD-10-CM

## 2023-03-11 DIAGNOSIS — D500 Iron deficiency anemia secondary to blood loss (chronic): Secondary | ICD-10-CM

## 2023-03-11 DIAGNOSIS — C819A Hodgkin lymphoma, unspecified, in remission: Secondary | ICD-10-CM

## 2023-03-11 DIAGNOSIS — Z8571 Personal history of Hodgkin lymphoma: Secondary | ICD-10-CM | POA: Diagnosis not present

## 2023-03-11 DIAGNOSIS — Z9884 Bariatric surgery status: Secondary | ICD-10-CM | POA: Diagnosis not present

## 2023-03-11 DIAGNOSIS — D509 Iron deficiency anemia, unspecified: Secondary | ICD-10-CM | POA: Diagnosis present

## 2023-03-11 LAB — CBC WITH DIFFERENTIAL (CANCER CENTER ONLY)
Abs Immature Granulocytes: 0.02 10*3/uL (ref 0.00–0.07)
Basophils Absolute: 0.1 10*3/uL (ref 0.0–0.1)
Basophils Relative: 1 %
Eosinophils Absolute: 0.2 10*3/uL (ref 0.0–0.5)
Eosinophils Relative: 3 %
HCT: 37 % (ref 36.0–46.0)
Hemoglobin: 11.7 g/dL — ABNORMAL LOW (ref 12.0–15.0)
Immature Granulocytes: 0 %
Lymphocytes Relative: 32 %
Lymphs Abs: 2.5 10*3/uL (ref 0.7–4.0)
MCH: 30.1 pg (ref 26.0–34.0)
MCHC: 31.6 g/dL (ref 30.0–36.0)
MCV: 95.1 fL (ref 80.0–100.0)
Monocytes Absolute: 0.4 10*3/uL (ref 0.1–1.0)
Monocytes Relative: 6 %
Neutro Abs: 4.5 10*3/uL (ref 1.7–7.7)
Neutrophils Relative %: 58 %
Platelet Count: 327 10*3/uL (ref 150–400)
RBC: 3.89 MIL/uL (ref 3.87–5.11)
RDW: 13.1 % (ref 11.5–15.5)
WBC Count: 7.6 10*3/uL (ref 4.0–10.5)
nRBC: 0 % (ref 0.0–0.2)

## 2023-03-11 LAB — IRON AND TIBC
Iron: 55 ug/dL (ref 28–170)
Saturation Ratios: 23 % (ref 10.4–31.8)
TIBC: 242 ug/dL — ABNORMAL LOW (ref 250–450)
UIBC: 187 ug/dL

## 2023-03-11 LAB — FERRITIN: Ferritin: 47 ng/mL (ref 11–307)

## 2023-03-11 LAB — FOLATE: Folate: 3.8 ng/mL — ABNORMAL LOW (ref >5.9)

## 2023-03-11 MED ORDER — CYANOCOBALAMIN 1000 MCG/ML IJ SOLN: 1000.0000 ug | INTRAMUSCULAR | 0 refills | Status: AC

## 2023-03-11 NOTE — Assessment & Plan Note (Signed)
She reports not able to tolerate Folvite.  I recommend patient to take a prenatal vitamin daily.

## 2023-03-11 NOTE — Telephone Encounter (Signed)
Jessica [name] will you please schedule and notify patient of date and time.

## 2023-03-11 NOTE — Assessment & Plan Note (Addendum)
Labs are reviewed and discussed with patient.' Lab Results  Component Value Date   HGB 11.7 (L) 03/11/2023   TIBC 242 (L) 03/11/2023   IRONPCTSAT 23 03/11/2023   FERRITIN 47 03/11/2023  Recommend IV Venofer weekly x 2

## 2023-03-11 NOTE — Assessment & Plan Note (Addendum)
B12 deficiency, recommend B12 IM injections. -Patient prefers self injection monthly.  38-month supply sent to pharmacy.  B12 level was canceled as patient just give her B12 injection yesterday and likely the B12 will be falsely elevated.  We will check at the next visit.

## 2023-03-11 NOTE — Assessment & Plan Note (Addendum)
As a risk of deficiency in vitamins.  Will check iron labs, folate, B12 periodically. GERD/history of ulcer recommend patient to follow-up with gastroenterology.

## 2023-03-11 NOTE — Progress Notes (Signed)
Pt here for follow up. Reports that she administered a B 12 injection last night at home and will need refills. Pt also states that she is on antibiotic for UTI.

## 2023-03-11 NOTE — Progress Notes (Signed)
Hematology/Oncology Consult note [phone] [phone]         Patient Care Team: Alba Cory, MD as PCP - General (Family Medicine) Farrel Conners, CNM (Inactive) as Midwife (Certified Nurse Midwife) Midge Minium, MD as Consulting Physician (Gastroenterology) Andee Poles, [facility] (Inactive) (Pharmacist) Rickard Patience, MD as Consulting Physician (Oncology)  REFERRING PROVIDER: Alba Cory, MD    ASSESSMENT & PLAN:   Iron deficiency anemia Labs are reviewed and discussed with patient.' Lab Results  Component Value Date   HGB 11.7 (L) 03/11/2023   TIBC 242 (L) 03/11/2023   IRONPCTSAT 23 03/11/2023   FERRITIN 47 03/11/2023  Recommend IV Venofer weekly x 2   Hodgkin's disease in remission (HCC) Clinically she has no constitutional symptoms except weight loss which was felt to be due to gastric bypass and medication.  Continue monitor clinically.    B12 deficiency B12 deficiency, recommend B12 IM injections. -Patient prefers self injection monthly.  72-month supply sent to pharmacy.  B12 level was canceled as patient just give her B12 injection yesterday and likely the B12 will be falsely elevated.  We will check at the next visit.   History of gastric bypass As a risk of deficiency in vitamins.  Will check iron labs, folate, B12 periodically. GERD/history of ulcer recommend patient to follow-up with gastroenterology.  Folate deficiency She reports not able to tolerate Folvite.  I recommend patient to take a prenatal vitamin daily.  Orders Placed This Encounter  Procedures   CBC with Differential (Cancer Center Only)    Standing Status:   Future    Standing Expiration Date:   03/10/2024   Ferritin    Standing Status:   Future    Standing Expiration Date:   03/10/2024   Iron and TIBC    Standing Status:   Future    Standing Expiration Date:   03/10/2024   Vitamin B12    Standing Status:   Future    Standing Expiration Date:   03/10/2024    Folate    Standing Status:   Future    Standing Expiration Date:   03/10/2024   Follow-up in 6 months All questions were answered. The patient knows to call the clinic with any problems, questions or concerns.  Rickard Patience, MD, PhD [facility] Hematology Oncology 03/11/2023   CHIEF COMPLAINTS/REASON FOR VISIT:  Iron deficiency anemia, history of Hodgkin's lymphoma.   HISTORY OF PRESENTING ILLNESS:   Jessica [name] is a  57 y.o.  female with PMH listed below was seen in consultation at the request of  Alba Cory, MD  for evaluation of Iron deficiency anemia, history of Hodgkin's lymphoma  Patient was last seen by me on 02/26/2018. Lost follow up . She presents to re establish care.  12/11/2021 cbc showed Hb 11.8, ferritin 4, iron saturation 11.   History of Hodgkin lymphoma, diagnosed in 2000, treated with ABVD x 4 cycles and mantle radiation. History of gastric bypass in 2013, reports feeling chronic intermittent epigastric pain for the past 12 months.  In September 2019 she went to ER for epigastric pain evaluation. CT abdomen Pelvis showed essentric wall thickening at the gastrojejunal anastomosis. Non specific new mild gastrohepatic ligament lymphadenopathy.  Stable left adrenal adenoma.   Denies hematochezia, hematuria, hematemesis, epistaxis, black tarry stool or easy bruising.   INTERVAL HISTORY Jessica [name] is a 57 y.o. female who has above history reviewed by me today presents for follow up visit for iron deficiency anemia, history of Hodgkin's  lymphoma. Patient reports not able to tolerate provide 1 mg daily.  It makes her disorientated. She self administrated vitamin B12 injections monthly.  She just give herself a dose yesterday.  She has lost weight, eats very small portions.  Off Ozempic     MEDICAL HISTORY:  Past Medical History:  Diagnosis Date   ADHD (attention deficit hyperactivity disorder)    Anxiety    Bipolar 1 disorder (HCC)    Cancer (HCC)     Hodgkin's Lymphoma 09/1998   Depression    Endometriosis    GERD (gastroesophageal reflux disease)    History of dysplastic nevus 05/04/2020   right abdomen (side) upper/severe   History of gastric bypass 2012   Hodgkin's lymphoma (HCC) 2000   Hypothyroidism    Pre-diabetes    Tobacco use disorder    Ulcer jejunum     SURGICAL HISTORY: Past Surgical History:  Procedure Laterality Date   ABDOMINAL HYSTERECTOMY     BUNIONECTOMY Right    CARPAL TUNNEL RELEASE     COLONOSCOPY     multiple   COLONOSCOPY WITH PROPOFOL N/A 11/20/2019   Procedure: COLONOSCOPY WITH PROPOFOL;  Surgeon: Toney Reil, MD;  Location: [facility] END[facility]COPY;  Service: Gastroenterology;  Laterality: N/A;   ESOPHAGOGASTRODUODENOSCOPY (EGD) WITH PROPOFOL N/A 02/21/2017   Procedure: ESOPHAGOGASTRODUODENOSCOPY (EGD) WITH PROPOFOL;  Surgeon: Midge Minium, MD;  Location[facility];  Service: Gastroenterology;  Laterality: N/A;  Pre-diabetic   excision of neck mass  09/1998   GASTRIC BYPASS  03/12/2011   LAPAROSCOPIC SUPRACERVICAL HYSTERECTOMY  2008   and unilateral salpingoophorectomy/ endometriosis-Dr Kincius   TONSILLECTOMY      SOCIAL HISTORY: Social History   Socioeconomic History   Marital status: Married    Spouse name: Educational psychologist   Number of children: 2   Years of education: Not on file   Highest education level: Associate degree: occupational, Scientist, product/process development, or vocational program  Occupational History   Occupation: house wife   Tobacco Use   Smoking status: Every Day    Current packs/day: 0.00    Average packs/day: 0.5 packs/day for 20.0 years (10.0 ttl pk-yrs)    Types: Cigarettes, E-cigarettes    Start date: 06/11/2000    Last attempt to quit: 06/03/2020    Years since quitting: 2.7   Smokeless tobacco: Never   Tobacco comments:    Only on e-cigarettes   Vaping Use   Vaping status: Some Days  Substance and Sexual Activity   Alcohol use: No    Alcohol/week: 0.0 standard drinks of alcohol     Comment: rarely   Drug use: No   Sexual activity: Yes    Partners: Male    Birth control/protection: Surgical  Other Topics Concern   Not on file  Social History Narrative   Not on file   Social Determinants of Health   Financial Resource Strain: Low Risk  (02/28/2022)   Overall Financial Resource Strain (CARDIA)    Difficulty of Paying Living Expenses: Not hard at all  Food Insecurity: No Food Insecurity (02/28/2022)   Hunger Vital Sign    Worried About Running Out of Food in the Last Year: Never true    Ran Out of Food in the Last Year: Never true  Transportation Needs: No Transportation Needs (02/28/2022)   PRAPARE - Administrator, Civil Service (Medical): No    Lack of Transportation (Non-Medical): No  Physical Activity: Insufficiently Active (02/28/2022)   Exercise Vital Sign    Days of Exercise per  Week: 2 days    Minutes of Exercise per Session: 10 min  Stress: Stress Concern Present (02/28/2022)   [employer] of Occupational Health - Occupational Stress Questionnaire    Feeling of Stress : Rather much  Social Connections: Socially Integrated (02/28/2022)   Social Connection and Isolation Panel [NHANES]    Frequency of Communication with Friends and Family: More than three times a week    Frequency of Social Gatherings with Friends and Family: Once a week    Attends Religious Services: More than 4 times per year    Active Member of [employer] or Organizations: Yes    Attends Engineer, structural: More than 4 times per year    Marital Status: Married  Catering manager Violence: Not At Risk (02/28/2022)   Humiliation, Afraid, Rape, and Kick questionnaire    Fear of Current or Ex-Partner: No    Emotionally Abused: No    Physically Abused: No    Sexually Abused: No    FAMILY HISTORY: Family History  Problem Relation Age of Onset   Cancer Mother 88       colon   Epilepsy Mother    Multiple myeloma Mother    Glaucoma Father    Breast cancer  Maternal Grandmother 32       inflamitory breast ca    ALLERGIES:  is allergic to vicodin [hydrocodone-acetaminophen].  MEDICATIONS:  Current Outpatient Medications  Medication Sig Dispense Refill   amphetamine-dextroamphetamine (ADDERALL XR) 25 MG 24 hr capsule Take 1 capsule by mouth every morning. 30 capsule 0   atorvastatin (LIPITOR) 40 MG tablet Take 1 tablet (40 mg total) by mouth daily. 90 tablet 1   Cholecalciferol (VITAMIN D) 50 MCG (2000 UT) CAPS Take 1 capsule (2,000 Units total) by mouth daily. 30 capsule 0   cyclobenzaprine (FLEXERIL) 5 MG tablet Take 1 tablet (5 mg total) by mouth at bedtime. 90 tablet 1   DULoxetine (CYMBALTA) 60 MG capsule Take 60 mg by mouth daily.     folic acid (FOLVITE) 1 MG tablet Take 1 tablet (1 mg total) by mouth daily. 90 tablet 0   gabapentin (NEURONTIN) 100 MG capsule Take 2 capsules (200 mg total) by mouth at bedtime. 180 capsule 0   levothyroxine (SYNTHROID) 25 MCG tablet Take 1 tablet (25 mcg total) by mouth daily before breakfast. 90 tablet 1   Multiple Vitamins-Minerals (MULTI-VITAMIN GUMMIES) CHEW Chew 2 capsules by mouth daily. 30 tablet 0   Needles & Syringes MISC Inject 1mL monthly for 2 months 2 each 0   omeprazole (PRILOSEC) 40 MG capsule Take 1 capsule (40 mg total) by mouth daily. 90 capsule 1   QUEtiapine (SEROQUEL) 400 MG tablet Take 400 mg by mouth at bedtime.     sulfamethoxazole-trimethoprim (BACTRIM) 400-80 MG tablet Take 1 tablet by mouth 2 (two) times daily. 6 tablet 0   cyanocobalamin (VITAMIN B12) 1000 MCG/ML injection Inject 1 mL (1,000 mcg total) into the muscle once monthly for 2 months 6 mL 0   DULoxetine (CYMBALTA) 30 MG capsule Take 1 capsule (30 mg total) by mouth daily for 7 days, THEN 2 capsules (60 mg total) daily. (Patient not taking: Reported on 03/11/2023) 60 capsule 1   No current facility-administered medications for this visit.    Review of Systems  Constitutional:  Positive for fatigue. Negative for  appetite change, chills and fever.  HENT:   Negative for hearing loss and voice change.   Eyes:  Negative for eye problems.  Respiratory:  Negative for chest tightness and cough.   Cardiovascular:  Negative for chest pain.  Gastrointestinal:  Negative for abdominal distention, abdominal pain and blood in stool.  Endocrine: Negative for hot flashes.  Genitourinary:  Negative for difficulty urinating and frequency.   Musculoskeletal:  Negative for arthralgias.  Skin:  Negative for itching and rash.  Neurological:  Negative for extremity weakness.  Hematological:  Negative for adenopathy.  Psychiatric/Behavioral:  Negative for confusion.    PHYSICAL EXAMINATION: ECOG PERFORMANCE STATUS: 1 - Symptomatic but completely ambulatory Vitals:   03/11/23 0856  BP: 105/73  Pulse: 97  Resp: 18  Temp: 97.8 F (36.6 C)   Filed Weights   03/11/23 0856  Weight: 120 lb 9.6 oz (54.7 kg)    Physical Exam Constitutional:      General: She is not in acute distress. HENT:     Head: Normocephalic and atraumatic.  Eyes:     General: No scleral icterus. Cardiovascular:     Rate and Rhythm: Normal rate and regular rhythm.     Heart sounds: Normal heart sounds.  Pulmonary:     Effort: Pulmonary effort is normal. No respiratory distress.     Breath sounds: No wheezing.  Abdominal:     General: Bowel sounds are normal. There is no distension.     Palpations: Abdomen is soft.  Musculoskeletal:        General: No deformity. Normal range of motion.     Cervical back: Normal range of motion and neck supple.  Skin:    General: Skin is warm and dry.     Findings: No erythema or rash.  Neurological:     Mental Status: She is alert and oriented to person, place, and time. Mental status is at baseline.     Cranial Nerves: No cranial nerve deficit.     Coordination: Coordination normal.  Psychiatric:        Mood and Affect: Mood normal.     LABORATORY DATA:  I have reviewed the data as  listed    Latest Ref Rng & Units 03/11/2023    8:45 AM 10/30/2022    8:40 AM 03/12/2022    4:03 PM  CBC  WBC 4.0 - 10.5 K/uL 7.6  6.5  7.0   Hemoglobin 12.0 - 15.0 g/dL 16.1  09.6  04.5   Hematocrit 36.0 - 46.0 % 37.0  38.1  38.8   Platelets 150 - 400 K/uL 327  336  284       Latest Ref Rng & Units 02/20/2023    8:28 AM 10/30/2022    8:40 AM 12/11/2021   10:58 AM  CMP  Glucose 65 - 99 mg/dL 83  94  88   BUN 7 - 25 mg/dL 11  13  15    Creatinine 0.50 - 1.03 mg/dL 4.09  8.11  9.14   Sodium 135 - 146 mmol/L 138  138  141   Potassium 3.5 - 5.3 mmol/L 3.9  3.6  4.6   Chloride 98 - 110 mmol/L 103  101  105   CO2 20 - 32 mmol/L 24  29  27    Calcium 8.6 - 10.4 mg/dL 9.3  9.1  9.3   Total Protein 6.1 - 8.1 g/dL 7.0  7.3  7.1   Total Bilirubin 0.2 - 1.2 mg/dL 0.6  0.5  0.5   Alkaline Phos 38 - 126 U/L  204    AST 10 - 35 U/L 11  13  14    ALT  6 - 29 U/L 15  9  10        RADIOGRAPHIC STUDIES: I have personally reviewed the radiological images as listed and agreed with the findings in the report. No results found.

## 2023-03-11 NOTE — Assessment & Plan Note (Addendum)
Clinically she has no constitutional symptoms except weight loss which was felt to be due to gastric bypass and medication.  Continue monitor clinically.

## 2023-03-11 NOTE — Telephone Encounter (Signed)
-----   Message from Rickard Patience sent at 03/11/2023 12:39 PM EDT ----- Please arrange her to get Venofer weekly x 2

## 2023-03-21 NOTE — Progress Notes (Signed)
Name: Jessica [name]   MRN: [id number]    DOB: [birth year]   Date:03/22/2023       Progress Note  Subjective  Chief Complaint  Annual Exam  HPI  Patient presents for annual CPE.  Diet: eats small portions and trying to add protein in her diet, not always the best type of food. Likes junk  Exercise:  discussed regular activity  Last Eye Exam: up to date date  Last Dental Exam: up to date   [employer] Visit from 03/22/2023 in [facility]  AUDIT-C Score 0      Depression: Phq 9 is  positive    03/22/2023    8:58 AM 02/20/2023    7:42 AM 11/20/2022    9:35 AM 05/10/2022   10:28 AM 02/28/2022    8:21 AM  Depression screen PHQ 2/9  Decreased Interest 3 3 1 1  0  Down, Depressed, Hopeless 3 3 1 1 1   PHQ - 2 Score 6 6 2 2 1   Altered sleeping 3 3 3 1 3   Tired, decreased energy 3 3 3 1 3   Change in appetite 0 3 0 1 0  Feeling bad or failure about yourself  0 3 0 0 0  Trouble concentrating 3 3 1 1  0  Moving slowly or fidgety/restless 0 0 0 0 0  Suicidal thoughts 0 0 0 0 0  PHQ-9 Score 15 21 9 6 7   Difficult doing work/chores  Extremely dIfficult  Somewhat difficult    Hypertension: BP Readings from Last 3 Encounters:  03/22/23 118/68  03/11/23 105/73  02/20/23 118/68   Obesity: Wt Readings from Last 3 Encounters:  03/22/23 121 lb (54.9 kg)  03/11/23 120 lb 9.6 oz (54.7 kg)  02/20/23 120 lb (54.4 kg)   BMI Readings from Last 3 Encounters:  03/22/23 20.14 kg/m  03/11/23 20.07 kg/m  02/20/23 19.97 kg/m     Vaccines:   HPV: N/A Tdap: up to date Shingrix: refused  Pneumonia: refused  Flu: today  COVID-19: refused    Hep C Screening: 11/23/19 STD testing and prevention (HIV/chl/gon/syphilis): N/A Intimate partner violence: negative screen  Sexual History : she has dyspareunia and only had sex twice in the past 2 years, she saw GYN, tried topical cream but did not improve, does not want to go back to gyn at this time   Menstrual History/LMP/Abnormal Bleeding: post-menopause - supra cervical hysterectomy  Discussed importance of follow up if any post-menopausal bleeding: N/A Incontinence Symptoms: negative for symptoms   Breast cancer:  - Last Mammogram: Ordered 11/20/22 - BRCA gene screening: grandmother had breast cancer, mother had bone cancer  Osteoporosis Prevention : Discussed high calcium and vitamin D supplementation, weight bearing exercises Bone density: N/A   Cervical cancer screening: due- ordered today  Skin cancer: Discussed monitoring for atypical lesions  Colorectal cancer: 11/20/19   Lung cancer:  Low Dose CT Chest recommended if Age 65-80 years, 20 pack-year currently smoking OR have quit w/in 15years. Patient does not qualify for screen   ECG: 10/24/11, discussed repeating it next time   Advanced Care Planning: A voluntary discussion about advance care planning including the explanation and discussion of advance directives.  Discussed health care proxy and Living will, and the patient was able to identify a health care proxy as husband.  Patient does not have a living will and power of attorney of health care    Patient Active Problem List   Diagnosis Date Noted  Senile purpura (HCC) 11/20/2022   Folate deficiency 10/30/2022   Weight loss 10/30/2022   ASCUS with positive high risk HPV cervical 05/06/2022   Tubular adenoma of colon 05/06/2022   Iron deficiency anemia 01/09/2018   Cervical high risk HPV (human papillomavirus) test positive 04/08/2017   History of gastric bypass    Ulcer jejunum    Adenoma of left adrenal gland 02/18/2017   Abnormal TSH 08/30/2016   Bipolar disorder (HCC) 12/02/2015   HAV (hallux abducto valgus) 11/22/2015   Bunion of great toe 11/08/2015   Attention-deficit hyperactivity disorder, other type 10/28/2015   Sciatica 04/22/2015   B12 deficiency 12/01/2014   History of bariatric surgery 12/01/2014   Anxiety, generalized 12/01/2014    Gastro-esophageal reflux disease without esophagitis 12/01/2014   H/O elevated lipids 12/01/2014   H/O: HTN (hypertension) 12/01/2014   H/O: obesity 12/01/2014   Hodgkin's disease in remission 12/01/2014   Current tobacco use 12/01/2014   Insomnia 03/24/2008   Abnormal serum level of alkaline phosphatase 11/08/2006   Chronic recurrent major depressive disorder (HCC) 11/08/2006    Past Surgical History:  Procedure Laterality Date   ABDOMINAL HYSTERECTOMY     BUNIONECTOMY Right    CARPAL TUNNEL RELEASE     COLONOSCOPY     multiple   COLONOSCOPY WITH PROPOFOL N/A 11/20/2019   Procedure: COLONOSCOPY WITH PROPOFOL;  Surgeon: Toney Reil, MD;  Location: [facility];  Service: Gastroenterology;  Laterality: N/A;   ESOPHAGOGASTRODUODENOSCOPY (EGD) WITH PROPOFOL N/A 02/21/2017   Procedure: ESOPHAGOGASTRODUODENOSCOPY (EGD) WITH PROPOFOL;  Surgeon: Midge Minium, MD;  Location: [facility];  Service: Gastroenterology;  Laterality: N/A;  Pre-diabetic   excision of neck mass  09/1998   GASTRIC BYPASS  03/12/2011   LAPAROSCOPIC SUPRACERVICAL HYSTERECTOMY  2008   and unilateral salpingoophorectomy/ endometriosis-Dr Kincius   TONSILLECTOMY      Family History  Problem Relation Age of Onset   Cancer Mother 58       colon   Epilepsy Mother    Multiple myeloma Mother    Glaucoma Father    Breast cancer Maternal Grandmother 87       inflamitory breast ca    Social History   Socioeconomic History   Marital status: Married    Spouse name: Educational psychologist   Number of children: 2   Years of education: Not on file   Highest education level: Associate degree: occupational, Scientist, product/process development, or vocational program  Occupational History   Occupation: house wife   Tobacco Use   Smoking status: Every Day    Current packs/day: 0.00    Average packs/day: 0.5 packs/day for 20.0 years (10.0 ttl pk-yrs)    Types: Cigarettes, E-cigarettes    Start date: 06/11/2000    Last attempt to quit: 06/03/2020     Years since quitting: 2.8   Smokeless tobacco: Never   Tobacco comments:    Only on e-cigarettes   Vaping Use   Vaping status: Some Days  Substance and Sexual Activity   Alcohol use: No    Alcohol/week: 0.0 standard drinks of alcohol    Comment: rarely   Drug use: No   Sexual activity: Yes    Partners: Male    Birth control/protection: Surgical  Other Topics Concern   Not on file  Social History Narrative   Not on file   Social Determinants of Health   Financial Resource Strain: Low Risk  (03/22/2023)   Overall Financial Resource Strain (CARDIA)    Difficulty of Paying Living Expenses: Not hard  at all  Food Insecurity: No Food Insecurity (03/22/2023)   Hunger Vital Sign    Worried About Running Out of Food in the Last Year: Never true    Ran Out of Food in the Last Year: Never true  Transportation Needs: No Transportation Needs (03/22/2023)   PRAPARE - Administrator, Civil Service (Medical): No    Lack of Transportation (Non-Medical): No  Physical Activity: Insufficiently Active (03/22/2023)   Exercise Vital Sign    Days of Exercise per Week: 2 days    Minutes of Exercise per Session: 30 min  Stress: No Stress Concern Present (03/22/2023)   [employer] of Occupational Health - Occupational Stress Questionnaire    Feeling of Stress : Not at all  Social Connections: Moderately Integrated (03/22/2023)   Social Connection and Isolation Panel [NHANES]    Frequency of Communication with Friends and Family: More than three times a week    Frequency of Social Gatherings with Friends and Family: More than three times a week    Attends Religious Services: More than 4 times per year    Active Member of [employer] or Organizations: No    Attends Banker Meetings: Never    Marital Status: Married  Catering manager Violence: Not At Risk (03/22/2023)   Humiliation, Afraid, Rape, and Kick questionnaire    Fear of Current or Ex-Partner: No    Emotionally  Abused: No    Physically Abused: No    Sexually Abused: No     Current Outpatient Medications:    amphetamine-dextroamphetamine (ADDERALL XR) 25 MG 24 hr capsule, Take 1 capsule by mouth every morning., Disp: 30 capsule, Rfl: 0   atorvastatin (LIPITOR) 40 MG tablet, Take 1 tablet (40 mg total) by mouth daily., Disp: 90 tablet, Rfl: 1   Cholecalciferol (VITAMIN D) 50 MCG (2000 UT) CAPS, Take 1 capsule (2,000 Units total) by mouth daily., Disp: 30 capsule, Rfl: 0   cyanocobalamin (VITAMIN B12) 1000 MCG/ML injection, Inject 1 mL (1,000 mcg total) into the muscle once monthly for 2 months, Disp: 6 mL, Rfl: 0   cyclobenzaprine (FLEXERIL) 5 MG tablet, Take 1 tablet (5 mg total) by mouth at bedtime., Disp: 90 tablet, Rfl: 1   DULoxetine (CYMBALTA) 30 MG capsule, Take 1 capsule (30 mg total) by mouth daily for 7 days, THEN 2 capsules (60 mg total) daily., Disp: 60 capsule, Rfl: 1   DULoxetine (CYMBALTA) 60 MG capsule, Take 60 mg by mouth daily., Disp: , Rfl:    folic acid (FOLVITE) 1 MG tablet, Take 1 tablet (1 mg total) by mouth daily., Disp: 90 tablet, Rfl: 0   gabapentin (NEURONTIN) 100 MG capsule, Take 2 capsules (200 mg total) by mouth at bedtime., Disp: 180 capsule, Rfl: 0   levothyroxine (SYNTHROID) 25 MCG tablet, Take 1 tablet (25 mcg total) by mouth daily before breakfast., Disp: 90 tablet, Rfl: 1   Multiple Vitamins-Minerals (MULTI-VITAMIN GUMMIES) CHEW, Chew 2 capsules by mouth daily., Disp: 30 tablet, Rfl: 0   Needles & Syringes MISC, Inject 1mL monthly for 2 months, Disp: 2 each, Rfl: 0   omeprazole (PRILOSEC) 40 MG capsule, Take 1 capsule (40 mg total) by mouth daily., Disp: 90 capsule, Rfl: 1   QUEtiapine (SEROQUEL) 400 MG tablet, Take 400 mg by mouth at bedtime., Disp: , Rfl:    sulfamethoxazole-trimethoprim (BACTRIM) 400-80 MG tablet, Take 1 tablet by mouth 2 (two) times daily., Disp: 6 tablet, Rfl: 0  Allergies  Allergen Reactions   Vicodin [Hydrocodone-Acetaminophen]  Other (See  Comments)    Has really bad headaches/ migraine       ROS  Constitutional: Negative for fever or weight change.  Respiratory: Negative for cough and shortness of breath.   Cardiovascular: Negative for chest pain or palpitations.  Gastrointestinal: Negative for abdominal pain, no bowel changes.  Musculoskeletal: Negative for gait problem or joint swelling.  Skin: Negative for rash.  Neurological: Negative for dizziness or headache.  No other specific complaints in a complete review of systems (except as listed in HPI above).   Objective  Vitals:   03/22/23 0859  BP: 118/68  Pulse: (!) 102  Resp: 16  SpO2: 98%  Weight: 121 lb (54.9 kg)  Height: 5\' 5"  (1.651 m)    Body mass index is 20.14 kg/m.  Physical Exam  Constitutional: Patient appears malnourished. No distress.  HENT: Head: Normocephalic and atraumatic. Ears: B TMs ok, no erythema or effusion; Nose: Nose normal. Mouth/Throat: Oropharynx is clear and moist. No oropharyngeal exudate.  Eyes: Conjunctivae and EOM are normal. Pupils are equal, round, and reactive to light. No scleral icterus.  Neck: Normal range of motion. Neck supple. No JVD present. No thyromegaly present.  Cardiovascular: Normal rate, regular rhythm and normal heart sounds.  No murmur heard. No BLE edema. Pulmonary/Chest: Effort normal and breath sounds normal. No respiratory distress. Abdominal: Soft. Bowel sounds are normal, no distension. There is no tenderness. no masses Breast: no lumps or masses, no nipple discharge or rashes FEMALE GENITALIA:  External genitalia normal External urethra normal Vaginal vault normal without discharge or lesions Cervix normal without discharge or lesions Bimanual exam normal without masses RECTAL: not done  Musculoskeletal: Normal range of motion, no joint effusions. No gross deformities Neurological: he is alert and oriented to person, place, and time. No cranial nerve deficit. Coordination, balance, strength,  speech and gait are normal.  Skin: Skin is warm and dry. No rash noted. No erythema.  Psychiatric: Patient has a normal mood and affect. behavior is normal. Judgment and thought content normal.     Fall Risk:    03/22/2023    8:58 AM 02/20/2023    7:42 AM 11/20/2022    9:35 AM 05/10/2022   10:28 AM 03/05/2022    3:40 PM  Fall Risk   Falls in the past year? 0 1 1 0 0  Number falls in past yr: 0 0 1 0 0  Injury with Fall? 0 1 0 0 0  Risk for fall due to : No Fall Risks No Fall Risks Impaired balance/gait No Fall Risks No Fall Risks  Follow up Falls prevention discussed Falls prevention discussed Falls prevention discussed Falls prevention discussed;Education provided;Falls evaluation completed Falls prevention discussed     Functional Status Survey: Is the patient deaf or have difficulty hearing?: No Does the patient have difficulty seeing, even when wearing glasses/contacts?: No Does the patient have difficulty concentrating, remembering, or making decisions?: No Does the patient have difficulty walking or climbing stairs?: No Does the patient have difficulty dressing or bathing?: No Does the patient have difficulty doing errands alone such as visiting a doctor's office or shopping?: No   Assessment & Plan  1. Well adult exam   2. Cervical cancer screening  - Cytology - PAP  3. Abnormal urine odor  - CULTURE, URINE COMPREHENSIVE  Still present even though treated last month for UTI  4. Colon cancer screening  - Ambulatory referral to Gastroenterology  5. Needs flu shot  She will return for  that    6. Hot flashes due to menopause   She will ask Dr. Cathie Hoops about risk of getting hormone replacement therapy due to family history of breast cancer  -USPSTF grade A and B recommendations reviewed with patient; age-appropriate recommendations, preventive care, screening tests, etc discussed and encouraged; healthy living encouraged; see AVS for patient education given to  patient -Discussed importance of 150 minutes of physical activity weekly, eat two servings of fish weekly, eat one serving of tree nuts ( cashews, pistachios, pecans, almonds.Marland Kitchen) every other day, eat 6 servings of fruit/vegetables daily and drink plenty of water and avoid sweet beverages.   -Reviewed Health Maintenance: Yes.

## 2023-03-22 ENCOUNTER — Encounter: Payer: Self-pay | Admitting: Oncology

## 2023-03-22 ENCOUNTER — Other Ambulatory Visit (HOSPITAL_COMMUNITY)
Admission: RE | Admit: 2023-03-22 | Discharge: 2023-03-22 | Disposition: A | Discharge status: Home / Self Care | Payer: Commercial Managed Care - HMO | Source: Ambulatory Visit | Attending: Family Medicine | Admitting: Family Medicine

## 2023-03-22 ENCOUNTER — Encounter: Payer: Self-pay | Admitting: Family Medicine

## 2023-03-22 ENCOUNTER — Ambulatory Visit (INDEPENDENT_AMBULATORY_CARE_PROVIDER_SITE_OTHER): Payer: Managed Care, Other (non HMO) | Admitting: Family Medicine

## 2023-03-22 VITALS — BP 118/68 | HR 102 | Resp 16 | Ht 65.0 in | Wt 121.0 lb

## 2023-03-22 DIAGNOSIS — Z0000 Encounter for general adult medical examination without abnormal findings: Secondary | ICD-10-CM | POA: Diagnosis not present

## 2023-03-22 DIAGNOSIS — Z1211 Encounter for screening for malignant neoplasm of colon: Secondary | ICD-10-CM

## 2023-03-22 DIAGNOSIS — Z124 Encounter for screening for malignant neoplasm of cervix: Secondary | ICD-10-CM | POA: Diagnosis present

## 2023-03-22 DIAGNOSIS — R8290 Unspecified abnormal findings in urine: Secondary | ICD-10-CM | POA: Diagnosis not present

## 2023-03-22 DIAGNOSIS — Z23 Encounter for immunization: Secondary | ICD-10-CM

## 2023-03-22 DIAGNOSIS — N951 Menopausal and female climacteric states: Secondary | ICD-10-CM

## 2023-03-25 ENCOUNTER — Other Ambulatory Visit: Payer: Self-pay | Admitting: Family Medicine

## 2023-03-25 LAB — CYTOLOGY - PAP
Comment: NEGATIVE
Diagnosis: UNDETERMINED — AB
High risk HPV: POSITIVE — AB

## 2023-03-26 ENCOUNTER — Other Ambulatory Visit: Payer: Self-pay | Admitting: Family Medicine

## 2023-03-26 LAB — CULTURE, URINE COMPREHENSIVE
MICRO NUMBER:: 15585332
SPECIMEN QUALITY:: ADEQUATE

## 2023-03-26 MED ORDER — SULFAMETHOXAZOLE-TRIMETHOPRIM 800-160 MG PO TABS: 1.0000 | ORAL_TABLET | Freq: Two times a day (BID) | ORAL | 0 refills | Status: DC

## 2023-03-27 ENCOUNTER — Encounter: Payer: Self-pay | Admitting: Oncology

## 2023-03-28 ENCOUNTER — Other Ambulatory Visit: Payer: Self-pay

## 2023-03-28 MED ORDER — AMPHETAMINE-DEXTROAMPHET ER 25 MG PO CP24
25.0000 mg | ORAL_CAPSULE | Freq: Every morning | ORAL | 0 refills | Status: DC
  Filled 2023-03-28 – 2023-07-24 (×2): qty 30, 30d supply, fill #0

## 2023-04-01 ENCOUNTER — Encounter: Payer: Self-pay | Admitting: *Deleted

## 2023-04-08 ENCOUNTER — Other Ambulatory Visit: Payer: Self-pay

## 2023-05-25 ENCOUNTER — Encounter: Payer: Self-pay | Admitting: Oncology

## 2023-05-25 ENCOUNTER — Other Ambulatory Visit: Payer: Self-pay | Admitting: Family Medicine

## 2023-05-25 DIAGNOSIS — I700 Atherosclerosis of aorta: Secondary | ICD-10-CM

## 2023-05-27 NOTE — Telephone Encounter (Signed)
Last follow up aptt on 02/2023, has a future follow up appt till 08/2023

## 2023-07-19 ENCOUNTER — Observation Stay
Admission: EM | Admit: 2023-07-19 | Discharge: 2023-07-19 | Discharge status: Against Medical Advice | Payer: Commercial Managed Care - HMO | Attending: Internal Medicine | Admitting: Internal Medicine

## 2023-07-19 ENCOUNTER — Emergency Department: Payer: Commercial Managed Care - HMO

## 2023-07-19 ENCOUNTER — Other Ambulatory Visit: Payer: Self-pay

## 2023-07-19 DIAGNOSIS — I4581 Long QT syndrome: Secondary | ICD-10-CM | POA: Insufficient documentation

## 2023-07-19 DIAGNOSIS — F909 Attention-deficit hyperactivity disorder, unspecified type: Secondary | ICD-10-CM | POA: Insufficient documentation

## 2023-07-19 DIAGNOSIS — Z9884 Bariatric surgery status: Secondary | ICD-10-CM

## 2023-07-19 DIAGNOSIS — R8279 Other abnormal findings on microbiological examination of urine: Secondary | ICD-10-CM | POA: Insufficient documentation

## 2023-07-19 DIAGNOSIS — M791 Myalgia, unspecified site: Secondary | ICD-10-CM | POA: Diagnosis not present

## 2023-07-19 DIAGNOSIS — Z79899 Other long term (current) drug therapy: Secondary | ICD-10-CM | POA: Insufficient documentation

## 2023-07-19 DIAGNOSIS — R9431 Abnormal electrocardiogram [ECG] [EKG]: Secondary | ICD-10-CM

## 2023-07-19 DIAGNOSIS — F319 Bipolar disorder, unspecified: Secondary | ICD-10-CM | POA: Diagnosis present

## 2023-07-19 DIAGNOSIS — R5383 Other fatigue: Secondary | ICD-10-CM | POA: Insufficient documentation

## 2023-07-19 DIAGNOSIS — Z1152 Encounter for screening for COVID-19: Secondary | ICD-10-CM | POA: Diagnosis not present

## 2023-07-19 DIAGNOSIS — F17210 Nicotine dependence, cigarettes, uncomplicated: Secondary | ICD-10-CM | POA: Diagnosis not present

## 2023-07-19 DIAGNOSIS — R531 Weakness: Secondary | ICD-10-CM

## 2023-07-19 DIAGNOSIS — R4182 Altered mental status, unspecified: Principal | ICD-10-CM | POA: Insufficient documentation

## 2023-07-19 DIAGNOSIS — E538 Deficiency of other specified B group vitamins: Secondary | ICD-10-CM | POA: Diagnosis not present

## 2023-07-19 DIAGNOSIS — G9341 Metabolic encephalopathy: Secondary | ICD-10-CM | POA: Diagnosis not present

## 2023-07-19 DIAGNOSIS — E876 Hypokalemia: Secondary | ICD-10-CM | POA: Insufficient documentation

## 2023-07-19 DIAGNOSIS — E860 Dehydration: Secondary | ICD-10-CM | POA: Insufficient documentation

## 2023-07-19 DIAGNOSIS — N390 Urinary tract infection, site not specified: Secondary | ICD-10-CM | POA: Insufficient documentation

## 2023-07-19 DIAGNOSIS — E039 Hypothyroidism, unspecified: Secondary | ICD-10-CM | POA: Insufficient documentation

## 2023-07-19 LAB — BASIC METABOLIC PANEL
Anion gap: 10 (ref 5–15)
BUN: 22 mg/dL — ABNORMAL HIGH (ref 6–20)
CO2: 29 mmol/L (ref 22–32)
Calcium: 9.2 mg/dL (ref 8.9–10.3)
Chloride: 96 mmol/L — ABNORMAL LOW (ref 98–111)
Creatinine, Ser: 0.75 mg/dL (ref 0.44–1.00)
GFR, Estimated: >60 mL/min (ref >60)
Glucose, Bld: 138 mg/dL — ABNORMAL HIGH (ref 70–99)
Potassium: 2.6 mmol/L — CL (ref 3.5–5.1)
Sodium: 135 mmol/L (ref 135–145)

## 2023-07-19 LAB — CBC WITH DIFFERENTIAL/PLATELET
Abs Immature Granulocytes: 0.02 10*3/uL (ref 0.00–0.07)
Basophils Absolute: 0 10*3/uL (ref 0.0–0.1)
Basophils Relative: 0 %
Eosinophils Absolute: 0.1 10*3/uL (ref 0.0–0.5)
Eosinophils Relative: 1 %
HCT: 38.9 % (ref 36.0–46.0)
Hemoglobin: 12.5 g/dL (ref 12.0–15.0)
Immature Granulocytes: 0 %
Lymphocytes Relative: 32 %
Lymphs Abs: 2.5 10*3/uL (ref 0.7–4.0)
MCH: 28.4 pg (ref 26.0–34.0)
MCHC: 32.1 g/dL (ref 30.0–36.0)
MCV: 88.4 fL (ref 80.0–100.0)
Monocytes Absolute: 0.8 10*3/uL (ref 0.1–1.0)
Monocytes Relative: 10 %
Neutro Abs: 4.4 10*3/uL (ref 1.7–7.7)
Neutrophils Relative %: 57 %
Platelets: 389 10*3/uL (ref 150–400)
RBC: 4.4 MIL/uL (ref 3.87–5.11)
RDW: 13.6 % (ref 11.5–15.5)
WBC: 7.8 10*3/uL (ref 4.0–10.5)
nRBC: 0 % (ref 0.0–0.2)

## 2023-07-19 LAB — URINALYSIS, ROUTINE W REFLEX MICROSCOPIC
Bilirubin Urine: NEGATIVE
Glucose, UA: NEGATIVE mg/dL
Hgb urine dipstick: NEGATIVE
Ketones, ur: NEGATIVE mg/dL
Leukocytes,Ua: NEGATIVE
Nitrite: NEGATIVE
Protein, ur: 100 mg/dL — AB
Specific Gravity, Urine: 1.021 (ref 1.005–1.030)
pH: 5 (ref 5.0–8.0)

## 2023-07-19 LAB — HEPATIC FUNCTION PANEL
ALT: 36 U/L (ref 0–44)
AST: 18 U/L (ref 15–41)
Albumin: 3.6 g/dL (ref 3.5–5.0)
Alkaline Phosphatase: 225 U/L — ABNORMAL HIGH (ref 38–126)
Bilirubin, Direct: 0.2 mg/dL (ref 0.0–0.2)
Indirect Bilirubin: 0.5 mg/dL (ref 0.3–0.9)
Total Bilirubin: 0.7 mg/dL (ref 0.0–1.2)
Total Protein: 7.8 g/dL (ref 6.5–8.1)

## 2023-07-19 LAB — RESP PANEL BY RT-PCR (RSV, FLU A&B, COVID)  RVPGX2
Influenza A by PCR: NEGATIVE
Influenza B by PCR: NEGATIVE
Resp Syncytial Virus by PCR: NEGATIVE
SARS Coronavirus 2 by RT PCR: NEGATIVE

## 2023-07-19 LAB — LACTIC ACID, PLASMA: Lactic Acid, Venous: 2 mmol/L (ref 0.5–1.9)

## 2023-07-19 LAB — HEMOGLOBIN A1C
Hgb A1c MFr Bld: 5.4 % (ref 4.8–5.6)
Mean Plasma Glucose: 108.28 mg/dL

## 2023-07-19 LAB — MAGNESIUM: Magnesium: 2 mg/dL (ref 1.7–2.4)

## 2023-07-19 LAB — TROPONIN I (HIGH SENSITIVITY)
Troponin I (High Sensitivity): 5 ng/L (ref <18)
Troponin I (High Sensitivity): 5 ng/L (ref <18)

## 2023-07-19 LAB — T4, FREE: Free T4: 1.05 ng/dL (ref 0.61–1.12)

## 2023-07-19 LAB — TSH: TSH: 2.663 u[IU]/mL (ref 0.350–4.500)

## 2023-07-19 LAB — CK: Total CK: 27 U/L — ABNORMAL LOW (ref 38–234)

## 2023-07-19 MED ORDER — POTASSIUM CHLORIDE CRYS ER 20 MEQ PO TBCR: 40.0000 meq | EXTENDED_RELEASE_TABLET | Freq: Once | ORAL | Status: DC

## 2023-07-19 MED ORDER — POTASSIUM CHLORIDE 10 MEQ/100ML IV SOLN
10.0000 meq | INTRAVENOUS | Status: AC
  Administered 2023-07-19 (×4): 10 meq via INTRAVENOUS
  Filled 2023-07-19 (×3): qty 100

## 2023-07-19 MED ORDER — ADULT MULTIVITAMIN W/MINERALS CH: 1.0000 | ORAL_TABLET | Freq: Every day | ORAL | Status: DC

## 2023-07-19 MED ORDER — SODIUM CHLORIDE 0.9 % IV SOLN
1.0000 g | Freq: Once | INTRAVENOUS | Status: AC
  Administered 2023-07-19: 1 g via INTRAVENOUS
  Filled 2023-07-19: qty 10

## 2023-07-19 MED ORDER — PANTOPRAZOLE SODIUM 40 MG PO TBEC: 40.0000 mg | DELAYED_RELEASE_TABLET | Freq: Every day | ORAL | Status: DC

## 2023-07-19 MED ORDER — FOLIC ACID 1 MG PO TABS: 1.0000 mg | ORAL_TABLET | Freq: Every day | ORAL | Status: DC

## 2023-07-19 MED ORDER — LEVOTHYROXINE SODIUM 50 MCG PO TABS: 25.0000 ug | ORAL_TABLET | Freq: Every day | ORAL | Status: DC

## 2023-07-19 MED ORDER — HYDRALAZINE HCL 20 MG/ML IJ SOLN: 5.0000 mg | Freq: Four times a day (QID) | INTRAMUSCULAR | Status: DC | PRN

## 2023-07-19 MED ORDER — ACETAMINOPHEN 325 MG PO TABS
650.0000 mg | ORAL_TABLET | Freq: Once | ORAL | Status: AC
  Administered 2023-07-19: 650 mg via ORAL
  Filled 2023-07-19: qty 2

## 2023-07-19 MED ORDER — ENOXAPARIN SODIUM 40 MG/0.4ML IJ SOSY: 40.0000 mg | PREFILLED_SYRINGE | INTRAMUSCULAR | Status: DC

## 2023-07-19 MED ORDER — SODIUM CHLORIDE 0.9 % IV SOLN: 1.0000 g | INTRAVENOUS | Status: DC

## 2023-07-19 MED ORDER — POTASSIUM CHLORIDE 20 MEQ PO PACK
40.0000 meq | PACK | Freq: Once | ORAL | Status: AC
  Administered 2023-07-19: 40 meq via ORAL
  Filled 2023-07-19: qty 2

## 2023-07-19 MED ORDER — VITAMIN D 25 MCG (1000 UNIT) PO TABS: 2000.0000 [IU] | ORAL_TABLET | Freq: Every day | ORAL | Status: DC

## 2023-07-19 MED ORDER — BISACODYL 10 MG RE SUPP: 10.0000 mg | Freq: Every day | RECTAL | Status: DC | PRN

## 2023-07-19 MED ORDER — SODIUM CHLORIDE 0.9 % IV BOLUS
1000.0000 mL | Freq: Once | INTRAVENOUS | Status: AC
  Administered 2023-07-19: 1000 mL via INTRAVENOUS

## 2023-07-19 MED ORDER — LORAZEPAM 2 MG/ML IJ SOLN: 0.5000 mg | Freq: Four times a day (QID) | INTRAMUSCULAR | Status: DC | PRN

## 2023-07-19 NOTE — H&P (Signed)
 History and Physical    Patient: Jessica [name] DOB: [birth year] DOA: 07/19/2023 DOS: the patient was seen and examined on 07/19/2023 PCP: Glenard Mire, MD  Patient coming from: Home  Chief Complaint:  Chief Complaint  Patient presents with   Altered Mental Status   Fall   HPI: Jessica SCHEXNIDER is a 58 y.o. female with medical history significant of ADHD, anxiety disorder, endometriosis, history of Hodgkin's lymphoma, gastric bypass, hypothyroidism presented to the emergency department for evaluation of altered mental status, generalized weakness.  Patient states that she has been feeling sick since Sunday.  Patient is more lethargic, weak, diffuse myalgia and has been able dysuria.  Patient denies fever, chills or rigors.  No sore throat, cough.  Denies fall.  No abdominal pain, nausea or vomiting.  In the emergency department patient's vitals are stable, afebrile, able to follow commands.  Did show potassium 2.6.  EKG showed prolonged QTc, sinus tachycardia.  Patient is given IV Rocephin , IV potassium supplements,  IV fluid bolus, hospitalist consulted for further management evaluation.  Review of Systems: As mentioned in the history of present illness. All other systems reviewed and are negative. Past Medical History:  Diagnosis Date   ADHD (attention deficit hyperactivity disorder)    Anxiety    Bipolar 1 disorder (HCC)    Cancer (HCC)    Hodgkin's Lymphoma 09/1998   Depression    Endometriosis    GERD (gastroesophageal reflux disease)    History of dysplastic nevus 05/04/2020   right abdomen (side) upper/severe   History of gastric bypass 2012   Hodgkin's lymphoma (HCC) 2000   Hypothyroidism    Pre-diabetes    Tobacco use disorder    Ulcer jejunum    Past Surgical History:  Procedure Laterality Date   ABDOMINAL HYSTERECTOMY     BUNIONECTOMY Right    CARPAL TUNNEL RELEASE     COLONOSCOPY     multiple   COLONOSCOPY WITH PROPOFOL  N/A 11/20/2019    Procedure: COLONOSCOPY WITH PROPOFOL ;  Surgeon: Unk Corinn Skiff, MD;  Location: [facility];  Service: Gastroenterology;  Laterality: N/A;   ESOPHAGOGASTRODUODENOSCOPY (EGD) WITH PROPOFOL  N/A 02/21/2017   Procedure: ESOPHAGOGASTRODUODENOSCOPY (EGD) WITH PROPOFOL ;  Surgeon: Jinny Carmine, MD;  Location: [facility];  Service: Gastroenterology;  Laterality: N/A;  Pre-diabetic   excision of neck mass  09/1998   GASTRIC BYPASS  03/12/2011   LAPAROSCOPIC SUPRACERVICAL HYSTERECTOMY  2008   and unilateral salpingoophorectomy/ endometriosis-Dr Kincius   TONSILLECTOMY     Social History:  reports that she has been smoking cigarettes and e-cigarettes. She started smoking about 23 years ago. She has a 10 pack-year smoking history. She has never used smokeless tobacco. She reports that she does not drink alcohol and does not use drugs.  Allergies  Allergen Reactions   Vicodin [Hydrocodone -Acetaminophen ] Other (See Comments)    Has really bad headaches/ migraine      Family History  Problem Relation Age of Onset   Cancer Mother 53       colon   Epilepsy Mother    Multiple myeloma Mother    Glaucoma Father    Breast cancer Maternal Grandmother 54       inflamitory breast ca    Prior to Admission medications   Medication Sig Start Date End Date Taking? Authorizing Provider  amphetamine -dextroamphetamine  (ADDERALL XR) 25 MG 24 hr capsule Take 1 capsule by mouth every morning. 03/28/23     atorvastatin  (LIPITOR) 40 MG tablet TAKE 1 TABLET BY MOUTH  EVERY DAY 05/27/23   Sowles, Krichna, MD  Cholecalciferol (VITAMIN D ) 50 MCG (2000 UT) CAPS Take 1 capsule (2,000 Units total) by mouth daily. 01/03/22   Sowles, Krichna, MD  cyanocobalamin  (VITAMIN B12) 1000 MCG/ML injection Inject 1 mL (1,000 mcg total) into the muscle once monthly for 2 months 03/11/23   Babara Call, MD  cyclobenzaprine  (FLEXERIL ) 5 MG tablet Take 1 tablet (5 mg total) by mouth at bedtime. 02/20/23   Sowles, Krichna, MD   DULoxetine  (CYMBALTA ) 60 MG capsule Take 60 mg by mouth daily.    [provider]  folic acid  (FOLVITE ) 1 MG tablet Take 1 tablet (1 mg total) by mouth daily. 02/20/23   Sowles, Krichna, MD  gabapentin  (NEURONTIN ) 100 MG capsule Take 2 capsules (200 mg total) by mouth at bedtime. 11/20/22   Sowles, Krichna, MD  levothyroxine  (SYNTHROID ) 25 MCG tablet Take 1 tablet (25 mcg total) by mouth daily before breakfast. 05/15/22 05/15/23  Sowles, Krichna, MD  Multiple Vitamins-Minerals (MULTI-VITAMIN GUMMIES) CHEW Chew 2 capsules by mouth daily. 01/22/17   Sowles, Krichna, MD  Needles & Syringes MISC Inject 1mL monthly for 2 months 12/04/22   Babara Call, MD  omeprazole  (PRILOSEC) 40 MG capsule Take 1 capsule (40 mg total) by mouth daily. 02/20/23   Sowles, Krichna, MD  QUEtiapine  (SEROQUEL ) 400 MG tablet Take 400 mg by mouth at bedtime.    [provider]  sulfamethoxazole -trimethoprim  (BACTRIM  DS) 800-160 MG tablet Take 1 tablet by mouth 2 (two) times daily. 03/26/23   Sowles, Krichna, MD    Physical Exam: Vitals:   07/19/23 0554 07/19/23 0558 07/19/23 0630 07/19/23 0752  BP:   109/74   Pulse: (!) 106  (!) 103   Temp:  98.1 F (36.7 C)    TempSrc:  Oral    SpO2: 100%  100%   Weight:    54.9 kg  Height:    5' 5 (1.651 m)     General - Middle aged thin built Caucasian female, no apparent distress HEENT - PERRLA, EOMI, atraumatic head, non tender sinuses. Lung - Clear, rales, rhonchi, wheezes. Heart - S1, S2 heard, no murmurs, rubs, no pedal edema. Abdomen - soft, epigastric tenderness, no guarding. Bowel sounds good. Neuro - Alert, awake and oriented x 3, non focal exam. Skin - Warm and dry. Data Reviewed:     Latest Ref Rng & Units 07/19/2023    5:47 AM 03/11/2023    8:45 AM 10/30/2022    8:40 AM  CBC  WBC 4.0 - 10.5 K/uL 7.8  7.6  6.5   Hemoglobin 12.0 - 15.0 g/dL 87.4  88.2  87.5   Hematocrit 36.0 - 46.0 % 38.9  37.0  38.1   Platelets 150 - 400 K/uL 389  327  336        Latest Ref Rng & Units 07/19/2023    5:47 AM 02/20/2023    8:28 AM 10/30/2022    8:40 AM  BMP  Glucose 70 - 99 mg/dL 861  83  94   BUN 6 - 20 mg/dL 22  11  13    Creatinine 0.44 - 1.00 mg/dL 9.24  9.13  9.18   BUN/Creat Ratio 6 - 22 (calc)  SEE NOTE:    Sodium 135 - 145 mmol/L 135  138  138   Potassium 3.5 - 5.1 mmol/L 2.6  3.9  3.6   Chloride 98 - 111 mmol/L 96  103  101   CO2 22 - 32 mmol/L 29  24  29   Calcium  8.9 - 10.3 mg/dL 9.2  9.3  9.1    DG Chest Port 1 View Result Date: 07/19/2023 CLINICAL DATA:  58 year old female status post unwitnessed fall, found down. Weakness. EXAM: PORTABLE CHEST 1 VIEW COMPARISON:  Chest radiographs 01/11/2018 and earlier. FINDINGS: Seated AP portable view at 0624 hours. Lung volumes and mediastinal contours remain normal. Visualized tracheal air column is within normal limits. Allowing for portable technique the lungs are clear. No pneumothorax or pleural effusion. Paucity of bowel gas in the upper abdomen. No acute osseous abnormality identified. IMPRESSION: Negative portable chest. Electronically Signed   By: VEAR Hurst M.D.   On: 07/19/2023 06:34   CT Head Wo Contrast Result Date: 07/19/2023 CLINICAL DATA:  Head trauma with mental status changes. Clemens this morning and could not get up. EXAM: CT HEAD WITHOUT CONTRAST TECHNIQUE: Contiguous axial images were obtained from the base of the skull through the vertex without intravenous contrast. RADIATION DOSE REDUCTION: This exam was performed according to the departmental dose-optimization program which includes automated exposure control, adjustment of the mA and/or kV according to patient size and/or use of iterative reconstruction technique. COMPARISON:  None Available. FINDINGS: Brain: No evidence of acute infarction, hemorrhage, hydrocephalus, extra-axial collection or mass lesion/mass effect. There is mild age-related cerebral cortical volume loss without significant small-vessel changes in the white matter. No  midline shift. Basal cisterns are clear. Vascular: No hyperdense vessel or unexpected calcification. Skull: Negative for fractures or focal lesions. No visible scalp hematoma. Sinuses/Orbits: No acute finding. Other: None. IMPRESSION: No acute intracranial CT findings or depressed skull fractures. Mild age-related cortical volume loss. Electronically Signed   By: Francis Quam M.D.   On: 07/19/2023 06:32     Assessment and Plan: Jessica [name] is a 58 y.o. female with medical history significant of ADHD, anxiety disorder, endometriosis, history of Hodgkin's lymphoma, gastric bypass, hypothyroidism presented to the emergency department for evaluation of altered mental status, generalized weakness.  admitted for further management evaluation. Patient is found to have low potassium, possible UTI  Plan: Hypokalemia: Admit the patient to medicine, telemetry service under observation. Continue oral and IV potassium supplementation. Monitor daily electrolytes.  Generalized weakness: Lethargy: At risk for polypharmacy- Will hold off for antipsychotic medications, lithium , Seroquel , gabapentin , Cymbalta , Flexeril , trazodone for now. Check lithium  level. Continue neurochecks. Fall precautions.  Possible UTI: Follow urine cultures and sensitivities. Will continue IV Rocephin  therapy.  Patient hide urine cultures Continue multivitamins, vit d and folate. She has epigastric discomfort. No acute abdomen. Continue PPI.  Hypothyroidism Continue home dose levothyroxine  Check TSH, T4.  Anxiety disorder ADHD: Currently will hold off on medications given her lethargic.   Advance Care Planning:   Code Status: Full Code discussed with patient  Consults: none  Family Communication: discussed with husband at bedside, he understands and agrees.  Severity of Illness: The appropriate patient status for this patient is OBSERVATION. Observation status is judged to be reasonable and necessary in  order to provide the required intensity of service to ensure the patient's safety. The patient's presenting symptoms, physical exam findings, and initial radiographic and laboratory data in the context of their medical condition is felt to place them at decreased risk for further clinical deterioration. Furthermore, it is anticipated that the patient will be medically stable for discharge from the hospital within 2 midnights of admission.   Author: Concepcion Riser, MD 07/19/2023 7:58 AM  For on call review [email or web address].

## 2023-07-19 NOTE — ED Notes (Signed)
 Assumed patient care and received report from the previous nurse.

## 2023-07-19 NOTE — ED Provider Notes (Addendum)
 [facility] Provider Note    Event Date/Time   First MD Initiated Contact with Patient 07/19/23 [phone]     (approximate)   History   Altered Mental Status and Fall   HPI  Jessica [name] is a 58 y.o. female   Past medical history of Remote Hodgkin's lymphoma, hypothyroid, GERD, prediabetic, here with altered mental status found down next to her bed by husband.  She is slower to respond, but is oriented to self and situation, and tells me that she has been sick since Sunday, coming on to 4 days of generalized weakness/fatigue/diffuse myalgias and dysuria.  She denies cough or congestion or sore throat.  She denies any acute traumatic injuries.  She denies abdominal pain nausea vomiting or diarrhea.  She does not use drugs or alcohol.  External Medical Documents Reviewed: October 2024 progress note from family medicine physician documenting past medical history and medications      Physical Exam   Triage Vital Signs: ED Triage Vitals  Encounter Vitals Group     BP      Systolic BP Percentile      Diastolic BP Percentile      Pulse      Resp      Temp      Temp src      SpO2      Weight      Height      Head Circumference      Peak Flow      Pain Score      Pain Loc      Pain Education      Exclude from Growth Chart     Most recent vital signs: Vitals:   07/19/23 0554 07/19/23 0558  BP:    Pulse: (!) 106   Temp:  98.1 F (36.7 C)  SpO2: 100%     General: Awake, no distress.  CV:  Good peripheral perfusion.  Resp:  Normal effort.  Abd:  No distention.  Other:  She looks dehydrated with cracked dry lips poor skin turgor mild tachycardia.  Other vital signs are normal.  She is afebrile.  She has a soft benign abdominal exam to deep palpation all quadrants.  She has no facial asymmetry or dysarthria and she is oriented and answering questions appropriately albeit sluggish, and is motor or sensory intact, moving all extremities and  following commands.  There is no signs of head trauma.  Neck is supple with full range of motion there is no midline tenderness.   ED Results / Procedures / Treatments   Labs (all labs ordered are listed, but only abnormal results are displayed) Labs Reviewed  URINALYSIS, ROUTINE W REFLEX MICROSCOPIC - Abnormal; Notable for the following components:      Result Value   Color, Urine AMBER (*)    APPearance CLOUDY (*)    Protein, ur 100 (*)    Bacteria, UA MANY (*)    All other components within normal limits  BASIC METABOLIC PANEL - Abnormal; Notable for the following components:   Potassium 2.6 (*)    Chloride 96 (*)    Glucose, Bld 138 (*)    BUN 22 (*)    All other components within normal limits  LACTIC ACID, PLASMA - Abnormal; Notable for the following components:   Lactic Acid, Venous 2.0 (*)    All other components within normal limits  CK - Abnormal; Notable for the following components:   Total CK 27 (*)  All other components within normal limits  HEPATIC FUNCTION PANEL - Abnormal; Notable for the following components:   Alkaline Phosphatase 225 (*)    All other components within normal limits  RESP PANEL BY RT-PCR (RSV, FLU A&B, COVID)  [id number]  CULTURE, BLOOD (ROUTINE X 2)  CULTURE, BLOOD (ROUTINE X 2)  URINE CULTURE  CBC WITH DIFFERENTIAL/PLATELET  TSH  T4, FREE  MAGNESIUM  TROPONIN I (HIGH SENSITIVITY)     I ordered and reviewed the above labs they are notable for WBC wnl  EKG  ED ECG REPORT I, Ginnie Shams, the attending physician, personally viewed and interpreted this ECG.   Date: 07/19/2023  EKG Time: 0610  Rate: 106  Rhythm: sinus tachycardia  Axis: nl  Intervals: prolonged QTC   ST&T Change: no stemi    RADIOLOGY I independently reviewed and interpreted CT of the head see no obvious bleeding or midline shift I also reviewed radiologist's formal read.   PROCEDURES:  Critical Care performed: Yes, see critical care procedure  note(s)  .Critical Care  Performed by: Shams Ginnie, MD Authorized by: Shams Ginnie, MD   Critical care provider statement:    Critical care time (minutes):  30   Critical care was time spent personally by me on the following activities:  Development of treatment plan with patient or surrogate, discussions with consultants, evaluation of patient's response to treatment, examination of patient, ordering and review of laboratory studies, ordering and review of radiographic studies, ordering and performing treatments and interventions, pulse oximetry, re-evaluation of patient's condition and review of old charts   MEDICATIONS ORDERED IN ED: Medications  potassium chloride  10 mEq in 100 mL IVPB (10 mEq Intravenous New Bag/Given 07/19/23 0654)  sodium chloride  0.9 % bolus 1,000 mL (1,000 mLs Intravenous New Bag/Given 07/19/23 0602)  acetaminophen  (TYLENOL ) tablet 650 mg (650 mg Oral Given 07/19/23 0603)  cefTRIAXone  (ROCEPHIN ) 1 g in sodium chloride  0.9 % 100 mL IVPB (0 g Intravenous Stopped 07/19/23 9346)    External physician / consultants:  I spoke with hospital medicine for admission and regarding care plan for this patient.  IMPRESSION / MDM / ASSESSMENT AND PLAN / ED COURSE  I reviewed the triage vital signs and the nursing notes.                                Patient's presentation is most consistent with acute presentation with potential threat to life or bodily function.  Differential diagnosis includes, but is not limited to, infection, dehydration, metabolic encephalopathy, skull fracture ICH, considered but less likely stroke   The patient is on the cardiac monitor to evaluate for evidence of arrhythmia and/or significant heart rate changes.  MDM:    This patient has had myalgias and fatigue for the last 4 days, poor p.o. intake, appears markedly dehydrated who was found at bedside unable to get up due to generalized weakness.  I suspect infection or severe dehydration, viral  illness, and so initial workup includes blood testing, ck, thyroid  testing, urinalysis, chest x-ray, viral swabs, and CT of the head.  She has no acute focal neurologic deficits history of stroke.  She is afebrile.  She has no localizing symptoms of infection aside from mild dysuria.  She has a mild tachycardia that I think is driven most obviously by her severe dehydration for which I have ordered IV fluid hydration.  I will defer antibiotics until source presents itself.  Given  her debilitated state she will require admission most likely.   --   UA suggests UTI so rocephin  ordered. Admit      FINAL CLINICAL IMPRESSION(S) / ED DIAGNOSES   Final diagnoses:  Altered mental status, unspecified altered mental status type  Dehydration  Generalized weakness  Myalgia  Other fatigue  Lower urinary tract infectious disease  Hypokalemia  Prolonged Q-T interval on ECG     Rx / DC Orders   ED Discharge Orders     None        Note:  This document was prepared using Dragon voice recognition software and may include unintentional dictation errors.    Cyrena Mylar, MD 07/19/23 9366    Cyrena Mylar, MD 07/19/23 [phone]

## 2023-07-19 NOTE — ED Notes (Signed)
Arm board removed.

## 2023-07-19 NOTE — ED Notes (Signed)
 Arm board applied to left arm for IV medication administration. Pt given additional warm blankets.

## 2023-07-19 NOTE — ED Triage Notes (Signed)
 Pt altered mental status upon waking up, pt fell this morning 0500. Found by husband. Pt husband was unable to get her off the floor due to pt weakness. Fall was unwitnessed.

## 2023-07-19 NOTE — Progress Notes (Signed)
    Against Medical Advice   Jessica [name] expresses desire to leave the Hospital immediately. Patient has been warned that this is not medically advisable at this time, and can result in medical complications like Death and Disability. Patient understands and accepts the risks involved and assumes full responsibilty of this decision.  This patient has also been advised that if they feel the need for further medical assistance to return to any available ER or dial [phone].  Discussed with patient, spouse present, need to follow up with PCP and intention is to see them on Monday. Patient also understands the need for maintaining adequate intake with frequent small food and fluids  Informed by Nursing staff that this patient has left care and has signed the form  Against Medical Advice on 07/19/2023 at 2012 PM

## 2023-07-21 LAB — URINE CULTURE: Culture: >=100000 — AB

## 2023-07-23 ENCOUNTER — Encounter: Payer: Self-pay | Admitting: Family Medicine

## 2023-07-23 ENCOUNTER — Ambulatory Visit: Payer: Commercial Managed Care - HMO | Admitting: Family Medicine

## 2023-07-23 VITALS — BP 112/76 | HR 100 | Resp 16 | Ht 65.0 in | Wt 136.5 lb

## 2023-07-23 DIAGNOSIS — B9620 Unspecified Escherichia coli [E. coli] as the cause of diseases classified elsewhere: Secondary | ICD-10-CM | POA: Diagnosis not present

## 2023-07-23 DIAGNOSIS — N390 Urinary tract infection, site not specified: Secondary | ICD-10-CM | POA: Diagnosis not present

## 2023-07-23 DIAGNOSIS — E876 Hypokalemia: Secondary | ICD-10-CM

## 2023-07-23 MED ORDER — LIDOCAINE HCL (PF) 1 % IJ SOLN
4.0000 mL | Freq: Once | INTRAMUSCULAR | Status: AC
  Administered 2023-07-23: 4 mL

## 2023-07-23 MED ORDER — SULFAMETHOXAZOLE-TRIMETHOPRIM 800-160 MG PO TABS: 1.0000 | ORAL_TABLET | Freq: Two times a day (BID) | ORAL | 0 refills | Status: DC

## 2023-07-23 MED ORDER — CEFTRIAXONE SODIUM 1 G IJ SOLR: 1.0000 g | Freq: Once | INTRAMUSCULAR | 0 refills | Status: DC

## 2023-07-23 MED ORDER — CEFTRIAXONE SODIUM 500 MG IJ SOLR
1000.0000 mg | Freq: Once | INTRAMUSCULAR | Status: AC
  Administered 2023-07-23: 1000 mg via INTRAMUSCULAR

## 2023-07-23 NOTE — Progress Notes (Signed)
Name: Jessica [name]   MRN: [id number]    DOB: [birth year]   Date:07/23/2023       Progress Note  Subjective  Chief Complaint  Chief Complaint  Patient presents with   ER Follow up    UTI   Discussed the use of AI scribe software for clinical note transcription with the patient, who gave verbal consent to proceed.  History of Present Illness   Jessica [name] "Jessica [name]" is a 58 year old female who presents for follow-up after an emergency room visit for a urinary tract infection and hypokalemia.  She has been feeling unwell since 02/02  experiencing symptoms akin to the flu, including body aches and significant epigastric pain. By Friday morning, she felt extremely weak,and had difficulty standing up, so her husband took her to [facility].  She was taken to the emergency room on February 7th, where she was diagnosed with low potassium levels and a urinary tract infection. She received five bags of potassium but left the hospital before receiving a planned Rocephin injection  The urinary tract infection was confirmed to be caused by E. coli, with resistance to ampicillin. No burning during urination was noted. She is feeling much better but continues to feel fatigue and weakness, although her epigastric pain has resolved       Patient Active Problem List   Diagnosis Date Noted   Acute metabolic encephalopathy 07/19/2023   Hypokalemia 07/19/2023   Lethargy 07/19/2023   General weakness 07/19/2023   Senile purpura (HCC) 11/20/2022   Folate deficiency 10/30/2022   Weight loss 10/30/2022   ASCUS with positive high risk HPV cervical 05/06/2022   Tubular adenoma of colon 05/06/2022   Iron deficiency anemia 01/09/2018   Cervical high risk HPV (human papillomavirus) test positive 04/08/2017   History of gastric bypass    Ulcer jejunum    Adenoma of left adrenal gland 02/18/2017   Abnormal TSH 08/30/2016   Bipolar disorder (HCC) 12/02/2015   HAV (hallux abducto valgus) 11/22/2015   Bunion of great  toe 11/08/2015   Attention-deficit hyperactivity disorder, other type 10/28/2015   Sciatica 04/22/2015   B12 deficiency 12/01/2014   History of bariatric surgery 12/01/2014   Anxiety, generalized 12/01/2014   Gastro-esophageal reflux disease without esophagitis 12/01/2014   H/O elevated lipids 12/01/2014   H/O: HTN (hypertension) 12/01/2014   H/O: obesity 12/01/2014   Hodgkin's disease in remission 12/01/2014   Current tobacco use 12/01/2014   Insomnia 03/24/2008   Abnormal serum level of alkaline phosphatase 11/08/2006   Chronic recurrent major depressive disorder (HCC) 11/08/2006    Past Surgical History:  Procedure Laterality Date   ABDOMINAL HYSTERECTOMY     BUNIONECTOMY Right    CARPAL TUNNEL RELEASE     COLONOSCOPY     multiple   COLONOSCOPY WITH PROPOFOL N/A 11/20/2019   Procedure: COLONOSCOPY WITH PROPOFOL;  Surgeon: Toney Reil, MD;  Location: [facility];  Service: Gastroenterology;  Laterality: N/A;   ESOPHAGOGASTRODUODENOSCOPY (EGD) WITH PROPOFOL N/A 02/21/2017   Procedure: ESOPHAGOGASTRODUODENOSCOPY (EGD) WITH PROPOFOL;  Surgeon: Midge Minium, MD;  Location: [facility];  Service: Gastroenterology;  Laterality: N/A;  Pre-diabetic   excision of neck mass  09/1998   GASTRIC BYPASS  03/12/2011   LAPAROSCOPIC SUPRACERVICAL HYSTERECTOMY  2008   and unilateral salpingoophorectomy/ endometriosis-Dr Kincius   TONSILLECTOMY      Family History  Problem Relation Age of Onset   Cancer Mother 69       colon   Epilepsy Mother  Multiple myeloma Mother    Glaucoma Father    Breast cancer Maternal Grandmother 5       inflamitory breast ca    Social History   Tobacco Use   Smoking status: Every Day    Current packs/day: 0.00    Average packs/day: 0.5 packs/day for 20.0 years (10.0 ttl pk-yrs)    Types: Cigarettes, E-cigarettes    Start date: 06/11/2000    Last attempt to quit: 06/03/2020    Years since quitting: 3.1   Smokeless tobacco: Never    Tobacco comments:    Only on e-cigarettes   Substance Use Topics   Alcohol use: No    Alcohol/week: 0.0 standard drinks of alcohol    Comment: rarely     Current Outpatient Medications:    amphetamine-dextroamphetamine (ADDERALL XR) 25 MG 24 hr capsule, Take 1 capsule by mouth every morning., Disp: 30 capsule, Rfl: 0   atorvastatin (LIPITOR) 40 MG tablet, TAKE 1 TABLET BY MOUTH EVERY DAY, Disp: 90 tablet, Rfl: 1   cyanocobalamin (VITAMIN B12) 1000 MCG/ML injection, Inject 1 mL (1,000 mcg total) into the muscle once monthly for 2 months, Disp: 6 mL, Rfl: 0   cyclobenzaprine (FLEXERIL) 5 MG tablet, Take 1 tablet (5 mg total) by mouth at bedtime., Disp: 90 tablet, Rfl: 1   DULoxetine (CYMBALTA) 60 MG capsule, Take 60 mg by mouth daily., Disp: , Rfl:    Eszopiclone 3 MG TABS, Take 3 mg by mouth at bedtime., Disp: , Rfl:    gabapentin (NEURONTIN) 100 MG capsule, Take 2 capsules (200 mg total) by mouth at bedtime., Disp: 180 capsule, Rfl: 0   lithium carbonate (ESKALITH) 450 MG ER tablet, Take 450 mg by mouth at bedtime., Disp: , Rfl:    lithium carbonate (LITHOBID) 300 MG ER tablet, Take 300 mg by mouth at bedtime., Disp: , Rfl:    Multiple Vitamins-Minerals (MULTI-VITAMIN GUMMIES) CHEW, Chew 2 capsules by mouth daily., Disp: 30 tablet, Rfl: 0   Needles & Syringes MISC, Inject 1mL monthly for 2 months, Disp: 2 each, Rfl: 0   omeprazole (PRILOSEC) 40 MG capsule, Take 1 capsule (40 mg total) by mouth daily., Disp: 90 capsule, Rfl: 1   QUEtiapine (SEROQUEL) 400 MG tablet, Take 800 mg by mouth at bedtime., Disp: , Rfl:    traZODone (DESYREL) 100 MG tablet, Take 100-200 mg by mouth at bedtime., Disp: , Rfl:    Cholecalciferol (VITAMIN D) 50 MCG (2000 UT) CAPS, Take 1 capsule (2,000 Units total) by mouth daily. (Patient not taking: Reported on 07/19/2023), Disp: 30 capsule, Rfl: 0   DULoxetine (CYMBALTA) 30 MG capsule, Take 30 mg by mouth 2 (two) times daily. (Patient not taking: Reported on 07/19/2023),  Disp: , Rfl:    folic acid (FOLVITE) 1 MG tablet, Take 1 tablet (1 mg total) by mouth daily. (Patient not taking: Reported on 07/19/2023), Disp: 90 tablet, Rfl: 0   levothyroxine (SYNTHROID) 25 MCG tablet, Take 1 tablet (25 mcg total) by mouth daily before breakfast., Disp: 90 tablet, Rfl: 1   sulfamethoxazole-trimethoprim (BACTRIM DS) 800-160 MG tablet, Take 1 tablet by mouth 2 (two) times daily. (Patient not taking: Reported on 07/19/2023), Disp: 6 tablet, Rfl: 0   zaleplon (SONATA) 10 MG capsule, Take 10 mg by mouth at bedtime. (Patient not taking: Reported on 07/19/2023), Disp: , Rfl:   Allergies  Allergen Reactions   Vicodin [Hydrocodone-Acetaminophen] Other (See Comments)    Has really bad headaches/ migraine      I personally reviewed  active problem list, medication list, allergies with the patient/caregiver today.   ROS  Ten systems reviewed and is negative except as mentioned in HPI    Objective  Vitals:   07/23/23 1054  BP: 112/76  Pulse: 100  Resp: 16  Weight: 136 lb 8 oz (61.9 kg)  Height: 5\' 5"  (1.651 m)    Body mass index is 22.71 kg/m.  Physical Exam  Constitutional: Patient appears well-developed and well-nourished.  No distress.  HEENT: head atraumatic, normocephalic, pupils equal and reactive to light, neck supple Cardiovascular: Normal rate, regular rhythm and normal heart sounds.  No murmur heard. No BLE edema. Pulmonary/Chest: Effort normal and breath sounds normal. No respiratory distress. Abdominal: Soft.  There is no tenderness. Negative CVA tenderness  Psychiatric: anxious   Recent Results (from the past 2160 hours)  Urinalysis, Routine w reflex microscopic -Urine, Clean Catch     Status: Abnormal   Collection Time: 07/19/23  5:47 AM  Result Value Ref Range   Color, Urine AMBER (A) YELLOW    Comment: BIOCHEMICALS MAY BE AFFECTED BY COLOR   APPearance CLOUDY (A) CLEAR   Specific Gravity, Urine 1.021 1.005 - 1.030   pH 5.0 5.0 - 8.0   Glucose, UA  NEGATIVE NEGATIVE mg/dL   Hgb urine dipstick NEGATIVE NEGATIVE   Bilirubin Urine NEGATIVE NEGATIVE   Ketones, ur NEGATIVE NEGATIVE mg/dL   Protein, ur 244 (A) NEGATIVE mg/dL   Nitrite NEGATIVE NEGATIVE   Leukocytes,Ua NEGATIVE NEGATIVE   RBC / HPF 0-5 0 - 5 RBC/hpf   WBC, UA 11-20 0 - 5 WBC/hpf   Bacteria, UA MANY (A) NONE SEEN   Squamous Epithelial / HPF 0-5 0 - 5 /HPF   Mucus PRESENT     Comment: Performed at [facility], [address] [place], [place] [address]  CBC with Differential     Status: None   Collection Time: 07/19/23  5:47 AM  Result Value Ref Range   WBC 7.8 4.0 - 10.5 K/uL   RBC 4.40 3.87 - 5.11 MIL/uL   Hemoglobin 12.5 12.0 - 15.0 g/dL   HCT 25.3 66.4 - 40.3 %   MCV 88.4 80.0 - 100.0 fL   MCH 28.4 26.0 - 34.0 pg   MCHC 32.1 30.0 - 36.0 g/dL   RDW 47.4 25.9 - 56.3 %   Platelets 389 150 - 400 K/uL   nRBC 0.0 0.0 - 0.2 %   Neutrophils Relative % 57 %   Neutro Abs 4.4 1.7 - 7.7 K/uL   Lymphocytes Relative 32 %   Lymphs Abs 2.5 0.7 - 4.0 K/uL   Monocytes Relative 10 %   Monocytes Absolute 0.8 0.1 - 1.0 K/uL   Eosinophils Relative 1 %   Eosinophils Absolute 0.1 0.0 - 0.5 K/uL   Basophils Relative 0 %   Basophils Absolute 0.0 0.0 - 0.1 K/uL   Immature Granulocytes 0 %   Abs Immature Granulocytes 0.02 0.00 - 0.07 K/uL    Comment: Performed at [facility], [address] [place], [place] [address]  Basic metabolic panel     Status: Abnormal   Collection Time: 07/19/23  5:47 AM  Result Value Ref Range   Sodium 135 135 - 145 mmol/L   Potassium 2.6 (LL) 3.5 - 5.1 mmol/L    Comment: CRITICAL RESULT CALLED TO, READ BACK BY AND VERIFIED WITH ANGELA ROBBINS AT 3329 07/19/23.PMF    Chloride 96 (L) 98 - 111 mmol/L   CO2 29 22 - 32  mmol/L   Glucose, Bld 138 (H) 70 - 99 mg/dL    Comment: Glucose reference range applies only to samples taken after fasting for at least 8 hours.   BUN 22 (H) 6 - 20 mg/dL   Creatinine, Ser 5.78 0.44 - 1.00 mg/dL    Calcium 9.2 8.9 - 46.9 mg/dL   GFR, Estimated >62 >95 mL/min    Comment: (NOTE) Calculated using the CKD-EPI Creatinine Equation (2021)    Anion gap 10 5 - 15    Comment: Performed at [facility], [address] [place], [place] [address]  Lactic acid, plasma     Status: Abnormal   Collection Time: 07/19/23  5:47 AM  Result Value Ref Range   Lactic Acid, Venous 2.0 (HH) 0.5 - 1.9 mmol/L    Comment: CRITICAL RESULT CALLED TO, READ BACK BY AND VERIFIED WITH ANGELA ROBBINS AT 2440 07/19/23.PMF Performed at [facility], [address] [place], [place] [address]   CK     Status: Abnormal   Collection Time: 07/19/23  5:47 AM  Result Value Ref Range   Total CK 27 (L) 38 - 234 U/L    Comment: Performed at [facility], [address] [place], [place] [address]  Hepatic function panel     Status: Abnormal   Collection Time: 07/19/23  5:47 AM  Result Value Ref Range   Total Protein 7.8 6.5 - 8.1 g/dL   Albumin 3.6 3.5 - 5.0 g/dL   AST 18 15 - 41 U/L   ALT 36 0 - 44 U/L   Alkaline Phosphatase 225 (H) 38 - 126 U/L   Total Bilirubin 0.7 0.0 - 1.2 mg/dL   Bilirubin, Direct 0.2 0.0 - 0.2 mg/dL   Indirect Bilirubin 0.5 0.3 - 0.9 mg/dL    Comment: Performed at [facility], [address] [place], [place] [address]  TSH     Status: None   Collection Time: 07/19/23  5:47 AM  Result Value Ref Range   TSH 2.663 0.350 - 4.500 uIU/mL    Comment: Performed by a 3rd Generation assay with a functional sensitivity of <=0.01 uIU/mL. Performed at [facility], [address] [place], [place] [address]   T4, free     Status: None   Collection Time: 07/19/23  5:47 AM  Result Value Ref Range   Free T4 1.05 0.61 - 1.12 ng/dL    Comment: (NOTE) Biotin ingestion may interfere with free T4 tests. If the results are inconsistent with the TSH level, previous test results, or the clinical presentation, then consider biotin interference. If needed, order  repeat testing after stopping biotin. Performed at [facility], [address] [place], [place] [address]   Troponin I (High Sensitivity)     Status: None   Collection Time: 07/19/23  5:47 AM  Result Value Ref Range   Troponin I (High Sensitivity) 5 <18 ng/L    Comment: (NOTE) Elevated high sensitivity troponin I (hsTnI) values and significant  changes across serial measurements may suggest ACS but many other  chronic and acute conditions are known to elevate hsTnI results.  Refer to the Links section for chest pain algorithms and additional  guidance. Performed at [facility], [address], [place], [place] [address]   Urine Culture (for pregnant, neutropenic or urologic patients or patients with an indwelling urinary catheter)     Status: Abnormal   Collection Time: 07/19/23  5:47 AM   Specimen: Urine, Clean Catch  Result Value Ref Range   Specimen Description      URINE, CLEAN CATCH Performed at [facility], [address] [place], [place] [address]    Special Requests      NONE Performed at [facility], [address] [place], [place] [address]    Culture >=100,000 COLONIES/mL ESCHERICHIA COLI (A)    Report Status 07/21/2023 FINAL    Organism ID, Bacteria ESCHERICHIA COLI (A)       Susceptibility   Escherichia coli - MIC*    AMPICILLIN >=32 RESISTANT Resistant     CEFAZOLIN <=4 SENSITIVE Sensitive     CEFEPIME <=0.12 SENSITIVE Sensitive     CEFTRIAXONE <=0.25 SENSITIVE Sensitive     CIPROFLOXACIN <=0.25 SENSITIVE Sensitive     GENTAMICIN <=1 SENSITIVE Sensitive     IMIPENEM <=0.25 SENSITIVE Sensitive     NITROFURANTOIN 32 SENSITIVE Sensitive     TRIMETH/SULFA <=20 SENSITIVE Sensitive     AMPICILLIN/SULBACTAM 4 SENSITIVE Sensitive     PIP/TAZO <=4 SENSITIVE Sensitive ug/mL    * >=100,000 COLONIES/mL ESCHERICHIA COLI  Magnesium     Status: None   Collection Time: 07/19/23  5:47 AM  Result Value Ref Range   Magnesium  2.0 1.7 - 2.4 mg/dL    Comment: Performed at [facility], [address] [place], [place] [address]  Hemoglobin A1c     Status: None   Collection Time: 07/19/23  5:47 AM  Result Value Ref Range   Hgb A1c MFr Bld 5.4 4.8 - 5.6 %    Comment: (NOTE) Pre diabetes:          5.7%-6.4%  Diabetes:              >6.4%  Glycemic control for   <7.0% adults with diabetes    Mean Plasma Glucose 108.28 mg/dL    Comment: Performed at [facility], [address] [place], [place] [address]  Resp panel by RT-PCR (RSV, Flu A&B, Covid) Urine, Clean Catch     Status: None   Collection Time: 07/19/23  5:49 AM   Specimen: Urine, Clean Catch; Nasal Swab  Result Value Ref Range   SARS Coronavirus 2 by RT PCR NEGATIVE NEGATIVE    Comment: (NOTE) SARS-CoV-2 target nucleic acids are NOT DETECTED.  The SARS-CoV-2 RNA is generally detectable in upper respiratory specimens during the acute phase of infection. The lowest concentration of SARS-CoV-2 viral copies this assay can detect is 138 copies/mL. A negative result does not preclude SARS-Cov-2 infection and should not be used as the sole basis for treatment or other patient management decisions. A negative result may occur with  improper specimen collection/handling, submission of specimen other than nasopharyngeal swab, presence of viral mutation(s) within the areas targeted by this assay, and inadequate number of viral copies(<138 copies/mL). A negative result must be combined with clinical observations, patient history, and epidemiological information. The expected result is Negative.  Fact Sheet for Patients:  BloggerCourse.com  Fact Sheet for Healthcare Providers:  SeriousBroker.it  This test is no t yet approved or cleared by the [place] FDA and  has been authorized for detection and/or diagnosis of SARS-CoV-2 by FDA under an Emergency Use Authorization (EUA). This EUA  will remain  in effect (meaning this test can be used) for the duration of the COVID-19 declaration under Section 564(b)(1) of the Act, 21 U.S.C.section 360bbb-3(b)(1), unless the authorization is terminated  or revoked sooner.       Influenza A by PCR  NEGATIVE NEGATIVE   Influenza B by PCR NEGATIVE NEGATIVE    Comment: (NOTE) The Xpert Xpress SARS-CoV-2/FLU/RSV plus assay is intended as an aid in the diagnosis of influenza from Nasopharyngeal swab specimens and should not be used as a sole basis for treatment. Nasal washings and aspirates are unacceptable for Xpert Xpress SARS-CoV-2/FLU/RSV testing.  Fact Sheet for Patients: BloggerCourse.com  Fact Sheet for Healthcare Providers: SeriousBroker.it  This test is not yet approved or cleared by the [place] FDA and has been authorized for detection and/or diagnosis of SARS-CoV-2 by FDA under an Emergency Use Authorization (EUA). This EUA will remain in effect (meaning this test can be used) for the duration of the COVID-19 declaration under Section 564(b)(1) of the Act, 21 U.S.C. section 360bbb-3(b)(1), unless the authorization is terminated or revoked.     Resp Syncytial Virus by PCR NEGATIVE NEGATIVE    Comment: (NOTE) Fact Sheet for Patients: BloggerCourse.com  Fact Sheet for Healthcare Providers: SeriousBroker.it  This test is not yet approved or cleared by the [place] FDA and has been authorized for detection and/or diagnosis of SARS-CoV-2 by FDA under an Emergency Use Authorization (EUA). This EUA will remain in effect (meaning this test can be used) for the duration of the COVID-19 declaration under Section 564(b)(1) of the Act, 21 U.S.C. section 360bbb-3(b)(1), unless the authorization is terminated or revoked.  Performed at [facility], [address] [place], [place] [address]   Blood  Culture (routine x 2)     Status: None (Preliminary result)   Collection Time: 07/19/23  5:50 AM   Specimen: BLOOD  Result Value Ref Range   Specimen Description BLOOD RAC    Special Requests      BOTTLES DRAWN AEROBIC AND ANAEROBIC Blood Culture adequate volume   Culture      NO GROWTH 4 DAYS Performed at [facility], [address] [place], [place] [address]    Report Status PENDING   Blood Culture (routine x 2)     Status: None (Preliminary result)   Collection Time: 07/19/23  6:47 AM   Specimen: BLOOD  Result Value Ref Range   Specimen Description BLOOD  L ARM    Special Requests      BOTTLES DRAWN AEROBIC AND ANAEROBIC Blood Culture adequate volume   Culture      NO GROWTH 4 DAYS Performed at [facility], [address], [place], [place] [address]    Report Status PENDING   Troponin I (High Sensitivity)     Status: None   Collection Time: 07/19/23  8:23 AM  Result Value Ref Range   Troponin I (High Sensitivity) 5 <18 ng/L    Comment: (NOTE) Elevated high sensitivity troponin I (hsTnI) values and significant  changes across serial measurements may suggest ACS but many other  chronic and acute conditions are known to elevate hsTnI results.  Refer to the "Links" section for chest pain algorithms and additional  guidance. Performed at [facility], [address] [place], [place] [address]     Diabetic Foot Exam:     PHQ2/9:    07/23/2023   10:53 AM 03/22/2023    8:58 AM 02/20/2023    7:42 AM 11/20/2022    9:35 AM 05/10/2022   10:28 AM  Depression screen PHQ 2/9  Decreased Interest 3 3 3 1 1   Down, Depressed, Hopeless 3 3 3 1 1   PHQ - 2 Score 6 6 6 2 2   Altered sleeping 3 3  3 3 1   Tired, decreased energy 3 3 3 3 1   Change in appetite 0 0 3 0 1  Feeling bad or failure about yourself  0 0 3 0 0  Trouble concentrating 3 3 3 1 1   Moving slowly or fidgety/restless 0 0 0 0 0  Suicidal thoughts 0 0 0 0 0  PHQ-9 Score 15 15 21 9 6    Difficult doing work/chores Extremely dIfficult  Extremely dIfficult  Somewhat difficult    phq 9 is positive  Fall Risk:    07/23/2023   10:49 AM 03/22/2023    8:58 AM 02/20/2023    7:42 AM 11/20/2022    9:35 AM 05/10/2022   10:28 AM  Fall Risk   Falls in the past year? 0 0 1 1 0  Number falls in past yr: 0 0 0 1 0  Injury with Fall? 0 0 1 0 0  Risk for fall due to : No Fall Risks No Fall Risks No Fall Risks Impaired balance/gait No Fall Risks  Follow up Falls prevention discussed;Education provided;Falls evaluation completed Falls prevention discussed Falls prevention discussed Falls prevention discussed Falls prevention discussed;Education provided;Falls evaluation completed     Assessment & Plan     Urinary Tract Infection E. Coli infection confirmed from recent ER visit. Patient did not receive Rocephin injection as planned due to early discharge. No allergy to penicillin. -Administer Rocephin 1 g injection today. -Prescribe oral Sulfa medication for 3 days.  Hypokalemia Recent ER visit revealed low potassium (2.0). Patient received 5 bags of potassium in ER and has been consuming potassium-rich foods since. -Check potassium levels today.  General Health Maintenance / Followup Plans -Continue current psychiatric medications as managed by psychiatrist. -Consider referral to a therapist for additional support. -Return visit in March to reassess UTI and overall health status.

## 2023-07-24 ENCOUNTER — Other Ambulatory Visit: Payer: Self-pay | Admitting: Family Medicine

## 2023-07-24 ENCOUNTER — Other Ambulatory Visit: Payer: Self-pay

## 2023-07-24 ENCOUNTER — Encounter: Payer: Self-pay | Admitting: Family Medicine

## 2023-07-24 DIAGNOSIS — E039 Hypothyroidism, unspecified: Secondary | ICD-10-CM

## 2023-07-24 DIAGNOSIS — F3162 Bipolar disorder, current episode mixed, moderate: Secondary | ICD-10-CM

## 2023-07-24 DIAGNOSIS — E538 Deficiency of other specified B group vitamins: Secondary | ICD-10-CM

## 2023-07-24 LAB — CULTURE, BLOOD (ROUTINE X 2)
Culture: NO GROWTH
Culture: NO GROWTH
Special Requests: ADEQUATE
Special Requests: ADEQUATE

## 2023-07-24 LAB — POTASSIUM: Potassium: 3.4 mmol/L — ABNORMAL LOW (ref 3.5–5.3)

## 2023-07-24 MED ORDER — FOLIC ACID 1 MG PO TABS
1.0000 mg | ORAL_TABLET | Freq: Every day | ORAL | 0 refills | Status: DC
  Filled 2023-07-24: qty 90, 90d supply, fill #0

## 2023-08-07 ENCOUNTER — Other Ambulatory Visit: Payer: Self-pay

## 2023-08-18 ENCOUNTER — Other Ambulatory Visit: Payer: Self-pay | Admitting: Family Medicine

## 2023-08-18 DIAGNOSIS — M545 Low back pain, unspecified: Secondary | ICD-10-CM

## 2023-08-22 ENCOUNTER — Ambulatory Visit: Payer: Self-pay | Admitting: Family Medicine

## 2023-09-06 ENCOUNTER — Inpatient Hospital Stay: Payer: Commercial Managed Care - HMO | Attending: Oncology

## 2023-09-06 DIAGNOSIS — E538 Deficiency of other specified B group vitamins: Secondary | ICD-10-CM | POA: Insufficient documentation

## 2023-09-06 DIAGNOSIS — D509 Iron deficiency anemia, unspecified: Secondary | ICD-10-CM | POA: Insufficient documentation

## 2023-09-06 DIAGNOSIS — C819A Hodgkin lymphoma, unspecified, in remission: Secondary | ICD-10-CM | POA: Insufficient documentation

## 2023-09-06 LAB — CBC WITH DIFFERENTIAL (CANCER CENTER ONLY)
Abs Immature Granulocytes: 0.02 10*3/uL (ref 0.00–0.07)
Basophils Absolute: 0 10*3/uL (ref 0.0–0.1)
Basophils Relative: 1 %
Eosinophils Absolute: 0.2 10*3/uL (ref 0.0–0.5)
Eosinophils Relative: 4 %
HCT: 35.9 % — ABNORMAL LOW (ref 36.0–46.0)
Hemoglobin: 11 g/dL — ABNORMAL LOW (ref 12.0–15.0)
Immature Granulocytes: 0 %
Lymphocytes Relative: 44 %
Lymphs Abs: 2.6 10*3/uL (ref 0.7–4.0)
MCH: 28.7 pg (ref 26.0–34.0)
MCHC: 30.6 g/dL (ref 30.0–36.0)
MCV: 93.7 fL (ref 80.0–100.0)
Monocytes Absolute: 0.4 10*3/uL (ref 0.1–1.0)
Monocytes Relative: 6 %
Neutro Abs: 2.6 10*3/uL (ref 1.7–7.7)
Neutrophils Relative %: 45 %
Platelet Count: 358 10*3/uL (ref 150–400)
RBC: 3.83 MIL/uL — ABNORMAL LOW (ref 3.87–5.11)
RDW: 15.7 % — ABNORMAL HIGH (ref 11.5–15.5)
WBC Count: 5.8 10*3/uL (ref 4.0–10.5)
nRBC: 0 % (ref 0.0–0.2)

## 2023-09-06 LAB — IRON AND TIBC
Iron: 67 ug/dL (ref 28–170)
Saturation Ratios: 19 % (ref 10.4–31.8)
TIBC: 347 ug/dL (ref 250–450)
UIBC: 280 ug/dL

## 2023-09-06 LAB — VITAMIN B12: Vitamin B-12: 126 pg/mL — ABNORMAL LOW (ref 180–914)

## 2023-09-06 LAB — FERRITIN: Ferritin: 13 ng/mL (ref 11–307)

## 2023-09-06 LAB — FOLATE: Folate: 6.5 ng/mL (ref >5.9)

## 2023-09-09 ENCOUNTER — Encounter: Payer: Self-pay | Admitting: Oncology

## 2023-09-09 ENCOUNTER — Inpatient Hospital Stay (HOSPITAL_BASED_OUTPATIENT_CLINIC_OR_DEPARTMENT_OTHER): Payer: Commercial Managed Care - HMO | Admitting: Oncology

## 2023-09-09 ENCOUNTER — Inpatient Hospital Stay: Payer: Commercial Managed Care - HMO

## 2023-09-09 VITALS — BP 113/76 | HR 102 | Temp 98.0°F | Resp 18 | Wt 144.4 lb

## 2023-09-09 DIAGNOSIS — D500 Iron deficiency anemia secondary to blood loss (chronic): Secondary | ICD-10-CM

## 2023-09-09 DIAGNOSIS — C819A Hodgkin lymphoma, unspecified, in remission: Secondary | ICD-10-CM

## 2023-09-09 DIAGNOSIS — D509 Iron deficiency anemia, unspecified: Secondary | ICD-10-CM | POA: Diagnosis not present

## 2023-09-09 DIAGNOSIS — E538 Deficiency of other specified B group vitamins: Secondary | ICD-10-CM | POA: Diagnosis not present

## 2023-09-09 DIAGNOSIS — Z9884 Bariatric surgery status: Secondary | ICD-10-CM | POA: Diagnosis not present

## 2023-09-09 NOTE — Assessment & Plan Note (Signed)
 Continue Folvite

## 2023-09-09 NOTE — Progress Notes (Signed)
 Hematology/Oncology Consult note [phone] [phone]         Patient Care Team: Alba Cory, MD as PCP - General (Family Medicine) Farrel Conners, CNM (Inactive) as Midwife (Certified Nurse Midwife) Midge Minium, MD as Consulting Physician (Gastroenterology) Andee Poles, [facility] (Inactive) (Pharmacist) Rickard Patience, MD as Consulting Physician (Oncology)  REFERRING PROVIDER: Alba Cory, MD    ASSESSMENT & PLAN:   Iron deficiency anemia Labs are reviewed and discussed with patient.' Lab Results  Component Value Date   HGB 11.0 (L) 09/06/2023   TIBC 347 09/06/2023   IRONPCTSAT 19 09/06/2023   FERRITIN 13 09/06/2023  Recommend IV Venofer weekly x 2   Hodgkin's disease in remission (HCC) Clinically she has no constitutional symptoms except weight loss which was felt to be due to gastric bypass and medication.  Continue monitor clinically.    B12 deficiency B12 deficiency, -Patient prefers self injection. Recommend B12 weekly injections x4 followed by monthly injections.  20-month supply sent to pharmacy.     Folate deficiency Continue Folvite.    History of gastric bypass As a risk of deficiency in vitamins.  Will check iron labs, folate, B12 periodically. GERD/history of ulcer recommend patient to follow-up with gastroenterology.  Orders Placed This Encounter  Procedures   CBC with Differential (Cancer Center Only)    Standing Status:   Future    Expected Date:   03/10/2024    Expiration Date:   09/08/2024   Iron and TIBC    Standing Status:   Future    Expected Date:   03/10/2024    Expiration Date:   09/08/2024   Ferritin    Standing Status:   Future    Expected Date:   03/10/2024    Expiration Date:   09/08/2024   Vitamin B12    Standing Status:   Future    Expected Date:   03/10/2024    Expiration Date:   09/08/2024   Retic Panel    Standing Status:   Future    Expected Date:   03/10/2024    Expiration Date:   09/08/2024    Folate    Standing Status:   Future    Expected Date:   03/10/2024    Expiration Date:   09/08/2024   Follow-up in 6 months All questions were answered. The patient knows to call the clinic with any problems, questions or concerns.  Rickard Patience, MD, PhD [facility] Hematology Oncology 09/09/2023   CHIEF COMPLAINTS/REASON FOR VISIT:  Iron deficiency anemia, history of Hodgkin's lymphoma.   HISTORY OF PRESENTING ILLNESS:   Jessica [name] is a  58 y.o.  female with PMH listed below was seen in consultation at the request of  Alba Cory, MD  for evaluation of Iron deficiency anemia, history of Hodgkin's lymphoma  Patient was last seen by me on 02/26/2018. Lost follow up . She presents to re establish care.  12/11/2021 cbc showed Hb 11.8, ferritin 4, iron saturation 11.   History of Hodgkin lymphoma, diagnosed in 2000, treated with ABVD x 4 cycles and mantle radiation. History of gastric bypass in 2013, reports feeling chronic intermittent epigastric pain for the past 12 months.  In September 2019 she went to ER for epigastric pain evaluation. CT abdomen Pelvis showed essentric wall thickening at the gastrojejunal anastomosis. Non specific new mild gastrohepatic ligament lymphadenopathy.  Stable left adrenal adenoma.   Denies hematochezia, hematuria, hematemesis, epistaxis, black tarry stool or easy bruising.   INTERVAL HISTORY Jessica Sliwa  [name] is a 57 y.o. female who has above history reviewed by me today presents for follow up visit for iron deficiency anemia, history of Hodgkin's lymphoma. She self administrated vitamin B12 injections monthly.   She has gained weight since coming off Ozempic     MEDICAL HISTORY:  Past Medical History:  Diagnosis Date   ADHD (attention deficit hyperactivity disorder)    Anxiety    Bipolar 1 disorder (HCC)    Cancer (HCC)    Hodgkin's Lymphoma 09/1998   Depression    Endometriosis    GERD (gastroesophageal reflux disease)    History of  dysplastic nevus 05/04/2020   right abdomen (side) upper/severe   History of gastric bypass 2012   Hodgkin's lymphoma (HCC) 2000   Hypothyroidism    Pre-diabetes    Tobacco use disorder    Ulcer jejunum     SURGICAL HISTORY: Past Surgical History:  Procedure Laterality Date   ABDOMINAL HYSTERECTOMY     BUNIONECTOMY Right    CARPAL TUNNEL RELEASE     COLONOSCOPY     multiple   COLONOSCOPY WITH PROPOFOL N/A 11/20/2019   Procedure: COLONOSCOPY WITH PROPOFOL;  Surgeon: Toney Reil, MD;  Location: [facility];  Service: Gastroenterology;  Laterality: N/A;   ESOPHAGOGASTRODUODENOSCOPY (EGD) WITH PROPOFOL N/A 02/21/2017   Procedure: ESOPHAGOGASTRODUODENOSCOPY (EGD) WITH PROPOFOL;  Surgeon: Midge Minium, MD;  Location: [facility];  Service: Gastroenterology;  Laterality: N/A;  Pre-diabetic   excision of neck mass  09/1998   GASTRIC BYPASS  03/12/2011   LAPAROSCOPIC SUPRACERVICAL HYSTERECTOMY  2008   and unilateral salpingoophorectomy/ endometriosis-Dr Kincius   TONSILLECTOMY      SOCIAL HISTORY: Social History   Socioeconomic History   Marital status: Married    Spouse name: Educational psychologist   Number of children: 2   Years of education: Not on file   Highest education level: Associate degree: occupational, Scientist, product/process development, or vocational program  Occupational History   Occupation: house wife   Tobacco Use   Smoking status: Every Day    Current packs/day: 0.00    Average packs/day: 0.5 packs/day for 20.0 years (10.0 ttl pk-yrs)    Types: Cigarettes, E-cigarettes    Start date: 06/11/2000    Last attempt to quit: 06/03/2020    Years since quitting: 3.2   Smokeless tobacco: Never   Tobacco comments:    Only on e-cigarettes   Vaping Use   Vaping status: Some Days  Substance and Sexual Activity   Alcohol use: No    Alcohol/week: 0.0 standard drinks of alcohol    Comment: rarely   Drug use: No   Sexual activity: Yes    Partners: Male    Birth control/protection: Surgical   Other Topics Concern   Not on file  Social History Narrative   Not on file   Social Drivers of Health   Financial Resource Strain: Low Risk  (03/22/2023)   Overall Financial Resource Strain (CARDIA)    Difficulty of Paying Living Expenses: Not hard at all  Food Insecurity: No Food Insecurity (03/22/2023)   Hunger Vital Sign    Worried About Running Out of Food in the Last Year: Never true    Ran Out of Food in the Last Year: Never true  Transportation Needs: No Transportation Needs (03/22/2023)   PRAPARE - Administrator, Civil Service (Medical): No    Lack of Transportation (Non-Medical): No  Physical Activity: Insufficiently Active (03/22/2023)   Exercise Vital Sign    Days of Exercise  per Week: 2 days    Minutes of Exercise per Session: 30 min  Stress: No Stress Concern Present (03/22/2023)   [employer] of Occupational Health - Occupational Stress Questionnaire    Feeling of Stress : Not at all  Social Connections: Moderately Integrated (03/22/2023)   Social Connection and Isolation Panel [NHANES]    Frequency of Communication with Friends and Family: More than three times a week    Frequency of Social Gatherings with Friends and Family: More than three times a week    Attends Religious Services: More than 4 times per year    Active Member of [employer] or Organizations: No    Attends Banker Meetings: Never    Marital Status: Married  Catering manager Violence: Not At Risk (03/22/2023)   Humiliation, Afraid, Rape, and Kick questionnaire    Fear of Current or Ex-Partner: No    Emotionally Abused: No    Physically Abused: No    Sexually Abused: No    FAMILY HISTORY: Family History  Problem Relation Age of Onset   Cancer Mother 52       colon   Epilepsy Mother    Multiple myeloma Mother    Glaucoma Father    Breast cancer Maternal Grandmother 41       inflamitory breast ca    ALLERGIES:  is allergic to vicodin  [hydrocodone-acetaminophen].  MEDICATIONS:  Current Outpatient Medications  Medication Sig Dispense Refill   amphetamine-dextroamphetamine (ADDERALL XR) 25 MG 24 hr capsule Take 1 capsule by mouth every morning. 30 capsule 0   atorvastatin (LIPITOR) 40 MG tablet TAKE 1 TABLET BY MOUTH EVERY DAY 90 tablet 1   Cholecalciferol (VITAMIN D) 50 MCG (2000 UT) CAPS Take 1 capsule (2,000 Units total) by mouth daily. 30 capsule 0   cyanocobalamin (VITAMIN B12) 1000 MCG/ML injection Inject 1 mL (1,000 mcg total) into the muscle once monthly for 2 months 6 mL 0   cyclobenzaprine (FLEXERIL) 5 MG tablet Take 1 tablet (5 mg total) by mouth at bedtime. 90 tablet 1   DULoxetine (CYMBALTA) 60 MG capsule Take 60 mg by mouth daily.     folic acid (FOLVITE) 1 MG tablet Take 1 tablet (1 mg total) by mouth daily. 90 tablet 0   gabapentin (NEURONTIN) 100 MG capsule Take 2 capsules (200 mg total) by mouth at bedtime. 180 capsule 0   levothyroxine (SYNTHROID) 25 MCG tablet Take 1 tablet (25 mcg total) by mouth daily before breakfast. 90 tablet 1   lithium carbonate (ESKALITH) 450 MG ER tablet Take 450 mg by mouth at bedtime.     lithium carbonate (LITHOBID) 300 MG ER tablet Take 300 mg by mouth at bedtime.     Multiple Vitamins-Minerals (MULTI-VITAMIN GUMMIES) CHEW Chew 2 capsules by mouth daily. 30 tablet 0   Needles & Syringes MISC Inject 1mL monthly for 2 months 2 each 0   omeprazole (PRILOSEC) 40 MG capsule Take 1 capsule (40 mg total) by mouth daily. 90 capsule 1   QUEtiapine (SEROQUEL) 400 MG tablet Take 800 mg by mouth at bedtime.     traZODone (DESYREL) 100 MG tablet Take 100-200 mg by mouth at bedtime.     No current facility-administered medications for this visit.    Review of Systems  Constitutional:  Positive for fatigue. Negative for appetite change, chills and fever.  HENT:   Negative for hearing loss and voice change.   Eyes:  Negative for eye problems.  Respiratory:  Negative for chest tightness  and cough.   Cardiovascular:  Negative for chest pain.  Gastrointestinal:  Negative for abdominal distention, abdominal pain and blood in stool.  Endocrine: Negative for hot flashes.  Genitourinary:  Negative for difficulty urinating and frequency.   Musculoskeletal:  Negative for arthralgias.  Skin:  Negative for itching and rash.  Neurological:  Negative for extremity weakness.  Hematological:  Negative for adenopathy.  Psychiatric/Behavioral:  Negative for confusion.    PHYSICAL EXAMINATION: ECOG PERFORMANCE STATUS: 1 - Symptomatic but completely ambulatory Vitals:   09/09/23 1307  BP: 113/76  Pulse: (!) 102  Resp: 18  Temp: 98 F (36.7 C)   Filed Weights   09/09/23 1307  Weight: 144 lb 6.4 oz (65.5 kg)    Physical Exam Constitutional:      General: She is not in acute distress. HENT:     Head: Normocephalic and atraumatic.  Eyes:     General: No scleral icterus. Cardiovascular:     Rate and Rhythm: Normal rate and regular rhythm.     Heart sounds: Normal heart sounds.  Pulmonary:     Effort: Pulmonary effort is normal. No respiratory distress.     Breath sounds: No wheezing.  Abdominal:     General: Bowel sounds are normal. There is no distension.     Palpations: Abdomen is soft.  Musculoskeletal:        General: No deformity. Normal range of motion.     Cervical back: Normal range of motion and neck supple.  Skin:    General: Skin is warm and dry.     Findings: No erythema or rash.  Neurological:     Mental Status: She is alert and oriented to person, place, and time. Mental status is at baseline.     Cranial Nerves: No cranial nerve deficit.     Coordination: Coordination normal.  Psychiatric:        Mood and Affect: Mood normal.     LABORATORY DATA:  I have reviewed the data as listed    Latest Ref Rng & Units 09/06/2023    9:51 AM 07/19/2023    5:47 AM 03/11/2023    8:45 AM  CBC  WBC 4.0 - 10.5 K/uL 5.8  7.8  7.6   Hemoglobin 12.0 - 15.0 g/dL 16.1   09.6  04.5   Hematocrit 36.0 - 46.0 % 35.9  38.9  37.0   Platelets 150 - 400 K/uL 358  389  327       Latest Ref Rng & Units 07/23/2023   11:28 AM 07/19/2023    5:47 AM 02/20/2023    8:28 AM  CMP  Glucose 70 - 99 mg/dL  409  83   BUN 6 - 20 mg/dL  22  11   Creatinine 8.11 - 1.00 mg/dL  9.14  7.82   Sodium 956 - 145 mmol/L  135  138   Potassium 3.5 - 5.3 mmol/L 3.4  2.6  3.9   Chloride 98 - 111 mmol/L  96  103   CO2 22 - 32 mmol/L  29  24   Calcium 8.9 - 10.3 mg/dL  9.2  9.3   Total Protein 6.5 - 8.1 g/dL  7.8  7.0   Total Bilirubin 0.0 - 1.2 mg/dL  0.7  0.6   Alkaline Phos 38 - 126 U/L  225    AST 15 - 41 U/L  18  11   ALT 0 - 44 U/L  36  15  RADIOGRAPHIC STUDIES: I have personally reviewed the radiological images as listed and agreed with the findings in the report. DG Chest Port 1 View Result Date: 07/19/2023 CLINICAL DATA:  58 year old female status post unwitnessed fall, found down. Weakness. EXAM: PORTABLE CHEST 1 VIEW COMPARISON:  Chest radiographs 01/11/2018 and earlier. FINDINGS: Seated AP portable view at 0624 hours. Lung volumes and mediastinal contours remain normal. Visualized tracheal air column is within normal limits. Allowing for portable technique the lungs are clear. No pneumothorax or pleural effusion. Paucity of bowel gas in the upper abdomen. No acute osseous abnormality identified. IMPRESSION: Negative portable chest. Electronically Signed   By: Odessa Fleming M.D.   On: 07/19/2023 06:34   CT Head Wo Contrast Result Date: 07/19/2023 CLINICAL DATA:  Head trauma with mental status changes. Larey Seat this morning and could not get up. EXAM: CT HEAD WITHOUT CONTRAST TECHNIQUE: Contiguous axial images were obtained from the base of the skull through the vertex without intravenous contrast. RADIATION DOSE REDUCTION: This exam was performed according to the departmental dose-optimization program which includes automated exposure control, adjustment of the mA and/or kV according to  patient size and/or use of iterative reconstruction technique. COMPARISON:  None Available. FINDINGS: Brain: No evidence of acute infarction, hemorrhage, hydrocephalus, extra-axial collection or mass lesion/mass effect. There is mild age-related cerebral cortical volume loss without significant small-vessel changes in the white matter. No midline shift. Basal cisterns are clear. Vascular: No hyperdense vessel or unexpected calcification. Skull: Negative for fractures or focal lesions. No visible scalp hematoma. Sinuses/Orbits: No acute finding. Other: None. IMPRESSION: No acute intracranial CT findings or depressed skull fractures. Mild age-related cortical volume loss. Electronically Signed   By: Almira Bar M.D.   On: 07/19/2023 06:32

## 2023-09-09 NOTE — Assessment & Plan Note (Signed)
Clinically she has no constitutional symptoms except weight loss which was felt to be due to gastric bypass and medication.  Continue monitor clinically.

## 2023-09-09 NOTE — Assessment & Plan Note (Signed)
 Labs are reviewed and discussed with patient.' Lab Results  Component Value Date   HGB 11.0 (L) 09/06/2023   TIBC 347 09/06/2023   IRONPCTSAT 19 09/06/2023   FERRITIN 13 09/06/2023  Recommend IV Venofer weekly x 2

## 2023-09-09 NOTE — Assessment & Plan Note (Addendum)
 B12 deficiency, -Patient prefers self injection. Recommend B12 weekly injections x4 followed by monthly injections.  17-month supply sent to pharmacy.

## 2023-09-09 NOTE — Assessment & Plan Note (Signed)
As a risk of deficiency in vitamins.  Will check iron labs, folate, B12 periodically. GERD/history of ulcer recommend patient to follow-up with gastroenterology.

## 2023-09-12 ENCOUNTER — Encounter: Payer: Self-pay | Admitting: Family Medicine

## 2023-09-12 ENCOUNTER — Ambulatory Visit: Admitting: Family Medicine

## 2023-09-12 VITALS — BP 112/74 | HR 92 | Resp 16 | Ht 65.0 in | Wt 144.0 lb

## 2023-09-12 DIAGNOSIS — Z13820 Encounter for screening for osteoporosis: Secondary | ICD-10-CM

## 2023-09-12 DIAGNOSIS — Z1211 Encounter for screening for malignant neoplasm of colon: Secondary | ICD-10-CM

## 2023-09-12 DIAGNOSIS — C819A Hodgkin lymphoma, unspecified, in remission: Secondary | ICD-10-CM

## 2023-09-12 DIAGNOSIS — E2839 Other primary ovarian failure: Secondary | ICD-10-CM

## 2023-09-12 DIAGNOSIS — F3162 Bipolar disorder, current episode mixed, moderate: Secondary | ICD-10-CM

## 2023-09-12 DIAGNOSIS — M545 Low back pain, unspecified: Secondary | ICD-10-CM

## 2023-09-12 DIAGNOSIS — E559 Vitamin D deficiency, unspecified: Secondary | ICD-10-CM

## 2023-09-12 DIAGNOSIS — I700 Atherosclerosis of aorta: Secondary | ICD-10-CM

## 2023-09-12 DIAGNOSIS — E039 Hypothyroidism, unspecified: Secondary | ICD-10-CM | POA: Diagnosis not present

## 2023-09-12 DIAGNOSIS — E538 Deficiency of other specified B group vitamins: Secondary | ICD-10-CM

## 2023-09-12 DIAGNOSIS — K219 Gastro-esophageal reflux disease without esophagitis: Secondary | ICD-10-CM

## 2023-09-12 DIAGNOSIS — Z9884 Bariatric surgery status: Secondary | ICD-10-CM

## 2023-09-12 MED ORDER — FOLIC ACID 1 MG PO TABS: 1.0000 mg | ORAL_TABLET | Freq: Every day | ORAL | 1 refills | Status: DC

## 2023-09-12 MED ORDER — CYCLOBENZAPRINE HCL 5 MG PO TABS: 5.0000 mg | ORAL_TABLET | Freq: Every day | ORAL | 1 refills | Status: DC

## 2023-09-12 MED ORDER — LEVOTHYROXINE SODIUM 25 MCG PO TABS: 25.0000 ug | ORAL_TABLET | Freq: Every day | ORAL | 1 refills | Status: DC

## 2023-09-12 MED ORDER — OMEPRAZOLE 40 MG PO CPDR
40.0000 mg | DELAYED_RELEASE_CAPSULE | Freq: Every day | ORAL | 1 refills | Status: DC
Start: 2023-09-12 — End: 2024-03-16

## 2023-09-12 NOTE — Progress Notes (Signed)
 Name: Jessica [name]   MRN: [id number]    DOB: [birth year]   Date:09/12/2023       Progress Note  Subjective  Chief Complaint  Chief Complaint  Patient presents with   Medical Management of Chronic Issues   HPI   Pre-diabetes: hgbA1C was back in 2013 was 6.3%, but down to 5.3 % with weight loss , she was gradually gaining weight after bariatric surgery ( 2012 )  and we started her on Metformin 08/2016 , followed by Trulicity but we stopped medication when she lost a lot of weight in 2021, gradually started to gain weight again and we started her back on Ozempic July 2023, weight was  down to 128 lbs, but out of Ozempic since Spring 2024 and weight is now 144 lbs   GERD/history of gastric ulcer: eating smaller portions because it causes stomach pain, taking PPI, she is eating very small portions.    B12 deficiency/Low B12 : she is now getting B12 injections but stopped taking folic acid supplementation , reviewed labs done by Dr. Cathie Hoops .   Bipolar type 1 mixed type: used to see Dr. Elna Breslow, she is now seeing a [facility] and is doing well on current regiment. Taking lithium and seems to be helping her mood. She is very worried about her daughter that has been sick at [facility] for the past month    Adult hypothyroidism:  she has been on medication since 2020, TSH has been at goal. Explained importance of monitoring TSH since she is now on lithium    Hodgkin's lymphoma:she is up to date with visits with hematologist.    Atherosclerosis of aorta/Dyslipidemia : she is on Atorvastatin and denies side effects   Vitamin D deficiency: she is taking supplementation, last level at goal     Patient Active Problem List   Diagnosis Date Noted   Acute metabolic encephalopathy 07/19/2023   Hypokalemia 07/19/2023   Lethargy 07/19/2023   General weakness 07/19/2023   Senile purpura (HCC) 11/20/2022   Folate deficiency 10/30/2022   Weight loss 10/30/2022   ASCUS with positive high risk HPV cervical  05/06/2022   Tubular adenoma of colon 05/06/2022   Iron deficiency anemia 01/09/2018   Cervical high risk HPV (human papillomavirus) test positive 04/08/2017   History of gastric bypass    Ulcer jejunum    Adenoma of left adrenal gland 02/18/2017   Abnormal TSH 08/30/2016   Bipolar disorder (HCC) 12/02/2015   HAV (hallux abducto valgus) 11/22/2015   Bunion of great toe 11/08/2015   Attention-deficit hyperactivity disorder, other type 10/28/2015   Sciatica 04/22/2015   B12 deficiency 12/01/2014   History of bariatric surgery 12/01/2014   Anxiety, generalized 12/01/2014   Gastro-esophageal reflux disease without esophagitis 12/01/2014   H/O elevated lipids 12/01/2014   H/O: HTN (hypertension) 12/01/2014   H/O: obesity 12/01/2014   Hodgkin's disease in remission 12/01/2014   Current tobacco use 12/01/2014   Insomnia 03/24/2008   Abnormal serum level of alkaline phosphatase 11/08/2006   Chronic recurrent major depressive disorder (HCC) 11/08/2006    Past Surgical History:  Procedure Laterality Date   ABDOMINAL HYSTERECTOMY     BUNIONECTOMY Right    CARPAL TUNNEL RELEASE     COLONOSCOPY     multiple   COLONOSCOPY WITH PROPOFOL N/A 11/20/2019   Procedure: COLONOSCOPY WITH PROPOFOL;  Surgeon: Toney Reil, MD;  Location: [facility];  Service: Gastroenterology;  Laterality: N/A;   ESOPHAGOGASTRODUODENOSCOPY (EGD) WITH PROPOFOL N/A 02/21/2017   Procedure:  ESOPHAGOGASTRODUODENOSCOPY (EGD) WITH PROPOFOL;  Surgeon: Midge Minium, MD;  Location: [facility];  Service: Gastroenterology;  Laterality: N/A;  Pre-diabetic   excision of neck mass  09/1998   GASTRIC BYPASS  03/12/2011   LAPAROSCOPIC SUPRACERVICAL HYSTERECTOMY  2008   and unilateral salpingoophorectomy/ endometriosis-Dr Kincius   TONSILLECTOMY      Family History  Problem Relation Age of Onset   Cancer Mother 34       colon   Epilepsy Mother    Multiple myeloma Mother    Glaucoma Father    Breast cancer  Maternal Grandmother 47       inflamitory breast ca    Social History   Tobacco Use   Smoking status: Every Day    Current packs/day: 0.00    Average packs/day: 0.5 packs/day for 20.0 years (10.0 ttl pk-yrs)    Types: Cigarettes, E-cigarettes    Start date: 06/11/2000    Last attempt to quit: 06/03/2020    Years since quitting: 3.2   Smokeless tobacco: Never   Tobacco comments:    Only on e-cigarettes   Substance Use Topics   Alcohol use: No    Alcohol/week: 0.0 standard drinks of alcohol    Comment: rarely     Current Outpatient Medications:    amphetamine-dextroamphetamine (ADDERALL XR) 25 MG 24 hr capsule, Take 1 capsule by mouth every morning., Disp: 30 capsule, Rfl: 0   atorvastatin (LIPITOR) 40 MG tablet, TAKE 1 TABLET BY MOUTH EVERY DAY, Disp: 90 tablet, Rfl: 1   Cholecalciferol (VITAMIN D) 50 MCG (2000 UT) CAPS, Take 1 capsule (2,000 Units total) by mouth daily., Disp: 30 capsule, Rfl: 0   cyanocobalamin (VITAMIN B12) 1000 MCG/ML injection, Inject 1 mL (1,000 mcg total) into the muscle once monthly for 2 months, Disp: 6 mL, Rfl: 0   cyclobenzaprine (FLEXERIL) 5 MG tablet, Take 1 tablet (5 mg total) by mouth at bedtime., Disp: 90 tablet, Rfl: 1   DULoxetine (CYMBALTA) 60 MG capsule, Take 60 mg by mouth daily., Disp: , Rfl:    folic acid (FOLVITE) 1 MG tablet, Take 1 tablet (1 mg total) by mouth daily., Disp: 90 tablet, Rfl: 0   gabapentin (NEURONTIN) 100 MG capsule, Take 2 capsules (200 mg total) by mouth at bedtime., Disp: 180 capsule, Rfl: 0   lithium carbonate (ESKALITH) 450 MG ER tablet, Take 450 mg by mouth at bedtime., Disp: , Rfl:    lithium carbonate (LITHOBID) 300 MG ER tablet, Take 300 mg by mouth at bedtime., Disp: , Rfl:    Multiple Vitamins-Minerals (MULTI-VITAMIN GUMMIES) CHEW, Chew 2 capsules by mouth daily., Disp: 30 tablet, Rfl: 0   Needles & Syringes MISC, Inject 1mL monthly for 2 months, Disp: 2 each, Rfl: 0   omeprazole (PRILOSEC) 40 MG capsule, Take 1  capsule (40 mg total) by mouth daily., Disp: 90 capsule, Rfl: 1   QUEtiapine (SEROQUEL) 400 MG tablet, Take 800 mg by mouth at bedtime., Disp: , Rfl:    traZODone (DESYREL) 100 MG tablet, Take 100-200 mg by mouth at bedtime., Disp: , Rfl:    levothyroxine (SYNTHROID) 25 MCG tablet, Take 1 tablet (25 mcg total) by mouth daily before breakfast., Disp: 90 tablet, Rfl: 1  Allergies  Allergen Reactions   Vicodin [Hydrocodone-Acetaminophen] Other (See Comments)    Has really bad headaches/ migraine      I personally reviewed active problem list, medication list, allergies with the patient/caregiver today.   ROS  Ten systems reviewed and is negative except as  mentioned in HPI    Objective Physical Exam Constitutional: Patient appears well-developed and well-nourished.  No distress.  HEENT: head atraumatic, normocephalic, pupils equal and reactive to light, neck supple Cardiovascular: Normal rate, regular rhythm and normal heart sounds.  No murmur heard. No BLE edema. Pulmonary/Chest: Effort normal and breath sounds normal. No respiratory distress. Abdominal: Soft.  There is no tenderness. Psychiatric: Patient has a normal mood and affect. behavior is normal. Judgment and thought content normal.   Vitals:   09/12/23 0940  BP: 112/74  Pulse: 92  Resp: 16  SpO2: 98%  Weight: 144 lb (65.3 kg)  Height: 5\' 5"  (1.651 m)    Body mass index is 23.96 kg/m.   Diabetic Foot Exam:     PHQ2/9:    09/12/2023    9:39 AM 07/23/2023   10:53 AM 03/22/2023    8:58 AM 02/20/2023    7:42 AM 11/20/2022    9:35 AM  Depression screen PHQ 2/9  Decreased Interest 0 3 3 3 1   Down, Depressed, Hopeless 0 3 3 3 1   PHQ - 2 Score 0 6 6 6 2   Altered sleeping 0 3 3 3 3   Tired, decreased energy 0 3 3 3 3   Change in appetite 0 0 0 3 0  Feeling bad or failure about yourself  0 0 0 3 0  Trouble concentrating 0 3 3 3 1   Moving slowly or fidgety/restless 0 0 0 0 0  Suicidal thoughts 0 0 0 0 0  PHQ-9 Score  0 15 15 21 9   Difficult doing work/chores Not difficult at all Extremely dIfficult  Extremely dIfficult     phq 9 is negative  Fall Risk:    07/23/2023   10:49 AM 03/22/2023    8:58 AM 02/20/2023    7:42 AM 11/20/2022    9:35 AM 05/10/2022   10:28 AM  Fall Risk   Falls in the past year? 0 0 1 1 0  Number falls in past yr: 0 0 0 1 0  Injury with Fall? 0 0 1 0 0  Risk for fall due to : No Fall Risks No Fall Risks No Fall Risks Impaired balance/gait No Fall Risks  Follow up Falls prevention discussed;Education provided;Falls evaluation completed Falls prevention discussed Falls prevention discussed Falls prevention discussed Falls prevention discussed;Education provided;Falls evaluation completed     Assessment & Plan  1. Atherosclerosis of abdominal aorta (HCC) (Primary)  On statin therapy   2. Bipolar 1 disorder, mixed, moderate (HCC)  Going to a [employer], she is taking Lithium and states recent TSH was at goal. She is doing better on current regiment   3. Adult hypothyroidism  - DG Bone Density; Future - levothyroxine (SYNTHROID) 25 MCG tablet; Take 1 tablet (25 mcg total) by mouth daily before breakfast.  Dispense: 90 tablet; Refill: 1  4. Vitamin D deficiency  Taking supplementation  5. Gastro-esophageal reflux disease without esophagitis  - omeprazole (PRILOSEC) 40 MG capsule; Take 1 capsule (40 mg total) by mouth daily.  Dispense: 90 capsule; Refill: 1  6. Hodgkin's disease in remission, unspecified Hodgkin lymphoma type  In remission   7. Folic acid deficiency  - folic acid (FOLVITE) 1 MG tablet; Take 1 tablet (1 mg total) by mouth daily.  Dispense: 90 tablet; Refill: 1  8. B12 deficiency  Getting B12 injections weekly given by hematologist, level still low   9. Screening for colon cancer  - Ambulatory referral to Gastroenterology  10. History of  bariatric surgery  Frustrated about weight gain   11. Ovarian failure  - DG Bone Density;  Future  12. Osteoporosis screening  - DG Bone Density; Future  13. Recurrent low back pain  - cyclobenzaprine (FLEXERIL) 5 MG tablet; Take 1 tablet (5 mg total) by mouth at bedtime.  Dispense: 90 tablet; Refill: 1

## 2023-09-13 ENCOUNTER — Other Ambulatory Visit: Payer: Self-pay

## 2023-09-17 ENCOUNTER — Inpatient Hospital Stay: Attending: Oncology

## 2023-09-17 VITALS — BP 111/61 | HR 105 | Temp 99.4°F | Resp 16

## 2023-09-17 DIAGNOSIS — D509 Iron deficiency anemia, unspecified: Secondary | ICD-10-CM | POA: Insufficient documentation

## 2023-09-17 DIAGNOSIS — E538 Deficiency of other specified B group vitamins: Secondary | ICD-10-CM | POA: Insufficient documentation

## 2023-09-17 MED ORDER — IRON SUCROSE 20 MG/ML IV SOLN
200.0000 mg | Freq: Once | INTRAVENOUS | Status: AC
  Administered 2023-09-17: 200 mg via INTRAVENOUS

## 2023-09-17 MED ORDER — SODIUM CHLORIDE 0.9 % IV SOLN
200.0000 mg | Freq: Once | INTRAVENOUS | Status: DC
  Filled 2023-09-17: qty 10

## 2023-09-17 NOTE — Patient Instructions (Signed)

## 2023-09-24 ENCOUNTER — Inpatient Hospital Stay

## 2023-09-24 VITALS — BP 112/64 | HR 106 | Temp 98.5°F | Resp 18

## 2023-09-24 DIAGNOSIS — D509 Iron deficiency anemia, unspecified: Secondary | ICD-10-CM

## 2023-09-24 MED ORDER — CYANOCOBALAMIN 1000 MCG/ML IJ SOLN
1000.0000 ug | Freq: Once | INTRAMUSCULAR | Status: AC
  Administered 2023-09-24: 1000 ug via INTRAMUSCULAR

## 2023-09-24 MED ORDER — IRON SUCROSE 20 MG/ML IV SOLN
200.0000 mg | Freq: Once | INTRAVENOUS | Status: AC
  Administered 2023-09-24: 200 mg via INTRAVENOUS

## 2023-12-11 ENCOUNTER — Encounter

## 2023-12-11 ENCOUNTER — Ambulatory Visit: Payer: Self-pay

## 2023-12-11 NOTE — Telephone Encounter (Signed)
 FYI Only or Action Required?: FYI only for provider.  Patient was last seen in primary care on 09/12/2023 by Glenard Mire, MD. Called Nurse Triage reporting skin tear looks infected . Symptoms began several weeks ago. Interventions attempted: OTC medications: neosporin and Rest, hydration, or home remedies. Symptoms are: gradually worsening.  Triage Disposition: See Physician Within 24 Hours  Patient/caregiver understands and will follow disposition?: Yes  Copied from CRM [id number]. Topic: Clinical - Red Word Triage >> Dec 11, 2023  4:29 PM Tobias L wrote: Red Word that prompted transfer to Nurse Triage:  Patient has two skin tears on her shin down below her knee. patient has cleaned w/ peroxide, hot water  and soap and covered it w/ neosporin. Patient believes it may be infected, really red all the way around. Reason for Disposition  [1] Looks infected (spreading redness, pus) AND [2] no fever  Answer Assessment - Initial Assessment Questions 1. DESCRIPTION: What does the injury look like?  Is there any part of the skin missing?  If yes, How much of the skin flap is missing?  (25%, 50%, 75%, all)     Skin tear, bubbles, red around edges, warm to touch  2. SIZE: How large is the skin tear?     Large on left shin 3. BLEEDING: Is it bleeding now? If Yes, ask: Is it difficult to stop?     No, draining  4. LOCATION: Where is the skin tear located?   Left shin-one near ankle, one closer to knee.   Requesting abx to be called in  Protocols used: Skin Tear-A-AH

## 2023-12-12 ENCOUNTER — Ambulatory Visit: Admitting: Internal Medicine

## 2023-12-12 ENCOUNTER — Encounter: Payer: Self-pay | Admitting: Internal Medicine

## 2023-12-12 ENCOUNTER — Other Ambulatory Visit: Payer: Self-pay

## 2023-12-12 VITALS — BP 124/72 | HR 100 | Temp 97.8°F | Resp 18 | Ht 67.0 in | Wt 150.5 lb

## 2023-12-12 DIAGNOSIS — E88819 Insulin resistance, unspecified: Secondary | ICD-10-CM

## 2023-12-12 DIAGNOSIS — L03116 Cellulitis of left lower limb: Secondary | ICD-10-CM

## 2023-12-12 LAB — POCT GLYCOSYLATED HEMOGLOBIN (HGB A1C): Hemoglobin A1C: 5.1 % (ref 4.0–5.6)

## 2023-12-12 MED ORDER — DOXYCYCLINE HYCLATE 100 MG PO TABS: 100.0000 mg | ORAL_TABLET | Freq: Two times a day (BID) | ORAL | 0 refills | Status: AC

## 2023-12-12 NOTE — Progress Notes (Signed)
 Acute Office Visit  Subjective:     Patient ID: Jessica [name], female    DOB: 30-Aug-1965, 58 y.o.   MRN: [id number]  Chief Complaint  Patient presents with   Abrasion    Check for infection, lower left leg for 2 weeks    HPI Patient is in today for check of abrasion on left shin. She is a patient of Dr. Glenard and this is my first time meeting her.   Discussed the use of AI scribe software for clinical note transcription with the patient, who gave verbal consent to proceed.  History of Present Illness Jessica [name] is a 58 year old female who presents with concerns of a possible infection following a fall.  Two weeks ago, she fell and sustained a skin tear on her arm. She has been using Neosporin for wound care, but there is white exudate, redness, warmth, and swelling around the area. She is concerned about the risk of infection, especially with an upcoming beach trip. She uses Neosporin and Vaseline to keep the wound moist. She is prediabetic and interested in rechecking her blood sugar levels. Her last A1c in February was normal. No allergies to antibiotics.    Review of Systems  Skin:  Positive for rash.        Objective:    BP 124/72 (Cuff Size: Large)   Pulse 100   Temp 97.8 F (36.6 C) (Oral)   Resp 18   Ht 5' 7 (1.702 m)   Wt 150 lb 8 oz (68.3 kg)   SpO2 98%   BMI 23.57 kg/m    Physical Exam Constitutional:      Appearance: Normal appearance.  HENT:     Head: Normocephalic and atraumatic.  Eyes:     Conjunctiva/sclera: Conjunctivae normal.  Cardiovascular:     Rate and Rhythm: Normal rate and regular rhythm.  Pulmonary:     Effort: Pulmonary effort is normal.     Breath sounds: Normal breath sounds.  Skin:    General: Skin is warm and dry.     Comments: 2 moderately sized skin tears on left anterior shin with redness, warmth and pain, no discharge.   Neurological:     General: No focal deficit present.     Mental Status: She is  alert. Mental status is at baseline.  Psychiatric:        Mood and Affect: Mood normal.        Behavior: Behavior normal.     No results found for any visits on 12/12/23.      Assessment & Plan:   Assessment & Plan Cellulitis Redness, warmth, and swelling indicate cellulitis, likely bacterial. Doxycycline  chosen for MRSA coverage, requires sun protection due to increased sensitivity. - Prescribed doxycycline . - Advised continuation of Neosporin and Vaseline. - Instructed to avoid submerging wound in water . - Advised keeping wound clean and dry, using warm washcloth to dab and pat dry. - Counseled on sun sensitivity with doxycycline , importance of sunscreen and hats.  Hx of Insulin  Resistance Prediabetes with normal A1c in February. Dietary habits, especially sugary drinks, are a concern. Medication unlikely based on A1c. - Perform finger stick glucose test. - Advise reduction of sugar intake, particularly sugary drinks.  - doxycycline  (VIBRA -TABS) 100 MG tablet; Take 1 tablet (100 mg total) by mouth 2 (two) times daily for 5 days.  Dispense: 10 tablet; Refill: 0 - POCT HgB A1C   Return if symptoms worsen or fail to improve.  Sharyle  Bernardo, DO

## 2024-02-19 ENCOUNTER — Emergency Department (HOSPITAL_COMMUNITY)

## 2024-02-19 ENCOUNTER — Emergency Department (HOSPITAL_COMMUNITY)
Admission: EM | Admit: 2024-02-19 | Discharge: 2024-02-19 | Disposition: A | Discharge status: Home / Self Care | Attending: Emergency Medicine | Admitting: Emergency Medicine

## 2024-02-19 ENCOUNTER — Encounter: Payer: Self-pay | Admitting: Oncology

## 2024-02-19 ENCOUNTER — Encounter (HOSPITAL_COMMUNITY): Payer: Self-pay

## 2024-02-19 DIAGNOSIS — S82141A Displaced bicondylar fracture of right tibia, initial encounter for closed fracture: Secondary | ICD-10-CM | POA: Insufficient documentation

## 2024-02-19 DIAGNOSIS — M79661 Pain in right lower leg: Secondary | ICD-10-CM | POA: Diagnosis present

## 2024-02-19 DIAGNOSIS — W108XXA Fall (on) (from) other stairs and steps, initial encounter: Secondary | ICD-10-CM | POA: Diagnosis not present

## 2024-02-19 LAB — I-STAT CHEM 8, ED
BUN: 7 mg/dL (ref 6–20)
Calcium, Ion: 0.98 mmol/L — ABNORMAL LOW (ref 1.15–1.40)
Chloride: 109 mmol/L (ref 98–111)
Creatinine, Ser: 0.6 mg/dL (ref 0.44–1.00)
Glucose, Bld: 83 mg/dL (ref 70–99)
HCT: 28 % — ABNORMAL LOW (ref 36.0–46.0)
Hemoglobin: 9.5 g/dL — ABNORMAL LOW (ref 12.0–15.0)
Potassium: 4.5 mmol/L (ref 3.5–5.1)
Sodium: 140 mmol/L (ref 135–145)
TCO2: 22 mmol/L (ref 22–32)

## 2024-02-19 MED ORDER — MORPHINE SULFATE (PF) 2 MG/ML IV SOLN
2.0000 mg | Freq: Once | INTRAVENOUS | Status: AC
  Administered 2024-02-19: 2 mg via INTRAVENOUS
  Filled 2024-02-19: qty 1

## 2024-02-19 MED ORDER — OXYCODONE-ACETAMINOPHEN 5-325 MG PO TABS
2.0000 | ORAL_TABLET | Freq: Once | ORAL | Status: AC
  Administered 2024-02-19: 2 via ORAL
  Filled 2024-02-19: qty 2

## 2024-02-19 MED ORDER — OXYCODONE-ACETAMINOPHEN 5-325 MG PO TABS: 1.0000 | ORAL_TABLET | Freq: Four times a day (QID) | ORAL | 0 refills | Status: AC | PRN

## 2024-02-19 NOTE — ED Triage Notes (Signed)
 Pt BIB [facility] from home for fall. Pt fell from attic stairs trying to get a box down. Pt fell backwards. Denies hitting head, denies LOC, not on thinners. Pt c/o pain to her bottom and her right knee down. Swelling/bruising noted to left knee. Given 300 mcg fentanyl , 4 mg zofran PTA   150/100

## 2024-02-19 NOTE — Progress Notes (Signed)
 Orthopedic Tech Progress Note Patient Details:  Jessica [name] 10/07/1965 [id number]  Level 2 trauma   Patient ID: Jessica [name], female   DOB: [birth year], 58 y.o.   MRN: [id number]  Jessica [name] 02/19/2024, 10:33 AM

## 2024-02-19 NOTE — Progress Notes (Signed)
 Orthopedic Tech Progress Note Patient Details:  Jessica [name] 1966/03/10 [id number]  Ortho Devices Type of Ortho Device: Crutches, Knee Immobilizer Ortho Device/Splint Location: RLE Ortho Device/Splint Interventions: Ordered, Application, Adjustment   Post Interventions Patient Tolerated: Well, Ambulated well Instructions Provided: Care of device  [name] Pac 02/19/2024, 1:18 PM

## 2024-02-19 NOTE — Consult Note (Signed)
 Reason for Consult:Right tibia plateau fx Referring Physician: Edsel Dade Time called: 1211 Time at bedside: 1216   Jessica [name] is an 58 y.o. female.  HPI: Jessica [name] fell down about 8 attic stairs earlier today. She had immediate right knee pain and could not bear weight. She was brought to the ED where x-rays showed a tibia plateau fx and orthopedic surgery was consulted.   Past Medical History:  Diagnosis Date   ADHD (attention deficit hyperactivity disorder)    Anxiety    Bipolar 1 disorder (HCC)    Cancer (HCC)    Hodgkin's Lymphoma 09/1998   Depression    Endometriosis    GERD (gastroesophageal reflux disease)    History of dysplastic nevus 05/04/2020   right abdomen (side) upper/severe   History of gastric bypass 2012   Hodgkin's lymphoma (HCC) 2000   Hypothyroidism    Pre-diabetes    Tobacco use disorder    Ulcer jejunum     Past Surgical History:  Procedure Laterality Date   ABDOMINAL HYSTERECTOMY     BUNIONECTOMY Right    CARPAL TUNNEL RELEASE     COLONOSCOPY     multiple   COLONOSCOPY WITH PROPOFOL  N/A 11/20/2019   Procedure: COLONOSCOPY WITH PROPOFOL ;  Surgeon: Unk Corinn Skiff, MD;  Location: [facility];  Service: Gastroenterology;  Laterality: N/A;   ESOPHAGOGASTRODUODENOSCOPY (EGD) WITH PROPOFOL  N/A 02/21/2017   Procedure: ESOPHAGOGASTRODUODENOSCOPY (EGD) WITH PROPOFOL ;  Surgeon: Jinny Carmine, MD;  Location: [facility];  Service: Gastroenterology;  Laterality: N/A;  Pre-diabetic   excision of neck mass  09/1998   GASTRIC BYPASS  03/12/2011   LAPAROSCOPIC SUPRACERVICAL HYSTERECTOMY  2008   and unilateral salpingoophorectomy/ endometriosis-Dr Kincius   TONSILLECTOMY      Family History  Problem Relation Age of Onset   Cancer Mother 47       colon   Epilepsy Mother    Multiple myeloma Mother    Glaucoma Father    Breast cancer Maternal Grandmother 61       inflamitory breast ca    Social History:  reports that she has been  smoking cigarettes and e-cigarettes. She started smoking about 23 years ago. She has a 10 pack-year smoking history. She has never used smokeless tobacco. She reports that she does not drink alcohol and does not use drugs.  Allergies:  Allergies  Allergen Reactions   Vicodin [Hydrocodone -Acetaminophen ] Other (See Comments)    Has really bad headaches/ migraine      Medications: I have reviewed the patient's current medications.  Results for orders placed or performed during the hospital encounter of 02/19/24 (from the past 48 hours)  I-stat chem 8, ED (not at [facility], [facility] or [facility])     Status: Abnormal   Collection Time: 02/19/24 10:44 AM  Result Value Ref Range   Sodium 140 135 - 145 mmol/L   Potassium 4.5 3.5 - 5.1 mmol/L   Chloride 109 98 - 111 mmol/L   BUN 7 6 - 20 mg/dL   Creatinine, Ser 9.39 0.44 - 1.00 mg/dL   Glucose, Bld 83 70 - 99 mg/dL    Comment: Glucose reference range applies only to samples taken after fasting for at least 8 hours.   Calcium , Ion 0.98 (L) 1.15 - 1.40 mmol/L   TCO2 22 22 - 32 mmol/L   Hemoglobin 9.5 (L) 12.0 - 15.0 g/dL   HCT 71.9 (L) 63.9 - 53.9 %    CT Knee Right Wo Contrast Result Date: 02/19/2024 CLINICAL DATA:  Trauma after fall. EXAM: CT OF THE RIGHT KNEE WITHOUT CONTRAST TECHNIQUE: Multidetector CT imaging of the right knee was performed according to the standard protocol. Multiplanar CT image reconstructions were also generated. RADIATION DOSE REDUCTION: This exam was performed according to the departmental dose-optimization program which includes automated exposure control, adjustment of the mA and/or kV according to patient size and/or use of iterative reconstruction technique. COMPARISON:  Right tibia and fibula radiographs dated 02/19/2024. FINDINGS: Bones/Joint/Cartilage Comminuted fracture of the lateral tibial plateau with up to 1.4 cm of depression at articular surface. This fracture extends through the proximal lateral tibial metaphysis, the  posterior intercondylar notch, and along the posteromedial tibial plateau without evidence of significant articular surface depression. No additional fracture identified. Large volume lipohemarthrosis. Ligaments Ligaments are suboptimally evaluated by CT. Muscles and Tendons Quadriceps and patellar tendons appear intact. A Baker's cyst is noted with layering hyperdense fluid, which likely represents blood products communicating with the joint space. Soft tissue Prepatellar and anterior shin soft tissue swelling. No radiopaque foreign body. IMPRESSION: 1. Comminuted and depressed fracture of the lateral tibial plateau. This fracture extends through the proximal lateral tibial metaphysis, the posterior tibial intercondylar notch, and along the posteromedial tibial plateau without evidence of significant articular surface depression. 2. Large volume lipohemarthrosis. Electronically Signed   By: Harrietta Sherry M.D.   On: 02/19/2024 12:03   DG Tibia/Fibula Right Result Date: 02/19/2024 CLINICAL DATA:  Status post trauma. EXAM: RIGHT TIBIA AND FIBULA - 2 VIEW COMPARISON:  None Available. FINDINGS: There is an acute fracture of the proximal right tibia. This extends to involve the lateral tibial plateau. There is no evidence of dislocation. A large right knee effusion is noted. IMPRESSION: 1. Acute fracture of the proximal right tibia. 2. Large right knee effusion. Electronically Signed   By: Suzen Dials M.D.   On: 02/19/2024 11:14   DG Femur Min 2 Views Right Result Date: 02/19/2024 CLINICAL DATA:  Status post trauma. EXAM: RIGHT FEMUR 2 VIEWS COMPARISON:  None Available. FINDINGS: An acute fracture deformity is seen involving the proximal right tibia. The right femur is intact. There is no evidence of dislocation. A large joint effusion is seen within the visualized portion of the right knee. IMPRESSION: 1. Acute fracture of the proximal right tibia. 2. Large right knee joint effusion. Electronically Signed    By: Suzen Dials M.D.   On: 02/19/2024 11:06   DG Chest Port 1 View Result Date: 02/19/2024 CLINICAL DATA:  Status post trauma. EXAM: PORTABLE CHEST 1 VIEW COMPARISON:  July 19, 2023 FINDINGS: The heart size and mediastinal contours are within normal limits. Both lungs are clear. The visualized skeletal structures are unremarkable. IMPRESSION: No active disease. Electronically Signed   By: Suzen Dials M.D.   On: 02/19/2024 11:00    Review of Systems  HENT:  Negative for ear discharge, ear pain, hearing loss and tinnitus.   Eyes:  Negative for photophobia and pain.  Respiratory:  Negative for cough and shortness of breath.   Cardiovascular:  Negative for chest pain.  Gastrointestinal:  Negative for abdominal pain, nausea and vomiting.  Genitourinary:  Negative for dysuria, flank pain, frequency and urgency.  Musculoskeletal:  Positive for arthralgias (Right knee). Negative for back pain, myalgias and neck pain.  Neurological:  Negative for dizziness and headaches.  Hematological:  Does not bruise/bleed easily.  Psychiatric/Behavioral:  The patient is not nervous/anxious.    Blood pressure (!) 146/87, pulse 64, temperature 98.1 F (36.7 C), temperature source Oral,  resp. rate 15, height 5' 6 (1.676 m), weight 63.5 kg, SpO2 99%. Physical Exam Constitutional:      General: She is not in acute distress.    Appearance: She is well-developed. She is not diaphoretic.  HENT:     Head: Normocephalic and atraumatic.  Eyes:     General: No scleral icterus.       Right eye: No discharge.        Left eye: No discharge.     Conjunctiva/sclera: Conjunctivae normal.  Cardiovascular:     Rate and Rhythm: Normal rate and regular rhythm.  Pulmonary:     Effort: Pulmonary effort is normal. No respiratory distress.  Musculoskeletal:     Cervical back: Normal range of motion.     Comments: RLE No traumatic wounds, ecchymosis, or rash  Severe TTP knee, mod edema  No ankle  effusion  Sens DPN, SPN, TN intact  Motor EHL, ext, flex, evers 5/5  DP 2+, PT 2+, No significant edema  Skin:    General: Skin is warm and dry.  Neurological:     Mental Status: She is alert.  Psychiatric:        Mood and Affect: Mood normal.        Behavior: Behavior normal.     Assessment/Plan: Right tibia plateau fx -- Offered surgery tomorrow but pt really wants to go home so will place in [place], [facility], and have her f/u with Dr. Kendal or Celena next week to plan surgery.    Ozell DOROTHA Ned, PA-C Orthopedic Surgery [phone] 02/19/2024, 12:27 PM

## 2024-02-19 NOTE — Discharge Instructions (Addendum)
 You broke your right tibia when you fell today.  Please keep knee immobilizer in place.  Do not put any weight on your right leg.  And walk only with the walker or crutches.  Call Dr. Layman office today to make appointment for follow-up next week. Return if you are having any new or worsening problems

## 2024-02-19 NOTE — ED Provider Notes (Signed)
 [place] EMERGENCY DEPARTMENT AT [facility] Provider Note   CSN: [id number] Arrival date & time: 02/19/24  1013     Patient presents with: No chief complaint on file.   Jessica [name] is a 58 y.o. female.   HPI 66 female who fell down attic steps.  She states that she was pulling a box out of the attic while she was on the steps.  The box tipped and pushed her down the steps.  She is complaining of pain in her right lower leg.  She denies any blood thinners, loss of conscious, pain in her head, neck, chest, or back.  She denies any numbness tingling or weakness.  EMS gave her 300 mics of fentanyl  prehospital.  Splint was placed.    Prior to Admission medications   Medication Sig Start Date End Date Taking? Authorizing Provider  oxyCODONE -acetaminophen  (PERCOCET/ROXICET) 5-325 MG tablet Take 1 tablet by mouth every 6 (six) hours as needed for severe pain (pain score 7-10). 02/19/24  Yes Levander Houston, MD  amphetamine -dextroamphetamine  (ADDERALL XR) 25 MG 24 hr capsule Take 1 capsule by mouth every morning. 03/28/23     atorvastatin  (LIPITOR) 40 MG tablet TAKE 1 TABLET BY MOUTH EVERY DAY 05/27/23   Sowles, Krichna, MD  Cholecalciferol (VITAMIN D ) 50 MCG (2000 UT) CAPS Take 1 capsule (2,000 Units total) by mouth daily. 01/03/22   Sowles, Krichna, MD  cyanocobalamin  (VITAMIN B12) 1000 MCG/ML injection Inject 1 mL (1,000 mcg total) into the muscle once monthly for 2 months 03/11/23   Babara Call, MD  cyclobenzaprine  (FLEXERIL ) 5 MG tablet Take 1 tablet (5 mg total) by mouth at bedtime. 09/12/23   Sowles, Krichna, MD  DULoxetine  (CYMBALTA ) 60 MG capsule Take 60 mg by mouth daily.    [provider]  folic acid  (FOLVITE ) 1 MG tablet Take 1 tablet (1 mg total) by mouth daily. 09/12/23   Sowles, Krichna, MD  gabapentin  (NEURONTIN ) 100 MG capsule Take 2 capsules (200 mg total) by mouth at bedtime. 11/20/22   Sowles, Krichna, MD  levothyroxine  (SYNTHROID ) 25 MCG tablet Take 1 tablet (25  mcg total) by mouth daily before breakfast. 09/12/23 09/11/24  Sowles, Krichna, MD  lithium  carbonate (ESKALITH ) 450 MG ER tablet Take 450 mg by mouth at bedtime. 06/21/23   [provider]  lithium  carbonate (LITHOBID ) 300 MG ER tablet Take 300 mg by mouth at bedtime. 06/21/23   [provider]  Multiple Vitamins-Minerals (MULTI-VITAMIN GUMMIES) CHEW Chew 2 capsules by mouth daily. 01/22/17   Sowles, Krichna, MD  Needles & Syringes MISC Inject 1mL monthly for 2 months 12/04/22   Babara Call, MD  omeprazole  (PRILOSEC) 40 MG capsule Take 1 capsule (40 mg total) by mouth daily. 09/12/23   Sowles, Krichna, MD  QUEtiapine  (SEROQUEL ) 400 MG tablet Take 800 mg by mouth at bedtime.    [provider]  traZODone (DESYREL) 100 MG tablet Take 100-200 mg by mouth at bedtime. 07/10/23   [provider]    Allergies: Vicodin [hydrocodone -acetaminophen ]    Review of Systems  Updated Vital Signs BP (!) 146/87   Pulse 64   Temp 98.1 F (36.7 C) (Oral)   Resp 15   Ht 1.676 m (5' 6)   Wt 63.5 kg   SpO2 99%   BMI 22.60 kg/m   Physical Exam Constitutional:      Appearance: Normal appearance.  HENT:     Head: Normocephalic and atraumatic.     Right Ear: External ear normal.  Left Ear: External ear normal.     Nose: Nose normal.     Mouth/Throat:     Pharynx: Oropharynx is clear.  Eyes:     Extraocular Movements: Extraocular movements intact.     Pupils: Pupils are equal, round, and reactive to light.  Cardiovascular:     Rate and Rhythm: Normal rate and regular rhythm.     Pulses: Normal pulses.  Pulmonary:     Effort: Pulmonary effort is normal.     Breath sounds: Normal breath sounds.  Abdominal:     General: Abdomen is flat. Bowel sounds are normal.  Musculoskeletal:        General: Swelling and tenderness present. Normal range of motion.     Cervical back: Normal range of motion.     Comments: Swelling and tenderness around the knee and upper leg.  No  obvious deformity Ankle joint appears intact Pulses are intact in foot Sensation and motor intact in foot Pelvis is stable with no tenderness over hip No obvious deformity or tenderness over right femur  Skin:    General: Skin is warm and dry.     Capillary Refill: Capillary refill takes less than 2 seconds.  Neurological:     General: No focal deficit present.     Mental Status: She is alert.     (all labs ordered are listed, but only abnormal results are displayed) Labs Reviewed  I-STAT CHEM 8, ED - Abnormal; Notable for the following components:      Result Value   Calcium , Ion 0.98 (*)    Hemoglobin 9.5 (*)    HCT 28.0 (*)    All other components within normal limits    EKG: None  Radiology: CT Knee Right Wo Contrast Result Date: 02/19/2024 CLINICAL DATA:  Trauma after fall. EXAM: CT OF THE RIGHT KNEE WITHOUT CONTRAST TECHNIQUE: Multidetector CT imaging of the right knee was performed according to the standard protocol. Multiplanar CT image reconstructions were also generated. RADIATION DOSE REDUCTION: This exam was performed according to the departmental dose-optimization program which includes automated exposure control, adjustment of the mA and/or kV according to patient size and/or use of iterative reconstruction technique. COMPARISON:  Right tibia and fibula radiographs dated 02/19/2024. FINDINGS: Bones/Joint/Cartilage Comminuted fracture of the lateral tibial plateau with up to 1.4 cm of depression at articular surface. This fracture extends through the proximal lateral tibial metaphysis, the posterior intercondylar notch, and along the posteromedial tibial plateau without evidence of significant articular surface depression. No additional fracture identified. Large volume lipohemarthrosis. Ligaments Ligaments are suboptimally evaluated by CT. Muscles and Tendons Quadriceps and patellar tendons appear intact. A Baker's cyst is noted with layering hyperdense fluid, which likely  represents blood products communicating with the joint space. Soft tissue Prepatellar and anterior shin soft tissue swelling. No radiopaque foreign body. IMPRESSION: 1. Comminuted and depressed fracture of the lateral tibial plateau. This fracture extends through the proximal lateral tibial metaphysis, the posterior tibial intercondylar notch, and along the posteromedial tibial plateau without evidence of significant articular surface depression. 2. Large volume lipohemarthrosis. Electronically Signed   By: Harrietta Sherry M.D.   On: 02/19/2024 12:03   DG Tibia/Fibula Right Result Date: 02/19/2024 CLINICAL DATA:  Status post trauma. EXAM: RIGHT TIBIA AND FIBULA - 2 VIEW COMPARISON:  None Available. FINDINGS: There is an acute fracture of the proximal right tibia. This extends to involve the lateral tibial plateau. There is no evidence of dislocation. A large right knee effusion is noted. IMPRESSION: 1.  Acute fracture of the proximal right tibia. 2. Large right knee effusion. Electronically Signed   By: Suzen Dials M.D.   On: 02/19/2024 11:14   DG Femur Min 2 Views Right Result Date: 02/19/2024 CLINICAL DATA:  Status post trauma. EXAM: RIGHT FEMUR 2 VIEWS COMPARISON:  None Available. FINDINGS: An acute fracture deformity is seen involving the proximal right tibia. The right femur is intact. There is no evidence of dislocation. A large joint effusion is seen within the visualized portion of the right knee. IMPRESSION: 1. Acute fracture of the proximal right tibia. 2. Large right knee joint effusion. Electronically Signed   By: Suzen Dials M.D.   On: 02/19/2024 11:06   DG Chest Port 1 View Result Date: 02/19/2024 CLINICAL DATA:  Status post trauma. EXAM: PORTABLE CHEST 1 VIEW COMPARISON:  July 19, 2023 FINDINGS: The heart size and mediastinal contours are within normal limits. Both lungs are clear. The visualized skeletal structures are unremarkable. IMPRESSION: No active disease.  Electronically Signed   By: Suzen Dials M.D.   On: 02/19/2024 11:00     .Critical Care  Performed by: Levander Houston, MD Authorized by: Levander Houston, MD   Critical care provider statement:    Critical care time (minutes):  30   Critical care end time:  02/19/2024 1:10 PM   Critical care time was exclusive of:  Separately billable procedures and treating other patients and teaching time   Critical care was necessary to treat or prevent imminent or life-threatening deterioration of the following conditions:  Trauma   Critical care was time spent personally by me on the following activities:  Development of treatment plan with patient or surrogate, discussions with consultants, evaluation of patient's response to treatment, examination of patient, ordering and review of laboratory studies, ordering and review of radiographic studies, ordering and performing treatments and interventions, pulse oximetry, re-evaluation of patient's condition and review of old charts    Medications Ordered in the ED  morphine  (PF) 2 MG/ML injection 2 mg (2 mg Intravenous Given 02/19/24 1030)  oxyCODONE -acetaminophen  (PERCOCET/ROXICET) 5-325 MG per tablet 2 tablet (2 tablets Oral Given 02/19/24 1229)    Clinical Course as of 02/19/24 1310  Wed Feb 19, 2024  1206 CT reviewed significant for comminuted depressed fracture of the lateral tibial plateau  [DR]    Clinical Course User Index [DR] Levander Houston, MD                                 Medical Decision Making Amount and/or Complexity of Data Reviewed Radiology: ordered.  Risk Prescription drug management.   58 year old female who fell down attic steps today.  Pain is isolated to her right lower leg.  No other signs of trauma were noted on exam.  Patient has been hemodynamically stable.  Imaging reveals tibial plateau fracture.  Care discussed with Ozell Purchase, on-call for orthopedics.  Patient is placed in knee immobilizer and ambulated here  with crutches per their recommendations.  Patient has ambulated here. Plan discharge with the patient to use knee immobilizer at all times.  She has a walker and wheelchair at home.  Plan Rx for pain medicine and refer outpatient for follow-up to Drs. Handy and Haddix     Final diagnoses:  Fall (on) (from) other stairs and steps, initial encounter  Closed fracture of right tibial plateau, initial encounter    ED Discharge Orders  Ordered    oxyCODONE -acetaminophen  (PERCOCET/ROXICET) 5-325 MG tablet  Every 6 hours PRN        02/19/24 1308               Levander Houston, MD 02/19/24 1310

## 2024-02-21 ENCOUNTER — Other Ambulatory Visit: Payer: Self-pay | Admitting: Family Medicine

## 2024-02-21 DIAGNOSIS — E538 Deficiency of other specified B group vitamins: Secondary | ICD-10-CM

## 2024-02-21 DIAGNOSIS — E039 Hypothyroidism, unspecified: Secondary | ICD-10-CM

## 2024-02-25 ENCOUNTER — Other Ambulatory Visit: Payer: Self-pay | Admitting: Family Medicine

## 2024-02-25 DIAGNOSIS — E039 Hypothyroidism, unspecified: Secondary | ICD-10-CM

## 2024-02-25 DIAGNOSIS — E538 Deficiency of other specified B group vitamins: Secondary | ICD-10-CM

## 2024-02-25 DIAGNOSIS — S82121A Displaced fracture of lateral condyle of right tibia, initial encounter for closed fracture: Secondary | ICD-10-CM | POA: Insufficient documentation

## 2024-02-25 DIAGNOSIS — M545 Low back pain, unspecified: Secondary | ICD-10-CM

## 2024-02-27 ENCOUNTER — Ambulatory Visit (HOSPITAL_COMMUNITY): Admit: 2024-02-27 | Admitting: Orthopedic Surgery

## 2024-02-27 SURGERY — OPEN REDUCTION INTERNAL FIXATION (ORIF) TIBIAL PLATEAU
Anesthesia: General | Laterality: Right

## 2024-02-28 HISTORY — PX: ORIF PROXIMAL TIBIAL PLATEAU FRACTURE: SUR953

## 2024-03-09 ENCOUNTER — Inpatient Hospital Stay: Attending: Oncology

## 2024-03-12 ENCOUNTER — Inpatient Hospital Stay: Admitting: Oncology

## 2024-03-16 ENCOUNTER — Telehealth: Payer: Self-pay

## 2024-03-16 DIAGNOSIS — E039 Hypothyroidism, unspecified: Secondary | ICD-10-CM

## 2024-03-16 DIAGNOSIS — K219 Gastro-esophageal reflux disease without esophagitis: Secondary | ICD-10-CM

## 2024-03-16 DIAGNOSIS — E538 Deficiency of other specified B group vitamins: Secondary | ICD-10-CM

## 2024-03-16 MED ORDER — LEVOTHYROXINE SODIUM 25 MCG PO TABS: 25.0000 ug | ORAL_TABLET | Freq: Every day | ORAL | 0 refills | Status: DC

## 2024-03-16 MED ORDER — FOLIC ACID 1 MG PO TABS: 1.0000 mg | ORAL_TABLET | Freq: Every day | ORAL | 0 refills | Status: DC

## 2024-03-16 MED ORDER — OMEPRAZOLE 40 MG PO CPDR: 40.0000 mg | DELAYED_RELEASE_CAPSULE | Freq: Every day | ORAL | 0 refills | Status: DC

## 2024-03-16 NOTE — Telephone Encounter (Signed)
 Refill request on   omeprazole  (PRILOSEC) 40 MG capsule

## 2024-03-16 NOTE — Telephone Encounter (Signed)
 30 day supply given until seen.

## 2024-03-16 NOTE — Telephone Encounter (Signed)
 Refill request on   levothyroxine  (SYNTHROID ) 25 MCG tablet

## 2024-03-16 NOTE — Addendum Note (Signed)
 Addended by: RENTERIA-GARCIA, Vivian Okelley on: 03/16/2024 07:52 AM   Modules accepted: Orders

## 2024-03-16 NOTE — Telephone Encounter (Signed)
 Just filled today enough for 30 days

## 2024-03-16 NOTE — Telephone Encounter (Signed)
 Refill request on   folic acid  (FOLVITE ) 1 MG tablet

## 2024-03-24 NOTE — Patient Instructions (Incomplete)
 Preventive Care 57-57 Years Old, Female  Preventive care refers to lifestyle choices and visits with your health care provider that can promote health and wellness. Preventive care visits are also called wellness exams.  What can I expect for my preventive care visit?  Counseling  Your health care provider may ask you questions about your:  Medical history, including:  Past medical problems.  Family medical history.  Pregnancy history.  Current health, including:  Menstrual cycle.  Method of birth control.  Emotional well-being.  Home life and relationship well-being.  Sexual activity and sexual health.  Lifestyle, including:  Alcohol, nicotine or tobacco, and drug use.  Access to firearms.  Diet, exercise, and sleep habits.  Work and work Astronomer.  Sunscreen use.  Safety issues such as seatbelt and bike helmet use.  Physical exam  Your health care provider will check your:  Height and weight. These may be used to calculate your BMI (body mass index). BMI is a measurement that tells if you are at a healthy weight.  Waist circumference. This measures the distance around your waistline. This measurement also tells if you are at a healthy weight and may help predict your risk of certain diseases, such as type 2 diabetes and high blood pressure.  Heart rate and blood pressure.  Body temperature.  Skin for abnormal spots.  What immunizations do I need?    Vaccines are usually given at various ages, according to a schedule. Your health care provider will recommend vaccines for you based on your age, medical history, and lifestyle or other factors, such as travel or where you work.  What tests do I need?  Screening  Your health care provider may recommend screening tests for certain conditions. This may include:  Lipid and cholesterol levels.  Diabetes screening. This is done by checking your blood sugar (glucose) after you have not eaten for a while (fasting).  Pelvic exam and Pap test.  Hepatitis B test.  Hepatitis C  test.  HIV (human immunodeficiency virus) test.  STI (sexually transmitted infection) testing, if you are at risk.  Lung cancer screening.  Colorectal cancer screening.  Mammogram. Talk with your health care provider about when you should start having regular mammograms. This may depend on whether you have a family history of breast cancer.  BRCA-related cancer screening. This may be done if you have a family history of breast, ovarian, tubal, or peritoneal cancers.  Bone density scan. This is done to screen for osteoporosis.  Talk with your health care provider about your test results, treatment options, and if necessary, the need for more tests.  Follow these instructions at home:  Eating and drinking    Eat a diet that includes fresh fruits and vegetables, whole grains, lean protein, and low-fat dairy products.  Take vitamin and mineral supplements as recommended by your health care provider.  Do not drink alcohol if:  Your health care provider tells you not to drink.  You are pregnant, may be pregnant, or are planning to become pregnant.  If you drink alcohol:  Limit how much you have to 0-1 drink a day.  Know how much alcohol is in your drink. In the [place] one drink equals one 12 oz bottle of beer (355 mL), one 5 oz glass of wine (148 mL), or one 1 oz glass of hard liquor (44 mL).  Lifestyle  Brush your teeth every morning and night with fluoride toothpaste. Floss one time each day.  Exercise for at least  30 minutes 5 or more days each week.  Do not use any products that contain nicotine or tobacco. These products include cigarettes, chewing tobacco, and vaping devices, such as e-cigarettes. If you need help quitting, ask your health care provider.  Do not use drugs.  If you are sexually active, practice safe sex. Use a condom or other form of protection to prevent STIs.  If you do not wish to become pregnant, use a form of birth control. If you plan to become pregnant, see your health care provider for a  prepregnancy visit.  Take aspirin only as told by your health care provider. Make sure that you understand how much to take and what form to take. Work with your health care provider to find out whether it is safe and beneficial for you to take aspirin daily.  Find healthy ways to manage stress, such as:  Meditation, yoga, or listening to music.  Journaling.  Talking to a trusted person.  Spending time with friends and family.  Minimize exposure to UV radiation to reduce your risk of skin cancer.  Safety  Always wear your seat belt while driving or riding in a vehicle.  Do not drive:  If you have been drinking alcohol. Do not ride with someone who has been drinking.  When you are tired or distracted.  While texting.  If you have been using any mind-altering substances or drugs.  Wear a helmet and other protective equipment during sports activities.  If you have firearms in your house, make sure you follow all gun safety procedures.  Seek help if you have been physically or sexually abused.  What's next?  Visit your health care provider once a year for an annual wellness visit.  Ask your health care provider how often you should have your eyes and teeth checked.  Stay up to date on all vaccines.  This information is not intended to replace advice given to you by your health care provider. Make sure you discuss any questions you have with your health care provider.  Document Revised: 11/23/2020 Document Reviewed: 11/23/2020  Elsevier Patient Education  2024 [employer].

## 2024-03-25 ENCOUNTER — Encounter: Payer: Self-pay | Admitting: Family Medicine

## 2024-03-27 ENCOUNTER — Other Ambulatory Visit: Payer: Self-pay | Admitting: Family Medicine

## 2024-03-27 DIAGNOSIS — K219 Gastro-esophageal reflux disease without esophagitis: Secondary | ICD-10-CM

## 2024-03-30 NOTE — Progress Notes (Signed)
 Chief Complaint:  1. Closed fracture of lateral portion of right tibial plateau with routine healing, subsequent encounter  Ambulatory referral to Physical Therapy   oxyCODONE -acetaminophen  (PERCOCET) 5-325 mg per tablet      HPI:  HPI Jessica [name] is a 58 y.o.  female,  who is seen today for  follow-up evaluation of  1. Closed fracture of lateral portion of right tibial plateau with routine healing, subsequent encounter  Ambulatory referral to Physical Therapy   oxyCODONE -acetaminophen  (PERCOCET) 5-325 mg per tablet    .   Jessica [name] is 3 weeks status post surgical management of their fracture(s).   Jessica [name] is accompanied in clinic today by her husband. They are recovering at home.  She has full range of motion.  She is very pleased with her improvement so far.  They have been NWB on the affected limb.   They have been in a soft dressing  Jessica [name] has been compliant with these restrictions. They deny fever, drainage from incisions or splint.   Jessica [name] has been taking ASA BID for DVT prophylaxis   Problem List[1]  Allergies: Allergies[2]  Medications: Home Medications           * aspirin 81 mg chewable tablet    Chew 1 tablet (81 mg total) 2 (two) times a day.   * atorvastatin  (LIPITOR) 40 mg tablet   * cyclobenzaprine  (FLEXERIL ) 5 mg tablet   * dextroamphetamine -amphetamine  (ADDERALL XR) 30 mg 24 hr capsule   * DULoxetine  (CYMBALTA ) 30 mg capsule   * ergocalciferol  (VITAMIN D2) 1,250 mcg (50,000 unit) capsule    Take 1 capsule (50,000 Units total) by mouth once a week.   * folic acid  (FOLVITE ) 1 mg tablet   * gabapentin  (NEURONTIN ) 100 mg capsule   * lactobacillus combination no.4 (Probiotic) 3 billion cell cap   * levothyroxine  (SYNTHROID ) 25 mcg tablet   * lithium  (LITHOBID ) 300 mg ER tablet   * lithium  (LITHOBID ) 450 mg ER tablet   * LORazepam  (ATIVAN ) 1 mg tablet   * methocarbamoL (ROBAXIN) 500 mg tablet   * multivit-iron -folic acid -calcium -mins  (THERA-M) 9 mg iron -400 mcg tablet   * omeprazole  (PriLOSEC) 40 mg DR capsule   * oxyCODONE -acetaminophen  (PERCOCET) 5-325 mg per tablet    Take 1 tablet by mouth every 8 (eight) hours as needed for severe pain (7-10).   * QUEtiapine  (SEROquel ) 400 mg tablet   * traZODone (DESYREL) 100 mg tablet       Review of Systems Constitutional: Patient denies fever, chills, weight loss Respiratory: Patient denies shortness of breath Cardiovascular: Patient denies chest pain  All other systems were reviewed and are negative except as indicated in the HPI   Objective: Vital Signs: There were no vitals filed for this visit.  PHYSICAL EXAM GENERAL: No acute distress.   Well nourished and well hydrated. Appears stated age. PSYCHIATRIC: Normal mood, affect, behavior and judgement HEENT: Normocephalic, atraumatic. Extraocular movements intact.  Mucous membranes moist   NECK: Supple, trachea midline.  RESPIRATORY: Normal respiratory effort and work of breathing  CARDIOVASCULAR: Intact distal pulses SKIN: Warm and dry, no rash. NEUROLOGIC: Alert and oriented.  MUSCULOSKELETAL:  Gait:Not evaluated  RIGHT LOWER EXTREMITY INCISION(S): Clean, dry, intact, well healed.  Sutures in place. INSPECTION: Nml, symmetric, skin intact, no rashes, ulcers or lesions ALIGNMENT: Nml skeletal alignment no deformity PALPATION: Expected TTP about knee, mild ROM: 0 to 120 degrees knee range of motion SENSORY: sensation is intact to light touch  in:  superficial peroneal nerve distribution (over dorsum of foot) deep peroneal nerve distribution (over first dorsal web space) sural nerve distribution (posterior to lateral malleolus) saphenous nerve distribution (over medial malleolus)  MOTOR:  + motor EHL (great toe dorsiflexion) + FHL (great toe plantar flexion)  + TA (ankle dorsiflexion)  + GSC (ankle plantar flexion)  VASCULAR:  toes warm and well-perfused LYMPH: No lymphadenopathy  RADIOLOGY I have  personally reviewed and interpreted the following radiology studies: None  Assessment:  58 y.o. female is 3 weeks status post surgical management of:  1. Closed fracture of lateral portion of right tibial plateau with routine healing, subsequent encounter  Ambulatory referral to Physical Therapy   oxyCODONE -acetaminophen  (PERCOCET) 5-325 mg per tablet      Plan:  -Wound/incision care/instructions:  Sutures removed.  Okay to shower and get incision wet.  Avoid submergence in water  for an additional week. -Weight bearing status: NWB RLE -Precautions/Limitations: full ROM -Work Status: out of work  -PT/OT: Formal physical therapy sent for [facility].  Recommend patient get it scheduled for 2 to 3 weeks from now so that on the next visit she can start partial aggressive weightbearing as long as her x-rays look good. -Pain medication: refilled today.  Percocet 5-3 25 last refill of this medication sent.  Patient has been nearly weaned off. With this narcotic medication prescription, I have discussed at length with the patient the risks and benefits of narcotic use, including, but not limited to addiction, withdrawal symptoms, impairment (including when driving), and death.  I have outlined patient responsibilities, including medication levels screening as requested; amount of prescription refills; and reasons for which drug therapy may be discontinued.  I discussed with patient that the goal is to wean down on the medications and to be off all narcotics by three months post-surgery.  We disused the pain scale, differentiating between pain and discomfort or stretching/resistance.  We discussed only utilizing narcotics when the pain is greater than a 5 on the pain scale.  We discussed supplementing the narcotics with OTC Acetaminophen  (not to exceed 3,500 mg in a 24 hr period).  We discussed a weaning protocol going from Q 4hrs to Q 6 hrs to Q 8 hrs, etc.  They verbalized understanding.   -DVT  prophylaxis: mobilize, EC ASA for 3 more weeks   Patient understands risks/benefits of prophylactic treatment for deep venous thrombosis, including the fact that treatment can lead to increased bleeding, seroma formation, drainage, infection, possibly necessitating further surgery.  However, we have discussed that the risk of DVT leading to possible pulmonary embolus and death outweigh these risks and the pt will continue prophylactic treatment.   -RTC in 3 weeks with XROA 2 view R knee NWB. -All questions were answered to the patient's satisfaction.  Reena Nat Banner, MD       [1] Patient Active Problem List Diagnosis  . Closed fracture of lateral portion of right tibial plateau  . Bipolar disorder  . B12 deficiency  . Chronic recurrent major depressive disorder  . Hypokalemia  . Iron  deficiency anemia  . H/O: HTN (hypertension)  . Abnormal serum level of alkaline phosphatase  [2] Allergies Allergen Reactions  . Hydrocodone -Acetaminophen  Other (See Comments)    Has really bad headaches/ migraine    Has really bad headaches/ migraine  *Some images could not be shown.

## 2024-04-08 ENCOUNTER — Other Ambulatory Visit: Payer: Self-pay | Admitting: Family Medicine

## 2024-04-08 ENCOUNTER — Encounter: Payer: Self-pay | Admitting: Family Medicine

## 2024-04-08 ENCOUNTER — Telehealth (INDEPENDENT_AMBULATORY_CARE_PROVIDER_SITE_OTHER): Admitting: Family Medicine

## 2024-04-08 DIAGNOSIS — F3162 Bipolar disorder, current episode mixed, moderate: Secondary | ICD-10-CM

## 2024-04-08 DIAGNOSIS — E039 Hypothyroidism, unspecified: Secondary | ICD-10-CM

## 2024-04-08 DIAGNOSIS — I700 Atherosclerosis of aorta: Secondary | ICD-10-CM | POA: Diagnosis not present

## 2024-04-08 DIAGNOSIS — C819A Hodgkin lymphoma, unspecified, in remission: Secondary | ICD-10-CM

## 2024-04-08 DIAGNOSIS — K219 Gastro-esophageal reflux disease without esophagitis: Secondary | ICD-10-CM | POA: Diagnosis not present

## 2024-04-08 DIAGNOSIS — Z9884 Bariatric surgery status: Secondary | ICD-10-CM

## 2024-04-08 DIAGNOSIS — M545 Low back pain, unspecified: Secondary | ICD-10-CM

## 2024-04-08 DIAGNOSIS — E538 Deficiency of other specified B group vitamins: Secondary | ICD-10-CM

## 2024-04-08 MED ORDER — OMEPRAZOLE 40 MG PO CPDR: 40.0000 mg | DELAYED_RELEASE_CAPSULE | Freq: Every day | ORAL | 0 refills | Status: AC

## 2024-04-08 MED ORDER — CYCLOBENZAPRINE HCL 5 MG PO TABS: 5.0000 mg | ORAL_TABLET | Freq: Every day | ORAL | 0 refills | Status: AC | PRN

## 2024-04-08 MED ORDER — LEVOTHYROXINE SODIUM 25 MCG PO TABS: 25.0000 ug | ORAL_TABLET | Freq: Every day | ORAL | 0 refills | Status: DC

## 2024-04-08 MED ORDER — ATORVASTATIN CALCIUM 40 MG PO TABS: 40.0000 mg | ORAL_TABLET | Freq: Every day | ORAL | 0 refills | Status: DC

## 2024-04-08 NOTE — Telephone Encounter (Signed)
 Pt has a broken leg and is not able to come in. I did schedule her for today for a virtual visit since it has not been 6 months that she has been in the office.

## 2024-04-08 NOTE — Progress Notes (Signed)
 Name: Jessica [name]   MRN: [id number]    DOB: 12-06-1965   Date:04/08/2024       Progress Note  Subjective  Chief Complaint  Chief Complaint  Patient presents with   Medication Refill    I connected with  Jessica [name]  on 04/08/24 at  3:40 PM EDT by a video enabled telemedicine application and verified that I am speaking with the correct person using two identifiers.  I discussed the limitations of evaluation and management by telemedicine and the availability of in person appointments. The patient expressed understanding and agreed to proceed with the virtual visit  Staff also discussed with the patient that there may be a patient responsible charge related to this service. Patient Location: at home  Provider Location: [facility] Additional Individuals present: alone  Discussed the use of AI scribe software for clinical note transcription with the patient, who gave verbal consent to proceed.  History of Present Illness Jessica [name] is a 58 year old female who presents for follow-up after surgery for a tibial fracture.  She suffered a fall on September 9th while cleaning out her deceased daughter's attic, resulting in a closed fracture of the right tibial plateau. She underwent open reduction and internal fixation surgery on September 19th. Post-surgery, she has been using a wheelchair and has progressed to using a walker. She has not yet started physical therapy.  She has  hypothyroidism, managed with levothyroxine  25 micrograms daily, with no symptoms such as difficulty swallowing or hair loss. Her last blood work in February was normal.  Her gastroesophageal reflux disease is managed with omeprazole , and she reports no recent heartburn or indigestion.  She is under the care of an oncologist for Hodgkin's disease. She was slightly anemic during her last visit in March and plans to reschedule a follow-up appointment in December after canceling in September due to her leg  injury.  She has a history of atherosclerosis and is on atorvastatin , which has effectively lowered her LDL to 53 and improved her HDL to 84.  For bipolar disorder type I, she is under psychiatric care and takes duloxetine  60 mg, lithium  450 mg, lorazepam  1 mg as needed, quetiapine  800 mg, trazodone 200 mg for sleep, and Adderall 30 mg XR. She also takes gabapentin  for mood stabilization.  She was prescribed oxycodone  for pain management post-fracture, initially taking it every four hours but has since reduced to as needed. She also uses cyclobenzaprine  for recurrent lower back spasms, which she takes as needed.  Her daughter, who had scleroderma, passed away on 08-Sep-2025after a prolonged hospitalization and subsequent stroke, which has been a significant emotional event for her.     Patient Active Problem List   Diagnosis Date Noted   Closed fracture of lateral portion of right tibial plateau 02/25/2024   Acute metabolic encephalopathy 07/19/2023   Hypokalemia 07/19/2023   Lethargy 07/19/2023   General weakness 07/19/2023   Senile purpura 11/20/2022   Folate deficiency 10/30/2022   Weight loss 10/30/2022   ASCUS with positive high risk HPV cervical 05/06/2022   Tubular adenoma of colon 05/06/2022   Iron  deficiency anemia 01/09/2018   Cervical high risk HPV (human papillomavirus) test positive 04/08/2017   History of gastric bypass    Ulcer jejunum    Adenoma of left adrenal gland 02/18/2017   Abnormal TSH 08/30/2016   Bipolar disorder (HCC) 12/02/2015   HAV (hallux abducto valgus) 11/22/2015   Bunion of great toe 11/08/2015  Attention-deficit hyperactivity disorder, other type 10/28/2015   Sciatica 04/22/2015   B12 deficiency 12/01/2014   History of bariatric surgery 12/01/2014   Anxiety, generalized 12/01/2014   Gastro-esophageal reflux disease without esophagitis 12/01/2014   H/O elevated lipids 12/01/2014   H/O: HTN (hypertension) 12/01/2014   H/O: obesity 12/01/2014    Hodgkin's disease in remission (HCC) 12/01/2014   Current tobacco use 12/01/2014   Insomnia 03/24/2008   Abnormal serum level of alkaline phosphatase 11/08/2006   Chronic recurrent major depressive disorder 11/08/2006    Past Surgical History:  Procedure Laterality Date   ABDOMINAL HYSTERECTOMY     BUNIONECTOMY Right    CARPAL TUNNEL RELEASE     COLONOSCOPY     multiple   COLONOSCOPY WITH PROPOFOL  N/A 11/20/2019   Procedure: COLONOSCOPY WITH PROPOFOL ;  Surgeon: Unk Corinn Skiff, MD;  Location: [facility];  Service: Gastroenterology;  Laterality: N/A;   ESOPHAGOGASTRODUODENOSCOPY (EGD) WITH PROPOFOL  N/A 02/21/2017   Procedure: ESOPHAGOGASTRODUODENOSCOPY (EGD) WITH PROPOFOL ;  Surgeon: Jinny Carmine, MD;  Location: [facility];  Service: Gastroenterology;  Laterality: N/A;  Pre-diabetic   excision of neck mass  09/1998   GASTRIC BYPASS  03/12/2011   LAPAROSCOPIC SUPRACERVICAL HYSTERECTOMY  2008   and unilateral salpingoophorectomy/ endometriosis-Dr Kincius   ORIF PROXIMAL TIBIAL PLATEAU FRACTURE Right 02/28/2024   TONSILLECTOMY      Family History  Problem Relation Age of Onset   Cancer Mother 15       colon   Epilepsy Mother    Multiple myeloma Mother    Glaucoma Father    Breast cancer Maternal Grandmother 3       inflamitory breast ca   Scleroderma Daughter     Social History   Socioeconomic History   Marital status: Married    Spouse name: educational psychologist   Number of children: 2   Years of education: Not on file   Highest education level: Associate degree: occupational, scientist, product/process development, or vocational program  Occupational History   Occupation: house wife   Tobacco Use   Smoking status: Every Day    Current packs/day: 0.00    Average packs/day: 0.5 packs/day for 20.0 years (10.0 ttl pk-yrs)    Types: Cigarettes, E-cigarettes    Start date: 06/11/2000    Last attempt to quit: 06/03/2020    Years since quitting: 3.8   Smokeless tobacco: Never   Tobacco comments:     Only on e-cigarettes   Vaping Use   Vaping status: Some Days  Substance and Sexual Activity   Alcohol use: No    Alcohol/week: 0.0 standard drinks of alcohol    Comment: rarely   Drug use: No   Sexual activity: Yes    Partners: Male    Birth control/protection: Surgical  Other Topics Concern   Not on file  Social History Narrative   Not on file   Social Drivers of Health   Financial Resource Strain: Low Risk  (03/22/2023)   Overall Financial Resource Strain (CARDIA)    Difficulty of Paying Living Expenses: Not hard at all  Food Insecurity: No Food Insecurity (03/22/2023)   Hunger Vital Sign    Worried About Running Out of Food in the Last Year: Never true    Ran Out of Food in the Last Year: Never true  Transportation Needs: No Transportation Needs (03/22/2023)   PRAPARE - Administrator, Civil Service (Medical): No    Lack of Transportation (Non-Medical): No  Physical Activity: Insufficiently Active (03/22/2023)   Exercise Vital Sign  Days of Exercise per Week: 2 days    Minutes of Exercise per Session: 30 min  Stress: No Stress Concern Present (03/22/2023)   [employer] of Occupational Health - Occupational Stress Questionnaire    Feeling of Stress : Not at all  Social Connections: Moderately Integrated (03/22/2023)   Social Connection and Isolation Panel    Frequency of Communication with Friends and Family: More than three times a week    Frequency of Social Gatherings with Friends and Family: More than three times a week    Attends Religious Services: More than 4 times per year    Active Member of [employer] or Organizations: No    Attends Banker Meetings: Never    Marital Status: Married  Catering Manager Violence: Not At Risk (03/22/2023)   Humiliation, Afraid, Rape, and Kick questionnaire    Fear of Current or Ex-Partner: No    Emotionally Abused: No    Physically Abused: No    Sexually Abused: No     Current Outpatient  Medications:    amphetamine -dextroamphetamine  (ADDERALL XR) 30 MG 24 hr capsule, Take 30 mg by mouth every morning., Disp: , Rfl:    DULoxetine  (CYMBALTA ) 30 MG capsule, Take 60 mg by mouth daily., Disp: , Rfl:    folic acid  (FOLVITE ) 1 MG tablet, Take 1 tablet (1 mg total) by mouth daily., Disp: 30 tablet, Rfl: 0   gabapentin  (NEURONTIN ) 100 MG capsule, Take 2 capsules (200 mg total) by mouth at bedtime., Disp: 180 capsule, Rfl: 0   lithium  carbonate (ESKALITH ) 450 MG ER tablet, Take 450 mg by mouth See admin instructions. Take with 300 mg for a total of 750 mg in the morning, Disp: , Rfl:    lithium  carbonate (LITHOBID ) 300 MG ER tablet, Take 300 mg by mouth See admin instructions. Take with 450 mg for a total of 750 mg in the morning, Disp: , Rfl:    LORazepam  (ATIVAN ) 1 MG tablet, Take 1 mg by mouth daily as needed for anxiety., Disp: , Rfl:    Multiple Vitamins-Minerals (MULTI-VITAMIN GUMMIES) CHEW, Chew 2 capsules by mouth daily., Disp: 30 tablet, Rfl: 0   Needles & Syringes MISC, Inject 1mL monthly for 2 months, Disp: 2 each, Rfl: 0   oxyCODONE -acetaminophen  (PERCOCET/ROXICET) 5-325 MG tablet, Take 1 tablet by mouth every 6 (six) hours as needed for severe pain (pain score 7-10)., Disp: 15 tablet, Rfl: 0   Probiotic Product (PROBIOTIC PO), Take 1 tablet by mouth daily at 12 noon. Gummy, Disp: , Rfl:    QUEtiapine  (SEROQUEL ) 400 MG tablet, Take 800 mg by mouth at bedtime., Disp: , Rfl:    traZODone (DESYREL) 100 MG tablet, Take 100-200 mg by mouth at bedtime., Disp: , Rfl:    atorvastatin  (LIPITOR) 40 MG tablet, Take 1 tablet (40 mg total) by mouth daily., Disp: 90 tablet, Rfl: 0   cyanocobalamin  (VITAMIN B12) 1000 MCG/ML injection, Inject 1 mL (1,000 mcg total) into the muscle once monthly for 2 months (Patient not taking: Reported on 02/24/2024), Disp: 6 mL, Rfl: 0   cyclobenzaprine  (FLEXERIL ) 5 MG tablet, Take 1 tablet (5 mg total) by mouth daily as needed for muscle spasms., Disp: 90 tablet,  Rfl: 0   levothyroxine  (SYNTHROID ) 25 MCG tablet, Take 1 tablet (25 mcg total) by mouth daily before breakfast., Disp: 90 tablet, Rfl: 0   omeprazole  (PRILOSEC) 40 MG capsule, Take 1 capsule (40 mg total) by mouth daily., Disp: 90 capsule, Rfl: 0  Allergies  Allergen  Reactions   Vicodin [Hydrocodone -Acetaminophen ] Other (See Comments)    Has really bad headaches/ migraine      I personally reviewed active problem list, medication list, allergies with the patient/caregiver today.   ROS  Ten systems reviewed and is negative except as mentioned in HPI    Objective   Physical Exam  Awake, alert and oriented   PHQ2/9:    09/12/2023    9:39 AM 07/23/2023   10:53 AM 03/22/2023    8:58 AM 02/20/2023    7:42 AM 11/20/2022    9:35 AM  Depression screen PHQ 2/9  Decreased Interest 0 3 3 3 1   Down, Depressed, Hopeless 0 3 3 3 1   PHQ - 2 Score 0 6 6 6 2   Altered sleeping 0 3 3 3 3   Tired, decreased energy 0 3 3 3 3   Change in appetite 0 0 0 3 0  Feeling bad or failure about yourself  0 0 0 3 0  Trouble concentrating 0 3 3 3 1   Moving slowly or fidgety/restless 0 0 0 0 0  Suicidal thoughts 0 0 0 0 0  PHQ-9 Score 0 15 15 21 9   Difficult doing work/chores Not difficult at all Extremely dIfficult  Extremely dIfficult    PHQ-2/9 Result is negative.    Fall Risk:    07/23/2023   10:49 AM 03/22/2023    8:58 AM 02/20/2023    7:42 AM 11/20/2022    9:35 AM 05/10/2022   10:28 AM  Fall Risk   Falls in the past year? 0 0 1 1 0  Number falls in past yr: 0 0 0 1 0  Injury with Fall? 0 0 1 0 0  Risk for fall due to : No Fall Risks No Fall Risks No Fall Risks Impaired balance/gait No Fall Risks  Follow up Falls prevention discussed;Education provided;Falls evaluation completed Falls prevention discussed Falls prevention discussed Falls prevention discussed Falls prevention discussed;Education provided;Falls evaluation completed      Data saved with a previous flowsheet row definition       Assessment & Plan Closed fracture of right tibial plateau, post-ORIF Status post ORIF of right lateral tibial plateau fracture. Non-weight bearing to prevent joint collapse. Awaiting physical therapy post-suture removal. - Continue non-weight bearing status until cleared by orthopedic surgeon. - Follow up with orthopedic surgeon in three weeks for evaluation and potential initiation of physical therapy. - Monitor for signs of complications such as infection or hardware failure.  Low back pain with recurrent muscle spasms Recurrent low back pain with muscle spasms managed with cyclobenzaprine . - Prescribe cyclobenzaprine  for muscle spasms as needed.  Bipolar disorder, current episode mixed, moderate Bipolar disorder, mixed episode, moderate. Current medication regimen effective. - Continue current psychiatric medication regimen. - Continue follow-up with psychiatrist.  Hodgkin lymphoma in remission, under surveillance Hodgkin lymphoma in remission. Missed September follow-up due to leg fracture. - Contact oncologist to reschedule follow-up appointment and lab work for December.  Hypothyroidism Hypothyroidism on levothyroxine  25 mcg daily. Last thyroid  function tests were normal. - Prescribe 90-day supply of levothyroxine  25 mcg. - Plan for follow-up thyroid  function tests at next visit.  Atherosclerosis of aorta Atherosclerosis of the aorta managed with atorvastatin . LDL and HDL well-controlled. - Continue atorvastatin  as prescribed. - Plan for lipid panel at next visit.  Gastroesophageal reflux disease (GERD) GERD well-controlled on omeprazole . - Continue omeprazole  as prescribed.  Deficiency of B group vitamins, unspecified Previously on B12 injections, current supplementation status unclear. - Clarify  B12 supplementation plan with oncologist at next visit.      I discussed the assessment and treatment plan with the patient. The patient was provided an opportunity  to ask questions and all were answered. The patient agreed with the plan and demonstrated an understanding of the instructions.  The patient was advised to call back or seek an in-person evaluation if the symptoms worsen or if the condition fails to improve as anticipated.  I provided 25 minutes of non-face-to-face time during this encounter.

## 2024-04-08 NOTE — Telephone Encounter (Signed)
 Unfortunately Dr Glenard you have no open acute slots for this week. Please advise

## 2024-04-12 ENCOUNTER — Other Ambulatory Visit: Payer: Self-pay | Admitting: Family Medicine

## 2024-04-12 DIAGNOSIS — E538 Deficiency of other specified B group vitamins: Secondary | ICD-10-CM

## 2024-05-07 ENCOUNTER — Other Ambulatory Visit: Payer: Self-pay | Admitting: Family Medicine

## 2024-05-07 DIAGNOSIS — E538 Deficiency of other specified B group vitamins: Secondary | ICD-10-CM

## 2024-05-11 ENCOUNTER — Encounter: Payer: Self-pay | Admitting: Family Medicine

## 2024-05-12 ENCOUNTER — Other Ambulatory Visit: Payer: Self-pay | Admitting: Family Medicine

## 2024-05-12 MED ORDER — NYSTATIN-TRIAMCINOLONE 100000-0.1 UNIT/GM-% EX OINT: 1.0000 | TOPICAL_OINTMENT | Freq: Two times a day (BID) | CUTANEOUS | 0 refills | Status: AC

## 2024-05-12 NOTE — Telephone Encounter (Signed)
 Requested Prescriptions  Pending Prescriptions Disp Refills   folic acid  (FOLVITE ) 1 MG tablet [Pharmacy Med Name: FOLIC ACID  1 MG TABLET] 90 tablet 1    Sig: TAKE 1 TABLET BY MOUTH EVERY DAY     Endocrinology:  Vitamins Passed - 05/12/2024 11:51 AM      Passed - Valid encounter within last 12 months    Recent Outpatient Visits           1 month ago Adult hypothyroidism   [facility] Glenard Mire, MD   5 months ago Cellulitis of left lower extremity   [facility] Bernardo Fend, DO   8 months ago Atherosclerosis of abdominal aorta   [facility] Glenard Mire, MD   9 months ago E. coli urinary tract infection   [facility] Sowles, Krichna, MD

## 2024-07-10 ENCOUNTER — Other Ambulatory Visit: Payer: Self-pay | Admitting: Family Medicine

## 2024-07-10 DIAGNOSIS — E039 Hypothyroidism, unspecified: Secondary | ICD-10-CM

## 2024-07-10 DIAGNOSIS — I700 Atherosclerosis of aorta: Secondary | ICD-10-CM

## 2024-07-10 NOTE — Telephone Encounter (Signed)
 Requested medications are due for refill today.  yes  Requested medications are on the active medications list.  yes  Last refill. 04/08/2024 #90 0 rf  Future visit scheduled.   no  Notes to clinic.  Expired labs.    Requested Prescriptions  Pending Prescriptions Disp Refills   atorvastatin  (LIPITOR) 40 MG tablet [Pharmacy Med Name: ATORVASTATIN  40 MG TABLET] 90 tablet 0    Sig: TAKE 1 TABLET BY MOUTH EVERY DAY     Cardiovascular:  Antilipid - Statins Failed - 07/10/2024  4:20 PM      Failed - Lipid Panel in normal range within the last 12 months    Cholesterol  Date Value Ref Range Status  02/20/2023 153 <200 mg/dL Final  98/71/7984 878 0 - 200 mg/dL Final   Ldl Cholesterol, Calc  Date Value Ref Range Status  07/08/2013 53 0 - 100 mg/dL Final   LDL Cholesterol (Calc)  Date Value Ref Range Status  02/20/2023 53 mg/dL (calc) Final    Comment:    Reference range: <100 . Desirable range <100 mg/dL for primary prevention;   <70 mg/dL for patients with CHD or diabetic patients  with > or = 2 CHD risk factors. SABRA LDL-C is now calculated using the Martin-Hopkins  calculation, which is a validated novel method providing  better accuracy than the Friedewald equation in the  estimation of LDL-C.  Gladis APPLETHWAITE et al. SANDREA. 7986;689(80): 2061-2068  (http://education.[facility].com/faq/FAQ164)    HDL Cholesterol  Date Value Ref Range Status  07/08/2013 42 40 - 60 mg/dL Final   HDL  Date Value Ref Range Status  02/20/2023 84 > OR = 50 mg/dL Final   Triglycerides  Date Value Ref Range Status  02/20/2023 80 <150 mg/dL Final  98/71/7984 867 0 - 200 mg/dL Final         Passed - Patient is not pregnant      Passed - Valid encounter within last 12 months    Recent Outpatient Visits           3 months ago Adult hypothyroidism   [facility] Glenard Mire, MD   7 months ago Cellulitis of left lower extremity   [facility] Bernardo Fend, DO   10 months ago Atherosclerosis of abdominal aorta   [facility] Glenard Mire, MD   11 months ago E. coli urinary tract infection   [facility], Mire, MD              Signed Prescriptions Disp Refills   levothyroxine  (SYNTHROID ) 25 MCG tablet 30 tablet 0    Sig: TAKE 1 TABLET BY MOUTH DAILY BEFORE BREAKFAST.     Endocrinology:  Hypothyroid Agents Passed - 07/10/2024  4:20 PM      Passed - TSH in normal range and within 360 days    TSH  Date Value Ref Range Status  07/19/2023 2.663 0.350 - 4.500 uIU/mL Final    Comment:    Performed by a 3rd Generation assay with a functional sensitivity of <=0.01 uIU/mL. Performed at [facility], [address] [place], [place] [address]   02/20/2023 2.12 0.40 - 4.50 mIU/L Final         Passed - Valid encounter within last 12 months    Recent Outpatient Visits           3 months ago Adult hypothyroidism   [facility], Krichna,  MD   7 months ago Cellulitis of left lower extremity   [facility] Bernardo Fend, DO   10 months ago Atherosclerosis of abdominal aorta   [facility] Glenard Mire, MD   11 months ago E. coli urinary tract infection   [facility] Sowles, Krichna, MD
